# Patient Record
Sex: Male | Born: 1945 | Race: White | Hispanic: No | Marital: Married | Smoking: Former smoker
Health system: Southern US, Community
[De-identification: ages and names within clinical notes are randomized; demographics above are authoritative.]

## PROBLEM LIST (undated history)

## (undated) DIAGNOSIS — Z8619 Personal history of other infectious and parasitic diseases: Secondary | ICD-10-CM

## (undated) DIAGNOSIS — Z9289 Personal history of other medical treatment: Secondary | ICD-10-CM

## (undated) DIAGNOSIS — E7800 Pure hypercholesterolemia, unspecified: Secondary | ICD-10-CM

## (undated) DIAGNOSIS — K219 Gastro-esophageal reflux disease without esophagitis: Secondary | ICD-10-CM

## (undated) DIAGNOSIS — H3320 Serous retinal detachment, unspecified eye: Secondary | ICD-10-CM

## (undated) DIAGNOSIS — H35039 Hypertensive retinopathy, unspecified eye: Secondary | ICD-10-CM

## (undated) DIAGNOSIS — M1990 Unspecified osteoarthritis, unspecified site: Secondary | ICD-10-CM

## (undated) DIAGNOSIS — I10 Essential (primary) hypertension: Secondary | ICD-10-CM

## (undated) DIAGNOSIS — G459 Transient cerebral ischemic attack, unspecified: Secondary | ICD-10-CM

## (undated) DIAGNOSIS — I639 Cerebral infarction, unspecified: Secondary | ICD-10-CM

## (undated) HISTORY — PX: SHOULDER ARTHROSCOPY WITH OPEN ROTATOR CUFF REPAIR: SHX6092

## (undated) HISTORY — PX: EYE SURGERY: SHX253

## (undated) HISTORY — DX: Cerebral infarction, unspecified: I63.9

## (undated) HISTORY — DX: Serous retinal detachment, unspecified eye: H33.20

## (undated) HISTORY — DX: Hypertensive retinopathy, unspecified eye: H35.039

## (undated) HISTORY — DX: Personal history of other infectious and parasitic diseases: Z86.19

## (undated) HISTORY — PX: ESOPHAGOGASTRODUODENOSCOPY (EGD) WITH ESOPHAGEAL DILATION: SHX5812

## (undated) HISTORY — PX: CATARACT EXTRACTION W/ INTRAOCULAR LENS IMPLANT: SHX1309

## (undated) HISTORY — PX: BACK SURGERY: SHX140

## (undated) HISTORY — PX: TONSILLECTOMY: SUR1361

---

## 1994-11-14 DIAGNOSIS — K219 Gastro-esophageal reflux disease without esophagitis: Secondary | ICD-10-CM | POA: Insufficient documentation

## 1994-11-14 DIAGNOSIS — I10 Essential (primary) hypertension: Secondary | ICD-10-CM | POA: Insufficient documentation

## 1997-12-23 ENCOUNTER — Observation Stay (HOSPITAL_COMMUNITY): Admission: RE | Admit: 1997-12-23 | Discharge: 1997-12-24 | Payer: Self-pay | Admitting: Orthopedic Surgery

## 2001-09-17 DIAGNOSIS — J309 Allergic rhinitis, unspecified: Secondary | ICD-10-CM | POA: Insufficient documentation

## 2004-08-01 ENCOUNTER — Emergency Department: Payer: Self-pay | Admitting: Emergency Medicine

## 2005-08-22 ENCOUNTER — Ambulatory Visit: Payer: Self-pay | Admitting: General Surgery

## 2005-08-22 LAB — HM COLONOSCOPY: HM Colonoscopy: NORMAL

## 2006-09-26 DIAGNOSIS — M25559 Pain in unspecified hip: Secondary | ICD-10-CM | POA: Insufficient documentation

## 2007-03-28 ENCOUNTER — Ambulatory Visit: Payer: Self-pay | Admitting: Urology

## 2007-08-13 DIAGNOSIS — F40243 Fear of flying: Secondary | ICD-10-CM | POA: Insufficient documentation

## 2007-08-13 DIAGNOSIS — Z8249 Family history of ischemic heart disease and other diseases of the circulatory system: Secondary | ICD-10-CM | POA: Insufficient documentation

## 2009-06-21 ENCOUNTER — Ambulatory Visit: Payer: Self-pay | Admitting: Neurological Surgery

## 2010-08-22 ENCOUNTER — Emergency Department: Payer: Self-pay | Admitting: Emergency Medicine

## 2011-11-30 ENCOUNTER — Ambulatory Visit: Payer: Self-pay | Admitting: Gastroenterology

## 2011-12-28 ENCOUNTER — Ambulatory Visit: Payer: Self-pay | Admitting: Physical Medicine and Rehabilitation

## 2012-04-11 DIAGNOSIS — Z9289 Personal history of other medical treatment: Secondary | ICD-10-CM

## 2012-04-11 HISTORY — DX: Personal history of other medical treatment: Z92.89

## 2012-06-29 ENCOUNTER — Other Ambulatory Visit: Payer: Self-pay | Admitting: Neurological Surgery

## 2012-06-29 DIAGNOSIS — M549 Dorsalgia, unspecified: Secondary | ICD-10-CM

## 2012-07-05 ENCOUNTER — Ambulatory Visit
Admission: RE | Admit: 2012-07-05 | Discharge: 2012-07-05 | Disposition: A | Discharge status: Home / Self Care | Payer: 59 | Source: Ambulatory Visit | Attending: Neurological Surgery | Admitting: Neurological Surgery

## 2012-07-05 VITALS — BP 175/71 | HR 69 | Ht 68.0 in | Wt 195.0 lb

## 2012-07-05 DIAGNOSIS — M549 Dorsalgia, unspecified: Secondary | ICD-10-CM

## 2012-07-05 MED ORDER — IOHEXOL 180 MG/ML  SOLN
15.0000 mL | Freq: Once | INTRAMUSCULAR | Status: AC | PRN
  Administered 2012-07-05: 15 mL via INTRATHECAL

## 2012-07-05 MED ORDER — DIAZEPAM 5 MG PO TABS
10.0000 mg | ORAL_TABLET | Freq: Once | ORAL | Status: AC
  Administered 2012-07-05: 10 mg via ORAL

## 2012-07-23 ENCOUNTER — Encounter (HOSPITAL_COMMUNITY): Payer: Self-pay | Admitting: Respiratory Therapy

## 2012-07-23 ENCOUNTER — Other Ambulatory Visit: Payer: Self-pay | Admitting: Neurological Surgery

## 2012-07-24 ENCOUNTER — Ambulatory Visit (HOSPITAL_COMMUNITY)
Admission: RE | Admit: 2012-07-24 | Discharge: 2012-07-24 | Disposition: A | Discharge status: Home / Self Care | Payer: 59 | Source: Ambulatory Visit | Attending: Neurological Surgery | Admitting: Neurological Surgery

## 2012-07-24 ENCOUNTER — Encounter (HOSPITAL_COMMUNITY): Payer: Self-pay

## 2012-07-24 ENCOUNTER — Encounter (HOSPITAL_COMMUNITY)
Admission: RE | Admit: 2012-07-24 | Discharge: 2012-07-24 | Disposition: A | Discharge status: Home / Self Care | Payer: 59 | Source: Ambulatory Visit | Attending: Neurological Surgery | Admitting: Neurological Surgery

## 2012-07-24 DIAGNOSIS — I10 Essential (primary) hypertension: Secondary | ICD-10-CM | POA: Insufficient documentation

## 2012-07-24 DIAGNOSIS — Z01818 Encounter for other preprocedural examination: Secondary | ICD-10-CM | POA: Insufficient documentation

## 2012-07-24 DIAGNOSIS — Z01810 Encounter for preprocedural cardiovascular examination: Secondary | ICD-10-CM | POA: Insufficient documentation

## 2012-07-24 DIAGNOSIS — M479 Spondylosis, unspecified: Secondary | ICD-10-CM | POA: Insufficient documentation

## 2012-07-24 DIAGNOSIS — Z01812 Encounter for preprocedural laboratory examination: Secondary | ICD-10-CM | POA: Insufficient documentation

## 2012-07-24 HISTORY — DX: Unspecified osteoarthritis, unspecified site: M19.90

## 2012-07-24 HISTORY — DX: Essential (primary) hypertension: I10

## 2012-07-24 LAB — CBC
HCT: 42.2 % (ref 39.0–52.0)
Hemoglobin: 15.1 g/dL (ref 13.0–17.0)
MCH: 30.4 pg (ref 26.0–34.0)
MCHC: 35.8 g/dL (ref 30.0–36.0)
MCV: 84.9 fL (ref 78.0–100.0)
Platelets: 177 10*3/uL (ref 150–400)
RBC: 4.97 MIL/uL (ref 4.22–5.81)
RDW: 12.1 % (ref 11.5–15.5)
WBC: 7.9 10*3/uL (ref 4.0–10.5)

## 2012-07-24 LAB — BASIC METABOLIC PANEL
BUN: 18 mg/dL (ref 6–23)
CO2: 26 mEq/L (ref 19–32)
Calcium: 9.5 mg/dL (ref 8.4–10.5)
Chloride: 103 mEq/L (ref 96–112)
Creatinine, Ser: 1.1 mg/dL (ref 0.50–1.35)
GFR calc Af Amer: 80 mL/min — ABNORMAL LOW (ref >90)
GFR calc non Af Amer: 69 mL/min — ABNORMAL LOW (ref >90)
Glucose, Bld: 114 mg/dL — ABNORMAL HIGH (ref 70–99)
Potassium: 4.7 mEq/L (ref 3.5–5.1)
Sodium: 137 mEq/L (ref 135–145)

## 2012-07-24 LAB — SURGICAL PCR SCREEN
MRSA, PCR: NEGATIVE
Staphylococcus aureus: NEGATIVE

## 2012-07-24 NOTE — Pre-Procedure Instructions (Signed)
Garrett Green  07/24/2012   Your procedure is scheduled on:  Thursday  07/26/12   Report to [facility] at 530 AM.  Call this number if you have problems the morning of surgery: [phone]   Remember:   Do not eat food or drink liquids after midnight.   Take these medicines the morning of surgery with A SIP OF WATER:  NEURONTIN, HYDROCODONE IF NEEDED   Do not wear jewelry, make-up or nail polish.  Do not wear lotions, powders, or perfumes. You may wear deodorant.  Do not shave 48 hours prior to surgery. Men may shave face and neck.  Do not bring valuables to the hospital.  Contacts, dentures or bridgework may not be worn into surgery.  Leave suitcase in the car. After surgery it may be brought to your room.  For patients admitted to the hospital, checkout time is 11:00 AM the day of  discharge.   Patients discharged the day of surgery will not be allowed to drive  home.  Name and phone number of your driver:   Special Instructions: Shower using CHG 2 nights before surgery and the night before surgery.  If you shower the day of surgery use CHG.  Use special wash - you have one bottle of CHG for all showers.  You should use approximately 1/3 of the bottle for each shower.   Please read over the following fact sheets that you were given: Pain Booklet, Coughing and Deep Breathing, MRSA Information and Surgical Site Infection Prevention

## 2012-07-26 ENCOUNTER — Encounter (HOSPITAL_COMMUNITY)
Admission: RE | Disposition: A | Discharge status: Home / Self Care | Payer: Self-pay | Source: Ambulatory Visit | Attending: Neurological Surgery

## 2012-07-26 ENCOUNTER — Ambulatory Visit (HOSPITAL_COMMUNITY)
Admission: RE | Admit: 2012-07-26 | Discharge: 2012-07-26 | Disposition: A | Discharge status: Home / Self Care | Payer: 59 | Source: Ambulatory Visit | Attending: Neurological Surgery | Admitting: Neurological Surgery

## 2012-07-26 ENCOUNTER — Ambulatory Visit (HOSPITAL_COMMUNITY): Payer: 59 | Admitting: Certified Registered"

## 2012-07-26 ENCOUNTER — Encounter (HOSPITAL_COMMUNITY): Payer: Self-pay | Admitting: Certified Registered"

## 2012-07-26 ENCOUNTER — Encounter (HOSPITAL_COMMUNITY): Payer: Self-pay | Admitting: *Deleted

## 2012-07-26 ENCOUNTER — Ambulatory Visit (HOSPITAL_COMMUNITY): Payer: 59

## 2012-07-26 DIAGNOSIS — M5137 Other intervertebral disc degeneration, lumbosacral region: Secondary | ICD-10-CM | POA: Insufficient documentation

## 2012-07-26 DIAGNOSIS — Z9889 Other specified postprocedural states: Secondary | ICD-10-CM

## 2012-07-26 DIAGNOSIS — I10 Essential (primary) hypertension: Secondary | ICD-10-CM | POA: Insufficient documentation

## 2012-07-26 DIAGNOSIS — M48061 Spinal stenosis, lumbar region without neurogenic claudication: Secondary | ICD-10-CM | POA: Insufficient documentation

## 2012-07-26 DIAGNOSIS — M51379 Other intervertebral disc degeneration, lumbosacral region without mention of lumbar back pain or lower extremity pain: Secondary | ICD-10-CM | POA: Insufficient documentation

## 2012-07-26 HISTORY — PX: LUMBAR LAMINECTOMY/DECOMPRESSION MICRODISCECTOMY: SHX5026

## 2012-07-26 SURGERY — LUMBAR LAMINECTOMY/DECOMPRESSION MICRODISCECTOMY 1 LEVEL
Anesthesia: General | Site: Back | Laterality: Left | Wound class: Clean

## 2012-07-26 MED ORDER — BACITRACIN 50000 UNITS IM SOLR
INTRAMUSCULAR | Status: AC
  Filled 2012-07-26: qty 1

## 2012-07-26 MED ORDER — ACETAMINOPHEN 650 MG RE SUPP: 650.0000 mg | RECTAL | Status: DC | PRN

## 2012-07-26 MED ORDER — ACETAMINOPHEN 10 MG/ML IV SOLN
INTRAVENOUS | Status: AC
  Administered 2012-07-26: 1000 mg via INTRAVENOUS
  Filled 2012-07-26: qty 100

## 2012-07-26 MED ORDER — SODIUM CHLORIDE 0.9 % IR SOLN
Status: DC | PRN
  Administered 2012-07-26: 07:00:00

## 2012-07-26 MED ORDER — SODIUM CHLORIDE 0.9 % IV SOLN: 250.0000 mL | INTRAVENOUS | Status: DC

## 2012-07-26 MED ORDER — HYDROCODONE-ACETAMINOPHEN 5-325 MG PO TABS: 2.0000 | ORAL_TABLET | Freq: Four times a day (QID) | ORAL | Status: DC | PRN

## 2012-07-26 MED ORDER — HEMOSTATIC AGENTS (NO CHARGE) OPTIME
TOPICAL | Status: DC | PRN
  Administered 2012-07-26: 1 via TOPICAL

## 2012-07-26 MED ORDER — ACETAMINOPHEN 10 MG/ML IV SOLN
1000.0000 mg | Freq: Four times a day (QID) | INTRAVENOUS | Status: DC
  Administered 2012-07-26: 1000 mg via INTRAVENOUS
  Filled 2012-07-26 (×2): qty 100

## 2012-07-26 MED ORDER — MORPHINE SULFATE 2 MG/ML IJ SOLN: 1.0000 mg | INTRAMUSCULAR | Status: DC | PRN

## 2012-07-26 MED ORDER — MIDAZOLAM HCL 5 MG/5ML IJ SOLN
INTRAMUSCULAR | Status: DC | PRN
  Administered 2012-07-26: 2 mg via INTRAVENOUS

## 2012-07-26 MED ORDER — GABAPENTIN 300 MG PO CAPS
300.0000 mg | ORAL_CAPSULE | Freq: Three times a day (TID) | ORAL | Status: DC
  Filled 2012-07-26 (×2): qty 1

## 2012-07-26 MED ORDER — GLYCOPYRROLATE 0.2 MG/ML IJ SOLN
INTRAMUSCULAR | Status: DC | PRN
  Administered 2012-07-26: 0.6 mg via INTRAVENOUS

## 2012-07-26 MED ORDER — HYDROCODONE-ACETAMINOPHEN 5-325 MG PO TABS: 1.0000 | ORAL_TABLET | Freq: Four times a day (QID) | ORAL | Status: DC | PRN

## 2012-07-26 MED ORDER — DEXAMETHASONE SODIUM PHOSPHATE 4 MG/ML IJ SOLN: 4.0000 mg | Freq: Four times a day (QID) | INTRAMUSCULAR | Status: DC

## 2012-07-26 MED ORDER — NEOSTIGMINE METHYLSULFATE 1 MG/ML IJ SOLN
INTRAMUSCULAR | Status: DC | PRN
  Administered 2012-07-26: 4 mg via INTRAVENOUS

## 2012-07-26 MED ORDER — BUPIVACAINE HCL (PF) 0.25 % IJ SOLN
INTRAMUSCULAR | Status: DC | PRN
  Administered 2012-07-26: 3 mL

## 2012-07-26 MED ORDER — ACETAMINOPHEN 325 MG PO TABS: 650.0000 mg | ORAL_TABLET | ORAL | Status: DC | PRN

## 2012-07-26 MED ORDER — LIDOCAINE HCL (CARDIAC) 20 MG/ML IV SOLN
INTRAVENOUS | Status: DC | PRN
  Administered 2012-07-26: 100 mg via INTRAVENOUS

## 2012-07-26 MED ORDER — HYDROMORPHONE HCL PF 1 MG/ML IJ SOLN
INTRAMUSCULAR | Status: AC
  Filled 2012-07-26: qty 1

## 2012-07-26 MED ORDER — OXYCODONE HCL 5 MG/5ML PO SOLN: 5.0000 mg | Freq: Once | ORAL | Status: DC | PRN

## 2012-07-26 MED ORDER — ONDANSETRON HCL 4 MG/2ML IJ SOLN: 4.0000 mg | INTRAMUSCULAR | Status: DC | PRN

## 2012-07-26 MED ORDER — MENTHOL 3 MG MT LOZG: 1.0000 | LOZENGE | OROMUCOSAL | Status: DC | PRN

## 2012-07-26 MED ORDER — RAMIPRIL 10 MG PO CAPS
10.0000 mg | ORAL_CAPSULE | Freq: Every day | ORAL | Status: DC
  Administered 2012-07-26: 10 mg via ORAL
  Filled 2012-07-26: qty 1

## 2012-07-26 MED ORDER — ROCURONIUM BROMIDE 100 MG/10ML IV SOLN
INTRAVENOUS | Status: DC | PRN
  Administered 2012-07-26: 50 mg via INTRAVENOUS

## 2012-07-26 MED ORDER — POTASSIUM CHLORIDE IN NACL 20-0.9 MEQ/L-% IV SOLN
INTRAVENOUS | Status: DC
  Filled 2012-07-26 (×2): qty 1000

## 2012-07-26 MED ORDER — DEXAMETHASONE 4 MG PO TABS
4.0000 mg | ORAL_TABLET | Freq: Four times a day (QID) | ORAL | Status: DC
  Administered 2012-07-26: 4 mg via ORAL
  Filled 2012-07-26: qty 1

## 2012-07-26 MED ORDER — THROMBIN 5000 UNITS EX SOLR
CUTANEOUS | Status: DC | PRN
  Administered 2012-07-26 (×3): 5000 [IU] via TOPICAL

## 2012-07-26 MED ORDER — CEFAZOLIN SODIUM-DEXTROSE 2-3 GM-% IV SOLR
INTRAVENOUS | Status: AC
  Administered 2012-07-26: 2 g via INTRAVENOUS
  Filled 2012-07-26: qty 50

## 2012-07-26 MED ORDER — FENTANYL CITRATE 0.05 MG/ML IJ SOLN
INTRAMUSCULAR | Status: DC | PRN
  Administered 2012-07-26 (×2): 100 ug via INTRAVENOUS

## 2012-07-26 MED ORDER — CYCLOBENZAPRINE HCL 10 MG PO TABS
10.0000 mg | ORAL_TABLET | Freq: Three times a day (TID) | ORAL | Status: DC | PRN
  Filled 2012-07-26: qty 1

## 2012-07-26 MED ORDER — LACTATED RINGERS IV SOLN
INTRAVENOUS | Status: DC | PRN
  Administered 2012-07-26 (×2): via INTRAVENOUS

## 2012-07-26 MED ORDER — DEXAMETHASONE SODIUM PHOSPHATE 4 MG/ML IJ SOLN
INTRAMUSCULAR | Status: DC | PRN
  Administered 2012-07-26: 8 mg via INTRAVENOUS

## 2012-07-26 MED ORDER — OXYCODONE HCL 5 MG PO TABS
10.0000 mg | ORAL_TABLET | ORAL | Status: DC | PRN
  Administered 2012-07-26: 10 mg via ORAL
  Filled 2012-07-26: qty 2

## 2012-07-26 MED ORDER — PROPOFOL 10 MG/ML IV BOLUS
INTRAVENOUS | Status: DC | PRN
  Administered 2012-07-26: 50 mg via INTRAVENOUS
  Administered 2012-07-26: 150 mg via INTRAVENOUS

## 2012-07-26 MED ORDER — HYDROMORPHONE HCL PF 1 MG/ML IJ SOLN
0.2500 mg | INTRAMUSCULAR | Status: DC | PRN
  Administered 2012-07-26 (×2): 0.5 mg via INTRAVENOUS

## 2012-07-26 MED ORDER — 0.9 % SODIUM CHLORIDE (POUR BTL) OPTIME
TOPICAL | Status: DC | PRN
  Administered 2012-07-26: 1000 mL

## 2012-07-26 MED ORDER — PROMETHAZINE HCL 25 MG/ML IJ SOLN: 6.2500 mg | INTRAMUSCULAR | Status: DC | PRN

## 2012-07-26 MED ORDER — PHENOL 1.4 % MT LIQD: 1.0000 | OROMUCOSAL | Status: DC | PRN

## 2012-07-26 MED ORDER — THROMBIN 5000 UNITS EX SOLR
CUTANEOUS | Status: DC | PRN
  Administered 2012-07-26: 09:00:00 via TOPICAL

## 2012-07-26 MED ORDER — SODIUM CHLORIDE 0.9 % IV SOLN
INTRAVENOUS | Status: AC
  Filled 2012-07-26: qty 500

## 2012-07-26 MED ORDER — CEFAZOLIN SODIUM 1-5 GM-% IV SOLN
1.0000 g | Freq: Three times a day (TID) | INTRAVENOUS | Status: DC
  Filled 2012-07-26 (×2): qty 50

## 2012-07-26 MED ORDER — LIDOCAINE HCL 4 % MT SOLN
OROMUCOSAL | Status: DC | PRN
  Administered 2012-07-26: 4 mL via TOPICAL

## 2012-07-26 MED ORDER — OXYCODONE HCL 5 MG PO TABS: 5.0000 mg | ORAL_TABLET | Freq: Once | ORAL | Status: DC | PRN

## 2012-07-26 MED ORDER — ZOLPIDEM TARTRATE 5 MG PO TABS: 10.0000 mg | ORAL_TABLET | Freq: Every evening | ORAL | Status: DC | PRN

## 2012-07-26 MED ORDER — ONDANSETRON HCL 4 MG/2ML IJ SOLN
INTRAMUSCULAR | Status: DC | PRN
  Administered 2012-07-26: 4 mg via INTRAVENOUS

## 2012-07-26 MED ORDER — SODIUM CHLORIDE 0.9 % IJ SOLN: 3.0000 mL | INTRAMUSCULAR | Status: DC | PRN

## 2012-07-26 MED ORDER — SODIUM CHLORIDE 0.9 % IJ SOLN: 3.0000 mL | Freq: Two times a day (BID) | INTRAMUSCULAR | Status: DC

## 2012-07-26 SURGICAL SUPPLY — 49 items
ADH SKN CLS APL DERMABOND .7 (GAUZE/BANDAGES/DRESSINGS) ×1
APL SKNCLS STERI-STRIP NONHPOA (GAUZE/BANDAGES/DRESSINGS) ×1
BAG DECANTER FOR FLEXI CONT (MISCELLANEOUS) ×2 IMPLANT
BENZOIN TINCTURE PRP APPL 2/3 (GAUZE/BANDAGES/DRESSINGS) ×2 IMPLANT
BUR MATCHSTICK NEURO 3.0 LAGG (BURR) ×2 IMPLANT
CANISTER SUCTION 2500CC (MISCELLANEOUS) ×2 IMPLANT
CLOTH BEACON ORANGE TIMEOUT ST (SAFETY) ×2 IMPLANT
CONT SPEC 4OZ CLIKSEAL STRL BL (MISCELLANEOUS) ×2 IMPLANT
DERMABOND ADVANCED (GAUZE/BANDAGES/DRESSINGS) ×1
DERMABOND ADVANCED .7 DNX12 (GAUZE/BANDAGES/DRESSINGS) IMPLANT
DRAPE LAPAROTOMY 100X72X124 (DRAPES) ×2 IMPLANT
DRAPE MICROSCOPE ZEISS OPMI (DRAPES) ×2 IMPLANT
DRAPE POUCH INSTRU U-SHP 10X18 (DRAPES) ×2 IMPLANT
DRAPE SURG 17X23 STRL (DRAPES) ×2 IMPLANT
DRESSING TELFA 8X3 (GAUZE/BANDAGES/DRESSINGS) ×2 IMPLANT
DRSG OPSITE 4X5.5 SM (GAUZE/BANDAGES/DRESSINGS) ×2 IMPLANT
DURAPREP 26ML APPLICATOR (WOUND CARE) ×2 IMPLANT
ELECT REM PT RETURN 9FT ADLT (ELECTROSURGICAL) ×2
ELECTRODE REM PT RTRN 9FT ADLT (ELECTROSURGICAL) ×1 IMPLANT
GAUZE SPONGE 4X4 16PLY XRAY LF (GAUZE/BANDAGES/DRESSINGS) IMPLANT
GLOVE BIO SURGEON STRL SZ7.5 (GLOVE) ×4 IMPLANT
GLOVE BIO SURGEON STRL SZ8 (GLOVE) ×2 IMPLANT
GLOVE BIOGEL M 8.0 STRL (GLOVE) ×2 IMPLANT
GLOVE ECLIPSE 7.5 STRL STRAW (GLOVE) ×2 IMPLANT
GLOVE INDICATOR 7.5 STRL GRN (GLOVE) ×1 IMPLANT
GLOVE INDICATOR 8.0 STRL GRN (GLOVE) ×2 IMPLANT
GOWN BRE IMP SLV AUR LG STRL (GOWN DISPOSABLE) IMPLANT
GOWN BRE IMP SLV AUR XL STRL (GOWN DISPOSABLE) ×3 IMPLANT
GOWN STRL REIN 2XL LVL4 (GOWN DISPOSABLE) IMPLANT
HEMOSTAT POWDER KIT SURGIFOAM (HEMOSTASIS) ×1 IMPLANT
KIT BASIN OR (CUSTOM PROCEDURE TRAY) ×2 IMPLANT
KIT ROOM TURNOVER OR (KITS) ×2 IMPLANT
NDL HYPO 25X1 1.5 SAFETY (NEEDLE) ×1 IMPLANT
NEEDLE HYPO 25X1 1.5 SAFETY (NEEDLE) ×2 IMPLANT
NEEDLE SPNL 20GX3.5 QUINCKE YW (NEEDLE) ×2 IMPLANT
NS IRRIG 1000ML POUR BTL (IV SOLUTION) ×2 IMPLANT
PACK LAMINECTOMY NEURO (CUSTOM PROCEDURE TRAY) ×2 IMPLANT
PAD ARMBOARD 7.5X6 YLW CONV (MISCELLANEOUS) ×6 IMPLANT
RUBBERBAND STERILE (MISCELLANEOUS) ×4 IMPLANT
SPONGE SURGIFOAM ABS GEL SZ50 (HEMOSTASIS) ×4 IMPLANT
STRIP CLOSURE SKIN 1/2X4 (GAUZE/BANDAGES/DRESSINGS) ×2 IMPLANT
SUT VIC AB 0 CT1 18XCR BRD8 (SUTURE) ×1 IMPLANT
SUT VIC AB 0 CT1 8-18 (SUTURE) ×2
SUT VIC AB 2-0 CP2 18 (SUTURE) ×2 IMPLANT
SUT VIC AB 3-0 SH 8-18 (SUTURE) ×2 IMPLANT
SYR 20ML ECCENTRIC (SYRINGE) ×2 IMPLANT
TOWEL OR 17X24 6PK STRL BLUE (TOWEL DISPOSABLE) ×2 IMPLANT
TOWEL OR 17X26 10 PK STRL BLUE (TOWEL DISPOSABLE) ×2 IMPLANT
WATER STERILE IRR 1000ML POUR (IV SOLUTION) ×2 IMPLANT

## 2012-07-26 NOTE — Anesthesia Procedure Notes (Signed)
Procedure Name: Intubation Date/Time: 07/26/2012 7:43 AM Performed by: Margaree Mackintosh Pre-anesthesia Checklist: Patient identified, Timeout performed, Emergency Drugs available, Suction available and Patient being monitored Patient Re-evaluated:Patient Re-evaluated prior to inductionOxygen Delivery Method: Circle system utilized Preoxygenation: Pre-oxygenation with 100% oxygen Intubation Type: IV induction Ventilation: Mask ventilation without difficulty Laryngoscope Size: Mac and 3 Grade View: Grade I Tube type: Oral Tube size: 7.5 mm Number of attempts: 1 Airway Equipment and Method: Stylet and LTA kit utilized Placement Confirmation: ETT inserted through vocal cords under direct vision,  positive ETCO2 and breath sounds checked- equal and bilateral Secured at: 22 cm Tube secured with: Tape Dental Injury: Teeth and Oropharynx as per pre-operative assessment

## 2012-07-26 NOTE — Progress Notes (Signed)
Pt given D/C instructions with Rx, verbal understanding given. Pt D/C'd home via wheelchair @ 1610 per MD order. Rema Fendt, RN

## 2012-07-26 NOTE — Anesthesia Postprocedure Evaluation (Signed)
Anesthesia Post Note  Patient: Garrett Green  Procedure(s) Performed: Procedure(s) (LRB): LUMBAR LAMINECTOMY/DECOMPRESSION MICRODISCECTOMY 1 LEVEL (Left)  Anesthesia type: general  Patient location: PACU  Post pain: Pain level controlled  Post assessment: Patient's Cardiovascular Status Stable  Last Vitals:  Filed Vitals:   07/26/12 0955  BP: 132/82  Pulse: 53  Temp:   Resp: 24    Post vital signs: Reviewed and stable  Level of consciousness: sedated  Complications: No apparent anesthesia complications

## 2012-07-26 NOTE — Discharge Summary (Signed)
Physician Discharge Summary  Patient ID: Garrett Green MRN: [id number] DOB/AGE: 06/26/1945 66 y.o.  Admit date: 07/26/2012 Discharge date: 07/26/2012  Admission Diagnoses: Lumbar spinal stenosis with leg pain    Discharge Diagnoses: Same   Discharged Condition: good  Hospital Course: The patient was admitted on 07/26/2012 and taken to the operating room where the patient underwent left L5-S1 hemilaminectomy. The patient tolerated the procedure well and was taken to the recovery room and then to the floor in stable condition. The hospital course was routine. There were no complications. The wound remained clean dry and intact. Pt had appropriate back soreness. No complaints of leg pain or new N/T/W. The patient remained afebrile with stable vital signs, and tolerated a regular diet. The patient continued to increase activities, and pain was well controlled with oral pain medications.   Consults: None  Significant Diagnostic Studies:  Results for orders placed during the hospital encounter of 07/24/12  SURGICAL PCR SCREEN      Result Value Range   MRSA, PCR NEGATIVE  NEGATIVE   Staphylococcus aureus NEGATIVE  NEGATIVE  CBC      Result Value Range   WBC 7.9  4.0 - 10.5 K/uL   RBC 4.97  4.22 - 5.81 MIL/uL   Hemoglobin 15.1  13.0 - 17.0 g/dL   HCT 40.9  81.1 - 91.4 %   MCV 84.9  78.0 - 100.0 fL   MCH 30.4  26.0 - 34.0 pg   MCHC 35.8  30.0 - 36.0 g/dL   RDW 78.2  95.6 - 21.3 %   Platelets 177  150 - 400 K/uL  BASIC METABOLIC PANEL      Result Value Range   Sodium 137  135 - 145 mEq/L   Potassium 4.7  3.5 - 5.1 mEq/L   Chloride 103  96 - 112 mEq/L   CO2 26  19 - 32 mEq/L   Glucose, Bld 114 (*) 70 - 99 mg/dL   BUN 18  6 - 23 mg/dL   Creatinine, Ser 0.86  0.50 - 1.35 mg/dL   Calcium 9.5  8.4 - 57.8 mg/dL   GFR calc non Af Amer 69 (*) >90 mL/min   GFR calc Af Amer 80 (*) >90 mL/min    Dg Chest 2 View  07/24/2012  *RADIOLOGY REPORT*  Clinical Data: Preoperative evaluation.   For lumbar surgery. History of hypertension.  CHEST - 2 VIEW  Comparison: None.  Findings: The cardiac silhouette is normal size and shape. Mediastinal and hilar contours appear normal.  No pulmonary infiltrates or masses are seen. No pleural abnormality is evident. There is age compatible degenerative spondylosis.  IMPRESSION: No acute or active cardiopulmonary or pleural abnormalities.   Original Report Authenticated By: Onalee Hua Call    Ct Lumbar Spine W Contrast  07/05/2012  *RADIOLOGY REPORT*  Clinical Data:  Back pain  CT MYELOGRAPHY LUMBAR SPINE  Technique:  CT imaging of the lumbar spine was performed after intrathecal contrast administration.  Multiplanar CT image reconstructions were also generated.  Comparison:   None.  Findings: Radiologist injection.  4 mm retrolisthesis L5 upon S1.  No vertebral compression deformity.  Conus medullaris terminates at mid L1.  L1-L2:  Minimal concentric bulge.  No stenosis.  L2-L3:  Moderate concentric bulge asymmetric to the right.  No central stenosis.  Mild narrowing of the right lateral recess. Patent foramina.  L3-4:  Moderate concentric bulge with mild central stenosis and significant right lateral recess narrowing.  Right L4 nerve root  encroachment.  Mild facet arthropathy.  Moderate bilateral foraminal narrowing is multifactorial.  L4-5:  Mild concentric bulge.  No significant central or lateral recess stenosis.  Moderate bilateral facet arthropathy.  Moderate bilateral foraminal narrowing is multifactorial.  L5-S1:  Severe bilateral facet arthropathy.  There is anterolisthesis but no significant disc herniation.   The right lateral recess is widely patent.  Anterior facet osteophytes cause significant left lateral recess narrowing and S1 nerve root effacement.  Moderate bilateral foraminal narrowing is multifactorial.  IMPRESSION: Multilevel degenerative disc disease as described.  Mild right lateral recess narrowing at L2-3.  Mild central stenosis at L3-4 with  significant right lateral recess narrowing.  Right L4 nerve root encroachment can occur at this level.  Left S1 nerve encroachment at L5-S1 secondary to facet osteophytes. There is severe facet arthropathy at this level bilaterally.  Bilateral foraminal narrowing in the mid and lower lumbar spine as described.   Original Report Authenticated By: Jolaine Click, M.D.    Dg Myelogram Lumbar  07/05/2012  *RADIOLOGY REPORT*  Clinical Data:  Back pain  MYELOGRAM LUMBAR  Technique: The procedure, risks, benefits, and alternatives were explained to the patient. The patient understands and consents. Under fluoroscopic guidance, a 22 gauge spinal needle was placed in the CSF space via right L2-3 approach. 20 mL of Omnipaque 180 was injected.  Findings: There is a 5 mm anterolisthesis L5 upon S1.  Minimal narrowing at L5-S1 and L2-3.  No vertebral compression deformity.  L1-2:  Minimal anterior epidural mass effect.  No spinal stenosis  L2-3:  Minimal anterior epidural mass effect secondary to disc osteophytes.  No significant spinal stenosis.  L3-4:  Posterior osteophytes and disc bulge result and moderate anterior epidural mass effect.  With ligamentum flavum buckling, there is a degree of spinal stenosis.  L4-5:  Mild anterior epidural mass effect without significant spinal stenosis.  L5-S1:  Minimal anterolisthesis but no spinal stenosis.  Flexion and extension views are performed.  Anterolisthesis at L5- S1 increases from 5-6 mm between extension and flexion.  Complications: None.  Fluoroscopy Time: 57 seconds.  IMPRESSION: Multilevel degenerative disc disease.  There is an element of spinal stenosis at L3-4.  There is slight movement at L5-S1 between extension and flexion as described.  Anterolisthesis increases from 5-6 mm.   Original Report Authenticated By: Jolaine Click, M.D.    Dg Lumbar Spine 1 View  07/26/2012  *RADIOLOGY REPORT*  Clinical Data: L5-S1 microdiskectomy.  Localization.  LUMBAR SPINE - 1 VIEW   Comparison: CT myelogram of 07/05/2012.  Findings: Single lateral view, labeled 0800 hours.  This demonstrates a surgical device projecting posterior to the lumbosacral junction.  IMPRESSION: Intraoperative localization of the lumbosacral junction.   Original Report Authenticated By: Jeronimo Greaves, M.D.     Antibiotics:  Anti-infectives   Start     Dose/Rate Route Frequency Ordered Stop   07/26/12 1545  ceFAZolin (ANCEF) IVPB 1 g/50 mL premix     1 g 100 mL/hr over 30 Minutes Intravenous Every 8 hours 07/26/12 1052 07/27/12 0744   07/26/12 0718  ceFAZolin (ANCEF) 2-3 GM-% IVPB SOLR    Comments:  BUNN, ALISHA: cabinet override      07/26/12 0718 07/26/12 0746   07/26/12 0717  bacitracin 50,000 Units in sodium chloride irrigation 0.9 % 500 mL irrigation  Status:  Discontinued       As needed 07/26/12 0817 07/26/12 0906   07/26/12 0711  bacitracin 16109 UNITS injection    Comments:  RATCLIFF, ESTHER:  cabinet override      07/26/12 0711 07/26/12 1914      Discharge Exam: Blood pressure 167/83, pulse 58, temperature 97.5 F (36.4 C), temperature source Oral, resp. rate 18, SpO2 98.00%. Neurologic: Grossly normal Incision clean dry and intact  Discharge Medications:     Medication List    TAKE these medications       aspirin 81 MG tablet  Take 81 mg by mouth daily.     Fish Oil 1200 MG Caps  Take 1,200 mg by mouth every other day.     fluticasone 50 MCG/ACT nasal spray  Commonly known as:  FLONASE  Place 2 sprays into the nose daily.     gabapentin 300 MG capsule  Commonly known as:  NEURONTIN  Take 300 mg by mouth 3 (three) times daily.     HYDROcodone-acetaminophen 5-325 MG per tablet  Commonly known as:  NORCO/VICODIN  Take 2 tablets by mouth every 6 (six) hours as needed for pain.     loratadine 10 MG tablet  Commonly known as:  CLARITIN  Take 10 mg by mouth daily as needed for allergies.     ramipril 10 MG capsule  Commonly known as:  ALTACE  Take 10 mg by mouth  daily.     zolpidem 10 MG tablet  Commonly known as:  AMBIEN  Take 10 mg by mouth at bedtime as needed for sleep.        Disposition: Home   Final Dx: Left L5-S1 hemilaminectomy      Discharge Orders   Future Orders Complete By Expires     Call MD for:  difficulty breathing, headache or visual disturbances  As directed     Call MD for:  persistant nausea and vomiting  As directed     Call MD for:  redness, tenderness, or signs of infection (pain, swelling, redness, odor or green/yellow discharge around incision site)  As directed     Call MD for:  severe uncontrolled pain  As directed     Call MD for:  temperature >100.4  As directed     Diet - low sodium heart healthy  As directed     Discharge instructions  As directed     Comments:      No bending, twisting, or heavy lifting. No strenuous activity. No driving for 1 week    Increase activity slowly  As directed     Remove dressing in 48 hours  As directed           Signed: Esau Fridman,Bon S 07/26/2012, 12:17 PM

## 2012-07-26 NOTE — Anesthesia Preprocedure Evaluation (Addendum)
Anesthesia Evaluation  Patient identified by MRN, date of birth, ID band Patient awake    Reviewed: Allergy & Precautions, H&P , NPO status , Patient's Chart, lab work & pertinent test results  History of Anesthesia Complications Negative for: history of anesthetic complications  Airway Mallampati: II TM Distance: >3 FB Neck ROM: Full    Dental  (+) Teeth Intact and Dental Advisory Given   Pulmonary neg pulmonary ROS, former smoker,    Pulmonary exam normal       Cardiovascular hypertension, Pt. on medications     Neuro/Psych negative psych ROS   GI/Hepatic negative GI ROS, Neg liver ROS,   Endo/Other  negative endocrine ROS  Renal/GU negative Renal ROS     Musculoskeletal   Abdominal   Peds  Hematology   Anesthesia Other Findings   Reproductive/Obstetrics                          Anesthesia Physical Anesthesia Plan  ASA: II  Anesthesia Plan: General   Post-op Pain Management:    Induction: Intravenous  Airway Management Planned: Oral ETT  Additional Equipment:   Intra-op Plan:   Post-operative Plan: Extubation in OR  Informed Consent: I have reviewed the patients History and Physical, chart, labs and discussed the procedure including the risks, benefits and alternatives for the proposed anesthesia with the patient or authorized representative who has indicated his/her understanding and acceptance.   Dental advisory given  Plan Discussed with: Anesthesiologist, CRNA and Surgeon  Anesthesia Plan Comments:        Anesthesia Quick Evaluation

## 2012-07-26 NOTE — H&P (Signed)
Subjective: Patient is a 67 y.o. male admitted for L L5-S1 hemilam for stenosis. Onset of symptoms was months ago, gradually worse since that time.  The pain is rated severe, and is located at the low back and radiates to LLE. The pain is described as aching and sharp and occurs all day. The symptoms have been progressive. Symptoms are exacerbated by sitting. MRI or CT showed L L5-S1 stenosis.  Past Medical History  Diagnosis Date  . Hypertension   . Arthritis     Past Surgical History  Procedure Laterality Date  . Shoulder surgery      LEFT 12+ YEARS   . Esophageal dilation      Prior to Admission medications   Medication Sig Start Date End Date Taking? Authorizing Provider  aspirin 81 MG tablet Take 81 mg by mouth daily.   Yes Historical Provider, MD  fluticasone (FLONASE) 50 MCG/ACT nasal spray Place 2 sprays into the nose daily.   Yes Historical Provider, MD  gabapentin (NEURONTIN) 300 MG capsule Take 300 mg by mouth 3 (three) times daily.   Yes Historical Provider, MD  HYDROcodone-acetaminophen (NORCO/VICODIN) 5-325 MG per tablet Take 1 tablet by mouth every 6 (six) hours as needed for pain.   Yes Historical Provider, MD  loratadine (CLARITIN) 10 MG tablet Take 10 mg by mouth daily as needed for allergies.   Yes Historical Provider, MD  Omega-3 Fatty Acids (FISH OIL) 1200 MG CAPS Take 1,200 mg by mouth every other day.   Yes Historical Provider, MD  ramipril (ALTACE) 10 MG capsule Take 10 mg by mouth daily.   Yes Historical Provider, MD  zolpidem (AMBIEN) 10 MG tablet Take 10 mg by mouth at bedtime as needed for sleep.   Yes Historical Provider, MD   No Known Allergies  History  Substance Use Topics  . Smoking status: Former Smoker -- 1.00 packs/day for 15 years  . Smokeless tobacco: Not on file     Comment: stopped smoking around age 89  . Alcohol Use: Yes     Comment: OCC     History reviewed. No pertinent family history.   Review of Systems  Positive ROS: neg  All  other systems have been reviewed and were otherwise negative with the exception of those mentioned in the HPI and as above.  Objective: Vital signs in last 24 hours: Temp:  [97.9 F (36.6 C)] 97.9 F (36.6 C) (04/17 0559) Pulse Rate:  [64] 64 (04/17 0559) Resp:  [20] 20 (04/17 0559) BP: (132)/(79) 132/79 mmHg (04/17 0559) SpO2:  [99 %] 99 % (04/17 0559)  General Appearance: Alert, cooperative, no distress, appears stated age Head: Normocephalic, without obvious abnormality, atraumatic Eyes: PERRL, conjunctiva/corneas clear     Back: Symmetric, no curvature, ROM normal Lungs: respirations unlabored Heart: Regular rate and rhythm Abdomen: Soft, non-tender Extremities: Extremities normal, atraumatic, no cyanosis or edema Pulses: 2+ and symmetric all extremities  NEUROLOGIC:   Mental status: Alert and oriented x4,  no aphasia, good attention span, fund of knowledge, and memory Motor Exam - grossly normal Sensory Exam - grossly normal Reflexes: 1+ Coordination - grossly normal Gait - grossly normal Balance - grossly normal Cranial Nerves: I: smell Not tested  II: visual acuity  OS: nl    OD: nl  II: visual fields Full to confrontation  II: pupils Equal, round, reactive to light  III,VII: ptosis None  III,IV,VI: extraocular muscles  Full ROM  V: mastication Normal  V: facial light touch sensation  Normal  V,VII: corneal reflex  Present  VII: facial muscle function - upper  Normal  VII: facial muscle function - lower Normal  VIII: hearing Not tested  IX: soft palate elevation  Normal  IX,X: gag reflex Present  XI: trapezius strength  5/5  XI: sternocleidomastoid strength 5/5  XI: neck flexion strength  5/5  XII: tongue strength  Normal    Data Review Lab Results  Component Value Date   WBC 7.9 07/24/2012   HGB 15.1 07/24/2012   HCT 42.2 07/24/2012   MCV 84.9 07/24/2012   PLT 177 07/24/2012   Lab Results  Component Value Date   NA 137 07/24/2012   K 4.7 07/24/2012    CL 103 07/24/2012   CO2 26 07/24/2012   BUN 18 07/24/2012   CREATININE 1.10 07/24/2012   GLUCOSE 114* 07/24/2012   No results found for this basename: INR, PROTIME    Assessment/Plan: Patient admitted for L L5-S1 hemilam for stenosis. Patient has failed conservative therapy.  I explained the condition and procedure to the patient and answered any questions.  Patient wishes to proceed with procedure as planned. Understands risks/ benefits and typical outcomes of procedure.   Kaylee Wombles,Adain S 07/26/2012 6:46 AM

## 2012-07-26 NOTE — Op Note (Signed)
07/26/2012  9:03 AM  PATIENT:  Garrett Green  67 y.o. 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 He had a CT myelogram which showed severe left lateral recess stenosis under filling of the left S1 and potentially L5 nerve roots. There was a large osteophyte, off the medial facet. Recommended a decompressive hemilaminectomy once he failed medical management. Patient understood the risks, benefits, and alternatives and potential outcomes and wished to proceed.  PROCEDURE DETAILS: The patient was taken to the operating room and after induction of adequate generalized endotracheal anesthesia, the patient was rolled into the prone position on the Wilson frame and all pressure points were padded. The lumbar region was cleaned and then prepped with DuraPrep and draped in the usual sterile fashion. 5 cc of local anesthesia was injected and then a dorsal midline incision was made and carried down to the lumbo sacral fascia. The fascia was opened and the paraspinous musculature was taken down in a subperiosteal fashion to expose L5-S1 on the left. Intraoperative x-ray confirmed my level, and then I used a combination of the high-speed drill and the Kerrison punches to perform a hemilaminectomy, medial facetectomy, and foraminotomy at L5-S1 on the left. The underlying yellow ligament was opened and removed in a piecemeal fashion to expose the underlying dura and exiting nerve root. I undercut  the lateral recess and dissected down until I was medial to and distal to the pedicle. There was a large osteophyte coming off the medial facet causing significant compression of both the L5 and the S1 nerve roots on the left. I decompressed the lateral recess by removing this large osteophyte and to both the L5 and the S1 nerve roots were identified. A performed a generous foraminotomies to decompress both L5 and S1 nerve roots.The nerve root was well decompressed. We then gently retracted the nerve root medially with a retractor, coagulated the epidural venous vasculature, and inspected the disc space. I found no significant disc herniation . Marland Kitchen I then palpated with a coronary dilator along the nerve roots and into the foramen to assure adequate decompression. I felt no more compression of the nerve roots. I irrigated with saline solution containing bacitracin. Achieved hemostasis with bipolar cautery, lined the dura with Gelfoam, and then closed the fascia with 0 Vicryl. I closed the subcutaneous tissues with 2-0 Vicryl and the subcuticular tissues with 3-0 Vicryl. The skin was then closed with benzoin and Steri-Strips. The drapes were removed, a sterile dressing was applied. The patient was awakened from general anesthesia and transferred to the recovery room in stable condition. At the end of the procedure all sponge, needle and instrument counts were correct.   PLAN OF CARE: Admit for overnight observation  PATIENT DISPOSITION:  PACU - hemodynamically stable.   Delay start of Pharmacological VTE agent (>24hrs) due to surgical blood loss or risk of bleeding:  yes

## 2012-07-26 NOTE — Preoperative (Signed)
Beta Blockers   Reason not to administer Beta Blockers:Not Applicable 

## 2012-07-26 NOTE — Addendum Note (Signed)
Addendum created 07/26/12 1039 by Margaree Mackintosh, CRNA   Modules edited: Anesthesia Medication Administration

## 2012-07-26 NOTE — Transfer of Care (Signed)
Immediate Anesthesia Transfer of Care Note  Patient: Garrett Green  Procedure(s) Performed: Procedure(s) with comments: LUMBAR LAMINECTOMY/DECOMPRESSION MICRODISCECTOMY 1 LEVEL (Left) - lumbar five sacral one  Patient Location: PACU  Anesthesia Type:General  Level of Consciousness: awake, alert  and oriented  Airway & Oxygen Therapy: Patient Spontanous Breathing and Patient connected to nasal cannula oxygen  Post-op Assessment: Report given to PACU RN, Post -op Vital signs reviewed and stable and Patient moving all extremities  Post vital signs: Reviewed and stable  Complications: No apparent anesthesia complications

## 2012-07-26 NOTE — Progress Notes (Signed)
Notified Dr. Yetta Barre that he needs to sign his orders for this pt.

## 2012-07-31 ENCOUNTER — Encounter (HOSPITAL_COMMUNITY): Payer: Self-pay | Admitting: Neurological Surgery

## 2012-09-06 ENCOUNTER — Other Ambulatory Visit: Payer: Self-pay | Admitting: Neurological Surgery

## 2012-09-06 DIAGNOSIS — M549 Dorsalgia, unspecified: Secondary | ICD-10-CM

## 2012-09-10 ENCOUNTER — Ambulatory Visit
Admission: RE | Admit: 2012-09-10 | Discharge: 2012-09-10 | Disposition: A | Discharge status: Home / Self Care | Payer: 59 | Source: Ambulatory Visit | Attending: Neurological Surgery | Admitting: Neurological Surgery

## 2012-09-10 VITALS — BP 124/72 | HR 62

## 2012-09-10 DIAGNOSIS — M549 Dorsalgia, unspecified: Secondary | ICD-10-CM

## 2012-09-10 MED ORDER — DIAZEPAM 5 MG PO TABS
10.0000 mg | ORAL_TABLET | Freq: Once | ORAL | Status: AC
  Administered 2012-09-10: 10 mg via ORAL

## 2012-09-10 MED ORDER — IOHEXOL 180 MG/ML  SOLN
15.0000 mL | Freq: Once | INTRAMUSCULAR | Status: AC | PRN
  Administered 2012-09-10: 15 mL via INTRATHECAL

## 2012-09-18 ENCOUNTER — Other Ambulatory Visit: Payer: Self-pay | Admitting: Neurological Surgery

## 2012-10-03 ENCOUNTER — Encounter (HOSPITAL_COMMUNITY): Payer: Self-pay | Admitting: Pharmacy Technician

## 2012-10-09 ENCOUNTER — Encounter (HOSPITAL_COMMUNITY): Payer: Self-pay

## 2012-10-09 ENCOUNTER — Encounter (HOSPITAL_COMMUNITY)
Admission: RE | Admit: 2012-10-09 | Discharge: 2012-10-09 | Disposition: A | Discharge status: Home / Self Care | Payer: 59 | Source: Ambulatory Visit | Attending: Neurological Surgery | Admitting: Neurological Surgery

## 2012-10-09 DIAGNOSIS — Z01812 Encounter for preprocedural laboratory examination: Secondary | ICD-10-CM | POA: Insufficient documentation

## 2012-10-09 LAB — CBC WITH DIFFERENTIAL/PLATELET
Basophils Absolute: 0.1 10*3/uL (ref 0.0–0.1)
Basophils Relative: 1 % (ref 0–1)
Eosinophils Absolute: 0.2 10*3/uL (ref 0.0–0.7)
Eosinophils Relative: 2 % (ref 0–5)
HCT: 39.9 % (ref 39.0–52.0)
Hemoglobin: 14 g/dL (ref 13.0–17.0)
Lymphocytes Relative: 32 % (ref 12–46)
Lymphs Abs: 2.8 10*3/uL (ref 0.7–4.0)
MCH: 30.9 pg (ref 26.0–34.0)
MCHC: 35.1 g/dL (ref 30.0–36.0)
MCV: 88.1 fL (ref 78.0–100.0)
Monocytes Absolute: 0.7 10*3/uL (ref 0.1–1.0)
Monocytes Relative: 8 % (ref 3–12)
Neutro Abs: 4.9 10*3/uL (ref 1.7–7.7)
Neutrophils Relative %: 57 % (ref 43–77)
Platelets: 157 10*3/uL (ref 150–400)
RBC: 4.53 MIL/uL (ref 4.22–5.81)
RDW: 12.8 % (ref 11.5–15.5)
WBC: 8.6 10*3/uL (ref 4.0–10.5)

## 2012-10-09 LAB — BASIC METABOLIC PANEL
BUN: 14 mg/dL (ref 6–23)
CO2: 25 mEq/L (ref 19–32)
Calcium: 9.2 mg/dL (ref 8.4–10.5)
Chloride: 106 mEq/L (ref 96–112)
Creatinine, Ser: 1.03 mg/dL (ref 0.50–1.35)
GFR calc Af Amer: 87 mL/min — ABNORMAL LOW (ref >90)
GFR calc non Af Amer: 75 mL/min — ABNORMAL LOW (ref >90)
Glucose, Bld: 109 mg/dL — ABNORMAL HIGH (ref 70–99)
Potassium: 4.6 mEq/L (ref 3.5–5.1)
Sodium: 138 mEq/L (ref 135–145)

## 2012-10-09 LAB — TYPE AND SCREEN
ABO/RH(D): O NEG
Antibody Screen: NEGATIVE

## 2012-10-09 LAB — PROTIME-INR
INR: 0.92 (ref 0.00–1.49)
Prothrombin Time: 12.2 seconds (ref 11.6–15.2)

## 2012-10-09 LAB — ABO/RH: ABO/RH(D): O NEG

## 2012-10-09 LAB — SURGICAL PCR SCREEN
MRSA, PCR: NEGATIVE
Staphylococcus aureus: NEGATIVE

## 2012-10-09 NOTE — Pre-Procedure Instructions (Signed)
ESTEL SCHOLZE  10/09/2012   Your procedure is scheduled on:  10/18/2012  Report to [facility] at 5:30 AM.  Call this number if you have problems the morning of surgery: [phone]   Remember:   Do not eat food or drink liquids after midnight.- on [facility]   Take these medicines the morning of surgery with A SIP OF WATER: may take pain medicine as needed day of surgery    Do not wear jewelry  Do not wear lotions, powders, or perfumes. You may wear deodorant.   Men may shave face and neck.  Do not bring valuables to the hospital.  [facility] is not responsible                   for any belongings or valuables.  Contacts, dentures or bridgework may not be worn into surgery.  Leave suitcase in the car. After surgery it may be brought to your room.  For patients admitted to the hospital, checkout time is 11:00 AM the day of  discharge.   Patients discharged the day of surgery will not be allowed to drive  Home.   Name and phone number of your driver: with wife  Special Instructions: Shower using CHG 2 nights before surgery and the night before surgery.  If you shower the day of surgery use CHG.  Use special wash - you have one bottle of CHG for all showers.  You should use approximately 1/3 of the bottle for each shower.   Please read over the following fact sheets that you were given: Pain Booklet, Coughing and Deep Breathing, Blood Transfusion Information, Lab Information, MRSA Information and Surgical Site Infection Prevention

## 2012-10-09 NOTE — Progress Notes (Signed)
Pt. Reports that he recently had H&P by PCP, last week in anticipation of surgery, [facility]

## 2012-10-17 MED ORDER — CEFAZOLIN SODIUM-DEXTROSE 2-3 GM-% IV SOLR
2.0000 g | INTRAVENOUS | Status: AC
  Administered 2012-10-18: 2 g via INTRAVENOUS
  Filled 2012-10-17: qty 50

## 2012-10-17 MED ORDER — DEXAMETHASONE SODIUM PHOSPHATE 10 MG/ML IJ SOLN
10.0000 mg | INTRAMUSCULAR | Status: AC
  Administered 2012-10-18: 10 mg via INTRAVENOUS
  Filled 2012-10-17: qty 1

## 2012-10-18 ENCOUNTER — Encounter (HOSPITAL_COMMUNITY)
Admission: RE | Disposition: A | Discharge status: Home / Self Care | Payer: Self-pay | Source: Ambulatory Visit | Attending: Neurological Surgery

## 2012-10-18 ENCOUNTER — Ambulatory Visit (HOSPITAL_COMMUNITY): Payer: 59

## 2012-10-18 ENCOUNTER — Inpatient Hospital Stay (HOSPITAL_COMMUNITY)
Admission: RE | Admit: 2012-10-18 | Discharge: 2012-10-21 | DRG: 460 | Disposition: A | Discharge status: Home / Self Care | Payer: 59 | Source: Ambulatory Visit | Attending: Neurological Surgery | Admitting: Neurological Surgery

## 2012-10-18 ENCOUNTER — Encounter (HOSPITAL_COMMUNITY): Payer: Self-pay | Admitting: *Deleted

## 2012-10-18 ENCOUNTER — Ambulatory Visit (HOSPITAL_COMMUNITY): Payer: 59 | Admitting: Anesthesiology

## 2012-10-18 ENCOUNTER — Encounter (HOSPITAL_COMMUNITY): Payer: Self-pay | Admitting: Anesthesiology

## 2012-10-18 DIAGNOSIS — Z7982 Long term (current) use of aspirin: Secondary | ICD-10-CM

## 2012-10-18 DIAGNOSIS — Z87891 Personal history of nicotine dependence: Secondary | ICD-10-CM

## 2012-10-18 DIAGNOSIS — M48061 Spinal stenosis, lumbar region without neurogenic claudication: Principal | ICD-10-CM | POA: Diagnosis present

## 2012-10-18 DIAGNOSIS — Z981 Arthrodesis status: Secondary | ICD-10-CM

## 2012-10-18 SURGERY — POSTERIOR LUMBAR FUSION 2 LEVEL
Anesthesia: General | Site: Back | Wound class: Clean

## 2012-10-18 MED ORDER — ALBUMIN HUMAN 5 % IV SOLN
INTRAVENOUS | Status: DC | PRN
  Administered 2012-10-18: 10:00:00 via INTRAVENOUS

## 2012-10-18 MED ORDER — METHOCARBAMOL 100 MG/ML IJ SOLN
500.0000 mg | Freq: Four times a day (QID) | INTRAVENOUS | Status: DC | PRN
  Filled 2012-10-18: qty 5

## 2012-10-18 MED ORDER — HYDROMORPHONE HCL PF 1 MG/ML IJ SOLN
0.2500 mg | INTRAMUSCULAR | Status: DC | PRN
  Administered 2012-10-18 (×4): 0.5 mg via INTRAVENOUS

## 2012-10-18 MED ORDER — HYDROMORPHONE HCL PF 1 MG/ML IJ SOLN
INTRAMUSCULAR | Status: AC
  Filled 2012-10-18: qty 1

## 2012-10-18 MED ORDER — ONDANSETRON HCL 4 MG/2ML IJ SOLN: 4.0000 mg | INTRAMUSCULAR | Status: DC | PRN

## 2012-10-18 MED ORDER — DEXAMETHASONE SODIUM PHOSPHATE 4 MG/ML IJ SOLN
4.0000 mg | Freq: Four times a day (QID) | INTRAMUSCULAR | Status: DC
  Administered 2012-10-19: 4 mg via INTRAVENOUS
  Filled 2012-10-18 (×13): qty 1

## 2012-10-18 MED ORDER — NEOSTIGMINE METHYLSULFATE 1 MG/ML IJ SOLN
INTRAMUSCULAR | Status: DC | PRN
  Administered 2012-10-18: 4 mg via INTRAVENOUS

## 2012-10-18 MED ORDER — ASPIRIN 81 MG PO TABS: 81.0000 mg | ORAL_TABLET | Freq: Every day | ORAL | Status: DC

## 2012-10-18 MED ORDER — ACETAMINOPHEN 650 MG RE SUPP: 650.0000 mg | RECTAL | Status: DC | PRN

## 2012-10-18 MED ORDER — FENTANYL CITRATE 0.05 MG/ML IJ SOLN: 50.0000 ug | Freq: Once | INTRAMUSCULAR | Status: DC

## 2012-10-18 MED ORDER — ACETAMINOPHEN 325 MG PO TABS
650.0000 mg | ORAL_TABLET | ORAL | Status: DC | PRN
  Filled 2012-10-18: qty 2

## 2012-10-18 MED ORDER — METHOCARBAMOL 500 MG PO TABS
ORAL_TABLET | ORAL | Status: AC
  Filled 2012-10-18: qty 1

## 2012-10-18 MED ORDER — OXYCODONE HCL 5 MG/5ML PO SOLN: 5.0000 mg | Freq: Once | ORAL | Status: DC | PRN

## 2012-10-18 MED ORDER — SODIUM CHLORIDE 0.9 % IV SOLN
INTRAVENOUS | Status: DC | PRN
  Administered 2012-10-18: 12:00:00 via INTRAVENOUS

## 2012-10-18 MED ORDER — OXYCODONE HCL 5 MG PO TABS: 5.0000 mg | ORAL_TABLET | Freq: Once | ORAL | Status: DC | PRN

## 2012-10-18 MED ORDER — LACTATED RINGERS IV SOLN
INTRAVENOUS | Status: DC | PRN
  Administered 2012-10-18 (×3): via INTRAVENOUS

## 2012-10-18 MED ORDER — MENTHOL 3 MG MT LOZG: 1.0000 | LOZENGE | OROMUCOSAL | Status: DC | PRN

## 2012-10-18 MED ORDER — HEMOSTATIC AGENTS (NO CHARGE) OPTIME
TOPICAL | Status: DC | PRN
  Administered 2012-10-18: 1 via TOPICAL

## 2012-10-18 MED ORDER — BUPIVACAINE HCL (PF) 0.25 % IJ SOLN
INTRAMUSCULAR | Status: DC | PRN
  Administered 2012-10-18: 6 mL

## 2012-10-18 MED ORDER — MORPHINE SULFATE 2 MG/ML IJ SOLN
1.0000 mg | INTRAMUSCULAR | Status: DC | PRN
  Administered 2012-10-18 – 2012-10-19 (×3): 2 mg via INTRAVENOUS
  Filled 2012-10-18 (×3): qty 1

## 2012-10-18 MED ORDER — SODIUM CHLORIDE 0.9 % IJ SOLN: 3.0000 mL | INTRAMUSCULAR | Status: DC | PRN

## 2012-10-18 MED ORDER — POTASSIUM CHLORIDE IN NACL 20-0.9 MEQ/L-% IV SOLN
INTRAVENOUS | Status: DC
  Administered 2012-10-18 – 2012-10-19 (×2): via INTRAVENOUS
  Filled 2012-10-18 (×7): qty 1000

## 2012-10-18 MED ORDER — MIDAZOLAM HCL 5 MG/5ML IJ SOLN
INTRAMUSCULAR | Status: DC | PRN
  Administered 2012-10-18: 2 mg via INTRAVENOUS

## 2012-10-18 MED ORDER — MIDAZOLAM HCL 2 MG/2ML IJ SOLN: 1.0000 mg | INTRAMUSCULAR | Status: DC | PRN

## 2012-10-18 MED ORDER — GLYCOPYRROLATE 0.2 MG/ML IJ SOLN
INTRAMUSCULAR | Status: DC | PRN
  Administered 2012-10-18: 0.6 mg via INTRAVENOUS

## 2012-10-18 MED ORDER — ZOLPIDEM TARTRATE 5 MG PO TABS: 10.0000 mg | ORAL_TABLET | Freq: Every evening | ORAL | Status: DC | PRN

## 2012-10-18 MED ORDER — VECURONIUM BROMIDE 10 MG IV SOLR
INTRAVENOUS | Status: DC | PRN
  Administered 2012-10-18: 1 mg via INTRAVENOUS
  Administered 2012-10-18: 2 mg via INTRAVENOUS

## 2012-10-18 MED ORDER — DEXAMETHASONE 4 MG PO TABS
4.0000 mg | ORAL_TABLET | Freq: Four times a day (QID) | ORAL | Status: DC
  Administered 2012-10-18 – 2012-10-21 (×10): 4 mg via ORAL
  Filled 2012-10-18 (×15): qty 1

## 2012-10-18 MED ORDER — LIDOCAINE HCL 4 % MT SOLN
OROMUCOSAL | Status: DC | PRN
  Administered 2012-10-18: 4 mL via TOPICAL

## 2012-10-18 MED ORDER — MORPHINE SULFATE 2 MG/ML IJ SOLN
INTRAMUSCULAR | Status: AC
  Administered 2012-10-18: 2 mg via INTRAVENOUS
  Filled 2012-10-18: qty 1

## 2012-10-18 MED ORDER — SENNA 8.6 MG PO TABS
1.0000 | ORAL_TABLET | Freq: Two times a day (BID) | ORAL | Status: DC
  Administered 2012-10-18 – 2012-10-21 (×6): 8.6 mg via ORAL
  Filled 2012-10-18 (×7): qty 1

## 2012-10-18 MED ORDER — ASPIRIN EC 81 MG PO TBEC
81.0000 mg | DELAYED_RELEASE_TABLET | Freq: Every day | ORAL | Status: DC
  Administered 2012-10-18 – 2012-10-21 (×4): 81 mg via ORAL
  Filled 2012-10-18 (×4): qty 1

## 2012-10-18 MED ORDER — ACETAMINOPHEN 10 MG/ML IV SOLN
1000.0000 mg | Freq: Four times a day (QID) | INTRAVENOUS | Status: DC
  Administered 2012-10-18: 1000 mg via INTRAVENOUS
  Filled 2012-10-18 (×3): qty 100

## 2012-10-18 MED ORDER — ARTIFICIAL TEARS OP OINT
TOPICAL_OINTMENT | OPHTHALMIC | Status: DC | PRN
  Administered 2012-10-18: 1 via OPHTHALMIC

## 2012-10-18 MED ORDER — ACETAMINOPHEN 10 MG/ML IV SOLN
INTRAVENOUS | Status: AC
  Filled 2012-10-18: qty 100

## 2012-10-18 MED ORDER — THROMBIN 5000 UNITS EX SOLR
OROMUCOSAL | Status: DC | PRN
  Administered 2012-10-18: 11:00:00 via TOPICAL

## 2012-10-18 MED ORDER — ONDANSETRON HCL 4 MG/2ML IJ SOLN
INTRAMUSCULAR | Status: DC | PRN
  Administered 2012-10-18: 4 mg via INTRAVENOUS

## 2012-10-18 MED ORDER — THROMBIN 20000 UNITS EX SOLR
CUTANEOUS | Status: DC | PRN
  Administered 2012-10-18 (×2): via TOPICAL

## 2012-10-18 MED ORDER — PROPOFOL 10 MG/ML IV BOLUS
INTRAVENOUS | Status: DC | PRN
  Administered 2012-10-18: 200 mg via INTRAVENOUS

## 2012-10-18 MED ORDER — EPHEDRINE SULFATE 50 MG/ML IJ SOLN
INTRAMUSCULAR | Status: DC | PRN
  Administered 2012-10-18: 10 mg via INTRAVENOUS
  Administered 2012-10-18: 5 mg via INTRAVENOUS
  Administered 2012-10-18: 10 mg via INTRAVENOUS

## 2012-10-18 MED ORDER — CEFAZOLIN SODIUM 1-5 GM-% IV SOLN
1.0000 g | Freq: Three times a day (TID) | INTRAVENOUS | Status: AC
  Administered 2012-10-18 – 2012-10-19 (×2): 1 g via INTRAVENOUS
  Filled 2012-10-18 (×2): qty 50

## 2012-10-18 MED ORDER — OXYCODONE HCL 5 MG PO TABS
ORAL_TABLET | ORAL | Status: AC
  Filled 2012-10-18: qty 2

## 2012-10-18 MED ORDER — SODIUM CHLORIDE 0.9 % IR SOLN
Status: DC | PRN
  Administered 2012-10-18: 08:00:00

## 2012-10-18 MED ORDER — THROMBIN 5000 UNITS EX SOLR
CUTANEOUS | Status: DC | PRN
  Administered 2012-10-18 (×2): 5000 [IU] via TOPICAL

## 2012-10-18 MED ORDER — LACTATED RINGERS IV SOLN
INTRAVENOUS | Status: DC | PRN
  Administered 2012-10-18: 08:00:00 via INTRAVENOUS

## 2012-10-18 MED ORDER — LIDOCAINE HCL (CARDIAC) 20 MG/ML IV SOLN
INTRAVENOUS | Status: DC | PRN
  Administered 2012-10-18: 65 mg via INTRAVENOUS

## 2012-10-18 MED ORDER — ACETAMINOPHEN 10 MG/ML IV SOLN
1000.0000 mg | Freq: Four times a day (QID) | INTRAVENOUS | Status: AC
  Administered 2012-10-18 (×2): 1000 mg via INTRAVENOUS
  Filled 2012-10-18 (×2): qty 100

## 2012-10-18 MED ORDER — SODIUM CHLORIDE 0.9 % IV SOLN
10.0000 mg | INTRAVENOUS | Status: DC | PRN
  Administered 2012-10-18: 10 ug/min via INTRAVENOUS

## 2012-10-18 MED ORDER — CELECOXIB 200 MG PO CAPS
200.0000 mg | ORAL_CAPSULE | Freq: Two times a day (BID) | ORAL | Status: DC
  Administered 2012-10-18 – 2012-10-21 (×6): 200 mg via ORAL
  Filled 2012-10-18 (×7): qty 1

## 2012-10-18 MED ORDER — PHENOL 1.4 % MT LIQD: 1.0000 | OROMUCOSAL | Status: DC | PRN

## 2012-10-18 MED ORDER — RAMIPRIL 10 MG PO CAPS
10.0000 mg | ORAL_CAPSULE | Freq: Every day | ORAL | Status: DC
  Administered 2012-10-19 – 2012-10-21 (×3): 10 mg via ORAL
  Filled 2012-10-18 (×4): qty 1

## 2012-10-18 MED ORDER — PROMETHAZINE HCL 25 MG/ML IJ SOLN: 6.2500 mg | INTRAMUSCULAR | Status: DC | PRN

## 2012-10-18 MED ORDER — SODIUM CHLORIDE 0.9 % IV SOLN
INTRAVENOUS | Status: AC
  Filled 2012-10-18: qty 500

## 2012-10-18 MED ORDER — METHOCARBAMOL 500 MG PO TABS
500.0000 mg | ORAL_TABLET | Freq: Four times a day (QID) | ORAL | Status: DC | PRN
  Administered 2012-10-18: 500 mg via ORAL

## 2012-10-18 MED ORDER — FENTANYL CITRATE 0.05 MG/ML IJ SOLN
INTRAMUSCULAR | Status: DC | PRN
  Administered 2012-10-18: 50 ug via INTRAVENOUS
  Administered 2012-10-18: 100 ug via INTRAVENOUS
  Administered 2012-10-18 (×2): 50 ug via INTRAVENOUS
  Administered 2012-10-18: 100 ug via INTRAVENOUS
  Administered 2012-10-18: 50 ug via INTRAVENOUS
  Administered 2012-10-18 (×2): 100 ug via INTRAVENOUS
  Administered 2012-10-18 (×2): 50 ug via INTRAVENOUS

## 2012-10-18 MED ORDER — BACITRACIN 50000 UNITS IM SOLR
INTRAMUSCULAR | Status: AC
  Filled 2012-10-18: qty 1

## 2012-10-18 MED ORDER — OXYCODONE HCL 5 MG PO TABS
10.0000 mg | ORAL_TABLET | ORAL | Status: DC | PRN
  Administered 2012-10-18 – 2012-10-21 (×14): 10 mg via ORAL
  Filled 2012-10-18 (×14): qty 2

## 2012-10-18 MED ORDER — SODIUM CHLORIDE 0.9 % IJ SOLN
3.0000 mL | Freq: Two times a day (BID) | INTRAMUSCULAR | Status: DC
  Administered 2012-10-18 – 2012-10-21 (×5): 3 mL via INTRAVENOUS

## 2012-10-18 MED ORDER — SODIUM CHLORIDE 0.9 % IV SOLN: 250.0000 mL | INTRAVENOUS | Status: DC

## 2012-10-18 MED ORDER — ROCURONIUM BROMIDE 100 MG/10ML IV SOLN
INTRAVENOUS | Status: DC | PRN
  Administered 2012-10-18: 30 mg via INTRAVENOUS
  Administered 2012-10-18: 10 mg via INTRAVENOUS
  Administered 2012-10-18: 50 mg via INTRAVENOUS
  Administered 2012-10-18: 10 mg via INTRAVENOUS

## 2012-10-18 MED FILL — Sodium Chloride IV Soln 0.9%: INTRAVENOUS | Qty: 1000 | Status: AC

## 2012-10-18 MED FILL — Sodium Chloride Irrigation Soln 0.9%: Qty: 3000 | Status: AC

## 2012-10-18 MED FILL — Heparin Sodium (Porcine) Inj 1000 Unit/ML: INTRAMUSCULAR | Qty: 30 | Status: AC

## 2012-10-18 SURGICAL SUPPLY — 65 items
APL SKNCLS STERI-STRIP NONHPOA (GAUZE/BANDAGES/DRESSINGS) ×1
BAG DECANTER FOR FLEXI CONT (MISCELLANEOUS) ×2 IMPLANT
BENZOIN TINCTURE PRP APPL 2/3 (GAUZE/BANDAGES/DRESSINGS) ×2 IMPLANT
BLADE SURG ROTATE 9660 (MISCELLANEOUS) IMPLANT
BUR MATCHSTICK NEURO 3.0 LAGG (BURR) ×2 IMPLANT
CAGE MAS PLIF 14X23X4 (Cage) ×4 IMPLANT
CANISTER SUCTION 2500CC (MISCELLANEOUS) ×2 IMPLANT
CLOTH BEACON ORANGE TIMEOUT ST (SAFETY) ×2 IMPLANT
CONT SPEC 4OZ CLIKSEAL STRL BL (MISCELLANEOUS) ×4 IMPLANT
COVER BACK TABLE 24X17X13 BIG (DRAPES) IMPLANT
COVER TABLE BACK 60X90 (DRAPES) ×2 IMPLANT
CoRoent Large MP 11x9x28 8 deg (Neuro Prosthesis/Implant) ×4 IMPLANT
DRAPE C-ARM 42X72 X-RAY (DRAPES) ×4 IMPLANT
DRAPE LAPAROTOMY 100X72X124 (DRAPES) ×2 IMPLANT
DRAPE POUCH INSTRU U-SHP 10X18 (DRAPES) ×2 IMPLANT
DRAPE SURG 17X23 STRL (DRAPES) ×2 IMPLANT
DRESSING TELFA 8X3 (GAUZE/BANDAGES/DRESSINGS) ×2 IMPLANT
DRSG OPSITE 4X5.5 SM (GAUZE/BANDAGES/DRESSINGS) ×3 IMPLANT
DURAPREP 26ML APPLICATOR (WOUND CARE) ×2 IMPLANT
ELECT REM PT RETURN 9FT ADLT (ELECTROSURGICAL) ×2
ELECTRODE REM PT RTRN 9FT ADLT (ELECTROSURGICAL) ×1 IMPLANT
EVACUATOR 1/8 PVC DRAIN (DRAIN) ×2 IMPLANT
GAUZE SPONGE 4X4 16PLY XRAY LF (GAUZE/BANDAGES/DRESSINGS) IMPLANT
GLOVE BIO SURGEON STRL SZ8 (GLOVE) ×2 IMPLANT
GLOVE BIOGEL M 8.0 STRL (GLOVE) ×2 IMPLANT
GLOVE ECLIPSE 7.5 STRL STRAW (GLOVE) ×1 IMPLANT
GLOVE INDICATOR 7.0 STRL GRN (GLOVE) ×2 IMPLANT
GLOVE INDICATOR 8.0 STRL GRN (GLOVE) ×1 IMPLANT
GLOVE OPTIFIT SS 6.5 STRL BRWN (GLOVE) ×6 IMPLANT
GOWN BRE IMP SLV AUR LG STRL (GOWN DISPOSABLE) IMPLANT
GOWN BRE IMP SLV AUR XL STRL (GOWN DISPOSABLE) ×5 IMPLANT
GOWN STRL REIN 2XL LVL4 (GOWN DISPOSABLE) IMPLANT
HEMOSTAT POWDER KIT SURGIFOAM (HEMOSTASIS) ×2 IMPLANT
HEMOSTAT POWDER SURGIFOAM 1G (HEMOSTASIS) ×2 IMPLANT
KIT BASIN OR (CUSTOM PROCEDURE TRAY) ×2 IMPLANT
KIT ROOM TURNOVER OR (KITS) ×2 IMPLANT
MILL MEDIUM DISP (BLADE) ×1 IMPLANT
NDL HYPO 25X1 1.5 SAFETY (NEEDLE) ×1 IMPLANT
NEEDLE BONE MARROW 8GAX6 (NEEDLE) ×1 IMPLANT
NEEDLE HYPO 25X1 1.5 SAFETY (NEEDLE) ×2 IMPLANT
NS IRRIG 1000ML POUR BTL (IV SOLUTION) ×2 IMPLANT
PACK FOAM VITOSS 10CC (Orthopedic Implant) ×1 IMPLANT
PACK LAMINECTOMY NEURO (CUSTOM PROCEDURE TRAY) ×2 IMPLANT
PAD ARMBOARD 7.5X6 YLW CONV (MISCELLANEOUS) ×6 IMPLANT
ROD PREBENT 60MM (Rod) ×2 IMPLANT
SCREW 6.5X40MM (Screw) ×8 IMPLANT
SCREW BN 40X6.5XPA NS SPNE (Screw) IMPLANT
SCREW LOCK (Screw) ×12 IMPLANT
SCREW LOCK 100X5.5X OPN (Screw) ×6 IMPLANT
SCREW POLY 45X6.5 (Screw) ×2 IMPLANT
SCREW POLY 6.5X45MM (Screw) ×4 IMPLANT
SPONGE LAP 4X18 X RAY DECT (DISPOSABLE) IMPLANT
SPONGE SURGIFOAM ABS GEL 100 (HEMOSTASIS) ×2 IMPLANT
STRIP CLOSURE SKIN 1/2X4 (GAUZE/BANDAGES/DRESSINGS) ×3 IMPLANT
SUT VIC AB 0 CT1 18XCR BRD8 (SUTURE) ×1 IMPLANT
SUT VIC AB 0 CT1 8-18 (SUTURE) ×4
SUT VIC AB 2-0 CP2 18 (SUTURE) ×3 IMPLANT
SUT VIC AB 3-0 SH 8-18 (SUTURE) ×4 IMPLANT
SYR 20ML ECCENTRIC (SYRINGE) ×2 IMPLANT
SYR CONTROL 10ML LL (SYRINGE) ×1 IMPLANT
TOWEL OR 17X24 6PK STRL BLUE (TOWEL DISPOSABLE) ×2 IMPLANT
TOWEL OR 17X26 10 PK STRL BLUE (TOWEL DISPOSABLE) ×2 IMPLANT
TRAP SPECIMEN MUCOUS 40CC (MISCELLANEOUS) IMPLANT
TRAY FOLEY CATH 14FRSI W/METER (CATHETERS) ×2 IMPLANT
WATER STERILE IRR 1000ML POUR (IV SOLUTION) ×2 IMPLANT

## 2012-10-18 NOTE — Preoperative (Signed)
Beta Blockers   Reason not to administer Beta Blockers:Not Applicable 

## 2012-10-18 NOTE — Op Note (Signed)
10/18/2012  1:07 PM  PATIENT:  Garrett Green  67 y.o. 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 Decompressive lumbar laminectomy L3-4, L4-5, and L5-S1  requiring more work than would be required of the typical PLIF procedure in order to adequately decompress the neural elements.  2. Posterior lumbar interbody fusion L4-5 and L5-S1 using PEEK interbody cages packed with morcellized allograft and autograft   3. Posterior fixation L4-5 L5-S1 using nuvasive pedicle screws.  4. Intertransverse arthrodesis L4-5 L5-S1 using morcellized autograft and allograft. 5. harvesting of bone marrow aspirate from the right iliac crest through a separate fascial incision  SURGEON:  Marikay Alar, MD  ASSISTANTS: Dr. Newell Coral  ANESTHESIA:  General  EBL: 500 ml  Total I/O In: 3350 [I.V.:2850; Blood:250; IV Piggyback:250] Out: 1290 [Urine:790; Blood:500]  BLOOD ADMINISTERED:250 CC PRBC  DRAINS: Hemovac   INDICATION FOR PROCEDURE: This patient had undergone a previous L5-S1 hemilaminectomy for L5 radiculopathy. He had return of pain. 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 He also had lumbar spinal stenosis L3-4 L4-5. He tried medical management without relief. I recommended a decompression instrumented fusion to address a spinal stenosis as well as a segmental instability. Patient understood the risks, benefits, and alternatives and potential outcomes and wished to proceed.  PROCEDURE DETAILS:  The patient was brought to the operating room. After induction of generalized endotracheal anesthesia the patient was rolled into the prone position on chest rolls and all pressure points were padded. The patient's lumbar region was cleaned and then  prepped with DuraPrep and draped in the usual sterile fashion. Anesthesia was injected and then a dorsal midline incision was made and carried down to the lumbosacral fascia. The fascia was opened and the paraspinous musculature was taken down in a subperiosteal fashion to expose L3-4 L4-5 and L5-S1. Intraoperative fluoroscopy confirmed my level, and the spinous process was removed and complete lumbar laminectomies, hemi- facetectomies, and foraminotomies were performed at L4-5 and L5-S1.Marland Kitchen Also remove the inferior part of the lamina of L4 to decompress the L3-4 level also. The yellow ligament was removed to expose the underlying dura and nerve roots at all 3 levels, and generous foraminotomies were performed to adequately decompress the neural elements. He had severely overgrown facets, and he had severe foraminal stenosis at L5-S1 and L4-5 on the left. Once the decompression was complete, I turned my attention to the posterior lower lumbar interbody fusion. The epidural venous vasculature was coagulated and cut sharply. Disc space was incised and the initial discectomy was performed with pituitary rongeurs. The disc space was distracted with sequential distractors to a height of 12 mm at each level. We then used a series of scrapers and shavers to prepare the endplates for fusion. The midline was prepared with Epstein curettes at each level. Once the complete discectomy was finished at each level, we packed an appropriate sized peek interbody cage with local autograft and morcellized allograft, gently retracted the nerve root, and tapped the cage into position at L4-5 and L5-S1 bilaterally. . The midline was packed with morselized autograft and allograft. We then turned our attention to the posterior fixation. The pedicle screw entry zones were identified utilizing surface landmarks and fluoroscopy. We probed each pedicle with the pedicle probe and tapped each pedicle with the appropriate tap.  We palpated with a  ball probe to assure no break in the cortex. We then placed 6 5 x 45 mm pedicle screws into the pedicles bilaterally at L4, 6 5 x 40 mm pedicle screws at L5, and 6 5 x 40 mm pedicle screws at S1. We then decorticated the transverse processes and laid a mixture of morcellized autograft and allograft out over these to perform intertransverse arthrodesis at L4-S1. We then placed lordotic rods into the multiaxial screw heads of the pedicle screws and locked these in position with the locking caps and anti-torque device. We achieved compression of our grafts. We then checked our construct with AP and lateral fluoroscopy. Irrigated with copious amounts of bacitracin-containing saline solution. Placed a medium Hemovac drain through separate stab incision. Inspected the nerve roots once again to assure adequate decompression, lined to the dura with Gelfoam, and closed the muscle and the fascia with 0 Vicryl. Closed the subcutaneous tissues with 2-0 Vicryl and subcuticular tissues with 3-0 Vicryl. The skin was closed with benzoin and Steri-Strips. Dressing was then applied, the patient was awakened from general anesthesia and transported to the recovery room in stable condition. At the end of the procedure all sponge, needle and instrument counts were correct.   PLAN OF CARE: Admit to inpatient   PATIENT DISPOSITION:  PACU - hemodynamically stable.   Delay start of Pharmacological VTE agent (>24hrs) due to surgical blood loss or risk of bleeding:  yes

## 2012-10-18 NOTE — H&P (Signed)
Subjective: Patient is a 67 y.o. male admitted for DLL and PLIF. He is s/p L L5-S1 hemilaminectomy. Onset of symptoms was a few months ago, gradually worse since that time.  The pain is rated severe, and is located at the L buttock and radiates to LLE. The pain is described as aching and occurs constantly. The symptoms have been progressive. Symptoms are exacerbated by activity. MRI or CT showed stenosis L3-4 L4-5 with dynamic spondylolisthesis L5-S1.   Past Medical History  Diagnosis Date  . Hypertension     stress test- 8-9 yrs. ago, [facility], [facility]  . Arthritis     pt. reports its related to back only at this point     Past Surgical History  Procedure Laterality Date  . Shoulder surgery      LEFT 12+ YEARS   . Esophageal dilation    . Lumbar laminectomy/decompression microdiscectomy Left 07/26/2012    Procedure: LUMBAR LAMINECTOMY/DECOMPRESSION MICRODISCECTOMY 1 LEVEL;  Surgeon: Tia Alert, MD;  Location: [facility];  Service: Neurosurgery;  Laterality: Left;  lumbar five sacral one  . Tonsillectomy      Prior to Admission medications   Medication Sig Start Date End Date Taking? Authorizing Provider  aspirin 81 MG tablet Take 81 mg by mouth daily.   Yes Historical Provider, MD  fluticasone (FLONASE) 50 MCG/ACT nasal spray Place 2 sprays into the nose daily as needed for allergies.    Yes Historical Provider, MD  HYDROcodone-acetaminophen (NORCO/VICODIN) 5-325 MG per tablet Take 2 tablets by mouth every 6 (six) hours as needed for pain.   Yes Historical Provider, MD  loratadine (CLARITIN) 10 MG tablet Take 10 mg by mouth daily as needed for allergies.   Yes Historical Provider, MD  Omega-3 Fatty Acids (FISH OIL) 1200 MG CAPS Take 1,200 mg by mouth every other day.   Yes Historical Provider, MD  oxyCODONE-acetaminophen (PERCOCET) 10-325 MG per tablet Take 1 tablet by mouth every 6 (six) hours as needed for pain.   Yes Historical Provider, MD  ramipril (ALTACE) 10 MG capsule Take 10 mg by  mouth daily before breakfast.    Yes Historical Provider, MD  zolpidem (AMBIEN) 10 MG tablet Take 10 mg by mouth at bedtime as needed for sleep.   Yes Historical Provider, MD   No Known Allergies  History  Substance Use Topics  . Smoking status: Former Smoker -- 1.00 packs/day for 15 years    Quit date: 10/10/1982  . Smokeless tobacco: Not on file     Comment: stopped smoking around age 47  . Alcohol Use: Yes     Comment: OCC     History reviewed. No pertinent family history.   Review of Systems  Positive ROS: neg  All other systems have been reviewed and were otherwise negative with the exception of those mentioned in the HPI and as above.  Objective: Vital signs in last 24 hours: Temp:  [97.6 F (36.4 C)] 97.6 F (36.4 C) (07/10 0606) Pulse Rate:  [66] 66 (07/10 0606) Resp:  [20] 20 (07/10 0606) BP: (142)/(83) 142/83 mmHg (07/10 0606) SpO2:  [98 %] 98 % (07/10 0606)  General Appearance: Alert, cooperative, no distress, appears stated age Head: Normocephalic, without obvious abnormality, atraumatic Eyes: PERRL, conjunctiva/corneas clear, EOM's intact   Neck: Supple, symmetrical, trachea midline Back: Symmetric, no curvature, ROM normal, no CVA tenderness Lungs: Clear to auscultation bilaterally, respirations unlabored Heart: Regular rate and rhythm Abdomen: Soft, non-tender Extremities: Extremities normal, atraumatic, no cyanosis or edema Pulses:  2+ and symmetric all extremities Skin: Skin color, texture, turgor normal, no rashes or lesions  NEUROLOGIC:   Mental status: Alert and oriented x4,  no aphasia, good attention span, fund of knowledge, and memory Motor Exam - grossly normal Sensory Exam - grossly normal Reflexes: neg Coordination - grossly normal Gait - grossly normal Balance - grossly normal Cranial Nerves: I: smell Not tested  II: visual acuity  OS: nl    OD: nl  II: visual fields Full to confrontation  II: pupils Equal, round, reactive to light   III,VII: ptosis None  III,IV,VI: extraocular muscles  Full ROM  V: mastication Normal  V: facial light touch sensation  Normal  V,VII: corneal reflex  Present  VII: facial muscle function - upper  Normal  VII: facial muscle function - lower Normal  VIII: hearing Not tested  IX: soft palate elevation  Normal  IX,X: gag reflex Present  XI: trapezius strength  5/5  XI: sternocleidomastoid strength 5/5  XI: neck flexion strength  5/5  XII: tongue strength  Normal    Data Review Lab Results  Component Value Date   WBC 8.6 10/09/2012   HGB 14.0 10/09/2012   HCT 39.9 10/09/2012   MCV 88.1 10/09/2012   PLT 157 10/09/2012   Lab Results  Component Value Date   NA 138 10/09/2012   K 4.6 10/09/2012   CL 106 10/09/2012   CO2 25 10/09/2012   BUN 14 10/09/2012   CREATININE 1.03 10/09/2012   GLUCOSE 109* 10/09/2012   Lab Results  Component Value Date   INR 0.92 10/09/2012    Assessment/Plan: Patient admitted for PLIF L4-5 L5-S1. Patient has failed conservative therapy.  I explained the condition and procedure to the patient and answered any questions.  Patient wishes to proceed with procedure as planned. Understands risks/ benefits and typical outcomes of procedure.   Lyndzee Kliebert,Yifan S 10/18/2012 6:36 AM

## 2012-10-18 NOTE — OR Nursing (Signed)
Report to Byrd Hesselbach, RN (pacu handoff)

## 2012-10-18 NOTE — Anesthesia Preprocedure Evaluation (Signed)
Anesthesia Evaluation  Patient identified by MRN, date of birth, ID band Patient awake    Reviewed: Allergy & Precautions, H&P , NPO status , Patient's Chart, lab work & pertinent test results  History of Anesthesia Complications Negative for: history of anesthetic complications  Airway Mallampati: II TM Distance: >3 FB Neck ROM: Full    Dental  (+) Teeth Intact and Dental Advisory Given   Pulmonary neg pulmonary ROS, former smoker,  breath sounds clear to auscultation  Pulmonary exam normal       Cardiovascular hypertension, Pt. on medications Rhythm:Regular Rate:Normal     Neuro/Psych negative psych ROS   GI/Hepatic negative GI ROS, Neg liver ROS,   Endo/Other  negative endocrine ROS  Renal/GU negative Renal ROS     Musculoskeletal   Abdominal   Peds  Hematology   Anesthesia Other Findings   Reproductive/Obstetrics                           Anesthesia Physical Anesthesia Plan  ASA: II  Anesthesia Plan: General   Post-op Pain Management:    Induction: Intravenous  Airway Management Planned: Oral ETT  Additional Equipment:   Intra-op Plan:   Post-operative Plan: Extubation in OR  Informed Consent: I have reviewed the patients History and Physical, chart, labs and discussed the procedure including the risks, benefits and alternatives for the proposed anesthesia with the patient or authorized representative who has indicated his/her understanding and acceptance.     Plan Discussed with: CRNA and Surgeon  Anesthesia Plan Comments:         Anesthesia Quick Evaluation

## 2012-10-18 NOTE — Transfer of Care (Signed)
Immediate Anesthesia Transfer of Care Note  Patient: Garrett Green  Procedure(s) Performed: Procedure(s) with comments: Lumbar four-five,Lumbar five sacral one POSTERIOR LUMBAR FUSION 2 LEVEL, Partial Laminectomy Lumbar three-four,pedicle screws Lumbar four to sacral one (N/A) - Lumbar Four-Five Lumbar Five-Sacral One Posterior Lumbar Fusion, lumbar three-four partial laminectomy  Patient Location: PACU  Anesthesia Type:General  Level of Consciousness: awake, alert , oriented and patient cooperative  Airway & Oxygen Therapy: Patient Spontanous Breathing and Patient connected to nasal cannula oxygen  Post-op Assessment: Report given to PACU RN, Post -op Vital signs reviewed and stable and Patient moving all extremities X 4  Post vital signs: Reviewed and stable  Complications: No apparent anesthesia complications

## 2012-10-18 NOTE — Anesthesia Postprocedure Evaluation (Signed)
  Anesthesia Post-op Note  Patient: Garrett Green  Procedure(s) Performed: Procedure(s) with comments: Lumbar four-five,Lumbar five sacral one POSTERIOR LUMBAR FUSION 2 LEVEL, Partial Laminectomy Lumbar three-four,pedicle screws Lumbar four to sacral one (N/A) - Lumbar Four-Five Lumbar Five-Sacral One Posterior Lumbar Fusion, lumbar three-four partial laminectomy  Patient Location: PACU  Anesthesia Type:General  Level of Consciousness: awake  Airway and Oxygen Therapy: Patient Spontanous Breathing  Post-op Pain: mild  Post-op Assessment: Post-op Vital signs reviewed, Patient's Cardiovascular Status Stable, Respiratory Function Stable, Patent Airway, No signs of Nausea or vomiting and Pain level controlled  Post-op Vital Signs: stable  Complications: No apparent anesthesia complications

## 2012-10-19 NOTE — Evaluation (Signed)
Occupational Therapy Evaluation Patient Details Name: Garrett Green MRN: [id number] DOB: [birth year] Today's Date: 10/19/2012 Time: 4098-1191 OT Time Calculation (min): 25 min  OT Assessment / Plan / Recommendation History of present illness 67 y/o male s/p PLIF L3-4, L4-5, L5-S1.   Clinical Impression   Patient is s/p PLIF L3-S1 surgery resulting in functional limitations due to the deficits listed below (see OT problem list). Pt educated on back precautions and demonstrated don brace. Patient will benefit from skilled OT acutely to increase independence and safety with ADLS to allow discharge HHOT.     OT Assessment  Patient needs continued OT Services    Follow Up Recommendations  Home health OT    Barriers to Discharge      Equipment Recommendations  Other (comment) (TBA)    Recommendations for Other Services    Frequency  Min 2X/week    Precautions / Restrictions Precautions Precautions: Back;Fall Required Braces or Orthoses: Spinal Brace Spinal Brace: Applied in sitting position;Lumbar corset Restrictions Weight Bearing Restrictions: No   Pertinent Vitals/Pain Minimal pain. Pt states "doesn't really hurt"    ADL  Eating/Feeding: Supervision/safety Where Assessed - Eating/Feeding: Chair Grooming: Teeth care;Supervision/safety Where Assessed - Grooming: Unsupported standing Upper Body Dressing: Supervision/safety Where Assessed - Upper Body Dressing: Supported sitting Lower Body Dressing: Supervision/safety Where Assessed - Lower Body Dressing: Supine, head of bed flat Toilet Transfer: Minimal assistance Toilet Transfer Method: Sit to stand Toilet Transfer Equipment: Regular height toilet Toileting - Clothing Manipulation and Hygiene: Minimal assistance Where Assessed - Toileting Clothing Manipulation and Hygiene: Sit to stand from 3-in-1 or toilet Equipment Used: Back brace;Gait belt Transfers/Ambulation Related to ADLs: Pt ambulated to bathroom guarded and  first time moving. Pt with dizziness iniitally sitting and then again with static standing. pt with extended time resolved and able to progress ADL Comments: Pt educated on back precautions and provided handout. Pt able to recall 3 out 3 precautions during session. Pt complete oral care a sink level. Pt left with PT ambulating    OT Diagnosis: Generalized weakness;Acute pain  OT Problem List: Decreased strength;Decreased activity tolerance;Impaired balance (sitting and/or standing);Decreased safety awareness;Decreased knowledge of use of DME or AE;Pain OT Treatment Interventions: Self-care/ADL training;DME and/or AE instruction;Therapeutic activities;Patient/family education;Balance training   OT Goals(Current goals can be found in the care plan section) Acute Rehab OT Goals Patient Stated Goal: to walk and be independent OT Goal Formulation: With patient Time For Goal Achievement: 11/02/12 Potential to Achieve Goals: Good ADL Goals Pt Will Perform Upper Body Bathing: with modified independence;standing Pt Will Perform Lower Body Bathing: with modified independence;sit to/from stand Pt Will Perform Toileting - Clothing Manipulation and hygiene: with modified independence;sit to/from stand Pt Will Perform Tub/Shower Transfer: with modified independence;with caregiver independent in assisting Additional ADL Goal #1: Pt will don/ doff back brace mod I  Visit Information  Last OT Received On: 10/19/12 Assistance Needed: +1 PT/OT Co-Evaluation/Treatment: Yes History of Present Illness: 67 y/o male s/p PLIF L3-4, L4-5, L5-S1.       Prior Functioning     Home Living Family/patient expects to be discharged to:: Private residence Living Arrangements: Spouse/significant other Available Help at Discharge: Family;Available 24 hours/day Type of Home: House Home Access: Stairs to enter [employer] of Steps: 4 Entrance Stairs-Rails: Left Home Layout: One level Home Equipment:  Walker - 2 wheels;Hand held shower head Prior Function Level of Independence: Independent Communication Communication: No difficulties Dominant Hand: Right         Vision/Perception Vision -  History Baseline Vision: Wears glasses only for reading Patient Visual Report: No change from baseline   Cognition  Cognition Arousal/Alertness: Awake/alert Behavior During Therapy: WFL for tasks assessed/performed Overall Cognitive Status: Within Functional Limits for tasks assessed    Extremity/Trunk Assessment Upper Extremity Assessment Upper Extremity Assessment: Overall WFL for tasks assessed Lower Extremity Assessment Lower Extremity Assessment: Overall WFL for tasks assessed Cervical / Trunk Assessment Cervical / Trunk Assessment: Normal     Mobility Bed Mobility Bed Mobility: Rolling Right;Right Sidelying to Sit Rolling Right: 4: Min assist;With rail Right Sidelying to Sit: 4: Min assist;HOB flat Details for Bed Mobility Assistance: min tactile assist for sequencing log roll and follow through Transfers Sit to Stand: 4: Min assist;With upper extremity assist;From bed Stand to Sit: 4: Min guard;With upper extremity assist;To chair/3-in-1;With armrests Details for Transfer Assistance: min tactile cues for safe technique and posture, cues for controlled descent to chair     Exercise     Balance Balance Balance Assessed: Yes Static Standing Balance Static Standing - Balance Support: During functional activity Static Standing - Level of Assistance: 5: Stand by assistance   End of Session OT - End of Session Activity Tolerance: Patient tolerated treatment well Patient left: Other (comment) (with PT ambulating) Nurse Communication: Mobility status;Precautions  GO     Lucile Shutters 10/19/2012, 12:48 PM Pager: [phone]

## 2012-10-19 NOTE — Evaluation (Signed)
Physical Therapy Evaluation Patient Details Name: Garrett Green MRN: [id number] DOB: March 08, 1946 Today's Date: 10/19/2012 Time: 5284-1324 PT Time Calculation (min): 28 min  PT Assessment / Plan / Recommendation History of Present Illness  67 y/o male s/p PLIF L3-4, L4-5, L5-S1.  Clinical Impression  Presents to PT with below impairments (see PT problem list) impacting patients functional independence. Will benefit physical therapy in the acute setting to maximize safe and independent mobility while adhering to back precautions for safe d/c home.     PT Assessment  Patient needs continued PT services    Follow Up Recommendations  No PT follow up    Does the patient have the potential to tolerate intense rehabilitation      Barriers to Discharge        Equipment Recommendations  None recommended by PT    Recommendations for Other Services     Frequency Min 5X/week    Precautions / Restrictions Precautions Precautions: Back;Fall Restrictions Weight Bearing Restrictions: No   Pertinent Vitals/Pain Reports moderate pain 5/10 that worsened with activity, RN in the room when I left offering pain meds      Mobility  Bed Mobility Bed Mobility: Rolling Right;Right Sidelying to Sit Rolling Right: 4: Min assist;With rail Right Sidelying to Sit: 4: Min assist;HOB flat Details for Bed Mobility Assistance: min tactile assist for sequencing log roll and follow through Transfers Transfers: Sit to Stand;Stand to Sit Sit to Stand: 4: Min assist;With upper extremity assist;From bed Stand to Sit: 4: Min guard;With upper extremity assist;To chair/3-in-1;With armrests Details for Transfer Assistance: min tactile cues for safe technique and posture, cues for controlled descent to chair Ambulation/Gait Ambulation/Gait Assistance: 4: Min guard Ambulation Distance (Feet): 200 Feet Assistive device: None Ambulation/Gait Assistance Details: cues for posture and safe speed Gait Pattern:  Decreased stride length;Decreased trunk rotation;Narrow base of support Gait velocity: decreased Stairs: No    Exercises     PT Diagnosis: Difficulty walking;Acute pain  PT Problem List: Decreased activity tolerance;Decreased balance;Decreased mobility;Decreased knowledge of precautions;Pain PT Treatment Interventions: Gait training;Stair training;Functional mobility training;Therapeutic activities;Therapeutic exercise;Balance training;Neuromuscular re-education;Patient/family education     PT Goals(Current goals can be found in the care plan section) Acute Rehab PT Goals Patient Stated Goal: to walk and be independent PT Goal Formulation: With patient Time For Goal Achievement: 10/26/12 Potential to Achieve Goals: Good  Visit Information  Last PT Received On: 10/19/12 Assistance Needed: +1 History of Present Illness: 67 y/o male s/p PLIF L3-4, L4-5, L5-S1.       Prior Functioning  Home Living Family/patient expects to be discharged to:: Private residence Living Arrangements: Spouse/significant other Available Help at Discharge: Family;Available 24 hours/day Type of Home: House Home Access: Stairs to enter [employer] of Steps: 4 Entrance Stairs-Rails: Left Home Layout: One level Home Equipment: Walker - 2 wheels;Hand held shower head Prior Function Level of Independence: Independent Communication Communication: No difficulties Dominant Hand: Right    Cognition  Cognition Arousal/Alertness: Awake/alert Behavior During Therapy: WFL for tasks assessed/performed Overall Cognitive Status: Within Functional Limits for tasks assessed    Extremity/Trunk Assessment Upper Extremity Assessment Upper Extremity Assessment: Overall WFL for tasks assessed Lower Extremity Assessment Lower Extremity Assessment: Overall WFL for tasks assessed Cervical / Trunk Assessment Cervical / Trunk Assessment: Normal   Balance Balance Balance Assessed: Yes Static Standing  Balance Static Standing - Balance Support: During functional activity Static Standing - Level of Assistance: 5: Stand by assistance  End of Session PT - End of Session Equipment Utilized  During Treatment: Gait belt;Back brace Activity Tolerance: Patient tolerated treatment well Patient left: in chair;with call bell/phone within reach;with family/visitor present Nurse Communication: Mobility status  GP     [facility] HELEN 10/19/2012, 11:28 AM

## 2012-10-19 NOTE — Progress Notes (Signed)
Patient ID: Garrett Green, male   DOB: 1946/02/25, 67 y.o.   MRN: [id number] Doing reasonably well. Back appropriately sore, No leg pain or N/T/W. Good strength. Pleased with progress. PT/OT.

## 2012-10-19 NOTE — Progress Notes (Signed)
UR COMPLETED  

## 2012-10-20 NOTE — Progress Notes (Signed)
Physical Therapy Discharge Patient Details Name: Garrett Green MRN: [id number] DOB: 06-24-1945 Today's Date: 10/20/2012 Time: 2841-3244 PT Time Calculation (min): 15 min  Patient discharged from PT services secondary to goals met and no further PT needs identified.  Please see latest therapy progress note for current level of functioning and progress toward goals.    Progress and discharge plan discussed with patient and/or caregiver: Patient/Caregiver agrees with plan  GP     Sallyanne Kuster 10/20/2012, 11:50 AM   Sallyanne Kuster, PTA Office- [phone]

## 2012-10-20 NOTE — Progress Notes (Signed)
Durenda Hurt Renaldo Fiddler, [facility] Acute Rehab Services Pager [phone]

## 2012-10-20 NOTE — Progress Notes (Signed)
Occupational Therapy Treatment Patient Details Name: Garrett Green MRN: [id number] DOB: 04/10/1946 Today's Date: 10/20/2012 Time: 1308-6578 OT Time Calculation (min): 9 min  OT Assessment / Plan / Recommendation  OT comments  Pt is s/p PLIF surgery resulting in the deficits listed below (see OT problem list). All education is complete and patient indicates understanding. OT to sign off acutely.   Follow Up Recommendations  No OT follow up    Barriers to Discharge       Equipment Recommendations  None recommended by OT    Recommendations for Other Services    Frequency     Progress towards OT Goals Progress towards OT goals: Goals met/education completed, patient discharged from OT  Plan Discharge plan needs to be updated    Precautions / Restrictions Precautions Precautions: Back;Fall Required Braces or Orthoses: Spinal Brace Spinal Brace: Applied in sitting position;Lumbar corset Restrictions Weight Bearing Restrictions: No   Pertinent Vitals/Pain None-  pt states " i am taking it easy. Doctor told me not to over do it"    ADL  Tub/Shower Transfer: Independent Tub/Shower Transfer Method: Ambulating Equipment Used: Back brace;Gait belt Transfers/Ambulation Related to ADLs: Pt ambulating Mod I with no DME. pt is adequate level for d/c home ADL Comments: Pt completed don doff brace independently. pt completed bed mobility Independently. pt completed tub transfer. No further OT needs    OT Diagnosis:    OT Problem List:   OT Treatment Interventions:     OT Goals(current goals can now be found in the care plan section) Acute Rehab OT Goals Patient Stated Goal: to walk and be independent ADL Goals Pt Will Perform Upper Body Bathing: with modified independence;standing Pt Will Perform Lower Body Bathing: with modified independence;sit to/from stand Pt Will Perform Toileting - Clothing Manipulation and hygiene: with modified independence;sit to/from stand Pt Will  Perform Tub/Shower Transfer: with modified independence;with caregiver independent in assisting Additional ADL Goal #1: Pt will don/ doff back brace mod I  Visit Information  Last OT Received On: 10/20/12 Assistance Needed: +1 History of Present Illness: 67 y/o male s/p PLIF L3-4, L4-5, L5-S1.    Subjective Data      Prior Functioning       Cognition  Cognition Arousal/Alertness: Awake/alert Behavior During Therapy: WFL for tasks assessed/performed Overall Cognitive Status: Within Functional Limits for tasks assessed    Mobility  Bed Mobility Bed Mobility: Supine to Sit;Sitting - Scoot to [place] of Bed;Sit to Supine Rolling Right: 7: Independent Right Sidelying to Sit: 7: Independent;HOB flat Supine to Sit: 7: Independent;HOB flat Sitting - Scoot to Edge of Bed: 7: Independent Sit to Supine: 7: Independent;HOB flat Transfers Sit to Stand: 7: Independent;From bed;With upper extremity assist Stand to Sit: 7: Independent;With upper extremity assist;To bed    Exercises      Balance     End of Session OT - End of Session Activity Tolerance: Patient tolerated treatment well Patient left: in bed;with call bell/phone within reach  GO     Lucile Shutters 10/20/2012, 10:40 AM Pager: [phone]

## 2012-10-20 NOTE — Progress Notes (Signed)
I have reviewed the above note and agree with plan. Durenda Hurt Renaldo Fiddler, [facility] Acute Rehab Services Pager [phone]

## 2012-10-20 NOTE — Progress Notes (Signed)
Patient ID: Garrett Green, male   DOB: 11/25/1945, 67 y.o.   MRN: [id number] doiong quite well. No leg pain or N/T/W. Worries about D/C today, wants to stay one more day. Back sore when OOB. Good Df/PF. Home tomorrow.

## 2012-10-20 NOTE — Progress Notes (Signed)
Physical Therapy Treatment Patient Details Name: Garrett Green MRN: [id number] DOB: 1945/10/31 Today's Date: 10/20/2012 Time: 1478-2956 PT Time Calculation (min): 15 min  PT Assessment / Plan / Recommendation  PT Comments   Pt now at independent level with all mobility and with precautions associated with back surgery. Acute PT to sign off with goals met and no follow up needs identified by PT.   Follow Up Recommendations  No PT follow up           Equipment Recommendations  None recommended by PT       Frequency Min 5X/week   Progress towards PT Goals Progress towards PT goals: Goals met/education completed, patient discharged from PT  Plan Current plan remains appropriate    Precautions / Restrictions Precautions Precautions: Fall;Back Precaution Comments: Pt independent in recall and demo of 3/3 back precautions. Pt independent in don/doff of brace at edge of bed. Required Braces or Orthoses: Spinal Brace Spinal Brace: Applied in sitting position;Lumbar corset Restrictions Weight Bearing Restrictions: No       Mobility  Bed Mobility Bed Mobility: Supine to Sit;Sitting - Scoot to [place] of Bed;Sit to Supine Rolling Right: 7: Independent Right Sidelying to Sit: 7: Independent;HOB flat Supine to Sit: 7: Independent;HOB flat Sitting - Scoot to Edge of Bed: 7: Independent Sit to Supine: 7: Independent;HOB flat Details for Bed Mobility Assistance: no cues, assist or rails needed with bed mobiltiy Transfers Sit to Stand: 7: Independent;From bed Stand to Sit: 7: Independent;With armrests;With upper extremity assist;To chair/3-in-1 Details for Transfer Assistance: no cues or assist needed with transfers Ambulation/Gait Ambulation/Gait Assistance: 6: Modified independent (Device/Increase time) Ambulation Distance (Feet): 200 Feet Assistive device: None Ambulation/Gait Assistance Details: no cues or assistance needed with gait Gait Pattern: Within Functional Limits Gait  velocity: WFL Stairs: Yes Stairs Assistance: 6: Modified independent (Device/Increase time) Stair Management Technique: One rail Left;Forwards Number of Stairs: 10       PT Goals (current goals can now be found in the care plan section) Acute Rehab PT Goals Patient Stated Goal: to walk and be independent PT Goal Formulation: With patient Time For Goal Achievement: 10/26/12 Potential to Achieve Goals: Good  Visit Information  Last PT Received On: 10/20/12 Assistance Needed: +1 History of Present Illness: 67 y/o male s/p PLIF L3-4, L4-5, L5-S1.    Subjective Data  Patient Stated Goal: to walk and be independent   Cognition  Cognition Arousal/Alertness: Awake/alert Behavior During Therapy: WFL for tasks assessed/performed Overall Cognitive Status: Within Functional Limits for tasks assessed       End of Session PT - End of Session Equipment Utilized During Treatment: Gait belt;Back brace Activity Tolerance: Patient tolerated treatment well Patient left: in chair;with call bell/phone within reach Nurse Communication: Mobility status   GP     Sallyanne Kuster 10/20/2012, 11:49 AM  Sallyanne Kuster, PTA Office- [phone]

## 2012-10-20 NOTE — Progress Notes (Signed)
Occupational Therapy Discharge Patient Details Name: Garrett Green MRN: [id number] DOB: May 29, 1945 Today's Date: 10/20/2012 Time: 4098-1191 OT Time Calculation (min): 9 min  Patient discharged from OT services secondary to goals met and no further OT needs identified.  Please see latest therapy progress note for current level of functioning and progress toward goals.    Progress and discharge plan discussed with patient and/or caregiver: Patient/Caregiver agrees with plan  GO     Lucile Shutters Pager: [phone]  10/20/2012, 10:42 AM

## 2012-10-21 MED ORDER — OXYCODONE HCL 10 MG PO TABS: 10.0000 mg | ORAL_TABLET | ORAL | Status: DC | PRN

## 2012-10-21 NOTE — Discharge Summary (Signed)
  Physician Discharge Summary  Patient ID: SHARONE ALMOND MRN: [id number] DOB/AGE: 1945-11-12 67 y.o.  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  Discharge Diagnoses: same Active Problems:   * No active hospital problems. *   Discharged Condition: good  Hospital Course: patient did the hospital underwent a posterior lumbar interbody fusion L4-5 L5-S1 decompression L3-4 postoperatively patient did very well with recovered in the floor on the floor was convalescing well ambulating and voiding spontaneously tolerating regular diet and stable to discharge home on postop day 3.  Consults: Significant Diagnostic Studies: Treatments:L3-4, L4-5, L5-S1 decompressive laminectomy and fusion L4-S1 Discharge Exam: Blood pressure 124/68, pulse 75, temperature 97.9 F (36.6 C), temperature source Oral, resp. rate 20, height 5\' 8"  (1.727 m), weight 87.181 kg (192 lb 3.2 oz), SpO2 98.00%. Strength 5 out of 5 wound clean and dry  Disposition: home     Medication List         aspirin 81 MG tablet  Take 81 mg by mouth daily.     Fish Oil 1200 MG Caps  Take 1,200 mg by mouth every other day.     fluticasone 50 MCG/ACT nasal spray  Commonly known as:  FLONASE  Place 2 sprays into the nose daily as needed for allergies.     HYDROcodone-acetaminophen 5-325 MG per tablet  Commonly known as:  NORCO/VICODIN  Take 2 tablets by mouth every 6 (six) hours as needed for pain.     loratadine 10 MG tablet  Commonly known as:  CLARITIN  Take 10 mg by mouth daily as needed for allergies.     Oxycodone HCl 10 MG Tabs  Take 1 tablet (10 mg total) by mouth every 4 (four) hours as needed.     oxyCODONE-acetaminophen 10-325 MG per tablet  Commonly known as:  PERCOCET  Take 1 tablet by mouth every 6 (six) hours as needed for pain.     ramipril 10 MG capsule  Commonly known as:  ALTACE  Take 10 mg by mouth daily before  breakfast.     zolpidem 10 MG tablet  Commonly known as:  AMBIEN  Take 10 mg by mouth at bedtime as needed for sleep.           Follow-up Information   Follow up with Tia Alert, MD.   Contact information:   [address] [place] [address] [phone]       Signed: Gloris Shiroma P 10/21/2012, 10:09 AM

## 2012-10-21 NOTE — Progress Notes (Signed)
Patient ID: Garrett Green, male   DOB: 1945/06/20, 67 y.o.   MRN: [id number] Patient is doing Well ready for discharge

## 2014-04-11 HISTORY — PX: RETINAL DETACHMENT SURGERY: SHX105

## 2014-10-08 ENCOUNTER — Telehealth: Payer: Self-pay | Admitting: Family Medicine

## 2014-10-08 MED ORDER — RAMIPRIL 10 MG PO CAPS: 10.0000 mg | ORAL_CAPSULE | Freq: Every day | ORAL | Status: DC

## 2014-10-08 NOTE — Telephone Encounter (Signed)
Please refill med below. Thanks!

## 2014-10-08 NOTE — Telephone Encounter (Signed)
Pt contacted office for refill request on the following medications:  ramipril (ALTACE) 10 MG capsule.  [facility].  Pt is requesting a 30 day supply.  Pt has his CPE 10/27/2014.  CB#[phone]/[id number]

## 2014-10-22 DIAGNOSIS — R1310 Dysphagia, unspecified: Secondary | ICD-10-CM | POA: Insufficient documentation

## 2014-10-22 DIAGNOSIS — M479 Spondylosis, unspecified: Secondary | ICD-10-CM | POA: Insufficient documentation

## 2014-10-22 DIAGNOSIS — G4700 Insomnia, unspecified: Secondary | ICD-10-CM | POA: Insufficient documentation

## 2014-10-27 ENCOUNTER — Ambulatory Visit (INDEPENDENT_AMBULATORY_CARE_PROVIDER_SITE_OTHER): Payer: Medicare Other | Admitting: Family Medicine

## 2014-10-27 ENCOUNTER — Encounter: Payer: Self-pay | Admitting: Family Medicine

## 2014-10-27 VITALS — BP 140/84 | HR 62 | Temp 97.7°F | Resp 16 | Ht 67.5 in | Wt 199.0 lb

## 2014-10-27 DIAGNOSIS — Z125 Encounter for screening for malignant neoplasm of prostate: Secondary | ICD-10-CM | POA: Diagnosis not present

## 2014-10-27 DIAGNOSIS — R739 Hyperglycemia, unspecified: Secondary | ICD-10-CM

## 2014-10-27 DIAGNOSIS — Z0000 Encounter for general adult medical examination without abnormal findings: Secondary | ICD-10-CM

## 2014-10-27 DIAGNOSIS — J309 Allergic rhinitis, unspecified: Secondary | ICD-10-CM | POA: Diagnosis not present

## 2014-10-27 DIAGNOSIS — K219 Gastro-esophageal reflux disease without esophagitis: Secondary | ICD-10-CM | POA: Diagnosis not present

## 2014-10-27 DIAGNOSIS — M47816 Spondylosis without myelopathy or radiculopathy, lumbar region: Secondary | ICD-10-CM | POA: Diagnosis not present

## 2014-10-27 DIAGNOSIS — Z23 Encounter for immunization: Secondary | ICD-10-CM | POA: Diagnosis not present

## 2014-10-27 DIAGNOSIS — I10 Essential (primary) hypertension: Secondary | ICD-10-CM | POA: Diagnosis not present

## 2014-10-27 MED ORDER — RAMIPRIL 10 MG PO CAPS: 10.0000 mg | ORAL_CAPSULE | Freq: Every day | ORAL | Status: DC

## 2014-10-27 MED ORDER — HYDROCODONE-ACETAMINOPHEN 5-325 MG PO TABS: 1.0000 | ORAL_TABLET | Freq: Four times a day (QID) | ORAL | Status: DC | PRN

## 2014-10-27 MED ORDER — FLUTICASONE PROPIONATE 50 MCG/ACT NA SUSP: 2.0000 | Freq: Every day | NASAL | Status: DC | PRN

## 2014-10-27 NOTE — Progress Notes (Signed)
Patient: Garrett Green, Male    DOB: 1946-02-10, 69 y.o.   MRN: [id number] Visit Date: 10/27/2014  Today's Provider: Lelon Huh, MD   Chief Complaint  Patient presents with  . Medicare Wellness  . Hypertension  . Insomnia  . Hip Pain   Subjective:    Annual wellness visit Garrett Green is a 69 y.o. male who presents today for his Subsequent Annual Wellness Visit. He feels well. He reports exercising yes. He reports he is sleeping well.  ----------------------------------------------------------- Follow-up Insomnia; changed to Trazodone 100 mg qd form [facility] due to cost. Follow-up Hip pain; no changes were made.   Hypertension, follow-up:  BP Readings from Last 3 Encounters:  10/27/14 140/84  10/23/13 130/68  10/21/12 124/68    He was last seen for hypertension 1 years ago.  BP at that visit was 130/68. Management changes since that visit include none. He reports good compliance with treatment. He is not having side effects. none  He is exercising. He is adherent to low salt diet.   Outside blood pressures are 140/80. He is experiencing none.  Patient denies none.   Cardiovascular risk factors include none.  Use of agents associated with hypertension: none.     Weight trend: stable Wt Readings from Last 3 Encounters:  10/27/14 199 lb (90.266 kg)  10/23/13 200 lb (90.719 kg)  10/18/12 192 lb 3.2 oz (87.181 kg)    Current diet: in general, a "healthy" diet    ------------------------------------------------------------------------      Review of Systems  Constitutional: Negative for fever, chills, appetite change and fatigue.  HENT: Negative for congestion, ear pain, hearing loss, nosebleeds and trouble swallowing.   Eyes: Negative for pain and visual disturbance.  Respiratory: Negative for cough, chest tightness and shortness of breath.   Cardiovascular: Negative for chest pain, palpitations and leg swelling.  Gastrointestinal: Negative  for nausea, vomiting, abdominal pain, diarrhea, constipation and blood in stool.  Endocrine: Negative for polydipsia, polyphagia and polyuria.  Genitourinary: Negative for dysuria and flank pain.  Musculoskeletal: Negative for myalgias, back pain, joint swelling, arthralgias and neck stiffness.  Skin: Negative for color change, rash and wound.  Neurological: Negative for dizziness, tremors, seizures, speech difficulty, weakness, light-headedness and headaches.  Psychiatric/Behavioral: Negative for behavioral problems, confusion, sleep disturbance, dysphoric mood and decreased concentration. The patient is not nervous/anxious.   All other systems reviewed and are negative.   History   Social History  . Marital Status: Married    Spouse Name: N/A  . Number of Children: N/A  . Years of Education: N/A   Occupational History  . Not on file.   Social History Main Topics  . Smoking status: Former Smoker -- 1.00 packs/day for 15 years    Quit date: 10/10/1982  . Smokeless tobacco: Not on file     Comment: stopped smoking around age 25  . Alcohol Use: Yes     Comment: OCC   . Drug Use: No  . Sexual Activity: Not on file   Other Topics Concern  . Not on file   Social History Narrative    Patient Active Problem List   Diagnosis Date Noted  . Degenerative arthritis of spine 10/22/2014  . Dysphagia 10/22/2014  . Insomnia 10/22/2014  . Fam hx-ischem heart disease 08/13/2007  . 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 Arthralgia of hip or thigh 09/26/2006  . Allergic rhinitis 09/17/2001  . GERD (gastroesophageal reflux disease) 11/14/1994  . Essential (primary) hypertension 11/14/1994  Past Surgical History  Procedure Laterality Date  . Shoulder surgery      LEFT 12+ YEARS   . Esophageal dilation    . Lumbar laminectomy/decompression microdiscectomy Left 07/26/2012    Procedure: LUMBAR LAMINECTOMY/DECOMPRESSION MICRODISCECTOMY 1 LEVEL;  Surgeon: Eustace Moore, MD;  Location: [facility];   Service: Neurosurgery;  Laterality: Left;  lumbar five sacral one  . Tonsillectomy      His family history includes Cancer in his sister; Dementia in his brother and sister; Diabetes in his brother and sister; Heart disease in his mother.    Previous Medications   ASPIRIN 81 MG TABLET    Take 81 mg by mouth daily.   FLUTICASONE (FLONASE) 50 MCG/ACT NASAL SPRAY    Place 2 sprays into the nose daily as needed for allergies.    HYDROCODONE-ACETAMINOPHEN (NORCO/VICODIN) 5-325 MG PER TABLET    Take 2 tablets by mouth every 6 (six) hours as needed for pain.   LORATADINE (CLARITIN) 10 MG TABLET    Take 10 mg by mouth daily as needed for allergies.   LORAZEPAM (ATIVAN) 1 MG TABLET    Take 1 tablet by mouth as needed.   OMEGA-3 FATTY ACIDS (FISH OIL) 1200 MG CAPS    Take 1,200 mg by mouth every other day.   OXYCODONE 10 MG TABS    Take 1 tablet (10 mg total) by mouth every 4 (four) hours as needed.   OXYCODONE-ACETAMINOPHEN (PERCOCET) 10-325 MG PER TABLET    Take 1 tablet by mouth every 6 (six) hours as needed for pain.   RAMIPRIL (ALTACE) 10 MG CAPSULE    Take 1 capsule (10 mg total) by mouth daily.   TRAZODONE (DESYREL) 100 MG TABLET    Take 100 mg by mouth at bedtime.   ZOLPIDEM (AMBIEN) 10 MG TABLET    Take 10 mg by mouth at bedtime as needed for sleep.    Patient Care Team: Birdie Sons, MD as PCP - General (Family Medicine)     Objective:   Vitals: BP 140/84 mmHg  Pulse 62  Temp(Src) 97.7 F (36.5 C) (Oral)  Resp 16  Ht 5' 7.5" (1.715 m)  Wt 199 lb (90.266 kg)  BMI 30.69 kg/m2  SpO2 97%  Physical Exam   General Appearance:    Alert, cooperative, no distress, appears stated age  Head:    Normocephalic, without obvious abnormality, atraumatic  Eyes:    PERRL, conjunctiva/corneas clear, EOM's intact, fundi    benign, both eyes       Ears:    Normal TM's and external ear canals, both ears  Nose:   Nares normal, septum midline, mucosa normal, no drainage   or sinus tenderness    Throat:   Lips, mucosa, and tongue normal; teeth and gums normal  Neck:   Supple, symmetrical, trachea midline, no adenopathy;       thyroid:  No enlargement/tenderness/nodules; no carotid   bruit or JVD  Back:     Symmetric, no curvature, ROM normal, no CVA tenderness  Lungs:     Clear to auscultation bilaterally, respirations unlabored  Chest wall:    No tenderness or deformity  Heart:    Regular rate and rhythm, S1 and S2 normal, no murmur, rub   or gallop  Abdomen:     Soft, non-tender, bowel sounds active all four quadrants,    no masses, no organomegaly  Genitalia:    deferred  Rectal:    deferred  Extremities:  Extremities normal, atraumatic, no cyanosis or edema  Pulses:   2+ and symmetric all extremities  Skin:   Skin color, texture, turgor normal, no rashes or lesions  Lymph nodes:   Cervical, supraclavicular, and axillary nodes normal  Neurologic:   CNII-XII intact. Normal strength, sensation and reflexes      throughout    Activities of Daily Living In your present state of health, do you have any difficulty performing the following activities: 10/27/2014  Hearing? N  Vision? N  Difficulty concentrating or making decisions? N  Walking or climbing stairs? N  Dressing or bathing? N  Doing errands, shopping? N    Fall Risk Assessment Fall Risk  10/27/2014  Falls in the past year? No     Depression Screen PHQ 2/9 Scores 10/27/2014  PHQ - 2 Score 0      Audit-C Alcohol Use Screening  Question Answer Points  How often do you have alcoholic drink? 2-4 times monthly 2  How many drinks do you typically consume in a day? 1 or 2 0  How oftey will you drink 6 or more in a total? never 0  Total Score:  2   A score of 3 or more in women, and 4 or more in men indicates increased risk for alcohol abuse, EXCEPT if all of the points are from question 1.      Assessment & Plan:     Annual Wellness Visit  Reviewed patient's Family Medical History Reviewed and  updated list of patient's medical providers Assessment of cognitive impairment was done Assessed patient's functional ability Established a written schedule for health screening [place] Completed and Reviewed  Exercise Activities and Dietary recommendations Goals    None      Immunization History  Administered Date(s) Administered  . Pneumococcal Conjugate-13 10/23/2013  . Tdap 06/20/2005  . Zoster 09/02/2009    Health Maintenance  Topic Date Due  . PNA vac Low Risk Adult (2 of 2 - PPSV23) 10/24/2014  . INFLUENZA VACCINE  11/10/2014  . TETANUS/TDAP  06/21/2015  . COLONOSCOPY  08/23/2015  . ZOSTAVAX  Completed      Discussed health benefits of physical activity, and encouraged him to engage in regular exercise appropriate for his age and condition.    ------------------------------------------------------------------------------------------------------------  1. Annual physical exam Generally doing well  2. Hyperglycemia  - Hemoglobin A1c  3. Essential (primary) hypertension well controlled Continue current medications.   - Renal function panel - EKG 12-Lead - ramipril (ALTACE) 10 MG capsule; Take 1 capsule (10 mg total) by mouth daily.  Dispense: 90 capsule; Refill: 4  4. Gastroesophageal reflux disease, esophagitis presence not specified Off PPI and well controlled well with dietary measures.   5. Prostate cancer screening  - PSA  6. Spondylosis of lumbar region without myelopathy or radiculopathy  - HYDROcodone-acetaminophen (NORCO/VICODIN) 5-325 MG per tablet; Take 1 tablet by mouth every 6 (six) hours as needed.  Dispense: 120 tablet; Refill: 0  7. Allergic rhinitis, unspecified allergic rhinitis type

## 2014-10-28 LAB — HEMOGLOBIN A1C
Est. average glucose Bld gHb Est-mCnc: 128 mg/dL
Hgb A1c MFr Bld: 6.1 % — ABNORMAL HIGH (ref 4.8–5.6)

## 2014-10-28 LAB — PSA: Prostate Specific Ag, Serum: 0.7 ng/mL (ref 0.0–4.0)

## 2014-10-28 LAB — RENAL FUNCTION PANEL
Albumin: 4.3 g/dL (ref 3.6–4.8)
BUN/Creatinine Ratio: 11 (ref 10–22)
BUN: 12 mg/dL (ref 8–27)
CO2: 22 mmol/L (ref 18–29)
Calcium: 9.3 mg/dL (ref 8.6–10.2)
Chloride: 105 mmol/L (ref 97–108)
Creatinine, Ser: 1.08 mg/dL (ref 0.76–1.27)
GFR calc Af Amer: 82 mL/min/{1.73_m2} (ref >59)
GFR calc non Af Amer: 71 mL/min/{1.73_m2} (ref >59)
Glucose: 113 mg/dL — ABNORMAL HIGH (ref 65–99)
Phosphorus: 2.6 mg/dL (ref 2.5–4.5)
Potassium: 5.8 mmol/L — ABNORMAL HIGH (ref 3.5–5.2)
Sodium: 141 mmol/L (ref 134–144)

## 2014-11-22 ENCOUNTER — Other Ambulatory Visit: Payer: Self-pay | Admitting: Family Medicine

## 2015-04-12 HISTORY — PX: CATARACT EXTRACTION: SUR2

## 2015-04-20 ENCOUNTER — Other Ambulatory Visit: Payer: Self-pay | Admitting: Family Medicine

## 2015-04-21 ENCOUNTER — Other Ambulatory Visit: Payer: Self-pay | Admitting: Family Medicine

## 2015-09-09 ENCOUNTER — Other Ambulatory Visit: Payer: Self-pay | Admitting: Family Medicine

## 2015-09-09 NOTE — Telephone Encounter (Signed)
Pt contacted office for refill request on the following medications:  HYDROcodone-acetaminophen (NORCO/VICODIN) 5-325 MG tablet.  CB#[phone]/MW

## 2015-09-10 MED ORDER — HYDROCODONE-ACETAMINOPHEN 5-325 MG PO TABS: 1.0000 | ORAL_TABLET | Freq: Four times a day (QID) | ORAL | Status: DC | PRN

## 2015-10-28 ENCOUNTER — Encounter: Payer: Self-pay | Admitting: Family Medicine

## 2015-10-28 ENCOUNTER — Ambulatory Visit (INDEPENDENT_AMBULATORY_CARE_PROVIDER_SITE_OTHER): Payer: Medicare Other | Admitting: Family Medicine

## 2015-10-28 VITALS — BP 130/70 | HR 66 | Temp 97.9°F | Resp 16 | Ht 72.0 in | Wt 200.0 lb

## 2015-10-28 DIAGNOSIS — Z0000 Encounter for general adult medical examination without abnormal findings: Secondary | ICD-10-CM

## 2015-10-28 NOTE — Progress Notes (Signed)
    PATIENT NOT SEEN DUE TO EVACUATION OF [facility] codone-acetaminophen (NORCO/VICODIN) 5-325 MG per tablet;      Hypertension, follow-up:  BP Readings from Last 3 Encounters:  10/28/15 130/70  10/27/14 140/84  10/23/13 130/68    He was last seen for hypertension 1 years ago.  BP at that visit was 140/84. Management since that visit includes; labs checked and showed K+ level is to high. advised to avoid K+ rich foods.Marland KitchenHe reports good compliance with treatment. He is not having side effects. none  He is exercising. He is adherent to low salt diet.   Outside blood pressures are 140/84. He is experiencing none.  Patient denies none.   Cardiovascular risk factors include none.  Use of agents associated with hypertension: none.   ----------------------------------------------------------------------       Review of Systems  HENT: Positive for sinus pressure.   Musculoskeletal: Positive for back pain.  All other systems reviewed and are negative.   Social History   Social History  . Marital Status: Married    Spouse Name: N/A  . Number of Children: N/A  . Years of Education: N/A   Occupational History  . Not on file.   Social History Main Topics  . Smoking status: Former Smoker -- 1.00 packs/day for 15 years    Quit date: 10/10/1982  . Smokeless tobacco: Not on file     Comment: stopped smoking around age 41  . Alcohol Use: Yes     Comment: OCC   . Drug Use: No  . Sexual Activity: Not on file   Other Topics Concern  . Not on file   Social History Narrative      Physical Exam

## 2015-11-19 ENCOUNTER — Encounter: Payer: Self-pay | Admitting: Family Medicine

## 2015-11-26 ENCOUNTER — Encounter: Payer: Self-pay | Admitting: Family Medicine

## 2015-11-26 ENCOUNTER — Ambulatory Visit (INDEPENDENT_AMBULATORY_CARE_PROVIDER_SITE_OTHER): Payer: Medicare Other | Admitting: Family Medicine

## 2015-11-26 VITALS — BP 118/64 | HR 64 | Temp 97.8°F | Resp 16 | Ht 72.0 in | Wt 201.0 lb

## 2015-11-26 DIAGNOSIS — Z23 Encounter for immunization: Secondary | ICD-10-CM | POA: Diagnosis not present

## 2015-11-26 DIAGNOSIS — Z1211 Encounter for screening for malignant neoplasm of colon: Secondary | ICD-10-CM | POA: Diagnosis not present

## 2015-11-26 DIAGNOSIS — M47816 Spondylosis without myelopathy or radiculopathy, lumbar region: Secondary | ICD-10-CM | POA: Diagnosis not present

## 2015-11-26 DIAGNOSIS — R739 Hyperglycemia, unspecified: Secondary | ICD-10-CM

## 2015-11-26 DIAGNOSIS — F40243 Fear of flying: Secondary | ICD-10-CM

## 2015-11-26 DIAGNOSIS — Z0000 Encounter for general adult medical examination without abnormal findings: Secondary | ICD-10-CM | POA: Diagnosis not present

## 2015-11-26 DIAGNOSIS — I10 Essential (primary) hypertension: Secondary | ICD-10-CM | POA: Diagnosis not present

## 2015-11-26 DIAGNOSIS — Z125 Encounter for screening for malignant neoplasm of prostate: Secondary | ICD-10-CM | POA: Diagnosis not present

## 2015-11-26 MED ORDER — HYDROCODONE-ACETAMINOPHEN 5-325 MG PO TABS: 1.0000 | ORAL_TABLET | Freq: Four times a day (QID) | ORAL | 0 refills | Status: DC | PRN

## 2015-11-26 MED ORDER — LORAZEPAM 1 MG PO TABS: 1.0000 mg | ORAL_TABLET | ORAL | 0 refills | Status: DC | PRN

## 2015-11-26 NOTE — Progress Notes (Signed)
Patient: Garrett Green, Male    DOB: July 12, 1945, 70 y.o.   MRN: [id number] Visit Date: 11/26/2015  Today's Provider: Lelon Huh, MD   Chief Complaint  Patient presents with  . Annual Exam  . Hypertension   Subjective:    Annual physical Garrett Green is a 70 y.o. male. He feels well. He reports exercising yes. He reports he is sleeping well.  -----------------------------------------------------------  Gastroesophageal reflux disease, esophagitis presence not specified: From 10/27/14-Off PPI and well controlled well with dietary measures. A1c 10/27/14 was 6.1  Hyperglycemia: From 10/27/14-advised to avoid sweats starchy foods.  Spondylosis of lumbar region without myelopathy or radiculopathy: From 10/27/14- refilled HYDROcodone-acetaminophen (NORCO/VICODIN) 5-325 MG per tablet; He is taking 1-2 x every day. And states pain is adequately controlled. Needs refille dtoday.      Hypertension, follow-up:     BP Readings from Last 3 Encounters:  10/28/15 130/70  10/27/14 140/84  10/23/13 130/68    He was last seen for hypertension 1 years ago.  BP at that visit was 140/84. Management since that visit includes; labs checked and showed K+ level is to high. advised to avoid K+ rich foods.Marland KitchenHe reports good compliance with treatment. He is not having side effects. none  He is exercising. He is adherent to low salt diet.   Outside blood pressures are 140/84. He is experiencing none.  Patient denies none.   Cardiovascular risk factors include none.  Use of agents associated with hypertension: none.       Review of Systems  Constitutional: Negative for appetite change, chills, fatigue and fever.  HENT: Negative for congestion, ear pain, hearing loss, nosebleeds and trouble swallowing.   Eyes: Negative for pain and visual disturbance.  Respiratory: Negative for cough, chest tightness and shortness of breath.   Cardiovascular: Negative for chest pain,  palpitations and leg swelling.  Gastrointestinal: Negative for abdominal pain, blood in stool, constipation, diarrhea, nausea and vomiting.  Endocrine: Negative for polydipsia, polyphagia and polyuria.  Genitourinary: Negative for dysuria and flank pain.  Musculoskeletal: Positive for myalgias. Negative for arthralgias, back pain, joint swelling and neck stiffness.  Skin: Negative for color change, rash and wound.  Neurological: Negative for dizziness, tremors, seizures, speech difficulty, weakness, light-headedness and headaches.  Psychiatric/Behavioral: Negative for behavioral problems, confusion, decreased concentration, dysphoric mood and sleep disturbance. The patient is not nervous/anxious.   All other systems reviewed and are negative.   Social History   Social History  . Marital status: Married    Spouse name: N/A  . Number of children: N/A  . Years of education: N/A   Occupational History  . Not on file.   Social History Main Topics  . Smoking status: Former Smoker    Packs/day: 1.00    Years: 15.00    Quit date: 10/10/1982  . Smokeless tobacco: Not on file     Comment: stopped smoking around age 66  . Alcohol use Yes     Comment: OCC   . Drug use: No  . Sexual activity: Not on file   Other Topics Concern  . Not on file   Social History Narrative  . No narrative on file    Past Medical History:  Diagnosis Date  . Arthritis    pt. reports its related to back only at this point   . History of chicken pox   . Hypertension    stress test- 8-9 yrs. ago, [facility], [facility]     Patient Active  Problem List   Diagnosis Date Noted  . Degenerative arthritis of spine 10/22/2014  . Dysphagia 10/22/2014  . Insomnia 10/22/2014  . Fam hx-ischem heart disease 08/13/2007  . 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 Arthralgia of hip or thigh 09/26/2006  . Allergic rhinitis 09/17/2001  . GERD (gastroesophageal reflux disease) 11/14/1994  . Essential (primary) hypertension 11/14/1994     Past Surgical History:  Procedure Laterality Date  . ESOPHAGEAL DILATION    . LUMBAR LAMINECTOMY/DECOMPRESSION MICRODISCECTOMY Left 07/26/2012   Procedure: LUMBAR LAMINECTOMY/DECOMPRESSION MICRODISCECTOMY 1 LEVEL;  Surgeon: Eustace Moore, MD;  Location: [place] [facility];  Service: Neurosurgery;  Laterality: Left;  lumbar five sacral one  . SHOULDER SURGERY     LEFT 12+ YEARS   . TONSILLECTOMY      His family history includes Cancer in his sister; Dementia in his brother and sister; Diabetes in his brother and sister; Heart disease in his mother.    Current Meds  Medication Sig  . aspirin 81 MG tablet Take 81 mg by mouth daily.  . fluticasone (FLONASE) 50 MCG/ACT nasal spray Place 2 sprays into both nostrils daily as needed for allergies.  Marland Kitchen HYDROcodone-acetaminophen (NORCO/VICODIN) 5-325 MG tablet Take 1 tablet by mouth every 6 (six) hours as needed.  . loratadine (CLARITIN) 10 MG tablet Take 10 mg by mouth daily as needed for allergies.  Marland Kitchen LORazepam (ATIVAN) 1 MG tablet Take 1 tablet by mouth as needed.  . Omega-3 Fatty Acids (FISH OIL) 1200 MG CAPS Take 1,200 mg by mouth every other day.  . ramipril (ALTACE) 10 MG capsule Take 1 capsule by mouth  daily    Patient Care Team: Birdie Sons, MD as PCP - General (Family Medicine) Hulen Luster, MD as Consulting Physician (Gastroenterology)    Objective:   Vitals: BP 118/64 (BP Location: Right Arm, Patient Position: Sitting, Cuff Size: Large)   Pulse 64   Temp 97.8 F (36.6 C) (Oral)   Resp 16   Ht 6' (1.829 m)   Wt 201 lb (91.2 kg)   SpO2 97%   BMI 27.26 kg/m   Physical Exam   General Appearance:    Alert, cooperative, no distress, appears stated age  Head:    Normocephalic, without obvious abnormality, atraumatic  Eyes:    PERRL, conjunctiva/corneas clear, EOM's intact, fundi    benign, both eyes       Ears:    Normal TM's and external ear canals, both ears  Nose:   Nares normal, septum midline, mucosa normal, no  drainage   or sinus tenderness  Throat:   Lips, mucosa, and tongue normal; teeth and gums normal  Neck:   Supple, symmetrical, trachea midline, no adenopathy;       thyroid:  No enlargement/tenderness/nodules; no carotid   bruit or JVD  Back:     Symmetric, no curvature, ROM normal, no CVA tenderness  Lungs:     Clear to auscultation bilaterally, respirations unlabored  Chest wall:    No tenderness or deformity  Heart:    Regular rate and rhythm, S1 and S2 normal, no murmur, rub   or gallop  Abdomen:     Soft, non-tender, bowel sounds active all four quadrants,    no masses, no organomegaly  Genitalia:    deferred  Rectal:    deferred  Extremities:   Extremities normal, atraumatic, no cyanosis or edema  Pulses:   2+ and symmetric all extremities  Skin:   Skin color, texture, turgor normal,  no rashes or lesions  Lymph nodes:   Cervical, supraclavicular, and axillary nodes normal  Neurologic:   CNII-XII intact. Normal strength, sensation and reflexes      throughout    Activities of Daily Living In your present state of health, do you have any difficulty performing the following activities: 11/26/2015 10/28/2015  Hearing? N N  Vision? N N  Difficulty concentrating or making decisions? N N  Walking or climbing stairs? N N  Dressing or bathing? N N  Doing errands, shopping? N N  Some recent data might be hidden    Fall Risk Assessment Fall Risk  11/26/2015 10/28/2015 10/27/2014  Falls in the past year? No No No     Depression Screen PHQ 2/9 Scores 11/26/2015 10/28/2015 10/27/2014  PHQ - 2 Score 0 0 0  PHQ- 9 Score 0 0 -    Cognitive Testing - 6-CIT   PATIENT DECLINED TEST  Correct? Score     Audit-C Alcohol Use Screening  Question Answer Points  How often do you have alcoholic drink? 2-4 times monthly times monthly 2  On days you do drink alcohol, how many drinks do you typically consume? 0 0  How oftey will you drink 6 or more in a total? never 0  Total Score:  2   A  score of 3 or more in women, and 4 or more in men indicates increased risk for alcohol abuse, EXCEPT if all of the points are from question 1.     Assessment & Plan:    Annual Physical Reviewed patient's Family Medical History Reviewed and updated list of patient's medical providers Assessment of cognitive impairment was done Assessed patient's functional ability Established a written schedule for health screening [place] Completed and Reviewed  Exercise Activities and Dietary recommendations Goals    None      Immunization History  Administered Date(s) Administered  . Pneumococcal Conjugate-13 10/23/2013  . Pneumococcal Polysaccharide-23 10/27/2014  . Tdap 06/20/2005  . Zoster 09/02/2009    Health Maintenance  Topic Date Due  . Samul Dada  06/21/2015  . COLONOSCOPY  08/23/2015  . INFLUENZA VACCINE  11/10/2015  . ZOSTAVAX  Completed  . Hepatitis C Screening  Completed  . PNA vac Low Risk Adult  Completed      Discussed health benefits of physical activity, and encouraged him to engage in regular exercise appropriate for his age and condition.    --------------------------------------------------------------------------  1. Annual physical exam Generally doing well.  - Td : Tetanus/diphtheria >7yo Preservative  free - Lipid panel - Comprehensive metabolic panel  2. Essential (primary) hypertension Well controlled.  Continue current medications.   - EKG 12-Lead - Lipid panel - Comprehensive metabolic panel  3. Hyperglycemia Due to check a1c - Hemoglobin A1c  4. Spondylosis of lumbar region without myelopathy or radiculopathy Pain adequately controlled.  - HYDROcodone-acetaminophen (NORCO/VICODIN) 5-325 MG tablet; Take 1 tablet by mouth every 6 (six) hours as needed.  Dispense: 120 tablet; Refill: 0  5. Colon cancer screening Due for screening colonoscopy - Ambulatory referral to Gastroenterology  6. Prostate cancer  screening  - PSA  7. Fear of flying Needs rf lorazepam - LORazepam (ATIVAN) 1 MG tablet; Take 1 tablet (1 mg total) by mouth as needed.  Dispense: 15 tablet; Refill: 0   Lelon Huh, MD  [facility]

## 2015-11-27 ENCOUNTER — Telehealth: Payer: Self-pay

## 2015-11-27 LAB — COMPREHENSIVE METABOLIC PANEL
ALT: 15 IU/L (ref 0–44)
AST: 14 IU/L (ref 0–40)
Albumin/Globulin Ratio: 1.8 (ref 1.2–2.2)
Albumin: 4.3 g/dL (ref 3.6–4.8)
Alkaline Phosphatase: 67 IU/L (ref 39–117)
BUN/Creatinine Ratio: 14 (ref 10–24)
BUN: 18 mg/dL (ref 8–27)
Bilirubin Total: 0.6 mg/dL (ref 0.0–1.2)
CO2: 19 mmol/L (ref 18–29)
Calcium: 9.3 mg/dL (ref 8.6–10.2)
Chloride: 103 mmol/L (ref 96–106)
Creatinine, Ser: 1.33 mg/dL — ABNORMAL HIGH (ref 0.76–1.27)
GFR calc Af Amer: 63 mL/min/{1.73_m2} (ref >59)
GFR calc non Af Amer: 55 mL/min/{1.73_m2} — ABNORMAL LOW (ref >59)
Globulin, Total: 2.4 g/dL (ref 1.5–4.5)
Glucose: 117 mg/dL — ABNORMAL HIGH (ref 65–99)
Potassium: 5.7 mmol/L — ABNORMAL HIGH (ref 3.5–5.2)
Sodium: 139 mmol/L (ref 134–144)
Total Protein: 6.7 g/dL (ref 6.0–8.5)

## 2015-11-27 LAB — LIPID PANEL
Chol/HDL Ratio: 4 ratio units (ref 0.0–5.0)
Cholesterol, Total: 194 mg/dL (ref 100–199)
HDL: 49 mg/dL (ref >39)
LDL Calculated: 117 mg/dL — ABNORMAL HIGH (ref 0–99)
Triglycerides: 141 mg/dL (ref 0–149)
VLDL Cholesterol Cal: 28 mg/dL (ref 5–40)

## 2015-11-27 LAB — PSA: Prostate Specific Ag, Serum: 0.6 ng/mL (ref 0.0–4.0)

## 2015-11-27 LAB — HEMOGLOBIN A1C
Est. average glucose Bld gHb Est-mCnc: 140 mg/dL
Hgb A1c MFr Bld: 6.5 % — ABNORMAL HIGH (ref 4.8–5.6)

## 2015-11-27 NOTE — Telephone Encounter (Signed)
LMTCB

## 2015-11-27 NOTE — Telephone Encounter (Signed)
Pt advised.   Thanks,   -Josha Weekley  

## 2015-11-27 NOTE — Telephone Encounter (Signed)
-----   Message from Birdie Sons, MD sent at 11/27/2015  7:55 AM EDT ----- Blood sugar is borderline for diabetes. Need to cut back on sweets and starchy foods and increase exercise. Cholesterol is slightly elevated. Avoid red meats. Need to follow up to check A1c in 3-4 months.

## 2015-12-01 ENCOUNTER — Other Ambulatory Visit: Payer: Self-pay | Admitting: Family Medicine

## 2015-12-01 ENCOUNTER — Telehealth: Payer: Self-pay | Admitting: Family Medicine

## 2015-12-01 DIAGNOSIS — F40243 Fear of flying: Secondary | ICD-10-CM

## 2015-12-01 MED ORDER — LORAZEPAM 1 MG PO TABS: 1.0000 mg | ORAL_TABLET | Freq: Three times a day (TID) | ORAL | 1 refills | Status: DC | PRN

## 2015-12-01 NOTE — Telephone Encounter (Signed)
Left message on vm letting pt know lab results. Are ready to pick-up.

## 2015-12-01 NOTE — Telephone Encounter (Signed)
Pt is requesting a copy of last lab results.  CB#[id number]/MW

## 2015-12-28 ENCOUNTER — Other Ambulatory Visit: Payer: Self-pay | Admitting: Family Medicine

## 2016-03-16 ENCOUNTER — Ambulatory Visit: Payer: Medicare Other | Admitting: Anesthesiology

## 2016-03-16 ENCOUNTER — Encounter
Admission: RE | Disposition: A | Discharge status: Home / Self Care | Payer: Self-pay | Source: Ambulatory Visit | Attending: Unknown Physician Specialty

## 2016-03-16 ENCOUNTER — Encounter: Payer: Self-pay | Admitting: Anesthesiology

## 2016-03-16 ENCOUNTER — Ambulatory Visit
Admission: RE | Admit: 2016-03-16 | Discharge: 2016-03-16 | Disposition: A | Discharge status: Home / Self Care | Payer: Medicare Other | Source: Ambulatory Visit | Attending: Unknown Physician Specialty | Admitting: Unknown Physician Specialty

## 2016-03-16 DIAGNOSIS — Z7982 Long term (current) use of aspirin: Secondary | ICD-10-CM | POA: Insufficient documentation

## 2016-03-16 DIAGNOSIS — K219 Gastro-esophageal reflux disease without esophagitis: Secondary | ICD-10-CM | POA: Insufficient documentation

## 2016-03-16 DIAGNOSIS — K635 Polyp of colon: Secondary | ICD-10-CM | POA: Diagnosis not present

## 2016-03-16 DIAGNOSIS — Z1211 Encounter for screening for malignant neoplasm of colon: Secondary | ICD-10-CM | POA: Insufficient documentation

## 2016-03-16 DIAGNOSIS — Z87891 Personal history of nicotine dependence: Secondary | ICD-10-CM | POA: Diagnosis not present

## 2016-03-16 DIAGNOSIS — M1990 Unspecified osteoarthritis, unspecified site: Secondary | ICD-10-CM | POA: Insufficient documentation

## 2016-03-16 DIAGNOSIS — Z79899 Other long term (current) drug therapy: Secondary | ICD-10-CM | POA: Diagnosis not present

## 2016-03-16 DIAGNOSIS — I10 Essential (primary) hypertension: Secondary | ICD-10-CM | POA: Diagnosis not present

## 2016-03-16 DIAGNOSIS — K640 First degree hemorrhoids: Secondary | ICD-10-CM | POA: Insufficient documentation

## 2016-03-16 HISTORY — PX: COLONOSCOPY WITH PROPOFOL: SHX5780

## 2016-03-16 SURGERY — COLONOSCOPY WITH PROPOFOL
Anesthesia: General

## 2016-03-16 MED ORDER — SODIUM CHLORIDE 0.9 % IV SOLN
INTRAVENOUS | Status: DC
  Administered 2016-03-16: 10:00:00 via INTRAVENOUS

## 2016-03-16 MED ORDER — LACTATED RINGERS IV SOLN
INTRAVENOUS | Status: DC | PRN
  Administered 2016-03-16: 11:00:00 via INTRAVENOUS

## 2016-03-16 MED ORDER — SODIUM CHLORIDE 0.9 % IV SOLN: INTRAVENOUS | Status: DC

## 2016-03-16 MED ORDER — PROPOFOL 500 MG/50ML IV EMUL
INTRAVENOUS | Status: DC | PRN
  Administered 2016-03-16: 125 ug/kg/min via INTRAVENOUS

## 2016-03-16 MED ORDER — PROPOFOL 10 MG/ML IV BOLUS
INTRAVENOUS | Status: DC | PRN
  Administered 2016-03-16: 50 mg via INTRAVENOUS
  Administered 2016-03-16: 20 mg via INTRAVENOUS

## 2016-03-16 NOTE — Op Note (Signed)
[facility] Gastroenterology Patient Name: Garrett Green Procedure Date: 03/16/2016 11:14 AM MRN: [id number] Account #: [id number] Date of Birth: Aug 27, 1945 Admit Type: Outpatient Age: 70 Room: [facility] ENDO ROOM 4 Gender: Male Note Status: Finalized Procedure:            Colonoscopy Indications:          Screening for colorectal malignant neoplasm Providers:            Manya Silvas, MD Referring MD:         Kirstie Peri. Caryn Section, MD (Referring MD) Medicines:            Propofol per Anesthesia Complications:        No immediate complications. Procedure:            Pre-Anesthesia Assessment:                       - After reviewing the risks and benefits, the patient                        was deemed in satisfactory condition to undergo the                        procedure.                       After obtaining informed consent, the colonoscope was                        passed under direct vision. Throughout the procedure,                        the patient's blood pressure, pulse, and oxygen                        saturations were monitored continuously. The                        Colonoscope was introduced through the anus and                        advanced to the the cecum, identified by appendiceal                        orifice and ileocecal valve. The colonoscopy was                        performed without difficulty. The patient tolerated the                        procedure well. The quality of the bowel preparation                        was excellent. Findings:      A diminutive polyp was found in the ascending colon. The polyp was       sessile. The polyp was removed with a jumbo cold forceps. Resection and       retrieval were complete.      Internal hemorrhoids were found during endoscopy. The hemorrhoids were       small and Grade I (internal hemorrhoids that do not prolapse).      The exam  was otherwise without abnormality. Impression:           -  One diminutive polyp in the ascending colon, removed                        with a jumbo cold forceps. Resected and retrieved.                       - Internal hemorrhoids.                       - The examination was otherwise normal. Recommendation:       - Await pathology results. Manya Silvas, MD 03/16/2016 11:45:29 AM This report has been signed electronically. Number of Addenda: 0 Note Initiated On: 03/16/2016 11:14 AM Scope Withdrawal Time: 0 hours 7 minutes 13 seconds  Total Procedure Duration: 0 hours 12 minutes 21 seconds       [facility]

## 2016-03-16 NOTE — H&P (Signed)
Primary Care Physician:  Lelon Huh, MD Primary Gastroenterologist:  Dr. Vira Agar   Pre-Procedure History & Physical: HPI:  Garrett Green is a 70 y.o. male is here for an colonoscopy.   Past Medical History:  Diagnosis Date  . Arthritis    pt. reports its related to back only at this point   . History of chicken pox   . Hypertension    stress test- 8-9 yrs. ago, [facility], [facility]    Past Surgical History:  Procedure Laterality Date  . ESOPHAGEAL DILATION    . LUMBAR LAMINECTOMY/DECOMPRESSION MICRODISCECTOMY Left 07/26/2012   Procedure: LUMBAR LAMINECTOMY/DECOMPRESSION MICRODISCECTOMY 1 LEVEL;  Surgeon: Eustace Moore, MD;  Location: [place] [facility];  Service: Neurosurgery;  Laterality: Left;  lumbar five sacral one  . SHOULDER SURGERY     LEFT 12+ YEARS   . TONSILLECTOMY      Prior to Admission medications   Medication Sig Start Date End Date Taking? Authorizing Provider  aspirin 81 MG tablet Take 81 mg by mouth daily.   Yes Historical Provider, MD  fluticasone (FLONASE) 50 MCG/ACT nasal spray Place 2 sprays into both nostrils daily as needed for allergies. 10/27/14  Yes Birdie Sons, MD  HYDROcodone-acetaminophen (NORCO/VICODIN) 5-325 MG tablet Take 1 tablet by mouth every 6 (six) hours as needed. 11/26/15  Yes Birdie Sons, MD  loratadine (CLARITIN) 10 MG tablet Take 10 mg by mouth daily as needed for allergies.   Yes Historical Provider, MD  LORazepam (ATIVAN) 1 MG tablet Take 1 tablet (1 mg total) by mouth 3 (three) times daily as needed. 12/01/15  Yes Birdie Sons, MD  Omega-3 Fatty Acids (FISH OIL) 1200 MG CAPS Take 1,200 mg by mouth every other day.   Yes Historical Provider, MD  ramipril (ALTACE) 10 MG capsule Take 1 capsule by mouth  daily 12/29/15  Yes Birdie Sons, MD    Allergies as of 02/11/2016  . (No Known Allergies)    Family History  Problem Relation Age of Onset  . Heart disease Mother   . Cancer Sister   . Dementia Brother   . Dementia Sister   .  Diabetes Brother   . Diabetes Sister     Social History   Social History  . Marital status: Married    Spouse name: N/A  . Number of children: N/A  . Years of education: N/A   Occupational History  . Not on file.   Social History Main Topics  . Smoking status: Former Smoker    Packs/day: 1.00    Years: 15.00    Quit date: 10/10/1982  . Smokeless tobacco: Not on file     Comment: stopped smoking around age 36  . Alcohol use Yes     Comment: OCC   . Drug use: No  . Sexual activity: Not on file   Other Topics Concern  . Not on file   Social History Narrative  . No narrative on file    Review of Systems: See HPI, otherwise negative ROS  Physical Exam: BP (!) 155/76   Pulse 73   Temp 97.6 F (36.4 C) (Tympanic)   Resp 17   Ht 5\' 8"  (1.727 m)   Wt 86.2 kg (190 lb)   SpO2 100%   BMI 28.89 kg/m  General:   Alert,  pleasant and cooperative in NAD Head:  Normocephalic and atraumatic. Neck:  Supple; no masses or thyromegaly. Lungs:  Clear throughout to auscultation.    Heart:  Regular rate and rhythm. Abdomen:  Soft, nontender and nondistended. Normal bowel sounds, without guarding, and without rebound.   Neurologic:  Alert and  oriented x4;  grossly normal neurologically.  Impression/Plan: Garrett Green is here for an colonoscopy to be performed for screening.  Risks, benefits, limitations, and alternatives regarding  colonoscopy have been reviewed with the patient.  Questions have been answered.  All parties agreeable.   Gaylyn Cheers, MD  03/16/2016, 11:18 AM

## 2016-03-16 NOTE — Anesthesia Preprocedure Evaluation (Signed)
Anesthesia Evaluation  Patient identified by MRN, date of birth, ID band Patient awake    Reviewed: Allergy & Precautions, H&P , NPO status , Patient's Chart, lab work & pertinent test results, reviewed documented beta blocker date and time   History of Anesthesia Complications Negative for: history of anesthetic complications  Airway Mallampati: I  TM Distance: >3 FB Neck ROM: full    Dental no notable dental hx. (+) Caps, Teeth Intact   Pulmonary neg pulmonary ROS, former smoker,    Pulmonary exam normal breath sounds clear to auscultation       Cardiovascular Exercise Tolerance: Good hypertension, (-) angina(-) CAD, (-) Past MI, (-) Cardiac Stents and (-) CABG Normal cardiovascular exam(-) dysrhythmias (-) Valvular Problems/Murmurs Rhythm:regular Rate:Normal     Neuro/Psych negative neurological ROS  negative psych ROS   GI/Hepatic Neg liver ROS, GERD  ,  Endo/Other  negative endocrine ROS  Renal/GU negative Renal ROS  negative genitourinary   Musculoskeletal   Abdominal   Peds  Hematology negative hematology ROS (+)   Anesthesia Other Findings Past Medical History: No date: Arthritis     Comment: pt. reports its related to back only at this               point  No date: History of chicken pox No date: Hypertension     Comment: stress test- 8-9 yrs. ago, [facility], [facility]   Reproductive/Obstetrics negative OB ROS                             Anesthesia Physical Anesthesia Plan  ASA: II  Anesthesia Plan: General   Post-op Pain Management:    Induction:   Airway Management Planned:   Additional Equipment:   Intra-op Plan:   Post-operative Plan:   Informed Consent: I have reviewed the patients History and Physical, chart, labs and discussed the procedure including the risks, benefits and alternatives for the proposed anesthesia with the patient or authorized representative who  has indicated his/her understanding and acceptance.   Dental Advisory Given  Plan Discussed with: Anesthesiologist, CRNA and Surgeon  Anesthesia Plan Comments:         Anesthesia Quick Evaluation

## 2016-03-16 NOTE — Anesthesia Postprocedure Evaluation (Signed)
Anesthesia Post Note  Patient: Garrett Green  Procedure(s) Performed: Procedure(s) (LRB): COLONOSCOPY WITH PROPOFOL (N/A)  Patient location during evaluation: Endoscopy Anesthesia Type: General Level of consciousness: awake and alert Pain management: pain level controlled Vital Signs Assessment: post-procedure vital signs reviewed and stable Respiratory status: spontaneous breathing, nonlabored ventilation, respiratory function stable and patient connected to nasal cannula oxygen Cardiovascular status: blood pressure returned to baseline and stable Postop Assessment: no signs of nausea or vomiting Anesthetic complications: no    Last Vitals:  Vitals:   03/16/16 1208 03/16/16 1218  BP: 139/88 140/75  Pulse: 78 68  Resp: 16 16  Temp:      Last Pain:  Vitals:   03/16/16 1148  TempSrc: Tympanic                 Martha Clan

## 2016-03-16 NOTE — Transfer of Care (Signed)
Immediate Anesthesia Transfer of Care Note  Patient: Garrett Green  Procedure(s) Performed: Procedure(s): COLONOSCOPY WITH PROPOFOL (N/A)  Patient Location: PACU and Endoscopy Unit  Anesthesia Type:General  Level of Consciousness: awake, alert  and oriented  Airway & Oxygen Therapy: Patient Spontanous Breathing and Patient connected to nasal cannula oxygen  Post-op Assessment: Report given to RN and Post -op Vital signs reviewed and stable  Post vital signs: Reviewed and stable  Last Vitals:  Vitals:   03/16/16 1017 03/16/16 1148  BP: (!) 155/76 (!) (P) 108/51  Pulse: 73 (P) 77  Resp: 17 (P) 16  Temp: 36.4 C (!) (P) 35.8 C    Last Pain:  Vitals:   03/16/16 1148  TempSrc: (P) Tympanic         Complications: No apparent anesthesia complications

## 2016-03-17 ENCOUNTER — Encounter: Payer: Self-pay | Admitting: Unknown Physician Specialty

## 2016-03-17 LAB — SURGICAL PATHOLOGY

## 2016-04-12 ENCOUNTER — Telehealth: Payer: Self-pay | Admitting: Family Medicine

## 2016-04-12 DIAGNOSIS — M47816 Spondylosis without myelopathy or radiculopathy, lumbar region: Secondary | ICD-10-CM

## 2016-04-12 NOTE — Telephone Encounter (Signed)
Pt contacted office for refill request on the following medications:  HYDROcodone-acetaminophen (NORCO/VICODIN) 5-325 MG tablet.  CB#[id number]/MW

## 2016-04-12 NOTE — Telephone Encounter (Signed)
Hold for Dr Caryn Section

## 2016-04-13 MED ORDER — HYDROCODONE-ACETAMINOPHEN 5-325 MG PO TABS: 1.0000 | ORAL_TABLET | Freq: Four times a day (QID) | ORAL | 0 refills | Status: DC | PRN

## 2016-09-16 ENCOUNTER — Other Ambulatory Visit: Payer: Self-pay | Admitting: Family Medicine

## 2016-09-16 DIAGNOSIS — M47816 Spondylosis without myelopathy or radiculopathy, lumbar region: Secondary | ICD-10-CM

## 2016-09-16 MED ORDER — HYDROCODONE-ACETAMINOPHEN 5-325 MG PO TABS: 1.0000 | ORAL_TABLET | Freq: Four times a day (QID) | ORAL | 0 refills | Status: DC | PRN

## 2016-09-16 NOTE — Telephone Encounter (Signed)
Pt needs refill on his  HYDROcodone-acetaminophen (NORCO/VICODIN) 5-325 MG tablet  Thanks teri

## 2016-11-29 ENCOUNTER — Encounter: Payer: Medicare Other | Admitting: Family Medicine

## 2016-11-30 ENCOUNTER — Encounter: Payer: Self-pay | Admitting: Family Medicine

## 2016-11-30 ENCOUNTER — Ambulatory Visit (INDEPENDENT_AMBULATORY_CARE_PROVIDER_SITE_OTHER): Payer: Medicare Other | Admitting: Family Medicine

## 2016-11-30 VITALS — BP 130/80 | HR 56 | Temp 98.0°F | Resp 16 | Ht 68.0 in | Wt 199.0 lb

## 2016-11-30 DIAGNOSIS — Z8249 Family history of ischemic heart disease and other diseases of the circulatory system: Secondary | ICD-10-CM

## 2016-11-30 DIAGNOSIS — Z23 Encounter for immunization: Secondary | ICD-10-CM | POA: Diagnosis not present

## 2016-11-30 DIAGNOSIS — R7303 Prediabetes: Secondary | ICD-10-CM | POA: Insufficient documentation

## 2016-11-30 DIAGNOSIS — F40243 Fear of flying: Secondary | ICD-10-CM | POA: Diagnosis not present

## 2016-11-30 DIAGNOSIS — M47816 Spondylosis without myelopathy or radiculopathy, lumbar region: Secondary | ICD-10-CM

## 2016-11-30 DIAGNOSIS — I10 Essential (primary) hypertension: Secondary | ICD-10-CM

## 2016-11-30 DIAGNOSIS — Z6830 Body mass index (BMI) 30.0-30.9, adult: Secondary | ICD-10-CM | POA: Diagnosis not present

## 2016-11-30 DIAGNOSIS — Z125 Encounter for screening for malignant neoplasm of prostate: Secondary | ICD-10-CM

## 2016-11-30 DIAGNOSIS — Z0000 Encounter for general adult medical examination without abnormal findings: Secondary | ICD-10-CM | POA: Diagnosis not present

## 2016-11-30 MED ORDER — FLUTICASONE PROPIONATE 50 MCG/ACT NA SUSP
2.0000 | Freq: Every day | NASAL | 1 refills | Status: AC | PRN
Start: 2016-11-30 — End: ?

## 2016-11-30 MED ORDER — LORAZEPAM 1 MG PO TABS: 1.0000 mg | ORAL_TABLET | Freq: Three times a day (TID) | ORAL | 1 refills | Status: DC | PRN

## 2016-11-30 NOTE — Progress Notes (Signed)
Patient: EESA JUSTISS, Male    DOB: 1945/11/22, 71 y.o.   MRN: [id number] Visit Date: 11/30/2016  Today's Provider: Lelon Huh, MD   Chief Complaint  Patient presents with  . Annual Exam  . Hypertension   Subjective:    Annual physical DIONE MCCOMBIE is a 71 y.o. male. He feels well. He reports exercising yes/about 30 mons daily. He reports he is sleeping fairly well.  -----------------------------------------------------------   Hypertension, follow-up:  BP Readings from Last 3 Encounters:  11/30/16 130/80  03/16/16 140/75  11/26/15 118/64    He was last seen for hypertension 1 years ago.  BP at that visit was 118/64. Management since that visit includes; no changes.He reports good compliance with treatment. He is not having side effects. none He is exercising. He is adherent to low salt diet.   Outside blood pressures are 140/85. He is experiencing none.  Patient denies none.   Cardiovascular risk factors include none.  Use of agents associated with hypertension: none.   ----------------------------------------------------------------   Hyperglycemia From 11/26/2015-A1c was 6.5%.  Advised to improve diet and exercise. Avoids sugary drinks. Tries to stay away for sweet and starchy foods.    Spondylosis of lumbar region without myelopathy or radiculopathy From 11/26/2015-no changes. Take 1-2 hydrocodone/apap most days and working well.    Review of Systems  Constitutional: Negative for chills, diaphoresis and fever.  HENT: Positive for sinus pressure. Negative for congestion, ear discharge, ear pain, hearing loss, nosebleeds, sore throat and tinnitus.   Eyes: Negative for photophobia, pain, discharge and redness.  Respiratory: Negative for cough, shortness of breath, wheezing and stridor.   Cardiovascular: Negative for chest pain, palpitations and leg swelling.  Gastrointestinal: Negative for abdominal pain, blood in stool, constipation, diarrhea,  nausea and vomiting.  Endocrine: Negative for polydipsia.  Genitourinary: Negative for dysuria, flank pain, frequency, hematuria and urgency.  Musculoskeletal: Negative for back pain, myalgias and neck pain.  Skin: Negative for rash.  Allergic/Immunologic: Negative for environmental allergies.  Neurological: Negative for dizziness, tremors, seizures, weakness and headaches.  Hematological: Does not bruise/bleed easily.  Psychiatric/Behavioral: Negative for hallucinations and suicidal ideas. The patient is not nervous/anxious.   All other systems reviewed and are negative.   Social History   Social History  . Marital status: Married    Spouse name: N/A  . Number of children: N/A  . Years of education: N/A   Occupational History  . Not on file.   Social History Main Topics  . Smoking status: Former Smoker    Packs/day: 1.00    Years: 15.00    Quit date: 10/10/1982  . Smokeless tobacco: Former Systems developer     Comment: stopped smoking around age 30  . Alcohol use Yes     Comment: OCC   . Drug use: No  . Sexual activity: Not on file   Other Topics Concern  . Not on file   Social History Narrative  . No narrative on file    Past Medical History:  Diagnosis Date  . Arthritis    pt. reports its related to back only at this point   . History of chicken pox   . Hypertension    stress test- 8-9 yrs. ago, [facility], [facility]     Patient Active Problem List   Diagnosis Date Noted  . Degenerative arthritis of spine 10/22/2014  . Dysphagia 10/22/2014  . Insomnia 10/22/2014  . Fam hx-ischem heart disease 08/13/2007  . Fear of flying  08/13/2007  . Arthralgia of hip or thigh 09/26/2006  . Allergic rhinitis 09/17/2001  . GERD (gastroesophageal reflux disease) 11/14/1994  . Essential (primary) hypertension 11/14/1994    Past Surgical History:  Procedure Laterality Date  . COLONOSCOPY WITH PROPOFOL N/A 03/16/2016   Procedure: COLONOSCOPY WITH PROPOFOL;  Surgeon: Manya Silvas, MD;   Location: [facility] ENDOSCOPY;  Service: Endoscopy;  Laterality: N/A;  . ESOPHAGEAL DILATION    . LUMBAR LAMINECTOMY/DECOMPRESSION MICRODISCECTOMY Left 07/26/2012   Procedure: LUMBAR LAMINECTOMY/DECOMPRESSION MICRODISCECTOMY 1 LEVEL;  Surgeon: Eustace Moore, MD;  Location: [facility] NEURO ORS;  Service: Neurosurgery;  Laterality: Left;  lumbar five sacral one  . SHOULDER SURGERY     LEFT 12+ YEARS   . TONSILLECTOMY      His family history includes Cancer in his sister; Dementia in his brother and sister; Diabetes in his brother and sister; Heart disease in his mother.      Current Outpatient Prescriptions:  .  aspirin 81 MG tablet, Take 81 mg by mouth daily., Disp: , Rfl:  .  fluticasone (FLONASE) 50 MCG/ACT nasal spray, Place 2 sprays into both nostrils daily as needed for allergies., Disp: 48 g, Rfl: 1 .  HYDROcodone-acetaminophen (NORCO/VICODIN) 5-325 MG tablet, Take 1 tablet by mouth every 6 (six) hours as needed., Disp: 120 tablet, Rfl: 0 .  loratadine (CLARITIN) 10 MG tablet, Take 10 mg by mouth daily as needed for allergies., Disp: , Rfl:  .  LORazepam (ATIVAN) 1 MG tablet, Take 1 tablet (1 mg total) by mouth 3 (three) times daily as needed., Disp: 1 tablet, Rfl: 1 .  Omega-3 Fatty Acids (FISH OIL) 1200 MG CAPS, Take 1,200 mg by mouth every other day., Disp: , Rfl:  .  ramipril (ALTACE) 10 MG capsule, Take 1 capsule by mouth  daily, Disp: 90 capsule, Rfl: 4  Patient Care Team: Birdie Sons, MD as PCP - General (Family Medicine) Oh, Lupita Dawn, MD (Inactive) as Consulting Physician (Gastroenterology)     Objective:   Vitals: BP 130/80 (BP Location: Right Arm, Patient Position: Sitting, Cuff Size: Large)   Pulse (!) 56   Temp 98 F (36.7 C) (Oral)   Resp 16   Ht 5\' 8"  (1.727 m)   Wt 199 lb (90.3 kg)   SpO2 93%   BMI 30.26 kg/m   Physical Exam   General Appearance:    Alert, cooperative, no distress, appears stated age, overweight  Head:    Normocephalic, without obvious  abnormality, atraumatic  Eyes:    PERRL, conjunctiva/corneas clear, EOM's intact, fundi    benign, both eyes       Ears:    Normal TM's and external ear canals, both ears  Nose:   Nares normal, septum midline, mucosa normal, no drainage   or sinus tenderness  Throat:   Lips, mucosa, and tongue normal; teeth and gums normal  Neck:   Supple, symmetrical, trachea midline, no adenopathy;       thyroid:  No enlargement/tenderness/nodules; no carotid   bruit or JVD  Back:     Symmetric, no curvature, ROM normal, no CVA tenderness  Lungs:     Clear to auscultation bilaterally, respirations unlabored  Chest wall:    No tenderness or deformity  Heart:    Regular rate and rhythm, S1 and S2 normal, no murmur, rub   or gallop  Abdomen:     Soft, non-tender, bowel sounds active all four quadrants,    no masses, no organomegaly  Genitalia:  deferred  Rectal:    deferred  Extremities:   Extremities normal, atraumatic, no cyanosis or edema  Pulses:   2+ and symmetric all extremities  Skin:   Skin color, texture, turgor normal, no rashes or lesions  Lymph nodes:   Cervical, supraclavicular, and axillary nodes normal  Neurologic:   CNII-XII intact. Normal strength, sensation and reflexes      throughout     Activities of Daily Living In your present state of health, do you have any difficulty performing the following activities: 11/30/2016  Hearing? N  Vision? N  Difficulty concentrating or making decisions? N  Walking or climbing stairs? N  Dressing or bathing? N  Doing errands, shopping? N  Some recent data might be hidden    Fall Risk Assessment Fall Risk  11/30/2016 11/26/2015 10/28/2015 10/27/2014  Falls in the past year? No No No No     Depression Screen PHQ 2/9 Scores 11/30/2016 11/26/2015 10/28/2015 10/27/2014  PHQ - 2 Score 0 0 0 0  PHQ- 9 Score 2 0 0 -    Cognitive Testing - 6-CIT Patient declined test.   Audit-C Alcohol Use Screening  Question Answer Points  How often do you  have alcoholic drink? 1 or more times weekly 2  On days you do drink alcohol, how many drinks do you typically consume? 1 or 2 1  How oftey will you drink 6 or more in a total? never 0  Total Score:  3   A score of 3 or more in women, and 4 or more in men indicates increased risk for alcohol abuse, EXCEPT if all of the points are from question 1.      Assessment & Plan:    Annual Physical Reviewed patient's Family Medical History Reviewed and updated list of patient's medical providers Assessment of cognitive impairment was done Assessed patient's functional ability Established a written schedule for health screening [place] Completed and Reviewed  Exercise Activities and Dietary recommendations Goals    None      Immunization History  Administered Date(s) Administered  . Pneumococcal Conjugate-13 10/23/2013  . Pneumococcal Polysaccharide-23 10/27/2014  . Td 11/26/2015  . Tdap 06/20/2005  . Zoster 09/02/2009    Health Maintenance  Topic Date Due  . INFLUENZA VACCINE  11/09/2016  . TETANUS/TDAP  11/25/2025  . COLONOSCOPY  03/16/2026  . Hepatitis C Screening  Completed  . PNA vac Low Risk Adult  Completed     Discussed health benefits of physical activity, and encouraged him to engage in regular exercise appropriate for his age and condition.    ------------------------------------------------------------------------------------------------------------   The entirety of the information documented in the History of Present Illness, Review of Systems and Physical Exam were personally obtained by me. Portions of this information were initially documented by April M. Sabra Heck, CMA and reviewed by me for thoroughness and accuracy.   1. Annual physical exam  - Renal function panel - Lipid panel - Hemoglobin A1c - PSA - EKG 12-Lead  2. BMI 30.0-30.9,adult Nutritional counseling.   3. Prostate cancer screening  - PSA  4. Essential  (primary) hypertension Well controlled.  Continue current medications.   - Renal function panel - Lipid panel - EKG 12-Lead  5. Prediabetes  - Lipid panel - Hemoglobin A1c  6. Fam hx-ischem heart disease   7. Spondylosis of lumbar region without myelopathy or radiculopathy Doing well with current regiment of medications. Encourage regular exercise.   8. Fear of flying Does well with  -  LORazepam (ATIVAN) 1 MG tablet; Take 1 tablet (1 mg total) by mouth 3 (three) times daily as needed.  Dispense: 10 tablet; Refill: 1  9. Need for influenza vaccination  - Flu vaccine HIGH DOSE PF    Lelon Huh, MD  [facility]

## 2016-11-30 NOTE — Patient Instructions (Addendum)
   The CDC recommends two doses of Shingrix (the shingles vaccine) separated by 2 to 6 months for adults age 69 years and older. I recommend checking with your insurance plan regarding coverage for this vaccine.    Prediabetes Eating Plan Prediabetes-also called impaired glucose tolerance or impaired fasting glucose-is a condition that causes blood sugar (blood glucose) levels to be higher than normal. Following a healthy diet can help to keep prediabetes under control. It can also help to lower the risk of type 2 diabetes and heart disease, which are increased in people who have prediabetes. Along with regular exercise, a healthy diet:  Promotes weight loss.  Helps to control blood sugar levels.  Helps to improve the way that the body uses insulin.  What do I need to know about this eating plan?  Use the glycemic index (GI) to plan your meals. The index tells you how quickly a food will raise your blood sugar. Choose low-GI foods. These foods take a longer time to raise blood sugar.  Pay close attention to the amount of carbohydrates in the food that you eat. Carbohydrates increase blood sugar levels.  Keep track of how many calories you take in. Eating the right amount of calories will help you to achieve a healthy weight. Losing about 7 percent of your starting weight can help to prevent type 2 diabetes.  You may want to follow a Mediterranean diet. This diet includes a lot of vegetables, lean meats or fish, whole grains, fruits, and healthy oils and fats. What foods can I eat? Grains Whole grains, such as whole-wheat or whole-grain breads, crackers, cereals, and pasta. Unsweetened oatmeal. Bulgur. Barley. Quinoa. Brown rice. Corn or whole-wheat flour tortillas or taco shells. Vegetables Lettuce. Spinach. Peas. Beets. Cauliflower. Cabbage. Broccoli. Carrots. Tomatoes. Squash. Eggplant. Herbs. Peppers. Onions. Cucumbers. Brussels sprouts. Fruits Berries. Bananas. Apples. Oranges. Grapes.  Papaya. Mango. Pomegranate. Kiwi. Grapefruit. Cherries. Meats and Other Protein Sources Seafood. Lean meats, such as chicken and [place] or lean cuts of pork and beef. Tofu. Eggs. Nuts. Beans. Dairy Low-fat or fat-free dairy products, such as yogurt, cottage cheese, and cheese. Beverages Water. Tea. Coffee. Sugar-free or diet soda. Seltzer water. Milk. Milk alternatives, such as soy or almond milk. Condiments Mustard. Relish. Low-fat, low-sugar ketchup. Low-fat, low-sugar barbecue sauce. Low-fat or fat-free mayonnaise. Sweets and Desserts Sugar-free or low-fat pudding. Sugar-free or low-fat ice cream and other frozen treats. Fats and Oils Avocado. Walnuts. Olive oil. The items listed above may not be a complete list of recommended foods or beverages. Contact your dietitian for more options. What foods are not recommended? Grains Refined white flour and flour products, such as bread, pasta, snack foods, and cereals. Beverages Sweetened drinks, such as sweet iced tea and soda. Sweets and Desserts Baked goods, such as cake, cupcakes, pastries, cookies, and cheesecake. The items listed above may not be a complete list of foods and beverages to avoid. Contact your dietitian for more information. This information is not intended to replace advice given to you by your health care provider. Make sure you discuss any questions you have with your health care provider. Document Released: 08/12/2014 Document Revised: 09/03/2015 Document Reviewed: 04/23/2014 Elsevier Interactive Patient Education  2017 [employer].

## 2016-12-01 ENCOUNTER — Telehealth: Payer: Self-pay | Admitting: Family Medicine

## 2016-12-01 LAB — LIPID PANEL
Chol/HDL Ratio: 3.8 ratio (ref 0.0–5.0)
Cholesterol, Total: 179 mg/dL (ref 100–199)
HDL: 47 mg/dL (ref >39)
LDL Calculated: 105 mg/dL — ABNORMAL HIGH (ref 0–99)
Triglycerides: 135 mg/dL (ref 0–149)
VLDL Cholesterol Cal: 27 mg/dL (ref 5–40)

## 2016-12-01 LAB — RENAL FUNCTION PANEL
Albumin: 4.3 g/dL (ref 3.6–4.8)
BUN/Creatinine Ratio: 10 (ref 10–24)
BUN: 11 mg/dL (ref 8–27)
CO2: 24 mmol/L (ref 20–29)
Calcium: 9.3 mg/dL (ref 8.6–10.2)
Chloride: 105 mmol/L (ref 96–106)
Creatinine, Ser: 1.07 mg/dL (ref 0.76–1.27)
GFR calc Af Amer: 82 mL/min/{1.73_m2} (ref >59)
GFR calc non Af Amer: 71 mL/min/{1.73_m2} (ref >59)
Glucose: 113 mg/dL — ABNORMAL HIGH (ref 65–99)
Phosphorus: 2.4 mg/dL — ABNORMAL LOW (ref 2.5–4.5)
Potassium: 4.8 mmol/L (ref 3.5–5.2)
Sodium: 141 mmol/L (ref 134–144)

## 2016-12-01 LAB — HEMOGLOBIN A1C
Est. average glucose Bld gHb Est-mCnc: 126 mg/dL
Hgb A1c MFr Bld: 6 % — ABNORMAL HIGH (ref 4.8–5.6)

## 2016-12-01 LAB — PSA: Prostate Specific Ag, Serum: 0.6 ng/mL (ref 0.0–4.0)

## 2016-12-01 NOTE — Telephone Encounter (Signed)
Pt is returning call.  CB#[phone]/MW

## 2016-12-01 NOTE — Telephone Encounter (Signed)
-----   Message from Birdie Sons, MD sent at 12/01/2016  8:07 AM EDT ----- Average Blood sugar is still in pre-diabetic range, but is better, down from 140 to 126. PSA is normal.  Cholesterol is pretty good at 179. Continue current medications.  Please schedule follow up in 6 months for follow up BP and sugar.

## 2016-12-01 NOTE — Telephone Encounter (Signed)
LMTCB

## 2016-12-05 NOTE — Telephone Encounter (Signed)
Patient was notified results. Expressed understanding.  

## 2017-01-24 ENCOUNTER — Other Ambulatory Visit: Payer: Self-pay | Admitting: Family Medicine

## 2017-01-25 ENCOUNTER — Other Ambulatory Visit: Payer: Self-pay | Admitting: Family Medicine

## 2017-01-25 DIAGNOSIS — M47816 Spondylosis without myelopathy or radiculopathy, lumbar region: Secondary | ICD-10-CM

## 2017-01-25 MED ORDER — HYDROCODONE-ACETAMINOPHEN 5-325 MG PO TABS: 1.0000 | ORAL_TABLET | Freq: Four times a day (QID) | ORAL | 0 refills | Status: DC | PRN

## 2017-01-25 NOTE — Telephone Encounter (Signed)
Patient needs a refill on HYDROcodone-acetaminophen (NORCO/VICODIN) 5-325 MG tablet.  Please call patient when ready for pick up.

## 2017-05-30 ENCOUNTER — Other Ambulatory Visit: Payer: Self-pay | Admitting: Family Medicine

## 2017-05-30 DIAGNOSIS — M47816 Spondylosis without myelopathy or radiculopathy, lumbar region: Secondary | ICD-10-CM

## 2017-05-30 MED ORDER — HYDROCODONE-ACETAMINOPHEN 5-325 MG PO TABS: 1.0000 | ORAL_TABLET | Freq: Four times a day (QID) | ORAL | 0 refills | Status: DC | PRN

## 2017-05-30 NOTE — Telephone Encounter (Signed)
Pt's wife requesting refill of Hydrocodone 5-325 sent to [facility] in [place].

## 2017-10-04 ENCOUNTER — Other Ambulatory Visit: Payer: Self-pay | Admitting: *Deleted

## 2017-10-04 DIAGNOSIS — M47816 Spondylosis without myelopathy or radiculopathy, lumbar region: Secondary | ICD-10-CM

## 2017-10-04 MED ORDER — HYDROCODONE-ACETAMINOPHEN 5-325 MG PO TABS: 1.0000 | ORAL_TABLET | Freq: Four times a day (QID) | ORAL | 0 refills | Status: DC | PRN

## 2017-10-16 ENCOUNTER — Ambulatory Visit (INDEPENDENT_AMBULATORY_CARE_PROVIDER_SITE_OTHER): Payer: Medicare Other | Admitting: Family Medicine

## 2017-10-16 ENCOUNTER — Encounter: Payer: Self-pay | Admitting: Family Medicine

## 2017-10-16 VITALS — BP 160/60 | HR 91 | Temp 98.0°F | Wt 197.2 lb

## 2017-10-16 DIAGNOSIS — R209 Unspecified disturbances of skin sensation: Secondary | ICD-10-CM | POA: Diagnosis not present

## 2017-10-16 DIAGNOSIS — R42 Dizziness and giddiness: Secondary | ICD-10-CM

## 2017-10-16 DIAGNOSIS — R0789 Other chest pain: Secondary | ICD-10-CM | POA: Diagnosis not present

## 2017-10-16 DIAGNOSIS — IMO0001 Reserved for inherently not codable concepts without codable children: Secondary | ICD-10-CM

## 2017-10-16 NOTE — Progress Notes (Signed)
Patient: Garrett Green Male    DOB: Mar 05, 1946   72 y.o.   MRN: [id number] Visit Date: 10/16/2017  Today's Provider: Vernie Murders, PA   Chief Complaint  Patient presents with  . Dizziness  . Numbness   Subjective:    Dizziness  This is a new problem. Episode onset: Friday 10/13/17. Episode frequency: episodes daily since Friday. The problem has been gradually worsening. Associated symptoms include numbness (left arm and left side of face). Associated symptoms comments: Tingling in lips . Exacerbated by: movement. He has tried nothing for the symptoms.   Past Medical History:  Diagnosis Date  . Arthritis    pt. reports its related to back only at this point   . History of chicken pox   . Hypertension    stress test- 8-9 yrs. ago, [facility], [facility]   Patient Active Problem List   Diagnosis Date Noted  . Prediabetes 11/30/2016  . Spondylosis 10/22/2014  . Dysphagia 10/22/2014  . Insomnia 10/22/2014  . Fam hx-ischem heart disease 08/13/2007  . 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 Arthralgia of hip or thigh 09/26/2006  . Pain in hip 09/26/2006  . Allergic rhinitis 09/17/2001  . GERD (gastroesophageal reflux disease) 11/14/1994  . Essential (primary) hypertension 11/14/1994   Past Surgical History:  Procedure Laterality Date  . COLONOSCOPY WITH PROPOFOL N/A 03/16/2016   Procedure: COLONOSCOPY WITH PROPOFOL;  Surgeon: Manya Silvas, MD;  Location: [facility] ENDOSCOPY;  Service: Endoscopy;  Laterality: N/A;  . ESOPHAGEAL DILATION    . LUMBAR LAMINECTOMY/DECOMPRESSION MICRODISCECTOMY Left 07/26/2012   Procedure: LUMBAR LAMINECTOMY/DECOMPRESSION MICRODISCECTOMY 1 LEVEL;  Surgeon: Eustace Moore, MD;  Location: [place] NEURO ORS;  Service: Neurosurgery;  Laterality: Left;  lumbar five sacral one  . SHOULDER SURGERY     LEFT 12+ YEARS   . TONSILLECTOMY     Family History  Problem Relation Age of Onset  . Heart disease Mother   . Cancer Sister   . Dementia Brother   . Dementia Sister   .  Diabetes Brother   . Diabetes Sister    No Known Allergies  Current Outpatient Medications:  .  ALPHAGAN P 0.1 % SOLN, , Disp: , Rfl:  .  aspirin 81 MG tablet, Take 81 mg by mouth daily., Disp: , Rfl:  .  brimonidine (ALPHAGAN) 0.15 % ophthalmic solution, PLACE 1 DROP INTO LEFT EYE TWICE A DAY, Disp: , Rfl: 11 .  fluticasone (FLONASE) 50 MCG/ACT nasal spray, Place 2 sprays into both nostrils daily as needed for allergies., Disp: 48 g, Rfl: 1 .  HYDROcodone-acetaminophen (NORCO/VICODIN) 5-325 MG tablet, Take 1 tablet by mouth every 6 (six) hours as needed., Disp: 120 tablet, Rfl: 0 .  loratadine (CLARITIN) 10 MG tablet, Take 10 mg by mouth daily as needed for allergies., Disp: , Rfl:  .  LORazepam (ATIVAN) 1 MG tablet, Take 1 tablet (1 mg total) by mouth 3 (three) times daily as needed., Disp: 10 tablet, Rfl: 1 .  ramipril (ALTACE) 10 MG capsule, TAKE 1 CAPSULE BY MOUTH  DAILY, Disp: 90 capsule, Rfl: 4 .  Omega-3 Fatty Acids (FISH OIL) 1200 MG CAPS, Take 1,200 mg by mouth every other day., Disp: , Rfl:   Review of Systems  Constitutional: Negative.   Respiratory: Negative.   Cardiovascular: Negative.   Neurological: Positive for dizziness and numbness (left arm and left side of face).   Social History   Tobacco Use  . Smoking status: Former Smoker    Packs/day:  1.00    Years: 15.00    Pack years: 15.00    Last attempt to quit: 10/10/1982    Years since quitting: 35.0  . Smokeless tobacco: Former Systems developer  . Tobacco comment: stopped smoking around age 78  Substance Use Topics  . Alcohol use: Yes    Comment: OCC    Objective:   BP (!) 160/60 (BP Location: Right Arm, Patient Position: Sitting, Cuff Size: Normal)   Pulse 91   Temp 98 F (36.7 C) (Oral)   Wt 197 lb 3.2 oz (89.4 kg)   SpO2 95%   BMI 29.98 kg/m  BP 160/90 supine, sitting and standing in the left arm. BP Readings from Last 3 Encounters:  10/16/17 (!) 160/60  11/30/16 130/80  03/16/16 140/75   Wt Readings from Last  3 Encounters:  10/16/17 197 lb 3.2 oz (89.4 kg)  11/30/16 199 lb (90.3 kg)  03/16/16 190 lb (86.2 kg)   Physical Exam  Constitutional: He is oriented to person, place, and time. He appears well-developed and well-nourished.  HENT:  Head: Normocephalic and atraumatic.  Right Ear: External ear normal.  Left Ear: External ear normal.  Nose: Nose normal.  Mouth/Throat: Oropharynx is clear and moist.  Eyes: Pupils are equal, round, and reactive to light. Conjunctivae and EOM are normal. Right eye exhibits no discharge.  Neck: Normal range of motion. Neck supple. No tracheal deviation present. No thyromegaly present.  Cardiovascular: Normal rate, regular rhythm, normal heart sounds and intact distal pulses.  No murmur heard. Pulmonary/Chest: Effort normal and breath sounds normal. No respiratory distress. He has no wheezes. He has no rales. He exhibits no tenderness.  Abdominal: Soft. He exhibits no distension and no mass. There is no tenderness. There is no rebound and no guarding.  Musculoskeletal: Normal range of motion. He exhibits no edema or tenderness.  Lymphadenopathy:    He has no cervical adenopathy.  Neurological: He is alert and oriented to person, place, and time. He has normal reflexes. He displays normal reflexes. No cranial nerve deficit. He exhibits normal muscle tone. Coordination normal.  Skin: Skin is warm and dry. No rash noted. No erythema.  Psychiatric: He has a normal mood and affect. His behavior is normal. Judgment and thought content normal.      Assessment & Plan:     1. Chest pressure Onset 10-13-17 after a treadmill workout and having tingling and light headed sensation episodes. No dyspnea or palpitations. EKG NSR without acute changes. BP slightly elevated despite daily use of Ramipril 10 mg qd. History of heavy sweating when outdoors in the extreme heat. Was doing some bush hogging last week for an hour or two. Will check labs and encouraged to drink extra  fluids. May take Lorazepam once today for any anxiety component. Advised to go to the ER if this persists or worsens. Prefers to stay out of the ER at this time. - EKG 12-Lead - CBC with Differential/Platelet - Comprehensive metabolic panel  2. Light-headed feeling Short lived episodes that can progress to tingling/numbness as stated below. Will check labs for anemia, infection, dehydration or electrolyte imbalance. Encouraged to drink extra fluids. Recheck pending lab reports. - CBC with Differential/Platelet - Comprehensive metabolic panel  3. Paresthesias/numbness Intermittent and short lived episodes that started after a treadmill workout Friday (10-13-17) at a neighbor's house. Felt tingling around lips, left cheek and left arm. No muscle weakness, dysphagia, headache, dyspnea, palpitations,nausea or vomiting. History of some anxiety/claustrophobia, but only when flying. May use  Lorazepam today (once) and will get labs. Advised to go to ER if symptoms worsen or muscle weakness occurs. - CBC with Differential/Platelet - Comprehensive metabolic panel       Vernie Murders, PA  [facility]

## 2017-10-17 ENCOUNTER — Observation Stay: Payer: Medicare Other

## 2017-10-17 ENCOUNTER — Emergency Department: Payer: Medicare Other

## 2017-10-17 ENCOUNTER — Telehealth: Payer: Self-pay

## 2017-10-17 ENCOUNTER — Observation Stay (HOSPITAL_BASED_OUTPATIENT_CLINIC_OR_DEPARTMENT_OTHER)
Admit: 2017-10-17 | Discharge: 2017-10-17 | Disposition: A | Discharge status: Home / Self Care | Payer: Medicare Other | Attending: Internal Medicine | Admitting: Internal Medicine

## 2017-10-17 ENCOUNTER — Observation Stay
Admission: EM | Admit: 2017-10-17 | Discharge: 2017-10-18 | Disposition: A | Discharge status: Home / Self Care | Payer: Medicare Other | Attending: Internal Medicine | Admitting: Internal Medicine

## 2017-10-17 ENCOUNTER — Encounter: Payer: Self-pay | Admitting: Internal Medicine

## 2017-10-17 ENCOUNTER — Other Ambulatory Visit: Payer: Self-pay

## 2017-10-17 DIAGNOSIS — Z8249 Family history of ischemic heart disease and other diseases of the circulatory system: Secondary | ICD-10-CM | POA: Insufficient documentation

## 2017-10-17 DIAGNOSIS — M1990 Unspecified osteoarthritis, unspecified site: Secondary | ICD-10-CM | POA: Diagnosis not present

## 2017-10-17 DIAGNOSIS — R200 Anesthesia of skin: Secondary | ICD-10-CM

## 2017-10-17 DIAGNOSIS — Z882 Allergy status to sulfonamides status: Secondary | ICD-10-CM | POA: Diagnosis not present

## 2017-10-17 DIAGNOSIS — I10 Essential (primary) hypertension: Secondary | ICD-10-CM | POA: Diagnosis not present

## 2017-10-17 DIAGNOSIS — Z79899 Other long term (current) drug therapy: Secondary | ICD-10-CM | POA: Diagnosis not present

## 2017-10-17 DIAGNOSIS — Z7982 Long term (current) use of aspirin: Secondary | ICD-10-CM | POA: Insufficient documentation

## 2017-10-17 DIAGNOSIS — G459 Transient cerebral ischemic attack, unspecified: Principal | ICD-10-CM | POA: Insufficient documentation

## 2017-10-17 DIAGNOSIS — I340 Nonrheumatic mitral (valve) insufficiency: Secondary | ICD-10-CM

## 2017-10-17 DIAGNOSIS — Z87891 Personal history of nicotine dependence: Secondary | ICD-10-CM | POA: Insufficient documentation

## 2017-10-17 DIAGNOSIS — I6601 Occlusion and stenosis of right middle cerebral artery: Secondary | ICD-10-CM | POA: Insufficient documentation

## 2017-10-17 DIAGNOSIS — I6782 Cerebral ischemia: Secondary | ICD-10-CM | POA: Diagnosis not present

## 2017-10-17 DIAGNOSIS — R202 Paresthesia of skin: Secondary | ICD-10-CM

## 2017-10-17 DIAGNOSIS — I6621 Occlusion and stenosis of right posterior cerebral artery: Secondary | ICD-10-CM | POA: Diagnosis not present

## 2017-10-17 DIAGNOSIS — M6281 Muscle weakness (generalized): Secondary | ICD-10-CM

## 2017-10-17 LAB — COMPREHENSIVE METABOLIC PANEL
ALT: 16 IU/L (ref 0–44)
ALT: 16 U/L (ref 0–44)
AST: 18 IU/L (ref 0–40)
AST: 19 U/L (ref 15–41)
Albumin/Globulin Ratio: 1.8 (ref 1.2–2.2)
Albumin: 4.6 g/dL (ref 3.6–4.8)
Albumin: 4.1 g/dL (ref 3.5–5.0)
Alkaline Phosphatase: 78 IU/L (ref 39–117)
Alkaline Phosphatase: 74 U/L (ref 38–126)
Anion gap: 6 (ref 5–15)
BUN/Creatinine Ratio: 10 (ref 10–24)
BUN: 12 mg/dL (ref 8–27)
BUN: 10 mg/dL (ref 8–23)
Bilirubin Total: 0.6 mg/dL (ref 0.0–1.2)
CO2: 25 mmol/L (ref 20–29)
CO2: 25 mmol/L (ref 22–32)
Calcium: 10.2 mg/dL (ref 8.6–10.2)
Calcium: 9.1 mg/dL (ref 8.9–10.3)
Chloride: 99 mmol/L (ref 96–106)
Chloride: 107 mmol/L (ref 98–111)
Creatinine, Ser: 1.16 mg/dL (ref 0.76–1.27)
Creatinine, Ser: 0.99 mg/dL (ref 0.61–1.24)
GFR calc Af Amer: 74 mL/min/{1.73_m2} (ref >59)
GFR calc Af Amer: >60 mL/min (ref >60)
GFR calc non Af Amer: 64 mL/min/{1.73_m2} (ref >59)
GFR calc non Af Amer: >60 mL/min (ref >60)
Globulin, Total: 2.5 g/dL (ref 1.5–4.5)
Glucose, Bld: 119 mg/dL — ABNORMAL HIGH (ref 70–99)
Glucose: 119 mg/dL — ABNORMAL HIGH (ref 65–99)
Potassium: 4.8 mmol/L (ref 3.5–5.2)
Potassium: 4.5 mmol/L (ref 3.5–5.1)
Sodium: 140 mmol/L (ref 134–144)
Sodium: 138 mmol/L (ref 135–145)
Total Bilirubin: 0.7 mg/dL (ref 0.3–1.2)
Total Protein: 7.1 g/dL (ref 6.0–8.5)
Total Protein: 7.4 g/dL (ref 6.5–8.1)

## 2017-10-17 LAB — CBC WITH DIFFERENTIAL/PLATELET
Basophils Absolute: 0.1 10*3/uL (ref 0.0–0.2)
Basos: 1 %
EOS (ABSOLUTE): 0.2 10*3/uL (ref 0.0–0.4)
Eos: 2 %
Hematocrit: 45.1 % (ref 37.5–51.0)
Hemoglobin: 15.8 g/dL (ref 13.0–17.7)
Immature Grans (Abs): 0 10*3/uL (ref 0.0–0.1)
Immature Granulocytes: 0 %
Lymphocytes Absolute: 2.1 10*3/uL (ref 0.7–3.1)
Lymphs: 27 %
MCH: 31 pg (ref 26.6–33.0)
MCHC: 35 g/dL (ref 31.5–35.7)
MCV: 88 fL (ref 79–97)
Monocytes Absolute: 0.7 10*3/uL (ref 0.1–0.9)
Monocytes: 9 %
Neutrophils Absolute: 4.7 10*3/uL (ref 1.4–7.0)
Neutrophils: 61 %
Platelets: 227 10*3/uL (ref 150–450)
RBC: 5.1 x10E6/uL (ref 4.14–5.80)
RDW: 13.2 % (ref 12.3–15.4)
WBC: 7.7 10*3/uL (ref 3.4–10.8)

## 2017-10-17 LAB — DIFFERENTIAL
Basophils Absolute: 0.1 10*3/uL (ref 0–0.1)
Basophils Relative: 1 %
Eosinophils Absolute: 0.3 10*3/uL (ref 0–0.7)
Eosinophils Relative: 3 %
Lymphocytes Relative: 35 %
Lymphs Abs: 2.8 10*3/uL (ref 1.0–3.6)
Monocytes Absolute: 0.7 10*3/uL (ref 0.2–1.0)
Monocytes Relative: 8 %
Neutro Abs: 4.3 10*3/uL (ref 1.4–6.5)
Neutrophils Relative %: 53 %

## 2017-10-17 LAB — CBC
HCT: 44.3 % (ref 40.0–52.0)
Hemoglobin: 15.7 g/dL (ref 13.0–18.0)
MCH: 31.9 pg (ref 26.0–34.0)
MCHC: 35.3 g/dL (ref 32.0–36.0)
MCV: 90.4 fL (ref 80.0–100.0)
Platelets: 209 10*3/uL (ref 150–440)
RBC: 4.91 MIL/uL (ref 4.40–5.90)
RDW: 13 % (ref 11.5–14.5)
WBC: 8.1 10*3/uL (ref 3.8–10.6)

## 2017-10-17 LAB — TROPONIN I: Troponin I: <0.03 ng/mL (ref <0.03)

## 2017-10-17 LAB — GLUCOSE, CAPILLARY: Glucose-Capillary: 125 mg/dL — ABNORMAL HIGH (ref 70–99)

## 2017-10-17 LAB — APTT: aPTT: 29 seconds (ref 24–36)

## 2017-10-17 LAB — PROTIME-INR
INR: 0.95
Prothrombin Time: 12.6 seconds (ref 11.4–15.2)

## 2017-10-17 MED ORDER — ENOXAPARIN SODIUM 40 MG/0.4ML ~~LOC~~ SOLN
40.0000 mg | SUBCUTANEOUS | Status: DC
  Administered 2017-10-17: 23:00:00 40 mg via SUBCUTANEOUS
  Filled 2017-10-17: qty 0.4

## 2017-10-17 MED ORDER — ASPIRIN 300 MG RE SUPP
300.0000 mg | Freq: Every day | RECTAL | Status: DC
  Administered 2017-10-17: 300 mg via RECTAL

## 2017-10-17 MED ORDER — ASPIRIN EC 325 MG PO TBEC
325.0000 mg | DELAYED_RELEASE_TABLET | Freq: Every day | ORAL | Status: DC
  Administered 2017-10-18: 325 mg via ORAL
  Filled 2017-10-17: qty 1

## 2017-10-17 MED ORDER — OMEGA-3-ACID ETHYL ESTERS 1 G PO CAPS
1.0000 g | ORAL_CAPSULE | Freq: Every day | ORAL | Status: DC
  Administered 2017-10-18: 11:00:00 1 g via ORAL
  Filled 2017-10-17: qty 1

## 2017-10-17 MED ORDER — ASPIRIN 81 MG PO CHEW
324.0000 mg | CHEWABLE_TABLET | Freq: Once | ORAL | Status: AC
  Administered 2017-10-17: 324 mg via ORAL
  Filled 2017-10-17: qty 4

## 2017-10-17 MED ORDER — HYDROCODONE-ACETAMINOPHEN 5-325 MG PO TABS: 1.0000 | ORAL_TABLET | Freq: Four times a day (QID) | ORAL | Status: DC | PRN

## 2017-10-17 MED ORDER — RAMIPRIL 10 MG PO CAPS
10.0000 mg | ORAL_CAPSULE | Freq: Every day | ORAL | Status: DC
  Administered 2017-10-18: 10 mg via ORAL
  Filled 2017-10-17: qty 1

## 2017-10-17 MED ORDER — STROKE: EARLY STAGES OF RECOVERY BOOK
Freq: Once | Status: AC
  Administered 2017-10-17: 19:00:00

## 2017-10-17 MED ORDER — LORATADINE 10 MG PO TABS: 10.0000 mg | ORAL_TABLET | Freq: Every day | ORAL | Status: DC | PRN

## 2017-10-17 MED ORDER — ACETAMINOPHEN 650 MG RE SUPP: 650.0000 mg | RECTAL | Status: DC | PRN

## 2017-10-17 MED ORDER — METOPROLOL TARTRATE 25 MG PO TABS
25.0000 mg | ORAL_TABLET | Freq: Two times a day (BID) | ORAL | Status: DC
  Administered 2017-10-17 – 2017-10-18 (×2): 25 mg via ORAL
  Filled 2017-10-17 (×2): qty 1

## 2017-10-17 MED ORDER — LORAZEPAM 1 MG PO TABS
1.0000 mg | ORAL_TABLET | Freq: Three times a day (TID) | ORAL | Status: DC | PRN
  Administered 2017-10-17: 1 mg via ORAL
  Filled 2017-10-17: qty 1

## 2017-10-17 MED ORDER — ACETAMINOPHEN 160 MG/5ML PO SOLN
650.0000 mg | ORAL | Status: DC | PRN
  Filled 2017-10-17: qty 20.3

## 2017-10-17 MED ORDER — HYDRALAZINE HCL 20 MG/ML IJ SOLN
10.0000 mg | INTRAMUSCULAR | Status: DC | PRN
  Administered 2017-10-17: 10 mg via INTRAVENOUS
  Filled 2017-10-17: qty 1

## 2017-10-17 MED ORDER — BRIMONIDINE TARTRATE 0.15 % OP SOLN
1.0000 [drp] | Freq: Three times a day (TID) | OPHTHALMIC | Status: DC
  Administered 2017-10-18: 1 [drp] via OPHTHALMIC
  Filled 2017-10-17: qty 5

## 2017-10-17 MED ORDER — ACETAMINOPHEN 325 MG PO TABS
650.0000 mg | ORAL_TABLET | ORAL | Status: DC | PRN
  Administered 2017-10-17 – 2017-10-18 (×2): 650 mg via ORAL
  Filled 2017-10-17 (×2): qty 2

## 2017-10-17 MED ORDER — FLUTICASONE PROPIONATE 50 MCG/ACT NA SUSP
2.0000 | Freq: Every day | NASAL | Status: DC | PRN
  Filled 2017-10-17: qty 16

## 2017-10-17 NOTE — Telephone Encounter (Signed)
-----   Message from Margo Common, [place] sent at 10/17/2017  8:03 AM EDT ----- All blood tests normal except blood sugar still slightly up. If numbness still occurring, schedule CT of head and/or refer to neurologist (especially if any muscle weakness).

## 2017-10-17 NOTE — Progress Notes (Signed)
   Advanced care plan. Purpose of the Encounter: CODE STATUS Parties in Attendance: Patient  Patient's Decision Capacity: Good Subjective/Patient's story: Presented to emergency room with tingling and numbness in the left side of the body Objective/Medical story Patient needs work-up for stroke Goals of care determination:  Advance care directives and goals of care discussed Patient wants CPR and Defribillation and use of meds, but no intubation OR ventilatory support. CODE STATUS: limited code as above. Time spent discussing advanced care planning: 16 minutes

## 2017-10-17 NOTE — Telephone Encounter (Signed)
Patient advised. He agrees to proceed with CT scan first then depending on results determine need for neurology referral. CT head Wo Contrast ordered.

## 2017-10-17 NOTE — H&P (Signed)
[facility] NAME: Garrett Green    MR#:  [id number]  DATE OF BIRTH:  1946/01/27  DATE OF ADMISSION:  10/17/2017  PRIMARY CARE PHYSICIAN: Birdie Sons, MD   REQUESTING/REFERRING PHYSICIAN:   CHIEF COMPLAINT:   Chief Complaint  Patient presents with  . Numbness    HISTORY OF PRESENT ILLNESS: Garrett Green  is a 72 y.o. male with a known history of arthritis, hypertension presented to the emergency room with tingling and numbness in the left side of the body for the last 3 days.  Initially the tingling and numbness started in the left side of the face and extended to the left upper extremity and left lower extremity.  No complaints of any slurred speech.  No complaints of any chest pain, shortness of breath.  Was worked up with CT head which showed no acute abnormality.  PAST MEDICAL HISTORY:   Past Medical History:  Diagnosis Date  . Arthritis    pt. reports its related to back only at this point   . History of chicken pox   . Hypertension    stress test- 8-9 yrs. ago, [facility], [facility]    PAST SURGICAL HISTORY:  Past Surgical History:  Procedure Laterality Date  . COLONOSCOPY WITH PROPOFOL N/A 03/16/2016   Procedure: COLONOSCOPY WITH PROPOFOL;  Surgeon: Manya Silvas, MD;  Location: [facility] ENDOSCOPY;  Service: Endoscopy;  Laterality: N/A;  . ESOPHAGEAL DILATION    . LUMBAR LAMINECTOMY/DECOMPRESSION MICRODISCECTOMY Left 07/26/2012   Procedure: LUMBAR LAMINECTOMY/DECOMPRESSION MICRODISCECTOMY 1 LEVEL;  Surgeon: Eustace Moore, MD;  Location: [place] NEURO ORS;  Service: Neurosurgery;  Laterality: Left;  lumbar five sacral one  . SHOULDER SURGERY     LEFT 12+ YEARS   . TONSILLECTOMY      SOCIAL HISTORY:  Social History   Tobacco Use  . Smoking status: Former Smoker    Packs/day: 1.00    Years: 15.00    Pack years: 15.00    Last attempt to quit: 10/10/1982    Years since quitting: 35.0  . Smokeless tobacco: Former Systems developer  .  Tobacco comment: stopped smoking around age 55  Substance Use Topics  . Alcohol use: Yes    Comment: OCC     FAMILY HISTORY:  Family History  Problem Relation Age of Onset  . Heart disease Mother   . Cancer Sister   . Dementia Brother   . Dementia Sister   . Diabetes Brother   . Diabetes Sister     DRUG ALLERGIES:  Allergies  Allergen Reactions  . Sulfa Antibiotics Other (See Comments)    Lethargic and hypotension    REVIEW OF SYSTEMS:   CONSTITUTIONAL: No fever, has fatigue and weakness.  EYES: No blurred or double vision.  EARS, NOSE, AND THROAT: No tinnitus or ear pain.  RESPIRATORY: No cough, shortness of breath, wheezing or hemoptysis.  CARDIOVASCULAR: No chest pain, orthopnea, edema.  GASTROINTESTINAL: No nausea, vomiting, diarrhea or abdominal pain.  GENITOURINARY: No dysuria, hematuria.  ENDOCRINE: No polyuria, nocturia,  HEMATOLOGY: No anemia, easy bruising or bleeding SKIN: No rash or lesion. MUSCULOSKELETAL: No joint pain or arthritis.   NEUROLOGIC: Has tingling and numbness in left side of body PSYCHIATRY: No anxiety or depression.   MEDICATIONS AT HOME:  Prior to Admission medications   Medication Sig Start Date End Date Taking? Authorizing Provider  aspirin 81 MG tablet Take 81 mg by mouth daily.   Yes [provider]  brimonidine (ALPHAGAN P) 0.1 % SOLN Place 1 drop into the left eye 2 (two) times daily.   Yes [provider]  fluticasone (FLONASE) 50 MCG/ACT nasal spray Place 2 sprays into both nostrils daily as needed for allergies. 11/30/16  Yes Birdie Sons, MD  HYDROcodone-acetaminophen (NORCO/VICODIN) 5-325 MG tablet Take 1 tablet by mouth every 6 (six) hours as needed. 10/04/17  Yes Birdie Sons, MD  latanoprost (XALATAN) 0.005 % ophthalmic solution Place 1 drop into both eyes at bedtime.   Yes [provider]  loratadine (CLARITIN) 10 MG tablet Take 10 mg by mouth daily as needed for allergies.   Yes [provider]  LORazepam (ATIVAN) 1 MG tablet Take 1 tablet (1 mg total) by mouth 3 (three) times daily as needed. 11/30/16  Yes Birdie Sons, MD  ramipril (ALTACE) 10 MG capsule TAKE 1 CAPSULE BY MOUTH  DAILY 01/24/17  Yes Birdie Sons, MD      PHYSICAL EXAMINATION:   VITAL SIGNS: Blood pressure (!) 182/97, pulse 74, temperature 97.7 F (36.5 C), temperature source Oral, resp. rate (!) 23, height 5\' 8"  (1.727 m), weight 89.4 kg (197 lb), SpO2 98 %.  GENERAL:  72 y.o.-year-old patient lying in the bed with no acute distress.  EYES: Pupils equal, round, reactive to light and accommodation. No scleral icterus. Extraocular muscles intact.  HEENT: Head atraumatic, normocephalic. Oropharynx and nasopharynx clear.  NECK:  Supple, no jugular venous distention. No thyroid enlargement, no tenderness.  LUNGS: Normal breath sounds bilaterally, no wheezing, rales,rhonchi or crepitation. No use of accessory muscles of respiration.  CARDIOVASCULAR: S1, S2 normal. No murmurs, rubs, or gallops.  ABDOMEN: Soft, nontender, nondistended. Bowel sounds present. No organomegaly or mass.  EXTREMITIES: No pedal edema, cyanosis, or clubbing.  NEUROLOGIC: Cranial nerves II through XII are intact. Muscle strength 5/5 in all extremities. Sensation intact. Gait not checked.  PSYCHIATRIC: The patient is alert and oriented x 3.  SKIN: No obvious rash, lesion, or ulcer.   LABORATORY PANEL:   CBC Recent Labs  Lab 10/16/17 1111 10/17/17 1540  WBC 7.7 8.1  HGB 15.8 15.7  HCT 45.1 44.3  PLT 227 209  MCV 88 90.4  MCH 31.0 31.9  MCHC 35.0 35.3  RDW 13.2 13.0  LYMPHSABS 2.1 2.8  MONOABS  --  0.7  EOSABS 0.2 0.3  BASOSABS 0.1 0.1   ------------------------------------------------------------------------------------------------------------------  Chemistries  Recent Labs  Lab 10/16/17 1111 10/17/17 1540  NA 140 138  K 4.8 4.5  CL 99 107  CO2 25 25  GLUCOSE 119* 119*  BUN 12 10  CREATININE 1.16  0.99  CALCIUM 10.2 9.1  AST 18 19  ALT 16 16  ALKPHOS 78 74  BILITOT 0.6 0.7   ------------------------------------------------------------------------------------------------------------------ estimated creatinine clearance is 76.5 mL/min (by C-G formula based on SCr of 0.99 mg/dL). ------------------------------------------------------------------------------------------------------------------ No results for input(s): TSH, T4TOTAL, T3FREE, THYROIDAB in the last 72 hours.  Invalid input(s): FREET3   Coagulation profile Recent Labs  Lab 10/17/17 1540  INR 0.95   ------------------------------------------------------------------------------------------------------------------- No results for input(s): DDIMER in the last 72 hours. -------------------------------------------------------------------------------------------------------------------  Cardiac Enzymes Recent Labs  Lab 10/17/17 1540  TROPONINI <0.03   ------------------------------------------------------------------------------------------------------------------ Invalid input(s): POCBNP  ---------------------------------------------------------------------------------------------------------------  Urinalysis No results found for: COLORURINE, APPEARANCEUR, LABSPEC, PHURINE, GLUCOSEU, HGBUR, BILIRUBINUR, KETONESUR, PROTEINUR, UROBILINOGEN, NITRITE, LEUKOCYTESUR   RADIOLOGY: Ct Head Wo Contrast  Result Date: 10/17/2017 CLINICAL DATA:  Numbness of the lips for several days, some numbness of the left side of  the face EXAM: CT HEAD WITHOUT CONTRAST TECHNIQUE: Contiguous axial images were obtained from the base of the skull through the vertex without intravenous contrast. COMPARISON:  None. FINDINGS: Brain: The ventricular system is prominent as are the cortical sulci indicative of diffuse atrophy. The septum is midline in position. The fourth ventricle and basilar cisterns are unremarkable. No hemorrhage, mass lesion,  or acute infarction is seen. Vascular: No vascular abnormality is noted on this unenhanced study. Skull: On bone window images, no calvarial abnormality is seen. Sinuses/Orbits: There is complete opacification of the right partition of the sphenoid sinus with some sphenoid wall thickening possibly indicating chronic right sphenoid sinusitis. The remainder of the paranasal sinuses appear well pneumatized. Other: None. IMPRESSION: 1. Diffuse changes of atrophy.  No acute intracranial abnormality. 2. 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 Electronically Signed   By: Ivar Drape M.D.   On: 10/17/2017 16:00    EKG: Orders placed or performed during the hospital encounter of 10/17/17  . ED EKG  . ED EKG  . EKG 12-Lead  . EKG 12-Lead    IMPRESSION AND PLAN:  72 yr old male patient history of arthritis, hypertension presented to the emergency room with tingling and numbness to left side of the body  -Transient ischemic attack Admit patient to medical floor observation bed Work-up for CVA with carotid ultrasound and echocardiogram, MRI and MRA brain Neurology consult  start oral aspirin  - Uncontrolled hypertension controlled blood pressure with oral beta-blocker and ACE inhibitor and PRN hydralazine  -DVT prophylaxis subcu Lovenox daily  All the records are reviewed and case discussed with ED provider. Management plans discussed with the patient, family and they are in agreement.  CODE STATUS:Full code TOTAL TIME TAKING CARE OF THIS PATIENT: 52 minutes.    Saundra Shelling M.D on 10/17/2017 at 6:20 PM  Between 7am to 6pm - Pager - [phone]  After 6pm go to [email or web address] - password [email or web address] [facility]  Office  [phone]  CC: Primary care physician; Birdie Sons, MD

## 2017-10-17 NOTE — Progress Notes (Signed)
Advanced care plan. Purpose of the Encounter: CODE STATUS Parties in Attendance: Patient and family Patient's Decision Capacity: Good Subjective/Patient's story: Presented to emergency room with tingling and numbness in the left side of the body Objective/Medical story Patient needs work-up for stroke Goals of care determination:  Advance care directives and goals of care discussed Patient wants everything done for now which includes cardiac resuscitation, intubation and ventilator if need arises. CODE STATUS: Full code Time spent discussing advanced care planning: 16 minutes

## 2017-10-17 NOTE — ED Provider Notes (Signed)
[facility] Emergency Department Provider Note  ____________________________________________   I have reviewed the triage vital signs and the nursing notes. Where available I have reviewed prior notes and, if possible and indicated, outside hospital notes.    HISTORY  Chief Complaint Numbness    HPI Garrett Green is a 72 y.o. male  History of hypertension, chronic back pain on chronic narcotics, back surgery in the past but no CVA symptoms in the past presents today complaining of intermittent symptoms since Friday, today is Tuesday.  He states since Friday he said 3 episodes off and on a left-sided facial and left arm tingling, coming and going.  Usually lasts for about 15 to 20 minutes today he was out walking however and his left face, left arm and left leg felt numb to him.  That is all resolved except for a slight tingling around his lips.  He states that he does not have a headache, and he denies stiff neck fever chills nausea or vomiting.  Is unclear if he is actually weak during this last episode he states "I might have been a little bit".  No change in vision no difficulty speaking, at his neurologic baseline mentally according to family, patient was triaged and had a CT scan and blood work already performed which are reassuring blood glucose is 125, CT head does not show acute CVA, at this time     Past Medical History:  Diagnosis Date  . Arthritis    pt. reports its related to back only at this point   . History of chicken pox   . Hypertension    stress test- 8-9 yrs. ago, [facility], [facility]    Patient Active Problem List   Diagnosis Date Noted  . Prediabetes 11/30/2016  . Spondylosis 10/22/2014  . Dysphagia 10/22/2014  . Insomnia 10/22/2014  . Fam hx-ischem heart disease 08/13/2007  . 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 Arthralgia of hip or thigh 09/26/2006  . Pain in hip 09/26/2006  . Allergic rhinitis 09/17/2001  . GERD (gastroesophageal reflux  disease) 11/14/1994  . Essential (primary) hypertension 11/14/1994    Past Surgical History:  Procedure Laterality Date  . COLONOSCOPY WITH PROPOFOL N/A 03/16/2016   Procedure: COLONOSCOPY WITH PROPOFOL;  Surgeon: Manya Silvas, MD;  Location[facility] ENDOSCOPY;  Service: Endoscopy;  Laterality: N/A;  . ESOPHAGEAL DILATION    . LUMBAR LAMINECTOMY/DECOMPRESSION MICRODISCECTOMY Left 07/26/2012   Procedure: LUMBAR LAMINECTOMY/DECOMPRESSION MICRODISCECTOMY 1 LEVEL;  Surgeon: Eustace Moore, MD;  Location: [place] NEURO ORS;  Service: Neurosurgery;  Laterality: Left;  lumbar five sacral one  . SHOULDER SURGERY     LEFT 12+ YEARS   . TONSILLECTOMY      Prior to Admission medications   Medication Sig Start Date End Date Taking? Authorizing Provider  ALPHAGAN P 0.1 % SOLN  10/11/17   [provider]  aspirin 81 MG tablet Take 81 mg by mouth daily.    [provider]  brimonidine (ALPHAGAN) 0.15 % ophthalmic solution PLACE 1 DROP INTO LEFT EYE TWICE A DAY 09/29/17   [provider]  fluticasone (FLONASE) 50 MCG/ACT nasal spray Place 2 sprays into both nostrils daily as needed for allergies. 11/30/16   Birdie Sons, MD  HYDROcodone-acetaminophen (NORCO/VICODIN) 5-325 MG tablet Take 1 tablet by mouth every 6 (six) hours as needed. 10/04/17   Birdie Sons, MD  loratadine (CLARITIN) 10 MG tablet Take 10 mg by mouth daily as needed for allergies.    [provider]  LORazepam (ATIVAN) 1 MG tablet Take 1 tablet (1 mg total) by mouth 3 (three) times daily as needed. 11/30/16   Birdie Sons, MD  Omega-3 Fatty Acids (FISH OIL) 1200 MG CAPS Take 1,200 mg by mouth every other day.    [provider]  ramipril (ALTACE) 10 MG capsule TAKE 1 CAPSULE BY MOUTH  DAILY 01/24/17   Birdie Sons, MD    Allergies Sulfa antibiotics  Family History  Problem Relation Age of Onset  . Heart disease Mother   . Cancer Sister   . Dementia Brother   . Dementia Sister   .  Diabetes Brother   . Diabetes Sister     Social History Social History   Tobacco Use  . Smoking status: Former Smoker    Packs/day: 1.00    Years: 15.00    Pack years: 15.00    Last attempt to quit: 10/10/1982    Years since quitting: 35.0  . Smokeless tobacco: Former Systems developer  . Tobacco comment: stopped smoking around age 22  Substance Use Topics  . Alcohol use: Yes    Comment: OCC   . Drug use: No    Review of Systems Constitutional: No fever/chills Eyes: No visual changes. ENT: No sore throat. No stiff neck no neck pain Cardiovascular: Denies chest pain. Respiratory: Denies shortness of breath. Gastrointestinal:   no vomiting.  No diarrhea.  No constipation. Genitourinary: Negative for dysuria. Musculoskeletal: Negative lower extremity swelling Skin: Negative for rash. Neurological: Negative for severe headaches, focal weakness or numbness.   ____________________________________________   PHYSICAL EXAM:  VITAL SIGNS: ED Triage Vitals  Enc Vitals Group     BP 10/17/17 1536 (!) 176/96     Pulse Rate 10/17/17 1536 76     Resp 10/17/17 1536 20     Temp 10/17/17 1536 97.7 F (36.5 C)     Temp Source 10/17/17 1536 Oral     SpO2 10/17/17 1536 97 %     Weight 10/17/17 1537 197 lb (89.4 kg)     Height 10/17/17 1537 5\' 8"  (1.727 m)     Head Circumference --      Peak Flow --      Pain Score 10/17/17 1537 0     Pain Loc --      Pain Edu? --      Excl. in [place]? --     Constitutional: Alert and oriented. Well appearing and in no acute distress. Eyes: Conjunctivae are normal Head: Atraumatic HEENT: No congestion/rhinnorhea. Mucous membranes are moist.  Oropharynx non-erythematous Neck:   Nontender with no meningismus, no masses, no stridor Cardiovascular: Normal rate, regular rhythm. Grossly normal heart sounds.  Good peripheral circulation. Respiratory: Normal respiratory effort.  No retractions. Lungs CTAB. Abdominal: Soft and nontender. No distention. No guarding no  rebound Back:  There is no focal tenderness or step off.  there is no midline tenderness there are no lesions noted. there is no CVA tenderness Musculoskeletal: No lower extremity tenderness, no upper extremity tenderness. No joint effusions, no DVT signs strong distal pulses no edema Neurologic: Cranial nerves II through XII are grossly intact 5 out of 5 strength bilateral upper and lower extremity. Finger to nose within normal limits heel to shin within normal limits, speech is normal with no word finding difficulty or dysarthria, reflexes symmetric, pupils are equally round and reactive to light, there is no pronator drift, sensation is normal except for the left cheek, vision is intact to confrontation,  gait is deferred, there is no nystagmus, normal neurologic exam aside from subjective decreased sensation on the left cheek only. Skin:  Skin is warm, dry and intact. No rash noted. Psychiatric: Mood and affect are normal. Speech and behavior are normal.  ____________________________________________   LABS (all labs ordered are listed, but only abnormal results are displayed)  Labs Reviewed  GLUCOSE, CAPILLARY - Abnormal; Notable for the following components:      Result Value   Glucose-Capillary 125 (*)    All other components within normal limits  COMPREHENSIVE METABOLIC PANEL - Abnormal; Notable for the following components:   Glucose, Bld 119 (*)    All other components within normal limits  PROTIME-INR  APTT  CBC  DIFFERENTIAL  TROPONIN I  CBG MONITORING, ED    Pertinent labs  results that were available during my care of the patient were reviewed by me and considered in my medical decision making (see chart for details). ____________________________________________  EKG  I personally interpreted any EKGs ordered by me or triage Normal sinus rhythm, rate 73 bpm no acute ST elevation or depression normal  axis ____________________________________________  RADIOLOGY  Pertinent labs & imaging results that were available during my care of the patient were reviewed by me and considered in my medical decision making (see chart for details). If possible, patient and/or family made aware of any abnormal findings.  Ct Head Wo Contrast  Result Date: 10/17/2017 CLINICAL DATA:  Numbness of the lips for several days, some numbness of the left side of the face EXAM: CT HEAD WITHOUT CONTRAST TECHNIQUE: Contiguous axial images were obtained from the base of the skull through the vertex without intravenous contrast. COMPARISON:  None. FINDINGS: Brain: The ventricular system is prominent as are the cortical sulci indicative of diffuse atrophy. The septum is midline in position. The fourth ventricle and basilar cisterns are unremarkable. No hemorrhage, mass lesion, or acute infarction is seen. Vascular: No vascular abnormality is noted on this unenhanced study. Skull: On bone window images, no calvarial abnormality is seen. Sinuses/Orbits: There is complete opacification of the right partition of the sphenoid sinus with some sphenoid wall thickening possibly indicating chronic right sphenoid sinusitis. The remainder of the paranasal sinuses appear well pneumatized. Other: None. IMPRESSION: 1. Diffuse changes of atrophy.  No acute intracranial abnormality. 2. 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 Electronically Signed   By: Ivar Drape M.D.   On: 10/17/2017 16:00   ____________________________________________    PROCEDURES  Procedure(s) performed: None  Procedures  Critical Care performed: None  ____________________________________________   INITIAL IMPRESSION / ASSESSMENT AND PLAN / ED COURSE  Pertinent labs & imaging results that were available during my care of the patient were reviewed by me and considered in my medical decision making (see chart for details).  Patient here with intermittent CVA  symptoms since Friday, rapidly improving here today to the point where his only symptom is a slight tingling on the left face.  However all objective findings for CVA are negative aside from the subjective finding.  Patient is in no acute distress he has no headache.  His blood pressure is slightly elevated here but we will not acutely lowered at this time.  He is not a candidate for TPA given symptoms off and on since Friday, and rapidly progressing to normalcy at this time.  I am giving him an aspirin, I have discussed with the hospitalist service they agree with management will admit    ____________________________________________   FINAL CLINICAL IMPRESSION(S) /  ED DIAGNOSES  Final diagnoses:  None      This chart was dictated using voice recognition software.  Despite best efforts to proofread,  errors can occur which can change meaning.      Schuyler Amor, MD 10/17/17 1725

## 2017-10-17 NOTE — ED Triage Notes (Signed)
Pt reports that his lips became numb on Friday. He reports then the left side of his face became numb. He went to his PMD yesterday because of the numbness  In face and arm. They did an EKG on him yesterday and told him that if it got worse to come to the ED. Pt reports today he went for a walk and his whole left side became numb

## 2017-10-17 NOTE — ED Notes (Signed)
Pt given hydralazine for elevated BP - will reassess BP in 10 minutes and then call report

## 2017-10-17 NOTE — ED Notes (Addendum)
Pt reports left sided numbness that started Friday but has gotten increasingly worse today - he was able to undress himself and get into a hospital gown - he is able to stand and ambulate unassisted - he is able to get himself into the bed without assistance Pt states that the numbness only starts when he is up and moving around - he states that when he is sitting or laying he feels normal - he reports the numbness as a heavy feeling - he says the "feeling" starts in his head and moves down his shoulder and arm

## 2017-10-17 NOTE — Telephone Encounter (Signed)
Patient's wife Garrett Green called to report patient's symptoms of numbness and weakness are worse today than yesterday when he was seen in the office. Patient states he went outside for a walk around the house for approximately 15 minutes. He reports having constant left side numbness and weakness since walk. Discussed with Simona Huh. Per Simona Huh he advised patient and wife at [facility] on 10/16/17 if he developed worsening of symptoms to go to the ER for evaluation and CT scan. Patient and wife advised. They verbalized understanding.

## 2017-10-18 ENCOUNTER — Observation Stay: Payer: Medicare Other

## 2017-10-18 DIAGNOSIS — I639 Cerebral infarction, unspecified: Secondary | ICD-10-CM | POA: Diagnosis not present

## 2017-10-18 LAB — LIPID PANEL
Cholesterol: 197 mg/dL (ref 0–200)
HDL: 47 mg/dL (ref >40)
LDL Cholesterol: 104 mg/dL — ABNORMAL HIGH (ref 0–99)
Total CHOL/HDL Ratio: 4.2 RATIO
Triglycerides: 230 mg/dL — ABNORMAL HIGH (ref <150)
VLDL: 46 mg/dL — ABNORMAL HIGH (ref 0–40)

## 2017-10-18 LAB — ECHOCARDIOGRAM COMPLETE
Height: 68 in
Weight: 3152 oz

## 2017-10-18 LAB — HEMOGLOBIN A1C
Hgb A1c MFr Bld: 6.2 % — ABNORMAL HIGH (ref 4.8–5.6)
Mean Plasma Glucose: 131.24 mg/dL

## 2017-10-18 MED ORDER — CLOPIDOGREL BISULFATE 75 MG PO TABS: 75.0000 mg | ORAL_TABLET | Freq: Every day | ORAL | 2 refills | Status: DC

## 2017-10-18 MED ORDER — CLOPIDOGREL BISULFATE 75 MG PO TABS
75.0000 mg | ORAL_TABLET | Freq: Every day | ORAL | Status: DC
  Administered 2017-10-18: 15:00:00 75 mg via ORAL
  Filled 2017-10-18: qty 1

## 2017-10-18 MED ORDER — ATORVASTATIN CALCIUM 40 MG PO TABS: 40.0000 mg | ORAL_TABLET | Freq: Every day | ORAL | 2 refills | Status: DC

## 2017-10-18 MED ORDER — ATORVASTATIN CALCIUM 20 MG PO TABS: 40.0000 mg | ORAL_TABLET | Freq: Every day | ORAL | Status: DC

## 2017-10-18 MED ORDER — LATANOPROST 0.005 % OP SOLN
1.0000 [drp] | Freq: Every day | OPHTHALMIC | Status: DC
  Filled 2017-10-18: qty 2.5

## 2017-10-18 NOTE — Care Management Note (Signed)
Case Management Note  Patient Details  Name: Garrett Green MRN: [id number] Date of Birth: September 12, 1945  Subjective/Objective:   Admitted to [facility] under observation status with the diagnosis of TIA. Lives with wife, Garrett Green, [phone]). Prescriptions are filled at [facility] in [place] and Mail order. No home Health. No skilled facility. No home oxygen. Rolling walker, wheelchair, and cane available in the home, if needed. Takes care of all basic activities of daily living himself, drives. [name] last December in the snow. Good appetite. Wife will transport                 Action/Plan: No needs identified at this time.   Expected Discharge Date:  10/18/17               Expected Discharge Plan:     In-House Referral:     Discharge planning Services     Post Acute Care Choice:    Choice offered to:     DME Arranged:    DME Agency:     HH Arranged:    HH Agency:     Status of Service:     If discussed at [employer], dates discussed:    Additional Comments:  Shelbie Ammons, RN MSN CCM Care Management [phone] 10/18/2017, 9:29 AM

## 2017-10-18 NOTE — Care Management Obs Status (Signed)
[facility] NOTIFICATION   Patient Details  Name: KAMARIUS BUCKBEE MRN: [id number] Date of Birth: [birth year]   Medicare Observation Status Notification Given:  Yes    Shelbie Ammons, RN 10/18/2017, 9:28 AM

## 2017-10-18 NOTE — Consult Note (Signed)
Referring Physician: Bridgett Larsson    Chief Complaint: Left sided numbness  HPI: TADHG ESKEW is an 72 y.o. male with a history of HTN who reports that he was visiting a family member on Friday and became acutely dizzy.  Shortly after noticed numbness on the left side of his face the progressed to involve the left upper and lower extrremities.  Patient symptoms began to worse and patient presented for evaluation.  Initial NIHSS of 1.  Date last known well: Date: 10/13/2017 Time last known well: Time: 14:00 tPA Given: No: Outside time window  Past Medical History:  Diagnosis Date  . Arthritis    pt. reports its related to back only at this point   . History of chicken pox   . Hypertension    stress test- 8-9 yrs. ago, [facility], [facility]    Past Surgical History:  Procedure Laterality Date  . COLONOSCOPY WITH PROPOFOL N/A 03/16/2016   Procedure: COLONOSCOPY WITH PROPOFOL;  Surgeon: Manya Silvas, MD;  Location: [facility] ENDOSCOPY;  Service: Endoscopy;  Laterality: N/A;  . ESOPHAGEAL DILATION    . LUMBAR LAMINECTOMY/DECOMPRESSION MICRODISCECTOMY Left 07/26/2012   Procedure: LUMBAR LAMINECTOMY/DECOMPRESSION MICRODISCECTOMY 1 LEVEL;  Surgeon: Eustace Moore, MD;  Location: [place] NEURO ORS;  Service: Neurosurgery;  Laterality: Left;  lumbar five sacral one  . SHOULDER SURGERY     LEFT 12+ YEARS   . TONSILLECTOMY      Family History  Problem Relation Age of Onset  . Heart disease Mother   . Cancer Sister   . Dementia Brother   . Dementia Sister   . Diabetes Brother   . Diabetes Sister    Social History:  reports that he quit smoking about 35 years ago. He has a 15.00 pack-year smoking history. He has quit using smokeless tobacco. He reports that he drinks alcohol. He reports that he does not use drugs.  Allergies:  Allergies  Allergen Reactions  . Sulfa Antibiotics Other (See Comments)    Lethargic and hypotension    Medications:  I have reviewed the patient's current medications. Prior to  Admission:  Medications Prior to Admission  Medication Sig Dispense Refill Last Dose  . aspirin 81 MG tablet Take 81 mg by mouth daily.   10/17/2017 at 0800  . brimonidine (ALPHAGAN P) 0.1 % SOLN Place 1 drop into the left eye 2 (two) times daily.   10/17/2017 at 0800  . fluticasone (FLONASE) 50 MCG/ACT nasal spray Place 2 sprays into both nostrils daily as needed for allergies. 48 g 1 PRN at PRN  . HYDROcodone-acetaminophen (NORCO/VICODIN) 5-325 MG tablet Take 1 tablet by mouth every 6 (six) hours as needed. 120 tablet 0 10/17/2017 at 0800  . latanoprost (XALATAN) 0.005 % ophthalmic solution Place 1 drop into both eyes at bedtime.   10/16/2017 at 2000  . loratadine (CLARITIN) 10 MG tablet Take 10 mg by mouth daily as needed for allergies.   PRN at PRN  . LORazepam (ATIVAN) 1 MG tablet Take 1 tablet (1 mg total) by mouth 3 (three) times daily as needed. 10 tablet 1 10/16/2017 at 1500  . ramipril (ALTACE) 10 MG capsule TAKE 1 CAPSULE BY MOUTH  DAILY 90 capsule 4 10/17/2017 at 0800   Scheduled: . aspirin  300 mg Rectal Daily   Or  . aspirin EC  325 mg Oral Daily  . atorvastatin  40 mg Oral q1800  . brimonidine  1 drop Both Eyes TID  . clopidogrel  75 mg Oral Daily  .  enoxaparin (LOVENOX) injection  40 mg Subcutaneous Q24H  . latanoprost  1 drop Both Eyes QHS  . metoprolol tartrate  25 mg Oral BID  . omega-3 acid ethyl esters  1 g Oral Daily  . ramipril  10 mg Oral Daily    ROS: History obtained from the patient  General ROS: negative for - chills, fatigue, fever, night sweats, weight gain or weight loss Psychological ROS: negative for - behavioral disorder, hallucinations, memory difficulties, mood swings or suicidal ideation Ophthalmic ROS: negative for - blurry vision, double vision, eye pain or loss of vision ENT ROS: negative for - epistaxis, nasal discharge, oral lesions, sore throat, tinnitus or vertigo Allergy and Immunology ROS: negative for - hives or itchy/watery eyes Hematological and  Lymphatic ROS: negative for - bleeding problems, bruising or swollen lymph nodes Endocrine ROS: negative for - galactorrhea, hair pattern changes, polydipsia/polyuria or temperature intolerance Respiratory ROS: negative for - cough, hemoptysis, shortness of breath or wheezing Cardiovascular ROS: negative for - chest pain, dyspnea on exertion, edema or irregular heartbeat Gastrointestinal ROS: negative for - abdominal pain, diarrhea, hematemesis, nausea/vomiting or stool incontinence Genito-Urinary ROS: negative for - dysuria, hematuria, incontinence or urinary frequency/urgency Musculoskeletal ROS: negative for - joint swelling or muscular weakness Neurological ROS: as noted in HPI Dermatological ROS: negative for rash and skin lesion changes  Physical Examination: Blood pressure (!) 171/93, pulse 70, temperature 98 F (36.7 C), temperature source Oral, resp. rate 18, height 5\' 8"  (1.727 m), weight 89.4 kg (197 lb), SpO2 99 %.  HEENT-  Normocephalic, no lesions, without obvious abnormality.  Normal external eye and conjunctiva.  Normal TM's bilaterally.  Normal auditory canals and external ears. Normal external nose, mucus membranes and septum.  Normal pharynx. Cardiovascular- S1, S2 normal, pulses palpable throughout   Lungs- chest clear, no wheezing, rales, normal symmetric air entry Abdomen- soft, non-tender; bowel sounds normal; no masses,  no organomegaly Extremities- no edema Lymph-no adenopathy palpable Musculoskeletal-no joint tenderness, deformity or swelling Skin-warm and dry, no hyperpigmentation, vitiligo, or suspicious lesions  Neurological Examination   Mental Status: Alert, oriented, thought content appropriate.  Speech fluent without evidence of aphasia.  Able to follow 3 step commands without difficulty. Cranial Nerves: II: Discs flat bilaterally; Visual fields grossly normal, pupils equal, round, reactive to light and accommodation III,IV, VI: ptosis not present,  extra-ocular motions intact bilaterally V,VII: smile symmetric, facial light touch sensation decreased on the left VIII: hearing normal bilaterally IX,X: gag reflex present XI: bilateral shoulder shrug XII: midline tongue extension Motor: Right : Upper extremity   5/5    Left:     Upper extremity   5/5  Lower extremity   5/5     Lower extremity   5/5 Tone and bulk:normal tone throughout; no atrophy noted Sensory: Pinprick and light touch decreased in the left upper and lower extremity Deep Tendon Reflexes: 2+ in the upper extremities and absent in the lower extremities Plantars: Right: downgoing   Left: downgoing Cerebellar: Normal finger-to-nose, normal rapid alternating movements and normal heel-to-shin testing bilaterally Gait: normal gait and station    Laboratory Studies:  Basic Metabolic Panel: Recent Labs  Lab 10/16/17 1111 10/17/17 1540  NA 140 138  K 4.8 4.5  CL 99 107  CO2 25 25  GLUCOSE 119* 119*  BUN 12 10  CREATININE 1.16 0.99  CALCIUM 10.2 9.1    Liver Function Tests: Recent Labs  Lab 10/16/17 1111 10/17/17 1540  AST 18 19  ALT 16 16  ALKPHOS  78 74  BILITOT 0.6 0.7  PROT 7.1 7.4  ALBUMIN 4.6 4.1   No results for input(s): LIPASE, AMYLASE in the last 168 hours. No results for input(s): AMMONIA in the last 168 hours.  CBC: Recent Labs  Lab 10/16/17 1111 10/17/17 1540  WBC 7.7 8.1  NEUTROABS 4.7 4.3  HGB 15.8 15.7  HCT 45.1 44.3  MCV 88 90.4  PLT 227 209    Cardiac Enzymes: Recent Labs  Lab 10/17/17 1540  TROPONINI <0.03    BNP: Invalid input(s): POCBNP  CBG: Recent Labs  Lab 10/17/17 1538  GLUCAP 125*    Microbiology: Results for orders placed or performed during the hospital encounter of 10/09/12  Surgical pcr screen     Status: None   Collection Time: 10/09/12 10:45 AM  Result Value Ref Range Status   MRSA, PCR NEGATIVE NEGATIVE Final   Staphylococcus aureus NEGATIVE NEGATIVE Final    Comment:        The Xpert SA  Assay (FDA approved for NASAL specimens in patients over 39 years of age), is one component of a comprehensive surveillance program.  Test performance has been validated by [employer] for patients greater than or equal to 42 year old. It is not intended to diagnose infection nor to guide or monitor treatment.    Coagulation Studies: Recent Labs    10/17/17 1540  LABPROT 12.6  INR 0.95    Urinalysis: No results for input(s): COLORURINE, LABSPEC, PHURINE, GLUCOSEU, HGBUR, BILIRUBINUR, KETONESUR, PROTEINUR, UROBILINOGEN, NITRITE, LEUKOCYTESUR in the last 168 hours.  Invalid input(s): APPERANCEUR  Lipid Panel:    Component Value Date/Time   CHOL 197 10/18/2017 0555   CHOL 179 11/30/2016 1107   TRIG 230 (H) 10/18/2017 0555   HDL 47 10/18/2017 0555   HDL 47 11/30/2016 1107   CHOLHDL 4.2 10/18/2017 0555   VLDL 46 (H) 10/18/2017 0555   LDLCALC 104 (H) 10/18/2017 0555   LDLCALC 105 (H) 11/30/2016 1107    HgbA1C:  Lab Results  Component Value Date   HGBA1C 6.0 (H) 11/30/2016    Urine Drug Screen:  No results found for: LABOPIA, COCAINSCRNUR, LABBENZ, AMPHETMU, THCU, LABBARB  Alcohol Level: No results for input(s): ETH in the last 168 hours.  Other results: EKG: normal sinus rhythm at 73 bpm.  Imaging: Ct Head Wo Contrast  Result Date: 10/17/2017 CLINICAL DATA:  Numbness of the lips for several days, some numbness of the left side of the face EXAM: CT HEAD WITHOUT CONTRAST TECHNIQUE: Contiguous axial images were obtained from the base of the skull through the vertex without intravenous contrast. COMPARISON:  None. FINDINGS: Brain: The ventricular system is prominent as are the cortical sulci indicative of diffuse atrophy. The septum is midline in position. The fourth ventricle and basilar cisterns are unremarkable. No hemorrhage, mass lesion, or acute infarction is seen. Vascular: No vascular abnormality is noted on this unenhanced study. Skull: On bone window images, no  calvarial abnormality is seen. Sinuses/Orbits: There is complete opacification of the right partition of the sphenoid sinus with some sphenoid wall thickening possibly indicating chronic right sphenoid sinusitis. The remainder of the paranasal sinuses appear well pneumatized. Other: None. IMPRESSION: 1. Diffuse changes of atrophy.  No acute intracranial abnormality. 2. 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 Electronically Signed   By: Ivar Drape M.D.   On: 10/17/2017 16:00   Mr Brain Wo Contrast  Result Date: 10/17/2017 CLINICAL DATA:  72 y/o M; numbness and tingling in the left side of the  body for the last 3 days. EXAM: MRI HEAD WITHOUT CONTRAST MRA HEAD WITHOUT CONTRAST TECHNIQUE: Multiplanar, multiecho pulse sequences of the brain and surrounding structures were obtained without intravenous contrast. Angiographic images of the head were obtained using MRA technique without contrast. COMPARISON:  None. FINDINGS: MRI HEAD FINDINGS Brain: Faint subcentimeter focus of reduced diffusion within the right ventrolateral thalamus (series 100, image 32). No associated hemorrhage or mass effect. Mild chronic microvascular ischemic changes and moderate parenchymal volume loss of the brain. No extra-axial collection, hydrocephalus, or herniation. No focal mass effect of the brain. Vascular: As below. Skull and upper cervical spine: Normal marrow signal. Sinuses/Orbits: Right sphenoid sinus mucous retention cyst or polyp. Other: None. MRA HEAD FINDINGS Internal carotid arteries: Patent. Mild bilateral paraclinoid stenosis. Anterior cerebral arteries:  Patent. Middle cerebral arteries: Patent. Severe distal right M1 stenosis. Mild distal left M1 stenosis. Anterior communicating artery: Patent. Posterior communicating arteries:  Patent. Posterior cerebral arteries: Patent. Severe distal right P2 stenosis at the bifurcation. Mild bilateral P1 stenosis. Basilar artery:  Patent. Vertebral arteries:  Patent. No large vessel  occlusion or aneurysm. IMPRESSION: MRI head: 1. Faint subcentimeter acute/early subacute infarction within the right ventrolateral thalamus. No associated hemorrhage or mass effect. 2. Mild chronic microvascular ischemic changes and moderate parenchymal volume loss of the brain. MRA head: 1. Patent anterior and posterior intracranial circulation. No large vessel occlusion or aneurysm. 2. 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 Multiple segments of mild stenosis. These results will be called to the ordering clinician or representative by the Radiologist Assistant, and communication documented in the PACS or zVision Dashboard. Electronically Signed   By: Kristine Garbe M.D.   On: 10/17/2017 21:45   US Carotid Bilateral (at [facility] And Ap Only)  Result Date: 10/18/2017 CLINICAL DATA:  TIA.  History of hypertension. EXAM: BILATERAL CAROTID DUPLEX ULTRASOUND TECHNIQUE: Pearline Cables scale imaging, color Doppler and duplex ultrasound were performed of bilateral carotid and vertebral arteries in the neck. COMPARISON:  Brain MRI-10/17/2017 FINDINGS: Criteria: Quantification of carotid stenosis is based on velocity parameters that correlate the residual internal carotid diameter with NASCET-based stenosis levels, using the diameter of the distal internal carotid lumen as the denominator for stenosis measurement. The following velocity measurements were obtained: RIGHT ICA:  65/18 cm/sec CCA:  76/28 cm/sec SYSTOLIC ICA/CCA RATIO:  0.9 ECA:  104 cm/sec LEFT ICA:  69/24 cm/sec CCA:  31/51 cm/sec SYSTOLIC ICA/CCA RATIO:  0.8 ECA:  88 cm/sec RIGHT CAROTID ARTERY: There is a minimal amount of echogenic plaque within the right carotid bulb (image 17), not resulting in elevated peak systolic velocities within the interrogated course of the right internal carotid artery to suggest a hemodynamically significant stenosis RIGHT VERTEBRAL ARTERY:  Antegrade flow LEFT CAROTID ARTERY: There is a moderate amount of  echogenic plaque within the left carotid bulb (images 50 and 52), not resulting in elevated peak systolic velocities within the interrogated course of the left internal carotid artery to suggest a hemodynamically significant stenosis. LEFT VERTEBRAL ARTERY:  Antegrade flow IMPRESSION: Minimal to moderate amount of bilateral atherosclerotic plaque, left greater than right, not resulting in a hemodynamically significant stenosis within either internal carotid artery. Electronically Signed   By: Sandi Mariscal M.D.   On: 10/18/2017 10:31   Mr Jodene Nam Head/brain VO Cm  Result Date: 10/17/2017 CLINICAL DATA:  72 y/o M; numbness and tingling in the left side of the body for the last 3 days. EXAM: MRI HEAD WITHOUT CONTRAST MRA HEAD WITHOUT CONTRAST  TECHNIQUE: Multiplanar, multiecho pulse sequences of the brain and surrounding structures were obtained without intravenous contrast. Angiographic images of the head were obtained using MRA technique without contrast. COMPARISON:  None. FINDINGS: MRI HEAD FINDINGS Brain: Faint subcentimeter focus of reduced diffusion within the right ventrolateral thalamus (series 100, image 32). No associated hemorrhage or mass effect. Mild chronic microvascular ischemic changes and moderate parenchymal volume loss of the brain. No extra-axial collection, hydrocephalus, or herniation. No focal mass effect of the brain. Vascular: As below. Skull and upper cervical spine: Normal marrow signal. Sinuses/Orbits: Right sphenoid sinus mucous retention cyst or polyp. Other: None. MRA HEAD FINDINGS Internal carotid arteries: Patent. Mild bilateral paraclinoid stenosis. Anterior cerebral arteries:  Patent. Middle cerebral arteries: Patent. Severe distal right M1 stenosis. Mild distal left M1 stenosis. Anterior communicating artery: Patent. Posterior communicating arteries:  Patent. Posterior cerebral arteries: Patent. Severe distal right P2 stenosis at the bifurcation. Mild bilateral P1 stenosis. Basilar  artery:  Patent. Vertebral arteries:  Patent. No large vessel occlusion or aneurysm. IMPRESSION: MRI head: 1. Faint subcentimeter acute/early subacute infarction within the right ventrolateral thalamus. No associated hemorrhage or mass effect. 2. Mild chronic microvascular ischemic changes and moderate parenchymal volume loss of the brain. MRA head: 1. Patent anterior and posterior intracranial circulation. No large vessel occlusion or aneurysm. 2. 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 Multiple segments of mild stenosis. These results will be called to the ordering clinician or representative by the Radiologist Assistant, and communication documented in the PACS or zVision Dashboard. Electronically Signed   By: Kristine Garbe M.D.   On: 10/17/2017 21:45    Assessment: 72 y.o. male presenting with left sided hemianesthesia.  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  Carotid dopplers show no evidence of hemodynamically significant stenosis.  Echocardiogram pending.  LDL 104.  Stroke Risk Factors - hypertension  Plan: 1. HgbA1c 2. PT consult, OT consult, Speech consult 3. Echocardiogram pending 4. Prophylactic therapy-ASA 81mg  and Plavix 75mg  daily for one month with change to monotherapy with Plavix after that time.   5. Statin for lipid management with target LDL<70. 6. Telemetry monitoring 7. Frequent neuro checks   Alexis Goodell, MD Neurology [phone] 10/18/2017, 1:04 PM

## 2017-10-18 NOTE — Discharge Summary (Addendum)
[facility] NAME: Garrett Green    MR#:  [id number]  DATE OF BIRTH:  Oct 26, 1945  DATE OF ADMISSION:  10/17/2017   ADMITTING PHYSICIAN: Saundra Shelling, MD  DATE OF DISCHARGE: 10/18/2017 PRIMARY CARE PHYSICIAN: Birdie Sons, MD   ADMISSION DIAGNOSIS:  Left sided numbness [R20.0] DISCHARGE DIAGNOSIS:  Active Problems:   TIA (transient ischemic attack)  SECONDARY DIAGNOSIS:   Past Medical History:  Diagnosis Date  . Arthritis    pt. reports its related to back only at this point   . History of chicken pox   . Hypertension    stress test- 8-9 yrs. ago, wnl, [facility]   HOSPITAL COURSE:  72 yr old male patient history of arthritis, hypertension presented to the emergency room with tingling and numbness to left side of the body  Acute CVA. MRI of the brain show CVA. Carotid ultrasound is unremarkable and echocardiogram is pending.  His symptoms has much improved. Per Dr. Doy Mince, continue aspirin, start Plavix and Lipitor.  Essential hypertension Continue home hypertension medication. Discussed with Dr. Doy Mince. DISCHARGE CONDITIONS:  Stable, discharge to home today. CONSULTS OBTAINED:  Treatment Team:  Alexis Goodell, MD DRUG ALLERGIES:   Allergies  Allergen Reactions  . Sulfa Antibiotics Other (See Comments)    Lethargic and hypotension   DISCHARGE MEDICATIONS:   Allergies as of 10/18/2017      Reactions   Sulfa Antibiotics Other (See Comments)   Lethargic and hypotension      Medication List    TAKE these medications   aspirin 81 MG tablet Take 81 mg by mouth daily.   atorvastatin 40 MG tablet Commonly known as:  LIPITOR Take 1 tablet (40 mg total) by mouth daily at 6 PM.   brimonidine 0.1 % Soln Commonly known as:  ALPHAGAN P Place 1 drop into the left eye 2 (two) times daily.   clopidogrel 75 MG tablet Commonly known as:  PLAVIX Take 1 tablet (75 mg total) by mouth daily.   fluticasone 50  MCG/ACT nasal spray Commonly known as:  FLONASE Place 2 sprays into both nostrils daily as needed for allergies.   HYDROcodone-acetaminophen 5-325 MG tablet Commonly known as:  NORCO/VICODIN Take 1 tablet by mouth every 6 (six) hours as needed.   latanoprost 0.005 % ophthalmic solution Commonly known as:  XALATAN Place 1 drop into both eyes at bedtime.   loratadine 10 MG tablet Commonly known as:  CLARITIN Take 10 mg by mouth daily as needed for allergies.   LORazepam 1 MG tablet Commonly known as:  ATIVAN Take 1 tablet (1 mg total) by mouth 3 (three) times daily as needed.   ramipril 10 MG capsule Commonly known as:  ALTACE TAKE 1 CAPSULE BY MOUTH  DAILY        DISCHARGE INSTRUCTIONS:  See AVS, If you experience worsening of your admission symptoms, develop shortness of breath, life threatening emergency, suicidal or homicidal thoughts you must seek medical attention immediately by calling 911 or calling your MD immediately  if symptoms less severe.  You Must read complete instructions/literature along with all the possible adverse reactions/side effects for all the Medicines you take and that have been prescribed to you. Take any new Medicines after you have completely understood and accpet all the possible adverse reactions/side effects.   Please note  You were cared for by a hospitalist during your hospital stay. If you have any questions about your discharge medications or the  care you received while you were in the hospital after you are discharged, you can call the unit and asked to speak with the hospitalist on call if the hospitalist that took care of you is not available. Once you are discharged, your primary care physician will handle any further medical issues. Please note that NO REFILLS for any discharge medications will be authorized once you are discharged, as it is imperative that you return to your primary care physician (or establish a relationship with a  primary care physician if you do not have one) for your aftercare needs so that they can reassess your need for medications and monitor your lab values.    On the day of Discharge:  VITAL SIGNS:  Blood pressure (!) 171/93, pulse 70, temperature 98 F (36.7 C), temperature source Oral, resp. rate 18, height 5\' 8"  (1.727 m), weight 197 lb (89.4 kg), SpO2 99 %. PHYSICAL EXAMINATION:  GENERAL:  72 y.o.-year-old patient lying in the bed with no acute distress.  EYES: Pupils equal, round, reactive to light and accommodation. No scleral icterus. Extraocular muscles intact.  HEENT: Head atraumatic, normocephalic. Oropharynx and nasopharynx clear.  NECK:  Supple, no jugular venous distention. No thyroid enlargement, no tenderness.  LUNGS: Normal breath sounds bilaterally, no wheezing, rales,rhonchi or crepitation. No use of accessory muscles of respiration.  CARDIOVASCULAR: S1, S2 normal. No murmurs, rubs, or gallops.  ABDOMEN: Soft, non-tender, non-distended. Bowel sounds present. No organomegaly or mass.  EXTREMITIES: No pedal edema, cyanosis, or clubbing.  NEUROLOGIC: Cranial nerves II through XII are intact. Muscle strength 5/5 in all extremities. Sensation intact. Gait not checked.  PSYCHIATRIC: The patient is alert and oriented x 3.  SKIN: No obvious rash, lesion, or ulcer.  DATA REVIEW:   CBC Recent Labs  Lab 10/17/17 1540  WBC 8.1  HGB 15.7  HCT 44.3  PLT 209    Chemistries  Recent Labs  Lab 10/17/17 1540  NA 138  K 4.5  CL 107  CO2 25  GLUCOSE 119*  BUN 10  CREATININE 0.99  CALCIUM 9.1  AST 19  ALT 16  ALKPHOS 10  BILITOT 0.7     Microbiology Results  Results for orders placed or performed during the hospital encounter of 10/09/12  Surgical pcr screen     Status: None   Collection Time: 10/09/12 10:45 AM  Result Value Ref Range Status   MRSA, PCR NEGATIVE NEGATIVE Final   Staphylococcus aureus NEGATIVE NEGATIVE Final    Comment:        The Xpert SA Assay  (FDA approved for NASAL specimens in patients over 54 years of age), is one component of a comprehensive surveillance program.  Test performance has been validated by [employer] for patients greater than or equal to 67 year old. It is not intended to diagnose infection nor to guide or monitor treatment.    RADIOLOGY:  Ct Head Wo Contrast  Result Date: 10/17/2017 CLINICAL DATA:  Numbness of the lips for several days, some numbness of the left side of the face EXAM: CT HEAD WITHOUT CONTRAST TECHNIQUE: Contiguous axial images were obtained from the base of the skull through the vertex without intravenous contrast. COMPARISON:  None. FINDINGS: Brain: The ventricular system is prominent as are the cortical sulci indicative of diffuse atrophy. The septum is midline in position. The fourth ventricle and basilar cisterns are unremarkable. No hemorrhage, mass lesion, or acute infarction is seen. Vascular: No vascular abnormality is noted on this unenhanced study. Skull: On  bone window images, no calvarial abnormality is seen. Sinuses/Orbits: There is complete opacification of the right partition of the sphenoid sinus with some sphenoid wall thickening possibly indicating chronic right sphenoid sinusitis. The remainder of the paranasal sinuses appear well pneumatized. Other: None. IMPRESSION: 1. Diffuse changes of atrophy.  No acute intracranial abnormality. 2. 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 Electronically Signed   By: Ivar Drape M.D.   On: 10/17/2017 16:00   Mr Brain Wo Contrast  Result Date: 10/17/2017 CLINICAL DATA:  72 y/o M; numbness and tingling in the left side of the body for the last 3 days. EXAM: MRI HEAD WITHOUT CONTRAST MRA HEAD WITHOUT CONTRAST TECHNIQUE: Multiplanar, multiecho pulse sequences of the brain and surrounding structures were obtained without intravenous contrast. Angiographic images of the head were obtained using MRA technique without contrast. COMPARISON:  None.  FINDINGS: MRI HEAD FINDINGS Brain: Faint subcentimeter focus of reduced diffusion within the right ventrolateral thalamus (series 100, image 32). No associated hemorrhage or mass effect. Mild chronic microvascular ischemic changes and moderate parenchymal volume loss of the brain. No extra-axial collection, hydrocephalus, or herniation. No focal mass effect of the brain. Vascular: As below. Skull and upper cervical spine: Normal marrow signal. Sinuses/Orbits: Right sphenoid sinus mucous retention cyst or polyp. Other: None. MRA HEAD FINDINGS Internal carotid arteries: Patent. Mild bilateral paraclinoid stenosis. Anterior cerebral arteries:  Patent. Middle cerebral arteries: Patent. Severe distal right M1 stenosis. Mild distal left M1 stenosis. Anterior communicating artery: Patent. Posterior communicating arteries:  Patent. Posterior cerebral arteries: Patent. Severe distal right P2 stenosis at the bifurcation. Mild bilateral P1 stenosis. Basilar artery:  Patent. Vertebral arteries:  Patent. No large vessel occlusion or aneurysm. IMPRESSION: MRI head: 1. Faint subcentimeter acute/early subacute infarction within the right ventrolateral thalamus. No associated hemorrhage or mass effect. 2. Mild chronic microvascular ischemic changes and moderate parenchymal volume loss of the brain. MRA head: 1. Patent anterior and posterior intracranial circulation. No large vessel occlusion or aneurysm. 2. 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 Multiple segments of mild stenosis. These results will be called to the ordering clinician or representative by the Radiologist Assistant, and communication documented in the PACS or zVision Dashboard. Electronically Signed   By: Kristine Garbe M.D.   On: 10/17/2017 21:45   US Carotid Bilateral (at [facility] And [facility] Only)  Result Date: 10/18/2017 CLINICAL DATA:  TIA.  History of hypertension. EXAM: BILATERAL CAROTID DUPLEX ULTRASOUND TECHNIQUE: Pearline Cables scale  imaging, color Doppler and duplex ultrasound were performed of bilateral carotid and vertebral arteries in the neck. COMPARISON:  Brain MRI-10/17/2017 FINDINGS: Criteria: Quantification of carotid stenosis is based on velocity parameters that correlate the residual internal carotid diameter with NASCET-based stenosis levels, using the diameter of the distal internal carotid lumen as the denominator for stenosis measurement. The following velocity measurements were obtained: RIGHT ICA:  65/18 cm/sec CCA:  62/69 cm/sec SYSTOLIC ICA/CCA RATIO:  0.9 ECA:  104 cm/sec LEFT ICA:  69/24 cm/sec CCA:  48/54 cm/sec SYSTOLIC ICA/CCA RATIO:  0.8 ECA:  88 cm/sec RIGHT CAROTID ARTERY: There is a minimal amount of echogenic plaque within the right carotid bulb (image 17), not resulting in elevated peak systolic velocities within the interrogated course of the right internal carotid artery to suggest a hemodynamically significant stenosis RIGHT VERTEBRAL ARTERY:  Antegrade flow LEFT CAROTID ARTERY: There is a moderate amount of echogenic plaque within the left carotid bulb (images 50 and 52), not resulting in elevated peak systolic velocities within the interrogated  course of the left internal carotid artery to suggest a hemodynamically significant stenosis. LEFT VERTEBRAL ARTERY:  Antegrade flow IMPRESSION: Minimal to moderate amount of bilateral atherosclerotic plaque, left greater than right, not resulting in a hemodynamically significant stenosis within either internal carotid artery. Electronically Signed   By: Sandi Mariscal M.D.   On: 10/18/2017 10:31   Mr Jodene Nam Head/brain VV Cm  Result Date: 10/17/2017 CLINICAL DATA:  72 y/o M; numbness and tingling in the left side of the body for the last 3 days. EXAM: MRI HEAD WITHOUT CONTRAST MRA HEAD WITHOUT CONTRAST TECHNIQUE: Multiplanar, multiecho pulse sequences of the brain and surrounding structures were obtained without intravenous contrast. Angiographic images of the head were  obtained using MRA technique without contrast. COMPARISON:  None. FINDINGS: MRI HEAD FINDINGS Brain: Faint subcentimeter focus of reduced diffusion within the right ventrolateral thalamus (series 100, image 32). No associated hemorrhage or mass effect. Mild chronic microvascular ischemic changes and moderate parenchymal volume loss of the brain. No extra-axial collection, hydrocephalus, or herniation. No focal mass effect of the brain. Vascular: As below. Skull and upper cervical spine: Normal marrow signal. Sinuses/Orbits: Right sphenoid sinus mucous retention cyst or polyp. Other: None. MRA HEAD FINDINGS Internal carotid arteries: Patent. Mild bilateral paraclinoid stenosis. Anterior cerebral arteries:  Patent. Middle cerebral arteries: Patent. Severe distal right M1 stenosis. Mild distal left M1 stenosis. Anterior communicating artery: Patent. Posterior communicating arteries:  Patent. Posterior cerebral arteries: Patent. Severe distal right P2 stenosis at the bifurcation. Mild bilateral P1 stenosis. Basilar artery:  Patent. Vertebral arteries:  Patent. No large vessel occlusion or aneurysm. IMPRESSION: MRI head: 1. Faint subcentimeter acute/early subacute infarction within the right ventrolateral thalamus. No associated hemorrhage or mass effect. 2. Mild chronic microvascular ischemic changes and moderate parenchymal volume loss of the brain. MRA head: 1. Patent anterior and posterior intracranial circulation. No large vessel occlusion or aneurysm. 2. 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 Multiple segments of mild stenosis. These results will be called to the ordering clinician or representative by the Radiologist Assistant, and communication documented in the PACS or zVision Dashboard. Electronically Signed   By: Kristine Garbe M.D.   On: 10/17/2017 21:45     Management plans discussed with the patient, his wife and they are in agreement.  CODE STATUS: Partial Code    TOTAL TIME TAKING CARE OF THIS PATIENT: 30 minutes.    Demetrios Loll M.D on 10/18/2017 at 1:12 PM  Between 7am to 6pm - Pager - [phone]  After 6pm go to [email or web address] - Proofreader  [facility]  Office  [phone]  CC: Primary care physician; Birdie Sons, MD   Note: This dictation was prepared with Dragon dictation along with smaller phrase technology. Any transcriptional errors that result from this process are unintentional.

## 2017-10-18 NOTE — Evaluation (Signed)
Physical Therapy Evaluation Patient Details Name: Garrett Green MRN: [id number] DOB: 1945/06/18 Today's Date: 10/18/2017   History of Present Illness  pt admitted to hospital on 10/17/17 with complaints of R sided weakness and numbness. Pt diagnosed with TIA and a faint subcentimeter acute infarction to the R ventrolateral thalmus. Pt has a past medical history that includes HTN and arthritis.    Clinical Impression  Pt is a pleasant 72 year old male who was admitted for a TIA and faint subcentimeter acute infarct to the R ventrolateral thalmus. Pt performs bed mobility, transfers, and ambulation independently. Pt active at [facility] without assistive device. Pt amb 200' independently during session with WNL gait pattern and cadence. Pt declines change in symptoms with activity and demonstrates good endurance. Pt demonstrates WNL and equal strength of upper and lower extremities bilaterally. Pt demonstrates WNL neuro screening including sensation and coordination of upper and lower extremities. At this time pt demonstrates no deficits and does not require further PT services. Pt is currently at baseline level of function. PT plans to d/c patient in house. If further PT needs arise please indicate so through a new PT order. This entire session was guided, instructed, and directly supervised by Greggory Stallion, DPT.      Follow Up Recommendations No PT follow up    Equipment Recommendations  None recommended by PT    Recommendations for Other Services       Precautions / Restrictions Precautions Precautions: Fall Restrictions Weight Bearing Restrictions: No      Mobility  Bed Mobility Overal bed mobility: Independent                Transfers Overall transfer level: Independent Equipment used: None             General transfer comment: Pt independent with transferring sit<>stand efficiently with no LOB  Ambulation/Gait Ambulation/Gait assistance: Independent Gait Distance  (Feet): 200 Feet Assistive device: None Gait Pattern/deviations: WFL(Within Functional Limits)     General Gait Details: pt amb 200' with no AD. pt demonstrates no LOB and WNL gait pattern and cadence  Stairs            Wheelchair Mobility    Modified Rankin (Stroke Patients Only)       Balance Overall balance assessment: Independent(independent with narrow BOS. 5 seconds single leg stance)                                           Pertinent Vitals/Pain Pain Assessment: No/denies pain    Home Living Family/patient expects to be discharged to:: Private residence Living Arrangements: Spouse/significant other Available Help at Discharge: Family Type of Home: House Home Access: Stairs to enter Entrance Stairs-Rails: Can reach both Entrance Stairs-Number of Steps: 9 Home Layout: Two level;Able to live on main level with bedroom/bathroom Home Equipment: Gilford Rile - 2 wheels;Cane - single point      Prior Function Level of Independence: Independent         Comments: Pt independent at PLOF, active going for walks and working in yard. One fall on ice last year.     Hand Dominance        Extremity/Trunk Assessment   Upper Extremity Assessment Upper Extremity Assessment: Overall WFL for tasks assessed(equal bilateral; neuro screen WNL equal bilateral)    Lower Extremity Assessment Lower Extremity Assessment: Overall WFL for tasks assessed(equal bilateral; neuro screen  WNL equal bilateral)       Communication   Communication: No difficulties  Cognition Arousal/Alertness: Awake/alert Behavior During Therapy: WFL for tasks assessed/performed Overall Cognitive Status: Within Functional Limits for tasks assessed                                        General Comments      Exercises     Assessment/Plan    PT Assessment Patent does not need any further PT services  PT Problem List         PT Treatment Interventions       PT Goals (Current goals can be found in the Care Plan section)  Acute Rehab PT Goals Patient Stated Goal: To go home and get back to PLOF PT Goal Formulation: With patient Time For Goal Achievement: 11/01/17 Potential to Achieve Goals: Good    Frequency     Barriers to discharge        Co-evaluation               AM-PAC PT "6 Clicks" Daily Activity  Outcome Measure Difficulty turning over in bed (including adjusting bedclothes, sheets and blankets)?: None Difficulty moving from lying on back to sitting on the side of the bed? : None Difficulty sitting down on and standing up from a chair with arms (e.g., wheelchair, bedside commode, etc,.)?: None Help needed moving to and from a bed to chair (including a wheelchair)?: None Help needed walking in hospital room?: None Help needed climbing 3-5 steps with a railing? : None 6 Click Score: 24    End of Session Equipment Utilized During Treatment: Gait belt Activity Tolerance: Patient tolerated treatment well Patient left: in bed;with bed alarm set;with SCD's reapplied;with family/visitor present Nurse Communication: Mobility status;Other (comment)(d/c in house) PT Visit Diagnosis: Other symptoms and signs involving the nervous system (H99.774)    Time: 1423-9532 PT Time Calculation (min) (ACUTE ONLY): 13 min   Charges:         PT G Codes:        Garrett Green, SPT   Garrett Green 10/18/2017, 10:22 AM

## 2017-10-18 NOTE — Progress Notes (Signed)
OT Cancellation Note  Patient Details Name: VIJAY DURFLINGER MRN: [id number] DOB: 27-Jan-1946   Cancelled Treatment:    Reason Eval/Treat Not Completed: OT screened, no needs identified, will sign off(Pt. reports being back to baseline with UE, and ADL functioning. )  Harrel Carina, MS, OTR/L 10/18/2017, 9:25 AM

## 2017-10-18 NOTE — Progress Notes (Signed)
Discussed discharge instructions and medications with patient and his wife. IV removed. All questions addressed. Patient transported home via car by his wife.  Danielle Esma Kilts, RN 

## 2017-10-18 NOTE — Progress Notes (Signed)
SLP Cancellation Note  Patient Details Name: Garrett Green MRN: [id number] DOB: 10/25/1945   Cancelled treatment:       Reason Eval/Treat Not Completed: SLP screened, no needs identified, will sign off(chart reviewed; consulted NSG then met w/ pt/wife). Pt and wife denied any difficulty swallowing and he is currently on a regular diet; tolerates swallowing pills w/ water per NSG. Pt conversed at conversational level w/ PT during a walk around the NSG station w/out deficits noted; pt and Wife denied any speech-language deficits.  No further skilled ST services indicated as pt appears at his baseline. Pt agreed. NSG to reconsult if any change in status.     Katherine Watson, MS, CCC-SLP Watson,Katherine 10/18/2017, 9:09 AM   

## 2017-10-19 ENCOUNTER — Encounter: Payer: Self-pay | Admitting: Family Medicine

## 2017-10-19 ENCOUNTER — Emergency Department (HOSPITAL_COMMUNITY): Payer: Medicare Other

## 2017-10-19 ENCOUNTER — Encounter (HOSPITAL_COMMUNITY): Payer: Self-pay | Admitting: General Practice

## 2017-10-19 ENCOUNTER — Other Ambulatory Visit: Payer: Self-pay

## 2017-10-19 ENCOUNTER — Telehealth: Payer: Self-pay

## 2017-10-19 ENCOUNTER — Observation Stay (HOSPITAL_COMMUNITY)
Admission: EM | Admit: 2017-10-19 | Discharge: 2017-10-20 | Disposition: A | Discharge status: Home / Self Care | Payer: Medicare Other | Attending: Internal Medicine | Admitting: Internal Medicine

## 2017-10-19 DIAGNOSIS — Z87891 Personal history of nicotine dependence: Secondary | ICD-10-CM | POA: Diagnosis not present

## 2017-10-19 DIAGNOSIS — I639 Cerebral infarction, unspecified: Secondary | ICD-10-CM | POA: Diagnosis not present

## 2017-10-19 DIAGNOSIS — Z79899 Other long term (current) drug therapy: Secondary | ICD-10-CM | POA: Diagnosis not present

## 2017-10-19 DIAGNOSIS — I10 Essential (primary) hypertension: Secondary | ICD-10-CM | POA: Diagnosis not present

## 2017-10-19 DIAGNOSIS — I6381 Other cerebral infarction due to occlusion or stenosis of small artery: Secondary | ICD-10-CM

## 2017-10-19 DIAGNOSIS — I517 Cardiomegaly: Secondary | ICD-10-CM | POA: Insufficient documentation

## 2017-10-19 DIAGNOSIS — G459 Transient cerebral ischemic attack, unspecified: Secondary | ICD-10-CM | POA: Diagnosis present

## 2017-10-19 DIAGNOSIS — R200 Anesthesia of skin: Secondary | ICD-10-CM | POA: Diagnosis present

## 2017-10-19 DIAGNOSIS — M47816 Spondylosis without myelopathy or radiculopathy, lumbar region: Secondary | ICD-10-CM

## 2017-10-19 DIAGNOSIS — Z7982 Long term (current) use of aspirin: Secondary | ICD-10-CM | POA: Diagnosis not present

## 2017-10-19 HISTORY — DX: Personal history of other medical treatment: Z92.89

## 2017-10-19 HISTORY — DX: Gastro-esophageal reflux disease without esophagitis: K21.9

## 2017-10-19 HISTORY — DX: Transient cerebral ischemic attack, unspecified: G45.9

## 2017-10-19 HISTORY — DX: Pure hypercholesterolemia, unspecified: E78.00

## 2017-10-19 LAB — COMPREHENSIVE METABOLIC PANEL
ALT: 21 U/L (ref 0–44)
AST: 27 U/L (ref 15–41)
Albumin: 4.2 g/dL (ref 3.5–5.0)
Alkaline Phosphatase: 67 U/L (ref 38–126)
Anion gap: 8 (ref 5–15)
BUN: 16 mg/dL (ref 8–23)
CO2: 25 mmol/L (ref 22–32)
Calcium: 10 mg/dL (ref 8.9–10.3)
Chloride: 104 mmol/L (ref 98–111)
Creatinine, Ser: 1.41 mg/dL — ABNORMAL HIGH (ref 0.61–1.24)
GFR calc Af Amer: 57 mL/min — ABNORMAL LOW (ref >60)
GFR calc non Af Amer: 49 mL/min — ABNORMAL LOW (ref >60)
Glucose, Bld: 123 mg/dL — ABNORMAL HIGH (ref 70–99)
Potassium: 5.4 mmol/L — ABNORMAL HIGH (ref 3.5–5.1)
Sodium: 137 mmol/L (ref 135–145)
Total Bilirubin: 1.3 mg/dL — ABNORMAL HIGH (ref 0.3–1.2)
Total Protein: 7.4 g/dL (ref 6.5–8.1)

## 2017-10-19 LAB — RAPID URINE DRUG SCREEN, HOSP PERFORMED
Amphetamines: NOT DETECTED
Benzodiazepines: NOT DETECTED
Cocaine: NOT DETECTED
Opiates: POSITIVE — AB
Tetrahydrocannabinol: NOT DETECTED

## 2017-10-19 LAB — I-STAT TROPONIN, ED: Troponin i, poc: 0 ng/mL (ref 0.00–0.08)

## 2017-10-19 LAB — I-STAT CHEM 8, ED
BUN: 23 mg/dL (ref 8–23)
Calcium, Ion: 1.15 mmol/L (ref 1.15–1.40)
Chloride: 103 mmol/L (ref 98–111)
Creatinine, Ser: 1.3 mg/dL — ABNORMAL HIGH (ref 0.61–1.24)
Glucose, Bld: 119 mg/dL — ABNORMAL HIGH (ref 70–99)
HCT: 51 % (ref 39.0–52.0)
Hemoglobin: 17.3 g/dL — ABNORMAL HIGH (ref 13.0–17.0)
Potassium: 5.3 mmol/L — ABNORMAL HIGH (ref 3.5–5.1)
Sodium: 136 mmol/L (ref 135–145)
TCO2: 24 mmol/L (ref 22–32)

## 2017-10-19 LAB — CBC
HCT: 50.3 % (ref 39.0–52.0)
Hemoglobin: 16.9 g/dL (ref 13.0–17.0)
MCH: 30.5 pg (ref 26.0–34.0)
MCHC: 33.6 g/dL (ref 30.0–36.0)
MCV: 90.6 fL (ref 78.0–100.0)
Platelets: 233 10*3/uL (ref 150–400)
RBC: 5.55 MIL/uL (ref 4.22–5.81)
RDW: 12 % (ref 11.5–15.5)
WBC: 9.7 10*3/uL (ref 4.0–10.5)

## 2017-10-19 LAB — DIFFERENTIAL
Abs Immature Granulocytes: 0.1 10*3/uL (ref 0.0–0.1)
Basophils Absolute: 0.1 10*3/uL (ref 0.0–0.1)
Basophils Relative: 1 %
Eosinophils Absolute: 0.2 10*3/uL (ref 0.0–0.7)
Eosinophils Relative: 2 %
Immature Granulocytes: 1 %
Lymphocytes Relative: 27 %
Lymphs Abs: 2.6 10*3/uL (ref 0.7–4.0)
Monocytes Absolute: 0.9 10*3/uL (ref 0.1–1.0)
Monocytes Relative: 9 %
Neutro Abs: 5.9 10*3/uL (ref 1.7–7.7)
Neutrophils Relative %: 60 %

## 2017-10-19 LAB — PROTIME-INR
INR: 0.95
Prothrombin Time: 12.6 seconds (ref 11.4–15.2)

## 2017-10-19 LAB — URINALYSIS, ROUTINE W REFLEX MICROSCOPIC
Bilirubin Urine: NEGATIVE
Glucose, UA: 50 mg/dL — AB
Hgb urine dipstick: NEGATIVE
Ketones, ur: NEGATIVE mg/dL
Leukocytes, UA: NEGATIVE
Nitrite: NEGATIVE
Protein, ur: NEGATIVE mg/dL
Specific Gravity, Urine: 1.009 (ref 1.005–1.030)
pH: 5 (ref 5.0–8.0)

## 2017-10-19 LAB — APTT: aPTT: 27 seconds (ref 24–36)

## 2017-10-19 LAB — ETHANOL: Alcohol, Ethyl (B): <10 mg/dL (ref <10)

## 2017-10-19 LAB — HIV ANTIBODY (ROUTINE TESTING W REFLEX): HIV Screen 4th Generation wRfx: NONREACTIVE

## 2017-10-19 MED ORDER — FLUTICASONE PROPIONATE 50 MCG/ACT NA SUSP
2.0000 | Freq: Every day | NASAL | Status: DC | PRN
  Filled 2017-10-19: qty 16

## 2017-10-19 MED ORDER — ACETAMINOPHEN 650 MG RE SUPP: 650.0000 mg | Freq: Four times a day (QID) | RECTAL | Status: DC | PRN

## 2017-10-19 MED ORDER — ONDANSETRON HCL 4 MG PO TABS: 4.0000 mg | ORAL_TABLET | Freq: Four times a day (QID) | ORAL | Status: DC | PRN

## 2017-10-19 MED ORDER — LATANOPROST 0.005 % OP SOLN
1.0000 [drp] | Freq: Every day | OPHTHALMIC | Status: DC
  Administered 2017-10-19: 1 [drp] via OPHTHALMIC
  Filled 2017-10-19: qty 2.5

## 2017-10-19 MED ORDER — LORATADINE 10 MG PO TABS: 10.0000 mg | ORAL_TABLET | Freq: Every day | ORAL | Status: DC | PRN

## 2017-10-19 MED ORDER — SODIUM CHLORIDE 0.9 % IV BOLUS
500.0000 mL | Freq: Once | INTRAVENOUS | Status: AC
  Administered 2017-10-19: 500 mL via INTRAVENOUS

## 2017-10-19 MED ORDER — ONDANSETRON HCL 4 MG/2ML IJ SOLN: 4.0000 mg | Freq: Four times a day (QID) | INTRAMUSCULAR | Status: DC | PRN

## 2017-10-19 MED ORDER — ACETAMINOPHEN 325 MG PO TABS
650.0000 mg | ORAL_TABLET | Freq: Four times a day (QID) | ORAL | Status: DC | PRN
  Administered 2017-10-19: 650 mg via ORAL
  Filled 2017-10-19: qty 2

## 2017-10-19 MED ORDER — SODIUM CHLORIDE 0.9 % IV SOLN
Freq: Once | INTRAVENOUS | Status: AC
  Administered 2017-10-19: 19:00:00 via INTRAVENOUS

## 2017-10-19 MED ORDER — CLOPIDOGREL BISULFATE 75 MG PO TABS
75.0000 mg | ORAL_TABLET | Freq: Every day | ORAL | Status: DC
  Administered 2017-10-20: 75 mg via ORAL
  Filled 2017-10-19: qty 1

## 2017-10-19 MED ORDER — BRIMONIDINE TARTRATE 0.15 % OP SOLN
1.0000 [drp] | Freq: Two times a day (BID) | OPHTHALMIC | Status: DC
  Administered 2017-10-19 – 2017-10-20 (×2): 1 [drp] via OPHTHALMIC
  Filled 2017-10-19: qty 5

## 2017-10-19 MED ORDER — ASPIRIN EC 81 MG PO TBEC
81.0000 mg | DELAYED_RELEASE_TABLET | Freq: Every day | ORAL | Status: DC
  Administered 2017-10-19 – 2017-10-20 (×2): 81 mg via ORAL
  Filled 2017-10-19 (×2): qty 1

## 2017-10-19 MED ORDER — GUAIFENESIN 100 MG/5ML PO SOLN
15.0000 mL | ORAL | Status: DC | PRN
  Administered 2017-10-19: 300 mg via ORAL
  Filled 2017-10-19: qty 10
  Filled 2017-10-19: qty 5

## 2017-10-19 MED ORDER — POLYETHYLENE GLYCOL 3350 17 G PO PACK: 17.0000 g | PACK | Freq: Every day | ORAL | Status: DC | PRN

## 2017-10-19 MED ORDER — ENOXAPARIN SODIUM 40 MG/0.4ML ~~LOC~~ SOLN
40.0000 mg | SUBCUTANEOUS | Status: DC
  Administered 2017-10-19: 40 mg via SUBCUTANEOUS
  Filled 2017-10-19: qty 0.4

## 2017-10-19 MED ORDER — ATORVASTATIN CALCIUM 40 MG PO TABS
40.0000 mg | ORAL_TABLET | Freq: Every day | ORAL | Status: DC
  Administered 2017-10-19: 40 mg via ORAL
  Filled 2017-10-19: qty 1

## 2017-10-19 MED ORDER — IOPAMIDOL (ISOVUE-370) INJECTION 76%
INTRAVENOUS | Status: AC
  Filled 2017-10-19: qty 100

## 2017-10-19 MED ORDER — HYDROCODONE-ACETAMINOPHEN 5-325 MG PO TABS
1.0000 | ORAL_TABLET | Freq: Four times a day (QID) | ORAL | Status: DC | PRN
  Administered 2017-10-20: 1 via ORAL
  Filled 2017-10-19: qty 1

## 2017-10-19 NOTE — ED Notes (Signed)
Report called  

## 2017-10-19 NOTE — ED Notes (Signed)
Pt presents to triage with new L sided numbness this AM starting at 0830. Pt was at baseline this morning. Pt wife reports he was d/c from [facility] with stroke but symptoms had resolved.

## 2017-10-19 NOTE — Progress Notes (Signed)
Patient arrived to the unit alert and oriented X 4. C/o head and pain towards the back of the neck. All questions and concerns addressed. Bed in the lowest position with alarm set. Call light in reach.

## 2017-10-19 NOTE — Consult Note (Addendum)
Requesting Physician: Dr. Colvin Caroli    Chief Complaint: left sided numbness  History obtained from:  Patient   HPI:                                                                                                                                         Garrett Green is an 72 y.o. 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  Due to being out of the window he was not a tPA candidate. He was placed on Asa and Plaviz for 1 month then Plavix alone.   Today he returns to [facility] with the same symptoms which started at 0830 while in the shower. HE notes the symptoms occur also with changes in position . His symptoms had fully resolved prior to this AM.   Date last known well: Date: 10/19/2017 Time last known well: Time: 08:30 tPA Given: No: recent stroke NIHSS 1 Modified Rankin: Rankin Score=0   Past Medical History:  Diagnosis Date  . Arthritis    pt. reports its related to back only at this point   . History of chicken pox   . Hypertension    stress test- 8-9 yrs. ago, wnl, [facility]    Past Surgical History:  Procedure Laterality Date  . COLONOSCOPY WITH PROPOFOL N/A 03/16/2016   Procedure: COLONOSCOPY WITH PROPOFOL;  Surgeon: Manya Silvas, MD;  Location: [facility] ENDOSCOPY;  Service: Endoscopy;  Laterality: N/A;  . ESOPHAGEAL DILATION    . LUMBAR LAMINECTOMY/DECOMPRESSION MICRODISCECTOMY Left 07/26/2012   Procedure: LUMBAR LAMINECTOMY/DECOMPRESSION MICRODISCECTOMY 1 LEVEL;  Surgeon: Eustace Moore, MD;  Location: [facility] [facility]URO ORS;  Service: Neurosurgery;  Laterality: Left;  lumbar five sacral one  . SHOULDER SURGERY     LEFT 12+ YEARS   . TONSILLECTOMY      Family History  Problem Relation Age of Onset  . Heart disease Mother   . Cancer Sister   . Dementia Brother   . Dementia Sister   . Diabetes Brother   . Diabetes Sister         Social  History:  reports that he quit smoking about 35 years ago. He has a 15.00 pack-year smoking history. He has quit using smokeless tobacco. He reports that he drinks alcohol. He reports that he does not use drugs.  Allergies:  Allergies  Allergen Reactions  . Sulfa Antibiotics Other (See Comments)    Lethargic and hypotension    Medications:  No current facility-administered medications for this encounter.    Current Outpatient Medications  Medication Sig Dispense Refill  . aspirin 81 MG tablet Take 81 mg by mouth daily.    Marland Kitchen atorvastatin (LIPITOR) 40 MG tablet Take 1 tablet (40 mg total) by mouth daily at 6 PM. 30 tablet 2  . brimonidine (ALPHAGAN P) 0.1 % SOLN Place 1 drop into the left eye 2 (two) times daily.    . clopidogrel (PLAVIX) 75 MG tablet Take 1 tablet (75 mg total) by mouth daily. 30 tablet 2  . fluticasone (FLONASE) 50 MCG/ACT nasal spray Place 2 sprays into both nostrils daily as needed for allergies. 48 g 1  . HYDROcodone-acetaminophen (NORCO/VICODIN) 5-325 MG tablet Take 1 tablet by mouth every 6 (six) hours as needed. 120 tablet 0  . latanoprost (XALATAN) 0.005 % ophthalmic solution Place 1 drop into both eyes at bedtime.    Marland Kitchen loratadine (CLARITIN) 10 MG tablet Take 10 mg by mouth daily as needed for allergies.    Marland Kitchen LORazepam (ATIVAN) 1 MG tablet Take 1 tablet (1 mg total) by mouth 3 (three) times daily as needed. 10 tablet 1  . ramipril (ALTACE) 10 MG capsule TAKE 1 CAPSULE BY MOUTH  DAILY 90 capsule 4     ROS:                                                                                                                                       History obtained from the patient  General ROS: negative for - chills, fatigue, fever, night sweats, weight gain or weight loss Psychological ROS: negative for - , hallucinations, memory difficulties, mood  swings or  Ophthalmic ROS: negative for - blurry vision, double vision, eye pain or loss of vision ENT ROS: negative for - epistaxis, nasal discharge, oral lesions, sore throat, tinnitus or vertigo Respiratory ROS: negative for - cough,  shortness of breath or wheezing Cardiovascular ROS: negative for - chest pain, dyspnea on exertion,  Gastrointestinal ROS: negative for - abdominal pain, diarrhea,  nausea/vomiting or stool incontinence Genito-Urinary ROS: negative for - dysuria, hematuria, incontinence or urinary frequency/urgency Musculoskeletal ROS: negative for - joint swelling or muscular weakness Neurological ROS: as noted in HPI   General Examination:                                                                                                      Blood pressure (!) 153/100, pulse 91, temperature 97.7 F (36.5 C), temperature source  Oral, resp. rate 20, weight 89.2 kg (196 lb 10.4 oz), SpO2 96 %.  HEENT-  Normocephalic, no lesions, without obvious abnormality.  Normal external eye and conjunctiva.   Cardiovascular- S1-S2 audible, pulses palpable throughout   Lungs-no rhonchi or wheezing noted, no excessive working breathing.  Saturations within normal limits Abdomen- All 4 quadrants palpated and nontender Extremities- Warm, dry and intact Musculoskeletal-no joint tenderness, deformity or swelling Skin-warm and dry, no hyperpigmentation, vitiligo, or suspicious lesions  Neurological Examination Mental Status: Alert, oriented, thought content appropriate.  Speech fluent without evidence of aphasia.  Able to follow 3 step commands without difficulty. Cranial Nerves: II: Discs flat bilaterally; Visual fields grossly normal,  III,IV, VI: ptosis not present, extra-ocular motions intact bilaterally, pupils equal, round, reactive to light and accommodation V,VII: smile symmetric, facial light touch sensation normal bilaterally VIII: hearing normal bilaterally IX,X: uvula rises  symmetrically XI: bilateral shoulder shrug XII: midline tongue extension Motor: Right : Upper extremity   5/5    Left:     Upper extremity   5/5  Lower extremity   5/5     Lower extremity   5/5 Tone and bulk:normal tone throughout; no atrophy noted Sensory: Pinprick and light touch decreased on left face, arm and leg Deep Tendon Reflexes: 2+ and symmetric throughout Plantars: Right: downgoing   Left: downgoing Cerebellar: normal finger-to-nose and normal heel-to-shin test Gait: not tested   Lab Results: Basic Metabolic Panel: Recent Labs  Lab 10/16/17 1111 10/17/17 1540 10/19/17 1148 10/19/17 1157  NA 140 138 137 136  K 4.8 4.5 5.4* 5.3*  CL 99 107 104 103  CO2 25 25 25   --   GLUCOSE 119* 119* 123* 119*  BUN 12 10 16 23   CREATININE 1.16 0.99 1.41* 1.30*  CALCIUM 10.2 9.1 10.0  --     CBC: Recent Labs  Lab 10/16/17 1111 10/17/17 1540 10/19/17 1148 10/19/17 1157  WBC 7.7 8.1 9.7  --   NEUTROABS 4.7 4.3 5.9  --   HGB 15.8 15.7 16.9 17.3*  HCT 45.1 44.3 50.3 51.0  MCV 88 90.4 90.6  --   PLT 227 209 233  --     Lipid Panel: Recent Labs  Lab 10/18/17 0555  CHOL 197  TRIG 230*  HDL 47  CHOLHDL 4.2  VLDL 46*  LDLCALC 104*    CBG: Recent Labs  Lab 10/17/17 1538  GLUCAP 125*    Imaging: Ct Head Wo Contrast  Result Date: 10/17/2017 CLINICAL DATA:  Numbness of the lips for several days, some numbness of the left side of the face EXAM: CT HEAD WITHOUT CONTRAST TECHNIQUE: Contiguous axial images were obtained from the base of the skull through the vertex without intravenous contrast. COMPARISON:  None. FINDINGS: Brain: The ventricular system is prominent as are the cortical sulci indicative of diffuse atrophy. The septum is midline in position. The fourth ventricle and basilar cisterns are unremarkable. No hemorrhage, mass lesion, or acute infarction is seen. Vascular: No vascular abnormality is noted on this unenhanced study. Skull: On bone window images, no  calvarial abnormality is seen. Sinuses/Orbits: There is complete opacification of the right partition of the sphenoid sinus with some sphenoid wall thickening possibly indicating chronic right sphenoid sinusitis. The remainder of the paranasal sinuses appear well pneumatized. Other: None. IMPRESSION: 1. Diffuse changes of atrophy.  No acute intracranial abnormality. 2. 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 Electronically Signed   By: Ivar Drape M.D.   On: 10/17/2017 16:00   Mr Brain  Wo Contrast  Result Date: 10/17/2017 CLINICAL DATA:  72 y/o M; numbness and tingling in the left side of the body for the last 3 days. EXAM: MRI HEAD WITHOUT CONTRAST MRA HEAD WITHOUT CONTRAST TECHNIQUE: Multiplanar, multiecho pulse sequences of the brain and surrounding structures were obtained without intravenous contrast. Angiographic images of the head were obtained using MRA technique without contrast. COMPARISON:  None. FINDINGS: MRI HEAD FINDINGS Brain: Faint subcentimeter focus of reduced diffusion within the right ventrolateral thalamus (series 100, image 32). No associated hemorrhage or mass effect. Mild chronic microvascular ischemic changes and moderate parenchymal volume loss of the brain. No extra-axial collection, hydrocephalus, or herniation. No focal mass effect of the brain. Vascular: As below. Skull and upper cervical spine: Normal marrow signal. Sinuses/Orbits: Right sphenoid sinus mucous retention cyst or polyp. Other: None. MRA HEAD FINDINGS Internal carotid arteries: Patent. Mild bilateral paraclinoid stenosis. Anterior cerebral arteries:  Patent. Middle cerebral arteries: Patent. Severe distal right M1 stenosis. Mild distal left M1 stenosis. Anterior communicating artery: Patent. Posterior communicating arteries:  Patent. Posterior cerebral arteries: Patent. Severe distal right P2 stenosis at the bifurcation. Mild bilateral P1 stenosis. Basilar artery:  Patent. Vertebral arteries:  Patent. No large vessel  occlusion or aneurysm. IMPRESSION: MRI head: 1. Faint subcentimeter acute/early subacute infarction within the right ventrolateral thalamus. No associated hemorrhage or mass effect. 2. Mild chronic microvascular ischemic changes and moderate parenchymal volume loss of the brain. MRA head: 1. Patent anterior and posterior intracranial circulation. No large vessel occlusion or aneurysm. 2. 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 Multiple segments of mild stenosis. These results will be called to the ordering clinician or representative by the Radiologist Assistant, and communication documented in the PACS or zVision Dashboard. Electronically Signed   By: Kristine Garbe M.D.   On: 10/17/2017 21:45   US Carotid Bilateral (at [facility] And Ap Only)  Result Date: 10/18/2017 CLINICAL DATA:  TIA.  History of hypertension. EXAM: BILATERAL CAROTID DUPLEX ULTRASOUND TECHNIQUE: Pearline Cables scale imaging, color Doppler and duplex ultrasound were performed of bilateral carotid and vertebral arteries in the neck. COMPARISON:  Brain MRI-10/17/2017 FINDINGS: Criteria: Quantification of carotid stenosis is based on velocity parameters that correlate the residual internal carotid diameter with NASCET-based stenosis levels, using the diameter of the distal internal carotid lumen as the denominator for stenosis measurement. The following velocity measurements were obtained: RIGHT ICA:  65/18 cm/sec CCA:  40/97 cm/sec SYSTOLIC ICA/CCA RATIO:  0.9 ECA:  104 cm/sec LEFT ICA:  69/24 cm/sec CCA:  35/32 cm/sec SYSTOLIC ICA/CCA RATIO:  0.8 ECA:  88 cm/sec RIGHT CAROTID ARTERY: There is a minimal amount of echogenic plaque within the right carotid bulb (image 17), not resulting in elevated peak systolic velocities within the interrogated course of the right internal carotid artery to suggest a hemodynamically significant stenosis RIGHT VERTEBRAL ARTERY:  Antegrade flow LEFT CAROTID ARTERY: There is a moderate amount of  echogenic plaque within the left carotid bulb (images 50 and 52), not resulting in elevated peak systolic velocities within the interrogated course of the left internal carotid artery to suggest a hemodynamically significant stenosis. LEFT VERTEBRAL ARTERY:  Antegrade flow IMPRESSION: Minimal to moderate amount of bilateral atherosclerotic plaque, left greater than right, not resulting in a hemodynamically significant stenosis within either internal carotid artery. Electronically Signed   By: Sandi Mariscal M.D.   On: 10/18/2017 10:31   Mr Jodene Nam Head/brain DJ Cm  Result Date: 10/17/2017 CLINICAL DATA:  72 y/o M; numbness and tingling  in the left side of the body for the last 3 days. EXAM: MRI HEAD WITHOUT CONTRAST MRA HEAD WITHOUT CONTRAST TECHNIQUE: Multiplanar, multiecho pulse sequences of the brain and surrounding structures were obtained without intravenous contrast. Angiographic images of the head were obtained using MRA technique without contrast. COMPARISON:  None. FINDINGS: MRI HEAD FINDINGS Brain: Faint subcentimeter focus of reduced diffusion within the right ventrolateral thalamus (series 100, image 32). No associated hemorrhage or mass effect. Mild chronic microvascular ischemic changes and moderate parenchymal volume loss of the brain. No extra-axial collection, hydrocephalus, or herniation. No focal mass effect of the brain. Vascular: As below. Skull and upper cervical spine: Normal marrow signal. Sinuses/Orbits: Right sphenoid sinus mucous retention cyst or polyp. Other: None. MRA HEAD FINDINGS Internal carotid arteries: Patent. Mild bilateral paraclinoid stenosis. Anterior cerebral arteries:  Patent. Middle cerebral arteries: Patent. Severe distal right M1 stenosis. Mild distal left M1 stenosis. Anterior communicating artery: Patent. Posterior communicating arteries:  Patent. Posterior cerebral arteries: Patent. Severe distal right P2 stenosis at the bifurcation. Mild bilateral P1 stenosis. Basilar  artery:  Patent. Vertebral arteries:  Patent. No large vessel occlusion or aneurysm. IMPRESSION: MRI head: 1. Faint subcentimeter acute/early subacute infarction within the right ventrolateral thalamus. No associated hemorrhage or mass effect. 2. Mild chronic microvascular ischemic changes and moderate parenchymal volume loss of the brain. MRA head: 1. Patent anterior and posterior intracranial circulation. No large vessel occlusion or aneurysm. 2. 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 Multiple segments of mild stenosis. These results will be called to the ordering clinician or representative by the Radiologist Assistant, and communication documented in the PACS or zVision Dashboard. Electronically Signed   By: Kristine Garbe M.D.   On: 10/17/2017 21:45   Ct Head Code Stroke Wo Contrast  Result Date: 10/19/2017 CLINICAL DATA:  Code stroke. 72 year old male with left side weakness. Last seen normal 0830 hours. EXAM: CT HEAD WITHOUT CONTRAST TECHNIQUE: Contiguous axial images were obtained from the base of the skull through the vertex without intravenous contrast. COMPARISON:  Head CT and brain MRI [facility] 10/17/2017 FINDINGS: Brain: Stable gray-white matter differentiation throughout the brain. The subtle diffusion abnormality suspected in the right thalamus two days ago is correlated with mild if any interval hypodensity (series 3, image 16). No midline shift, ventriculomegaly, mass effect, evidence of mass lesion, intracranial hemorrhage or evidence of cortically based acute infarction. Vascular: Calcified atherosclerosis at the skull base. Skull: No acute osseous abnormality identified. Sinuses/Orbits: Unchanged right sphenoid sinus opacification with mucoperiosteal thickening. The remaining visualized paranasal sinuses and mastoids are clear. Other: Stable orbit and scalp soft tissues. ASPECTS [facility] Stroke Program Early CT Score) - Ganglionic level  infarction (caudate, lentiform nuclei, internal capsule, insula, M1-M3 cortex): 7 - Supraganglionic infarction (M4-M6 cortex): 3 Total score (0-10 with 10 being normal): 10 IMPRESSION: 1. Mild if any hypodensity has developed at the right thalamus correspond to the subtle diffusion abnormality suspected by MRI two days ago. 2. No acute cortically based infarct or other intracranial abnormality identified. 3. ASPECTS is 10. 4. These results were communicated to Dr. Leonel Ramsay at 12:09 pmon 7/11/2019by text page via the [facility] messaging system. Electronically Signed   By: Genevie Ann M.D.   On: 10/19/2017 12:09    Assessment and plan discussed with with attending physician and they are in agreement.    Jaxtyn Linville PA-C [facility] [phone]  10/19/2017, 12:31 PM   Assessment: 72 y.o. male with recurrent left sided numbness.  I suspect that this is fluctuating mild vessel disease.  I doubt his M1 stenoses are related to his current symptoms.  In fact, I am not certain that the M1 stenoses are as bad as the MRI makes them appear, and would favor getting a CTA once able.  Stroke Risk Factors - hypertension  Recommend --continue ASA and plavix --CTA head and neck if his creatinine improves overnight. --Hold antihypertensives --Stroke team to follow  Roland Rack, MD [facility] [phone]  If 7pm- 7am, please page neurology on call as listed in [place].

## 2017-10-19 NOTE — Telephone Encounter (Signed)
TOC #1. Called pt to f/u after d/c from [facility] on 10/18/17. Also wanted to confirm his hosp f/u appt w/ Dr. Caryn Section on 10/27/17 @ 11:00am. Discharge planning includes the following:  - Begin Lipitor and Plavix - Continue ASA - f/u with Dr. Caryn Section as scheduled LVM requesting returned call.

## 2017-10-19 NOTE — Code Documentation (Signed)
72yo male presenting to [facility] via private vehicle at 1142. Patient with recent right ventrolateral thalamus on 10/17/2017 with left sided numbness. Patient was discharged yesterday with resolved symptoms. Patient woke up this morning doing well and then at 0830 developed reoccurrence of left sided numbness.  Patient denies weakness but reports some dizziness. Wife reports patient has been having reoccurrences of numbness upon standing and with activity. Taking plavix and aspirin. Code stroke called in the ED. CT completed. Stroke team to the bedside. NIHSS 1, see documentation for details and code stroke times. Patient is contraindicated for tPA d/t recent stroke. CTA ordered. No acute stroke treatment at this time. Bedside handoff with ED RN [place].

## 2017-10-19 NOTE — H&P (Signed)
Date: 10/19/2017               Patient Name:  Garrett Green MRN: [id number]  DOB: [birth year] Age / Sex: 72 y.o., male   PCP: Birdie Sons, MD         Medical Service: Internal Medicine Teaching Service         Attending Physician: Dr. Oval Linsey, MD    First Contact: Dr. Sharon Seller Pager: [phone]  Second Contact: Dr. Reesa Chew Pager: [phone]       After Hours (After 5p/  First Contact Pager: [phone]  weekends / holidays): Second Contact Pager: [phone]   Chief Complaint: Left sided numbness  History of Present Illness: Garrett Green is a 72yo male with history of right thalamic stroke 2 days ago, HTN, and left lid droop s/p complications of eye surgery several years ago presenting today for left sided numbness in his face, arm and legs that began this morning while at home. Symptoms had partially resolved by the time of admission. These are the same symptoms he experienced with his right thalamic stroke, 10/18/17, which were also mostly transient. He states the left side isn't numb but now just feels weird. He denies chest pain, SOB, weakness, and dizziness. He has a headache but believes it is due to being hungry. He states he has been taking his plavix and asa and lipitor the last two days as directed.   Meds:  Current Meds  Medication Sig  . aspirin 81 MG tablet Take 81 mg by mouth daily.  Marland Kitchen atorvastatin (LIPITOR) 40 MG tablet Take 1 tablet (40 mg total) by mouth daily at 6 PM.  . Biotin 10000 MCG TABS Take 1 tablet by mouth daily.  . brimonidine (ALPHAGAN P) 0.1 % SOLN Place 1 drop into the left eye 2 (two) times daily.  . clopidogrel (PLAVIX) 75 MG tablet Take 1 tablet (75 mg total) by mouth daily.  . fluticasone (FLONASE) 50 MCG/ACT nasal spray Place 2 sprays into both nostrils daily as needed for allergies.  Marland Kitchen HYDROcodone-acetaminophen (NORCO/VICODIN) 5-325 MG tablet Take 1 tablet by mouth every 6 (six) hours as needed.  . latanoprost (XALATAN) 0.005 % ophthalmic  solution Place 1 drop into both eyes at bedtime.  Marland Kitchen loratadine (CLARITIN) 10 MG tablet Take 10 mg by mouth daily as needed for allergies.  Marland Kitchen LORazepam (ATIVAN) 1 MG tablet Take 1 tablet (1 mg total) by mouth 3 (three) times daily as needed.  . ramipril (ALTACE) 10 MG capsule TAKE 1 CAPSULE BY MOUTH  DAILY     Allergies: Allergies as of 10/19/2017 - Review Complete 10/19/2017  Allergen Reaction Noted  . Sulfa antibiotics Other (See Comments) 10/17/2017   Past Medical History:  Diagnosis Date  . Arthritis    pt. reports its related to back only at this point   . History of chicken pox   . Hypertension    stress test- 8-9 yrs. ago, wnl, [facility]    Family History:  Mother - Heart Disease Brother - Diabetes, Dementia Sister - Diabetes, Dementia  Social History:  Quit smoking 35 years ago, does not use smokeless tobacco Social drinker Denies drug use   Review of Systems: A complete ROS was negative except as per HPI.   Physical Exam: Blood pressure (!) 163/95, pulse 79, temperature 97.7 F (36.5 C), temperature source Oral, resp. rate (!) 23, weight 196 lb 10.4 oz (89.2 kg), SpO2 98 %.  Physical Exam   Constitution: lying supine  in bed, NAD, pleasant, obese HEENT: normocephalic, atraumatic, left lid droop from previous surgery Cardio: RRR, no murmurs, no edema, +radial & pedal pulses Respiratory: clear to auscultation bilaterally, effort normal Abdominal: non-distended, NTTP, +BS MSK: strength 5/5 UE bilatearlly and LE bilaterally, sensation assymetrical, decreased along left side of face, entire left arm and leg.  Neuro: CN II-XII, negative cerebellar test Skin: clean, dry, and intact   EKG: personally reviewed my interpretation is normal sinus rhythm.    Assessment & Plan by Problem: Active Problems:   * No active hospital problems. *   72yo male with history of right thalamic stroke 2 days ago, HTN, and left lid droop s/p complications of eye surgery several years  ago presenting today for left sided numbness in his face, arm and legs. Symptoms from his initial stroke 10/17/17 mostly resolved when he left the hospital yesterday as well. Previous MRI 7/09 demonstrated subacute infarct in right ventrolateral thalamus.  TIA: Symptoms have mostly resolved. CT today demonstrated slight thalamic hypodensity consistent with stroke on 7/9 with no acute changes.   - neurology stroke team following - plan for CTA once AKI resolves - continue asa, plavix, lipitor 40mg  - hold antihypertensives   AKI: Creatinine 1.3, baseline unknown  - NS 125/hr - am BMP    Dispo: Admit patient to Observation with expected length of stay less than 2 midnights.  SignedMarty Heck, DO 10/19/2017, 2:38 PM  Pager: [phone]

## 2017-10-19 NOTE — ED Provider Notes (Signed)
[place] EMERGENCY DEPARTMENT Provider Note   CSN: [id number] Arrival date & time: 10/19/17  1142     History   Chief Complaint Chief Complaint  Patient presents with  . Code Stroke    HPI JUNIOR HUEZO is a 72 y.o. male.  HPI Patient was diagnosed with stroke 2 days ago.  He was placed on Plavix and aspirin.  Symptoms recurred this morning at 830.  He noted numbness on the side of his face and then on his left arm and leg.  He reports his whole side felt very numb.  He reports he did not have weakness or confusion. Past Medical History:  Diagnosis Date  . Arthritis    pt. reports its related to back only at this point   . History of chicken pox   . Hypertension    stress test- 8-9 yrs. ago, [facility], [facility]    Patient Active Problem List   Diagnosis Date Noted  . Mild concentric left ventricular hypertrophy (LVH) 10/19/2017  . TIA (transient ischemic attack) 10/17/2017  . Prediabetes 11/30/2016  . Spondylosis 10/22/2014  . Dysphagia 10/22/2014  . Insomnia 10/22/2014  . Fam hx-ischem heart disease 08/13/2007  . 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 Arthralgia of hip or thigh 09/26/2006  . Pain in hip 09/26/2006  . Allergic rhinitis 09/17/2001  . GERD (gastroesophageal reflux disease) 11/14/1994  . Essential (primary) hypertension 11/14/1994    Past Surgical History:  Procedure Laterality Date  . COLONOSCOPY WITH PROPOFOL N/A 03/16/2016   Procedure: COLONOSCOPY WITH PROPOFOL;  Surgeon: Manya Silvas, MD;  Location: [facility] ENDOSCOPY;  Service: Endoscopy;  Laterality: N/A;  . ESOPHAGEAL DILATION    . LUMBAR LAMINECTOMY/DECOMPRESSION MICRODISCECTOMY Left 07/26/2012   Procedure: LUMBAR LAMINECTOMY/DECOMPRESSION MICRODISCECTOMY 1 LEVEL;  Surgeon: Eustace Moore, MD;  Location: [facility]URO ORS;  Service: Neurosurgery;  Laterality: Left;  lumbar five sacral one  . SHOULDER SURGERY     LEFT 12+ YEARS   . TONSILLECTOMY          Home Medications    Prior to  Admission medications   Medication Sig Start Date End Date Taking? Authorizing Provider  aspirin 81 MG tablet Take 81 mg by mouth daily.   Yes [provider]  atorvastatin (LIPITOR) 40 MG tablet Take 1 tablet (40 mg total) by mouth daily at 6 PM. 10/18/17  Yes Demetrios Loll, MD  Biotin 10000 MCG TABS Take 1 tablet by mouth daily.   Yes [provider]  brimonidine (ALPHAGAN P) 0.1 % SOLN Place 1 drop into the left eye 2 (two) times daily.   Yes [provider]  clopidogrel (PLAVIX) 75 MG tablet Take 1 tablet (75 mg total) by mouth daily. 10/18/17  Yes Demetrios Loll, MD  fluticasone [facility]) 50 MCG/ACT nasal spray Place 2 sprays into both nostrils daily as needed for allergies. 11/30/16  Yes Birdie Sons, MD  HYDROcodone-acetaminophen (NORCO/VICODIN) 5-325 MG tablet Take 1 tablet by mouth every 6 (six) hours as needed. 10/04/17  Yes Birdie Sons, MD  latanoprost (XALATAN) 0.005 % ophthalmic solution Place 1 drop into both eyes at bedtime.   Yes [provider]  loratadine (CLARITIN) 10 MG tablet Take 10 mg by mouth daily as needed for allergies.   Yes [provider]  LORazepam (ATIVAN) 1 MG tablet Take 1 tablet (1 mg total) by mouth 3 (three) times daily as needed. 11/30/16  Yes Birdie Sons, MD  ramipril (ALTACE) 10 MG capsule TAKE  1 CAPSULE BY MOUTH  DAILY 01/24/17  Yes Birdie Sons, MD    Family History Family History  Problem Relation Age of Onset  . Heart disease Mother   . Cancer Sister   . Dementia Brother   . Dementia Sister   . Diabetes Brother   . Diabetes Sister     Social History Social History   Tobacco Use  . Smoking status: Former Smoker    Packs/day: 1.00    Years: 15.00    Pack years: 15.00    Last attempt to quit: 10/10/1982    Years since quitting: 35.0  . Smokeless tobacco: Former Systems developer  . Tobacco comment: stopped smoking around age 8  Substance Use Topics  . Alcohol use: Yes    Comment: OCC   . Drug use:  No     Allergies   Sulfa antibiotics   Review of Systems Review of Systems 10 Systems reviewed and are negative for acute change except as noted in the HPI.  Physical Exam Updated Vital Signs BP (!) 163/95   Pulse 79   Temp 97.7 F (36.5 C) (Oral)   Resp (!) 23   Wt 89.2 kg (196 lb 10.4 oz)   SpO2 98%   BMI 29.90 kg/m   Physical Exam  Constitutional: He is oriented to person, place, and time. He appears well-developed and well-nourished.  HENT:  Head: Normocephalic and atraumatic.  Mouth/Throat: Oropharynx is clear and moist.  Eyes: Pupils are equal, round, and reactive to light. EOM are normal.  Neck: Neck supple.  Cardiovascular: Normal rate, regular rhythm, normal heart sounds and intact distal pulses.  Pulmonary/Chest: Effort normal and breath sounds normal.  Abdominal: Soft. Bowel sounds are normal. He exhibits no distension. There is no tenderness.  Musculoskeletal: Normal range of motion. He exhibits no edema.  Neurological: He is alert and oriented to person, place, and time. He has normal strength. A sensory deficit is present. No cranial nerve deficit. He exhibits normal muscle tone. Coordination normal. GCS eye subscore is 4. GCS verbal subscore is 5. GCS motor subscore is 6.  Pronator drift.  Mental status clear.  No a aphasia.  Patient does endorse significant sensory differential from light touch on the left as opposed to the right.  Right is normal and left has much diminished sensation to light touch both on upper and lower extremity.  Skin: Skin is warm, dry and intact.  Psychiatric: He has a normal mood and affect.     ED Treatments / Results  Labs (all labs ordered are listed, but only abnormal results are displayed) Labs Reviewed  COMPREHENSIVE METABOLIC PANEL - Abnormal; Notable for the following components:      Result Value   Potassium 5.4 (*)    Glucose, Bld 123 (*)    Creatinine, Ser 1.41 (*)    Total Bilirubin 1.3 (*)    GFR calc non Af  Amer 49 (*)    GFR calc Af Amer 57 (*)    All other components within normal limits  I-STAT CHEM 8, ED - Abnormal; Notable for the following components:   Potassium 5.3 (*)    Creatinine, Ser 1.30 (*)    Glucose, Bld 119 (*)    Hemoglobin 17.3 (*)    All other components within normal limits  ETHANOL  PROTIME-INR  APTT  CBC  DIFFERENTIAL  RAPID URINE DRUG SCREEN, HOSP PERFORMED  URINALYSIS, ROUTINE W REFLEX MICROSCOPIC  I-STAT TROPONIN, ED    EKG EKG Interpretation  Date/Time:  Thursday October 19 2017 12:07:17 EDT Ventricular Rate:  80 PR Interval:    QRS Duration: 94 QT Interval:  349 QTC Calculation: 403 R Axis:   60 Text Interpretation:  Sinus rhythm Baseline wander in lead(s) V2 normal, no chabge from previosu Confirmed by Charlesetta Shanks [id number]) on 10/19/2017 2:10:31 PM   Radiology Ct Head Wo Contrast  Result Date: 10/17/2017 CLINICAL DATA:  Numbness of the lips for several days, some numbness of the left side of the face EXAM: CT HEAD WITHOUT CONTRAST TECHNIQUE: Contiguous axial images were obtained from the base of the skull through the vertex without intravenous contrast. COMPARISON:  None. FINDINGS: Brain: The ventricular system is prominent as are the cortical sulci indicative of diffuse atrophy. The septum is midline in position. The fourth ventricle and basilar cisterns are unremarkable. No hemorrhage, mass lesion, or acute infarction is seen. Vascular: No vascular abnormality is noted on this unenhanced study. Skull: On bone window images, no calvarial abnormality is seen. Sinuses/Orbits: There is complete opacification of the right partition of the sphenoid sinus with some sphenoid wall thickening possibly indicating chronic right sphenoid sinusitis. The remainder of the paranasal sinuses appear well pneumatized. Other: None. IMPRESSION: 1. Diffuse changes of atrophy.  No acute intracranial abnormality. 2. 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 Electronically Signed    By: Ivar Drape M.D.   On: 10/17/2017 16:00   Mr Brain Wo Contrast  Result Date: 10/17/2017 CLINICAL DATA:  72 y/o M; numbness and tingling in the left side of the body for the last 3 days. EXAM: MRI HEAD WITHOUT CONTRAST MRA HEAD WITHOUT CONTRAST TECHNIQUE: Multiplanar, multiecho pulse sequences of the brain and surrounding structures were obtained without intravenous contrast. Angiographic images of the head were obtained using MRA technique without contrast. COMPARISON:  None. FINDINGS: MRI HEAD FINDINGS Brain: Faint subcentimeter focus of reduced diffusion within the right ventrolateral thalamus (series 100, image 32). No associated hemorrhage or mass effect. Mild chronic microvascular ischemic changes and moderate parenchymal volume loss of the brain. No extra-axial collection, hydrocephalus, or herniation. No focal mass effect of the brain. Vascular: As below. Skull and upper cervical spine: Normal marrow signal. Sinuses/Orbits: Right sphenoid sinus mucous retention cyst or polyp. Other: None. MRA HEAD FINDINGS Internal carotid arteries: Patent. Mild bilateral paraclinoid stenosis. Anterior cerebral arteries:  Patent. Middle cerebral arteries: Patent. Severe distal right M1 stenosis. Mild distal left M1 stenosis. Anterior communicating artery: Patent. Posterior communicating arteries:  Patent. Posterior cerebral arteries: Patent. Severe distal right P2 stenosis at the bifurcation. Mild bilateral P1 stenosis. Basilar artery:  Patent. Vertebral arteries:  Patent. No large vessel occlusion or aneurysm. IMPRESSION: MRI head: 1. Faint subcentimeter acute/early subacute infarction within the right ventrolateral thalamus. No associated hemorrhage or mass effect. 2. Mild chronic microvascular ischemic changes and moderate parenchymal volume loss of the brain. MRA head: 1. Patent anterior and posterior intracranial circulation. No large vessel occlusion or aneurysm. 2. Severe right distal M1 and severe distal right  P2 stenosis. Multiple segments of mild stenosis. These results will be called to the ordering clinician or representative by the Radiologist Assistant, and communication documented in the PACS or zVision Dashboard. Electronically Signed   By: Kristine Garbe M.D.   On: 10/17/2017 21:45   US Carotid Bilateral (at [facility] And [facility] Only)  Result Date: 10/18/2017 CLINICAL DATA:  TIA.  History of hypertension. EXAM: BILATERAL CAROTID DUPLEX ULTRASOUND TECHNIQUE: Pearline Cables scale imaging, color Doppler and duplex ultrasound were performed of bilateral carotid and vertebral arteries  in the neck. COMPARISON:  Brain MRI-10/17/2017 FINDINGS: Criteria: Quantification of carotid stenosis is based on velocity parameters that correlate the residual internal carotid diameter with NASCET-based stenosis levels, using the diameter of the distal internal carotid lumen as the denominator for stenosis measurement. The following velocity measurements were obtained: RIGHT ICA:  65/18 cm/sec CCA:  25/85 cm/sec SYSTOLIC ICA/CCA RATIO:  0.9 ECA:  104 cm/sec LEFT ICA:  69/24 cm/sec CCA:  27/78 cm/sec SYSTOLIC ICA/CCA RATIO:  0.8 ECA:  88 cm/sec RIGHT CAROTID ARTERY: There is a minimal amount of echogenic plaque within the right carotid bulb (image 17), not resulting in elevated peak systolic velocities within the interrogated course of the right internal carotid artery to suggest a hemodynamically significant stenosis RIGHT VERTEBRAL ARTERY:  Antegrade flow LEFT CAROTID ARTERY: There is a moderate amount of echogenic plaque within the left carotid bulb (images 50 and 52), not resulting in elevated peak systolic velocities within the interrogated course of the left internal carotid artery to suggest a hemodynamically significant stenosis. LEFT VERTEBRAL ARTERY:  Antegrade flow IMPRESSION: Minimal to moderate amount of bilateral atherosclerotic plaque, left greater than right, not resulting in a hemodynamically significant stenosis within  either internal carotid artery. Electronically Signed   By: Sandi Mariscal M.D.   On: 10/18/2017 10:31   Mr Jodene Nam Head/brain EU Cm  Result Date: 10/17/2017 CLINICAL DATA:  72 y/o M; numbness and tingling in the left side of the body for the last 3 days. EXAM: MRI HEAD WITHOUT CONTRAST MRA HEAD WITHOUT CONTRAST TECHNIQUE: Multiplanar, multiecho pulse sequences of the brain and surrounding structures were obtained without intravenous contrast. Angiographic images of the head were obtained using MRA technique without contrast. COMPARISON:  None. FINDINGS: MRI HEAD FINDINGS Brain: Faint subcentimeter focus of reduced diffusion within the right ventrolateral thalamus (series 100, image 32). No associated hemorrhage or mass effect. Mild chronic microvascular ischemic changes and moderate parenchymal volume loss of the brain. No extra-axial collection, hydrocephalus, or herniation. No focal mass effect of the brain. Vascular: As below. Skull and upper cervical spine: Normal marrow signal. Sinuses/Orbits: Right sphenoid sinus mucous retention cyst or polyp. Other: None. MRA HEAD FINDINGS Internal carotid arteries: Patent. Mild bilateral paraclinoid stenosis. Anterior cerebral arteries:  Patent. Middle cerebral arteries: Patent. Severe distal right M1 stenosis. Mild distal left M1 stenosis. Anterior communicating artery: Patent. Posterior communicating arteries:  Patent. Posterior cerebral arteries: Patent. Severe distal right P2 stenosis at the bifurcation. Mild bilateral P1 stenosis. Basilar artery:  Patent. Vertebral arteries:  Patent. No large vessel occlusion or aneurysm. IMPRESSION: MRI head: 1. Faint subcentimeter acute/early subacute infarction within the right ventrolateral thalamus. No associated hemorrhage or mass effect. 2. Mild chronic microvascular ischemic changes and moderate parenchymal volume loss of the brain. MRA head: 1. Patent anterior and posterior intracranial circulation. No large vessel occlusion or  aneurysm. 2. 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 Multiple segments of mild stenosis. These results will be called to the ordering clinician or representative by the Radiologist Assistant, and communication documented in the PACS or zVision Dashboard. Electronically Signed   By: Kristine Garbe M.D.   On: 10/17/2017 21:45   Ct Head Code Stroke Wo Contrast  Result Date: 10/19/2017 CLINICAL DATA:  Code stroke. 72 year old male with left side weakness. Last seen normal 0830 hours. EXAM: CT HEAD WITHOUT CONTRAST TECHNIQUE: Contiguous axial images were obtained from the base of the skull through the vertex without intravenous contrast. COMPARISON:  Head CT and brain MRI [place]  [facility] 10/17/2017 FINDINGS: Brain: Stable gray-white matter differentiation throughout the brain. The subtle diffusion abnormality suspected in the right thalamus two days ago is correlated with mild if any interval hypodensity (series 3, image 16). No midline shift, ventriculomegaly, mass effect, evidence of mass lesion, intracranial hemorrhage or evidence of cortically based acute infarction. Vascular: Calcified atherosclerosis at the skull base. Skull: No acute osseous abnormality identified. Sinuses/Orbits: Unchanged right sphenoid sinus opacification with mucoperiosteal thickening. The remaining visualized paranasal sinuses and mastoids are clear. Other: Stable orbit and scalp soft tissues. ASPECTS [facility] Stroke Program Early CT Score) - Ganglionic level infarction (caudate, lentiform nuclei, internal capsule, insula, M1-M3 cortex): 7 - Supraganglionic infarction (M4-M6 cortex): 3 Total score (0-10 with 10 being normal): 10 IMPRESSION: 1. Mild if any hypodensity has developed at the right thalamus correspond to the subtle diffusion abnormality suspected by MRI two days ago. 2. No acute cortically based infarct or other intracranial abnormality identified. 3. ASPECTS is 10. 4. These  results were communicated to Dr. Leonel Ramsay at 12:09 pmon 7/11/2019by text page via the [facility] messaging system. Electronically Signed   By: Genevie Ann M.D.   On: 10/19/2017 12:09    Procedures Procedures (including critical care time) CRITICAL CARE Performed by: Charlesetta Shanks   Total critical care time: 20 minutes  Critical care time was exclusive of separately billable procedures and treating other patients.  Critical care was necessary to treat or prevent imminent or life-threatening deterioration.  Critical care was time spent personally by me on the following activities: development of treatment plan with patient and/or surrogate as well as nursing, discussions with consultants, evaluation of patient's response to treatment, examination of patient, obtaining history from patient or surrogate, ordering and performing treatments and interventions, ordering and review of laboratory studies, ordering and review of radiographic studies, pulse oximetry and re-evaluation of patient's condition. Medications Ordered in ED Medications  iopamidol (ISOVUE-370) 76 % injection (has no administration in time range)     Initial Impression / Assessment and Plan / ED Course  I have reviewed the triage vital signs and the nursing notes.  Pertinent labs & imaging results that were available during my care of the patient were reviewed by me and considered in my medical decision making (see chart for details).    Consult: Dr. Leonel Ramsay consulted for code stroke.  Recommends observation admission.  Suggests avoiding hypotension, hydrating for mild AKI and repeat CT angios of brain tomorrow if kidney function permissible.  Final Clinical Impressions(s) / ED Diagnoses   Final diagnoses:  Cerebrovascular accident (CVA), unspecified mechanism [facility])   Patient presents as outlined above.  He has sudden onset of left-sided numbness.  He was just treated for stroke.  Symptoms are persisting.  No motor  weakness.  Patient has been evaluated neurology.  At this time not candidate for interventional treatment.  Recommendation is to admit to hospitalist with continued hydration and CT angiogram of the brain tomorrow if patient's BUN and creatinine have improved to make benefit outweigh risk. ED Discharge Orders    None       Charlesetta Shanks, MD 10/19/17 1415

## 2017-10-19 NOTE — ED Notes (Signed)
Attempted report 

## 2017-10-20 DIAGNOSIS — G459 Transient cerebral ischemic attack, unspecified: Secondary | ICD-10-CM

## 2017-10-20 DIAGNOSIS — I6381 Other cerebral infarction due to occlusion or stenosis of small artery: Secondary | ICD-10-CM

## 2017-10-20 DIAGNOSIS — I639 Cerebral infarction, unspecified: Secondary | ICD-10-CM

## 2017-10-20 LAB — BASIC METABOLIC PANEL
Anion gap: 6 (ref 5–15)
BUN: 14 mg/dL (ref 8–23)
CO2: 26 mmol/L (ref 22–32)
Calcium: 9.2 mg/dL (ref 8.9–10.3)
Chloride: 108 mmol/L (ref 98–111)
Creatinine, Ser: 1.12 mg/dL (ref 0.61–1.24)
GFR calc Af Amer: >60 mL/min (ref >60)
GFR calc non Af Amer: >60 mL/min (ref >60)
Glucose, Bld: 122 mg/dL — ABNORMAL HIGH (ref 70–99)
Potassium: 5 mmol/L (ref 3.5–5.1)
Sodium: 140 mmol/L (ref 135–145)

## 2017-10-20 MED ORDER — RAMIPRIL 5 MG PO CAPS: 10.0000 mg | ORAL_CAPSULE | Freq: Every day | ORAL | Status: DC

## 2017-10-20 NOTE — Progress Notes (Signed)
Pt discharged home. Discharge instructions were reviewed with the patient. PT verbalized understanding.

## 2017-10-20 NOTE — Progress Notes (Signed)
   Subjective: Mr. Woo is feeling well today. He states he still has some numbness on his left side, but it feels like it's continuing to improve.   Objective:  Vital signs in last 24 hours: Vitals:   10/19/17 2023 10/20/17 0100 10/20/17 0501 10/20/17 0735  BP: (!) 182/85 (!) 149/66 135/75 (!) 161/87  Pulse: 78 67 66 69  Resp: 18 18 18 18   Temp: 98.1 F (36.7 C) 98 F (36.7 C) 97.6 F (36.4 C) 97.6 F (36.4 C)  TempSrc: Oral Oral Oral Oral  SpO2: 99% 99% 96% 99%  Weight:      Height:       Physical Exam:  Constitution: lying supine in bed, NAD, pleasant, obese HEENT: normocephalic, atraumatic, left lid droop from previous surgery Cardio: RRR, no murmurs, no edema, +radial & pedal pulses Respiratory: clear to auscultation bilaterally, effort normal Abdominal: non-distended, NTTP, +BS MSK: strength 5/5 UE bilatearlly and LE bilaterally, sensation assymetrical, decreased along left side of face, entire left arm and leg.  Neuro: CN II-XII, negative cerebellar test Skin: clean, dry, and intact    Assessment/Plan:  Active Problems:   TIA (transient ischemic attack)  72yo male with history of right thalamic stroke 2 days ago, HTN, and left lid droop s/p complications of eye surgery several years ago presenting today for left sided numbness in his face, arm and legs. Symptoms from his initial stroke 10/17/17 mostly resolved when he left the hospital yesterday as well. Previous MRI 7/09 demonstrated subacute infarct in right ventrolateral thalamus.  TIA: Symptoms have mostly resolved. CT yesterday demonstrated slight thalamic hypodensity consistent with stroke on 7/9 with no acute changes.   - No CTA per neurology as symptoms are related to previous stroke and are to be expected - follow-up in 4 weeks [facility] - continue asa, plavix, lipitor 40mg  out pt - cont. antihypertensives   AKI: resolved. Creatinine improved, 1.12 today, baseline  unknown  - dc fluids     Dispo: Anticipated discharge in approximately today.  Molli Hazard A, DO 10/20/2017, 2:04 PM Pager: [phone]

## 2017-10-20 NOTE — H&P (Signed)
Internal Medicine Attending Admission Note Date: 10/20/2017  Patient name: Garrett Green Medical record number: [id number] Date of birth: 02/10/1946 Age: 72 y.o. Gender: male  I saw and evaluated the patient. I reviewed the resident's note and I agree with the resident's findings and plan as documented in the resident's note.  Chief Complaint(s): Recurrent left-sided numbness.  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 He was started on dual antiplatelet therapy and discharged from [facility] on July 10.  On the following morning he presented with the acute onset of numbness on the left side in the same distribution while taking a shower. The symptoms resolved prior to arrival to the [facility] emergency department. A CT scan of the head was unremarkable and he was admitted to the internal medicine teaching service for observation.  When seen on rounds the morning after admission he noted some slight numbness in the left face. He denied any weakness or dizziness. He was seen by the stroke consult service who felt the symptoms were related to his initial right thalamic stroke that had not yet stabilized since it usually takes 5-7 days. They felt no further evaluation was necessary and that he should remain on dual antiplatelet therapy for at least 3 weeks and a follow-up with the [facility] Neurology group in 4 weeks.  Physical Exam - key components related to admission:  Vitals:   10/20/17 0100 10/20/17 0501 10/20/17 0735 10/20/17 1300  BP: (!) 149/66 135/75 (!) 161/87 (!) 147/81  Pulse: 67 66 69 68  Resp: 18 18 18 18   Temp: 98 F (36.7 C) 97.6 F (36.4 C) 97.6 F (36.4 C) 97.9 F (36.6 C)  TempSrc: Oral Oral Oral Oral  SpO2: 99% 96% 99% 99%  Weight:       Height:       Gen.: Well-developed, well-nourished, man lying comfortably in bed in no acute distress. Heart: Regular rate and rhythm without murmurs, rubs, or gallops. Neuro: Cranial nerves II through XII intact. Strength 5/5 throughout. Sensation intact to light touch throughout with the exception of the left face.  Lab results:  Basic Metabolic Panel: Recent Labs    10/19/17 1148 10/19/17 1157 10/20/17 0431  NA 137 136 140  K 5.4* 5.3* 5.0  CL 104 103 108  CO2 25  --  26  GLUCOSE 123* 119* 122*  BUN 16 23 14   CREATININE 1.41* 1.30* 1.12  CALCIUM 10.0  --  9.2   Liver Function Tests: Recent Labs    10/19/17 1148  AST 27  ALT 21  ALKPHOS 67  BILITOT 1.3*  PROT 7.4  ALBUMIN 4.2   CBC: Recent Labs    10/19/17 1148 10/19/17 1157  WBC 9.7  --   NEUTROABS 5.9  --   HGB 16.9 17.3*  HCT 50.3 51.0  MCV 90.6  --   PLT 233  --    Hemoglobin A1C: Recent Labs    10/18/17 1356  HGBA1C 6.2*   Fasting Lipid Panel: Recent Labs    10/18/17 0555  CHOL 197  HDL 47  LDLCALC 104*  TRIG 230*  CHOLHDL 4.2   Coagulation: Recent Labs    10/19/17 1148  INR 0.95   Urine Drug Screen:  Opiates positive Cocaine, benzodiazepines, amphetamines, THC negative  Alcohol Level: Recent Labs  10/19/17 1150  ETH <10   Urinalysis: Line clear, straw, specific gravity 1.009, pH 5.0, glucose 50, IMA globin negative, protein negative, nitrite negative, leukocytes negative  Imaging results:   CT scan of the head without contrast: Personally reviewed. No acute bleed. I appreciate no other acute abnormalities.  Other results:  EKG: personally reviewed. Normal sinus rhythm at 80 bpm, normal axis, normal intervals, no significant Q waves, no LVH by voltage, good R wave progression, no ST or T wave changes. There are no significant changes from the previous ECG on 10/17/2017.  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 His symptoms initially resolved and he was discharged home. The following morning he presented with recurrence of the symptoms and was admitted to the internal medicine teaching service. The stroke service evaluated him and felt the symptoms were consistent with the previous stroke which had not yet stabilized since it had been less than 5-7 days. They felt no further evaluation was necessary.  1) Right thalamic stroke secondary to small vessel disease with left sided numbness of the face: We will continue dual antiplatelet therapy for 3 weeks. He will follow-up with the stroke clinic in 4 weeks. We will have him on a statin and aggressively treat his hypertension over the next 1-3 weeks.  2) Disposition: He will be discharged home today on dual platelet therapy and follow-up with the stroke clinic in 4 weeks. He will also follow-up with his primary care provider to work on risk factor modification.

## 2017-10-20 NOTE — Discharge Instructions (Signed)
° °  You were hospitalized for possible left sided numbness. Thank you for allowing [place] to be part of your care.   You will follow up with The Stroke Clinic at [facility] in 4 weeks. Office will call with appointment date and time.  Please continue taking your plavix and aspirin for three weeks. After that, continue aspirin alone.    Please call our clinic if you have any questions or concerns, we may be able to help and keep you from a long and expensive emergency room wait. Our clinic and after hours phone number is [phone], the best time to call is Monday through Friday 9 am to 4 pm but there is always someone available 24/7 if you have an emergency. If you need medication refills please notify your pharmacy one week in advance and they will send [place] a request.

## 2017-10-20 NOTE — Discharge Summary (Signed)
Name: Garrett Green MRN: [id number] DOB: 01-10-1946 72 y.o. PCP: Birdie Sons, MD  Date of Admission: 10/19/2017 11:48 AM Date of Discharge: 10/20/17 Attending Physician: Oval Linsey, MD  Discharge Diagnosis: 1. TIA   Discharge Medications: Allergies as of 10/20/2017      Reactions   Sulfa Antibiotics Other (See Comments)   Lethargic and hypotension      Medication List    TAKE these medications   aspirin 81 MG tablet Take 81 mg by mouth daily.   atorvastatin 40 MG tablet Commonly known as:  LIPITOR Take 1 tablet (40 mg total) by mouth daily at 6 PM.   Biotin 10000 MCG Tabs Take 1 tablet by mouth daily.   brimonidine 0.1 % Soln Commonly known as:  ALPHAGAN P Place 1 drop into the left eye 2 (two) times daily.   clopidogrel 75 MG tablet Commonly known as:  PLAVIX Take 1 tablet (75 mg total) by mouth daily.   fluticasone 50 MCG/ACT nasal spray Commonly known as:  FLONASE Place 2 sprays into both nostrils daily as needed for allergies.   HYDROcodone-acetaminophen 5-325 MG tablet Commonly known as:  NORCO/VICODIN Take 1 tablet by mouth every 6 (six) hours as needed.   latanoprost 0.005 % ophthalmic solution Commonly known as:  XALATAN Place 1 drop into both eyes at bedtime.   loratadine 10 MG tablet Commonly known as:  CLARITIN Take 10 mg by mouth daily as needed for allergies.   LORazepam 1 MG tablet Commonly known as:  ATIVAN Take 1 tablet (1 mg total) by mouth 3 (three) times daily as needed.   ramipril 10 MG capsule Commonly known as:  ALTACE TAKE 1 CAPSULE BY MOUTH  DAILY       Disposition and follow-up:   GarrettGarrett Green was discharged from [facility] in Stable condition.  At the hospital follow up visit please address:  1.  TIA/Stroke: assess resolution of numbness on left.   2.  Labs / imaging needed at time of follow-up: none  3.  Pending labs/ test needing follow-up: none  Follow-up  Appointments:  Follow-up Stroke Clinic at [facility] in 4 weeks. Office will call with appointment date and time.   Follow-up Information    [facility] Follow up in 4 week(s).   Specialty:  Neurology Why:  stroke clinic. office will call with appd date and time Contact information: [address] [place] [phone]       Birdie Sons, MD. Schedule an appointment as soon as possible for a visit in 1 week(s).   Specialty:  Family Medicine Contact information: [address] [place] [address] [place] Hospital Course by problem list: Garrett Green is a 72yo male with history ofright thalamicstroke 4 days ago, HTN, and left lid droop s/p complications of eye surgery several years ago who presented to [facility] for left sided numbness in his face, arm and legs. Symptoms were identical to his initial stroke 10/17/17. His symptoms from his first stroke had mostly resolved when he was discharged from [facility] as well, where he had been started on lipitor, asa, and plavix. Previous MRI 7/09 demonstrated subacute infarct in right ventrolateral thalamus. CT on admission demonstrated slight thalamic hypodensity consistent with stroke on 7/9 with no acute changes. Patient also had slight AKI on admisson, which resolved with fluid resuscitation. Initial plan per neurology  was for CTA after normalization of creatinine. It was ultimately determined his symptoms were related to his previous stroke, and no further workup was necessary until his follow-up appointment with [facility] in four weeks. He will continue his lipitor. Asa and plavix will be continued for three weeks, then asa alone.     Discharge Vitals:   BP (!) 147/81 (BP Location: Left Arm)   Pulse 68   Temp 97.9 F (36.6 C) (Oral)   Resp 18   Ht 5\' 8"  (1.727 m)   Wt 196 lb 10.4 oz (89.2 kg)   SpO2 99%    BMI 29.90 kg/m   Pertinent Labs, Studies, and Procedures:   CT 10/19/17 1. Mild if any hypodensity has developed at the right thalamus correspond to the subtle diffusion abnormality suspected by MRI two days ago. 2. No acute cortically based infarct or other intracranial abnormality identified.   By: Genevie Ann M.D.   On: 10/19/2017 12:09  Discharge Instructions: Discharge Instructions    Ambulatory referral to Neurology   Complete by:  As directed    Follow up with stroke clinic NP (Jessica Vanschaick or Cecille Rubin, if both not available, consider Dr. Antony Contras, Dr. Bess Harvest, or Dr. Sarina Ill) at [facility] in about 4 weeks.   Diet - low sodium heart healthy   Complete by:  As directed    Increase activity slowly   Complete by:  As directed       Signed: Marty Heck, DO 10/20/2017, 6:14 PM   Pager: [phone]

## 2017-10-20 NOTE — Care Management Note (Signed)
Case Management Note  Patient Details  Name: Garrett Green MRN: [id number] Date of Birth: 1945-06-03  Subjective/Objective:                    Action/Plan: Pt discharging home with self care. Pt has a PCP, insurance and transportation home.    Expected Discharge Date:  10/20/17               Expected Discharge Plan:  Home/Self Care  In-House Referral:     Discharge planning Services     Post Acute Care Choice:    Choice offered to:     DME Arranged:    DME Agency:     HH Arranged:    HH Agency:     Status of Service:  Completed, signed off  If discussed at [employer] of Stay Meetings, dates discussed:    Additional Comments:  Pollie Friar, RN 10/20/2017, 3:26 PM

## 2017-10-20 NOTE — Progress Notes (Addendum)
STROKE TEAM PROGRESS NOTE  HPI ( Dr Leonel Ramsay ) : Garrett Green is an 72 y.o. 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  Due to being out of the window he was not a tPA candidate. He was placed on Asa and Plaviz for 1 month then Plavix alone.   Today he [facility] with the same symptoms which started at 0830 while in the shower. HE notes the symptoms occur also with changes in position . His symptoms had fully resolved prior to this AM.   Date last known well: Date: 10/19/2017 Time last known well: Time: 08:30 tPA Given: No: recent stroke NIHSS 1 Modified Rankin: Rankin Score=0    INTERVAL HISTORY His wife and daughter are at the bedside.  Multiple questions and concerns about stroke signs, symptoms, how long they last, when to act, testing and prognosis, treatment. Dr. Leonie Man addressed all at length.  Vitals:   10/20/17 0100 10/20/17 0501 10/20/17 0735 10/20/17 1300  BP: (!) 149/66 135/75 (!) 161/87 (!) 147/81  Pulse: 67 66 69 68  Resp: 18 18 18 18   Temp: 98 F (36.7 C) 97.6 F (36.4 C) 97.6 F (36.4 C) 97.9 F (36.6 C)  TempSrc: Oral Oral Oral Oral  SpO2: 99% 96% 99% 99%  Weight:      Height:        CBC:  Recent Labs  Lab 10/17/17 1540 10/19/17 1148 10/19/17 1157  WBC 8.1 9.7  --   NEUTROABS 4.3 5.9  --   HGB 15.7 16.9 17.3*  HCT 44.3 50.3 51.0  MCV 90.4 90.6  --   PLT 209 233  --     Basic Metabolic Panel:  Recent Labs  Lab 10/19/17 1148 10/19/17 1157 10/20/17 0431  NA 137 136 140  K 5.4* 5.3* 5.0  CL 104 103 108  CO2 25  --  26  GLUCOSE 123* 119* 122*  BUN 16 23 14   CREATININE 1.41* 1.30* 1.12  CALCIUM 10.0  --  9.2   Lipid Panel:     Component Value Date/Time   CHOL 197 10/18/2017 0555   CHOL 179 11/30/2016 1107   TRIG 230 (H) 10/18/2017 0555   HDL 47 10/18/2017 0555   HDL 47 11/30/2016  1107   CHOLHDL 4.2 10/18/2017 0555   VLDL 46 (H) 10/18/2017 0555   LDLCALC 104 (H) 10/18/2017 0555   LDLCALC 105 (H) 11/30/2016 1107   HgbA1c:  Lab Results  Component Value Date   HGBA1C 6.2 (H) 10/18/2017   Urine Drug Screen:     Component Value Date/Time   LABOPIA POSITIVE (A) 10/19/2017 1901   COCAINSCRNUR NONE DETECTED 10/19/2017 1901   LABBENZ NONE DETECTED 10/19/2017 1901   AMPHETMU NONE DETECTED 10/19/2017 1901   THCU NONE DETECTED 10/19/2017 1901   LABBARB (A) 10/19/2017 1901    Result not available. Reagent lot number recalled by manufacturer.    Alcohol Level     Component Value Date/Time   ETH <10 10/19/2017 1150    IMAGING Ct Head Code Stroke Wo Contrast  Result Date: 10/19/2017 CLINICAL DATA:  Code stroke. 72 year old male with left side weakness. Last seen normal 0830 hours. EXAM: CT HEAD WITHOUT CONTRAST TECHNIQUE: Contiguous axial images were obtained from the base of the skull through the vertex without intravenous contrast. COMPARISON:  Head CT and brain  MRI [facility] 10/17/2017 FINDINGS: Brain: Stable gray-white matter differentiation throughout the brain. The subtle diffusion abnormality suspected in the right thalamus two days ago is correlated with mild if any interval hypodensity (series 3, image 16). No midline shift, ventriculomegaly, mass effect, evidence of mass lesion, intracranial hemorrhage or evidence of cortically based acute infarction. Vascular: Calcified atherosclerosis at the skull base. Skull: No acute osseous abnormality identified. Sinuses/Orbits: Unchanged right sphenoid sinus opacification with mucoperiosteal thickening. The remaining visualized paranasal sinuses and mastoids are clear. Other: Stable orbit and scalp soft tissues. ASPECTS [facility] Stroke Program Early CT Score) - Ganglionic level infarction (caudate, lentiform nuclei, internal capsule, insula, M1-M3 cortex): 7 - Supraganglionic infarction (M4-M6 cortex): 3  Total score (0-10 with 10 being normal): 10 IMPRESSION: 1. Mild if any hypodensity has developed at the right thalamus correspond to the subtle diffusion abnormality suspected by MRI two days ago. 2. No acute cortically based infarct or other intracranial abnormality identified. 3. ASPECTS is 10. 4. These results were communicated to Dr. Leonel Ramsay at 12:09 pmon 7/11/2019by text page via the [facility] messaging system. Electronically Signed   By: Genevie Ann M.D.   On: 10/19/2017 12:09    PHYSICAL EXAM Pleasant middle aged obese caucasian male not in distress. . Afebrile. Head is nontraumatic. Neck is supple without bruit.    Cardiac exam no murmur or gallop. Lungs are clear to auscultation. Distal pulses are well felt. Neurological Exam ;  Awake  Alert oriented x 3. Normal speech and language.eye movements full without nystagmus.fundi were not visualized. Vision acuity and fields appear normal. Hearing is normal. Palatal movements are normal. Face symmetric. Tongue midline. Normal strength, tone, reflexes and coordination. Normal sensation. Gait deferred.  ASSESSMENT/PLAN Mr. Garrett Green is a 72 y.o. male with history of R thalamic stroke 2 days ago, HTN, HLD and arthritis presenting now with the same symptoms of L sided numbness that have recurred.   Stroke:  right thalamic infarct secondary to small vessel disease   Recurrent symptoms from initial stroke. May take 5-7 days to stabilize, so recurrence of same sx likely.  Code Stroke CT head R thalamic hypodensity. ASPECTS 10.     MRI  Recently done 10/17/17 small right thalamic lacunar infarct  MRA  Recently done  Carotid Doppler  Recently done  2D Echo  Recently done  LDL 104  HgbA1c 6.2  Lovenox 40 mg sq daily for VTE prophylaxis  aspirin 81 mg daily and clopidogrel 75 mg daily prior to admission, now on same. Continue on aspirin 81 mg and plavix 75 mg daily x 3 weeks, then aspirin alone.   Therapy recommendations:  No therapy  needs  Disposition:  Return home with family  Discussed with pt/familiy:  Avoid dehydration  Increase activity slowly - start at 50% and increase from there  Diet - avoid sodas, fresh fruit and veggies recommended, limit red meat  What stroke sx to be concerned about (expect possible return of L sided numbness for approx 5-7 days)   Ok for d/c from stroke standpoint  Follow-up Stroke Clinic at [facility] in 4 weeks. Office will call with appointment date and time. Order placed.  Hypertension  Stable . BP goal normotensive  Hyperlipidemia  Home meds:  lipitor 40, resumed in hospital  LDL 104, goal < 70  Continue statin at discharge  Pre-Diabetes  HgbA1c 6.2, goal < 7.0  Other Stroke Risk Factors  Advanced age  Former Cigarette smoker  ETOH use, advised  to drink no more than 2 drink(s) a day  Hx stroke/TIA - same stroke from last week  Other Active Problems  AKI, Cr 1.3  Hospital day # 0  I have personally examined this patient, reviewed notes, independently viewed imaging studies, participated in medical decision making and plan of care.ROS completed by me personally and pertinent positives fully documented  I have made any additions or clarifications directly to the above note. Agree with note above.  89 with recurrent transient left face and body paresthesias due to right thalamic lacunar infarct from small vessel disease. Recommend dual antiplatelet therapy for 3 weeks and aggressive risk factor modification. Long discussion with the patient, daughter and granddaughter and answered questions. Follow-up as an outpatient in stroke clinic in 6 weeks. Greater than 50% time during this 25 minute visit was spent counseling and coordination of care about, stroke and answered questions. Stroke team will sign off. Kindly call for questions.  Antony Contras, MD Medical Director [facility] Pager: [phone] 10/20/2017 3:04  PM    To contact Stroke Continuity provider, please refer to [email or web address] After hours, contact General Neurology

## 2017-10-23 ENCOUNTER — Telehealth: Payer: Self-pay

## 2017-10-23 ENCOUNTER — Ambulatory Visit: Payer: Medicare Other

## 2017-10-23 NOTE — Telephone Encounter (Signed)
Transition Care Management Follow-up Telephone Call  How have you been since you were released from the hospital? Still numb on left side of body. Denies any new or worsening s/s.  Do you understand why you were in the hospital? yes  Do you have a copy of your discharge instructions Yes Do you understand the discharge instrcutions? yes  Where were you discharged to? Home  Do you have support at home? Yes    Items Reviewed:  Medications obtained Yes  Medications reviewed: Yes  Dietary changes reviewed: yes, low sodium heart healthy.  Home Health? No  DME ordered at discharge obtained? No  Medical supplies: NA    Functional Questionnaire:   Activities of Daily Living (ADLs):   He states they are independent in the following: ambulation, bathing and hygiene, feeding, continence, grooming, toileting, dressing and medication management States they require assistance with the following: N/A  Any transportation issues/concerns?: no  Any patient concerns? Will he have blood work at f/u apt?  Confirmed importance and date/time of follow-up visits scheduled with PCP: yes, 10/27/17 @ 11 AM.  Confirm appointment scheduled with specialist? Awaiting a call from [facility] to set up apt.  Confirmed with patient if condition begins to worsen call PCP or If it's emergency go to the ER.

## 2017-10-24 ENCOUNTER — Other Ambulatory Visit (HOSPITAL_COMMUNITY): Payer: Self-pay | Admitting: Interventional Radiology

## 2017-10-24 DIAGNOSIS — I771 Stricture of artery: Secondary | ICD-10-CM

## 2017-10-26 NOTE — Progress Notes (Signed)
Patient: Garrett Green Male    DOB: June 25, 1945   72 y.o.   MRN: [id number] Visit Date: 10/27/2017  Today's Provider: Lelon Huh, MD   Chief Complaint  Patient presents with  . Hospitalization Follow-up   Subjective:    HPI   Follow up Hospitalization  Patient was admitted to [facility] on 10/19/2017 and discharged on 10/20/2017. He was treated for CVA and TIA. Treatment for this included; per discharge summary patient is to continue Lipitor, aspirin, and Plavix for 3 weeks, then take Plavix monotherapy. Also patient was advised to follow-up with [facility] in 4 weeks. Stroke Clinic appt scheduled 11/24/17 @ 10:00 am. Telephone follow up was done on 10/23/2017 He reports good compliance with treatment. He reports this condition is Unchanged. Patent states he still has numbness on the left side and is a little bit hoarse, but has no weaken ess in any extremities, no difficulty talking or swallowing.   ------------------------------------------------------------------------------------     Allergies  Allergen Reactions  . Sulfa Antibiotics Other (See Comments)    Lethargic and hypotension     Current Outpatient Medications:  .  aspirin 81 MG tablet, Take 81 mg by mouth daily., Disp: , Rfl:  .  atorvastatin (LIPITOR) 40 MG tablet, Take 1 tablet (40 mg total) by mouth daily at 6 PM., Disp: 30 tablet, Rfl: 2 .  Biotin 10000 MCG TABS, Take 1 tablet by mouth daily., Disp: , Rfl:  .  brimonidine (ALPHAGAN P) 0.1 % SOLN, Place 1 drop into the left eye 2 (two) times daily., Disp: , Rfl:  .  clopidogrel (PLAVIX) 75 MG tablet, Take 1 tablet (75 mg total) by mouth daily., Disp: 30 tablet, Rfl: 2 .  fluticasone (FLONASE) 50 MCG/ACT nasal spray, Place 2 sprays into both nostrils daily as needed for allergies., Disp: 48 g, Rfl: 1 .  HYDROcodone-acetaminophen (NORCO/VICODIN) 5-325 MG tablet, Take 1 tablet by mouth every 6 (six) hours as needed., Disp: 120 tablet, Rfl: 0 .   latanoprost (XALATAN) 0.005 % ophthalmic solution, Place 1 drop into both eyes at bedtime., Disp: , Rfl:  .  loratadine (CLARITIN) 10 MG tablet, Take 10 mg by mouth daily as needed for allergies., Disp: , Rfl:  .  LORazepam (ATIVAN) 1 MG tablet, Take 1 tablet (1 mg total) by mouth 3 (three) times daily as needed. (Patient taking differently: Take 1 mg by mouth as directed. ), Disp: 10 tablet, Rfl: 1 .  ramipril (ALTACE) 10 MG capsule, TAKE 1 CAPSULE BY MOUTH  DAILY, Disp: 90 capsule, Rfl: 4  Review of Systems  Constitutional: Negative for appetite change, chills and fever.  Respiratory: Negative for chest tightness, shortness of breath and wheezing.   Cardiovascular: Negative for chest pain and palpitations.  Gastrointestinal: Negative for abdominal pain, nausea and vomiting.  Neurological: Positive for weakness and numbness.    Social History   Tobacco Use  . Smoking status: Former Smoker    Packs/day: 1.00    Years: 25.00    Pack years: 25.00    Last attempt to quit: 03/03/1988    Years since quitting: 29.6  . Smokeless tobacco: Former Network engineer Use Topics  . Alcohol use: Yes    Alcohol/week: 0.6 oz    Types: 1 Standard drinks or equivalent per week   Objective:   BP 122/74 (BP Location: Left Arm, Patient Position: Sitting, Cuff Size: Large)   Pulse 79   Temp 97.8 F (36.6 C) (Oral)  Resp 16   Wt 190 lb (86.2 kg)   SpO2 96% Comment: room air  BMI 28.89 kg/m     Physical Exam   General Appearance:    Alert, cooperative, no distress  Eyes:    PERRL, conjunctiva/corneas clear, EOM's intact       Lungs:     Clear to auscultation bilaterally, respirations unlabored  Heart:    Regular rate and rhythm  Neurologic:   Awake, alert, oriented x 3. Significantly reduced somatosensation left face trunk, UEs and LEs. +5/5 MS UEs and LEs. No cerebellar deficits, normal walking gait.          Assessment & Plan:     1. Right thalamic infarction ([place]) Stable since  discharge, no progression of symptoms. Tolerating dual antiplatelet therapy will with plan of d/c ASA in 3 more weeks. Tolerating statin well.   2. Mild concentric left ventricular hypertrophy (LVH) Asymptomatic. Compliant with medication.  Continue aggressive risk factor modification.    3. Essential (primary) hypertension Well controlled.    Follow up for CPE in one month as previously scheduled, check lipids, liver and kidney functions at follow up.        Lelon Huh, MD  [facility]

## 2017-10-27 ENCOUNTER — Encounter: Payer: Self-pay | Admitting: Family Medicine

## 2017-10-27 ENCOUNTER — Ambulatory Visit (INDEPENDENT_AMBULATORY_CARE_PROVIDER_SITE_OTHER): Payer: Medicare Other | Admitting: Family Medicine

## 2017-10-27 VITALS — BP 122/74 | HR 79 | Temp 97.8°F | Resp 16 | Wt 190.0 lb

## 2017-10-27 DIAGNOSIS — I639 Cerebral infarction, unspecified: Secondary | ICD-10-CM | POA: Diagnosis not present

## 2017-10-27 DIAGNOSIS — I517 Cardiomegaly: Secondary | ICD-10-CM

## 2017-10-27 DIAGNOSIS — I10 Essential (primary) hypertension: Secondary | ICD-10-CM | POA: Diagnosis not present

## 2017-10-27 DIAGNOSIS — I6381 Other cerebral infarction due to occlusion or stenosis of small artery: Secondary | ICD-10-CM

## 2017-10-30 ENCOUNTER — Other Ambulatory Visit: Payer: Self-pay | Admitting: Radiology

## 2017-10-31 ENCOUNTER — Encounter (HOSPITAL_COMMUNITY): Payer: Self-pay

## 2017-10-31 ENCOUNTER — Other Ambulatory Visit (HOSPITAL_COMMUNITY): Payer: Self-pay | Admitting: Interventional Radiology

## 2017-10-31 ENCOUNTER — Ambulatory Visit (HOSPITAL_COMMUNITY)
Admission: RE | Admit: 2017-10-31 | Discharge: 2017-10-31 | Disposition: A | Discharge status: Home / Self Care | Payer: Medicare Other | Source: Ambulatory Visit | Attending: Interventional Radiology | Admitting: Interventional Radiology

## 2017-10-31 DIAGNOSIS — Z7951 Long term (current) use of inhaled steroids: Secondary | ICD-10-CM | POA: Diagnosis not present

## 2017-10-31 DIAGNOSIS — Z8673 Personal history of transient ischemic attack (TIA), and cerebral infarction without residual deficits: Secondary | ICD-10-CM | POA: Diagnosis not present

## 2017-10-31 DIAGNOSIS — I6603 Occlusion and stenosis of bilateral middle cerebral arteries: Secondary | ICD-10-CM | POA: Diagnosis present

## 2017-10-31 DIAGNOSIS — I771 Stricture of artery: Secondary | ICD-10-CM

## 2017-10-31 DIAGNOSIS — Z7902 Long term (current) use of antithrombotics/antiplatelets: Secondary | ICD-10-CM | POA: Insufficient documentation

## 2017-10-31 DIAGNOSIS — E7800 Pure hypercholesterolemia, unspecified: Secondary | ICD-10-CM | POA: Insufficient documentation

## 2017-10-31 DIAGNOSIS — Z882 Allergy status to sulfonamides status: Secondary | ICD-10-CM | POA: Insufficient documentation

## 2017-10-31 DIAGNOSIS — Z87891 Personal history of nicotine dependence: Secondary | ICD-10-CM | POA: Diagnosis not present

## 2017-10-31 DIAGNOSIS — Z7982 Long term (current) use of aspirin: Secondary | ICD-10-CM | POA: Diagnosis not present

## 2017-10-31 DIAGNOSIS — I10 Essential (primary) hypertension: Secondary | ICD-10-CM | POA: Diagnosis not present

## 2017-10-31 DIAGNOSIS — K219 Gastro-esophageal reflux disease without esophagitis: Secondary | ICD-10-CM | POA: Insufficient documentation

## 2017-10-31 HISTORY — PX: IR ANGIO VERTEBRAL SEL VERTEBRAL BILAT MOD SED: IMG5369

## 2017-10-31 HISTORY — PX: IR ANGIO INTRA EXTRACRAN SEL COM CAROTID INNOMINATE BILAT MOD SED: IMG5360

## 2017-10-31 LAB — BASIC METABOLIC PANEL
Anion gap: 9 (ref 5–15)
BUN: 19 mg/dL (ref 8–23)
CO2: 23 mmol/L (ref 22–32)
Calcium: 9.6 mg/dL (ref 8.9–10.3)
Chloride: 104 mmol/L (ref 98–111)
Creatinine, Ser: 1.25 mg/dL — ABNORMAL HIGH (ref 0.61–1.24)
GFR calc Af Amer: >60 mL/min (ref >60)
GFR calc non Af Amer: 57 mL/min — ABNORMAL LOW (ref >60)
Glucose, Bld: 134 mg/dL — ABNORMAL HIGH (ref 70–99)
Potassium: 4.6 mmol/L (ref 3.5–5.1)
Sodium: 136 mmol/L (ref 135–145)

## 2017-10-31 LAB — APTT: aPTT: 29 seconds (ref 24–36)

## 2017-10-31 LAB — CBC
HCT: 43.9 % (ref 39.0–52.0)
Hemoglobin: 15.5 g/dL (ref 13.0–17.0)
MCH: 31.3 pg (ref 26.0–34.0)
MCHC: 35.3 g/dL (ref 30.0–36.0)
MCV: 88.5 fL (ref 78.0–100.0)
Platelets: 212 10*3/uL (ref 150–400)
RBC: 4.96 MIL/uL (ref 4.22–5.81)
RDW: 11.7 % (ref 11.5–15.5)
WBC: 8.6 10*3/uL (ref 4.0–10.5)

## 2017-10-31 LAB — PROTIME-INR
INR: 0.97
Prothrombin Time: 12.8 seconds (ref 11.4–15.2)

## 2017-10-31 MED ORDER — HEPARIN SODIUM (PORCINE) 1000 UNIT/ML IJ SOLN
INTRAMUSCULAR | Status: AC
  Filled 2017-10-31: qty 1

## 2017-10-31 MED ORDER — SODIUM CHLORIDE 0.9 % IV SOLN: INTRAVENOUS | Status: DC

## 2017-10-31 MED ORDER — LIDOCAINE HCL 1 % IJ SOLN
INTRAMUSCULAR | Status: AC
  Filled 2017-10-31: qty 20

## 2017-10-31 MED ORDER — FENTANYL CITRATE (PF) 100 MCG/2ML IJ SOLN
INTRAMUSCULAR | Status: AC | PRN
  Administered 2017-10-31: 25 ug via INTRAVENOUS

## 2017-10-31 MED ORDER — SODIUM CHLORIDE 0.9 % IV SOLN
INTRAVENOUS | Status: AC | PRN
  Administered 2017-10-31: 250 mL via INTRAVENOUS

## 2017-10-31 MED ORDER — MIDAZOLAM HCL 2 MG/2ML IJ SOLN
INTRAMUSCULAR | Status: AC | PRN
  Administered 2017-10-31: 1 mg via INTRAVENOUS

## 2017-10-31 MED ORDER — IOHEXOL 300 MG/ML  SOLN: 80.0000 mL | Freq: Once | INTRAMUSCULAR | Status: DC | PRN

## 2017-10-31 MED ORDER — SODIUM CHLORIDE 0.9 % IV SOLN: INTRAVENOUS | Status: AC

## 2017-10-31 MED ORDER — MIDAZOLAM HCL 2 MG/2ML IJ SOLN
INTRAMUSCULAR | Status: AC
  Filled 2017-10-31: qty 2

## 2017-10-31 MED ORDER — IOPAMIDOL (ISOVUE-300) INJECTION 61%
INTRAVENOUS | Status: AC
  Administered 2017-10-31: 10 mL
  Filled 2017-10-31: qty 50

## 2017-10-31 MED ORDER — HEPARIN SODIUM (PORCINE) 1000 UNIT/ML IJ SOLN
INTRAMUSCULAR | Status: AC | PRN
  Administered 2017-10-31: 1000 [IU] via INTRAVENOUS

## 2017-10-31 MED ORDER — LIDOCAINE HCL (PF) 1 % IJ SOLN
INTRAMUSCULAR | Status: AC | PRN
  Administered 2017-10-31: 10 mL

## 2017-10-31 MED ORDER — FENTANYL CITRATE (PF) 100 MCG/2ML IJ SOLN
INTRAMUSCULAR | Status: AC
  Filled 2017-10-31: qty 2

## 2017-10-31 NOTE — H&P (Signed)
Chief Complaint: Patient was seen in consultation today for Cerebral arteriogram at the request of Dr Royal Hawthorn  Supervising Physician: Luanne Bras  Patient Status: [facility] - Out-pt  History of Present Illness: Garrett Green is a 72 y.o. male   Left sided numbness CVA; TIA 10/17/2017 Hx HTN MR:  IMPRESSION: MRI head: 1. Faint subcentimeter acute/early subacute infarction within the right ventrolateral thalamus. No associated hemorrhage or mass effect. 2. Mild chronic microvascular ischemic changes and moderate parenchymal volume loss of the brain. MRA head: 1. Patent anterior and posterior intracranial circulation. No large vessel occlusion or aneurysm. 2. 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 Multiple segments of mild stenosis.  Started on Plavix/ASA at [facility] and was deemed appropriate to DC with follow up scheduled Returned to to [facility] ED following day with same symptoms CT IMPRESSION: 1. Mild if any hypodensity has developed at the right thalamus correspond to the subtle diffusion abnormality suspected by MRI two days ago. 2. No acute cortically based infarct or other intracranial abnormality identified. 3. ASPECTS is 10.  Now scheduled for evaluation and cerebral arteriogram with Dr Estanislado Pandy Pt still with left sided numbness 24 hrs/day No weakness No agait difficulties Speech/vison is wnl Left eye droop is post surgeries form yrs ago  Past Medical History:  Diagnosis Date  . Arthritis    "lower back" (10/19/2017)  . GERD (gastroesophageal reflux disease)   . High cholesterol 10/19/2017  . History of blood transfusion 2014   "related to back OR"  . History of chicken pox   . Hypertension    stress test- 8-9 yrs. ago, wnl, [facility]  . TIA (transient ischemic attack) 10/17/2017; 10/19/2017    Past Surgical History:  Procedure Laterality Date  . BACK SURGERY    . CATARACT EXTRACTION W/ INTRAOCULAR LENS IMPLANT Left   .  COLONOSCOPY WITH PROPOFOL N/A 03/16/2016   Procedure: COLONOSCOPY WITH PROPOFOL;  Surgeon: Manya Silvas, MD;  [facility];  Service: Endoscopy;  Laterality: N/A;  . ESOPHAGOGASTRODUODENOSCOPY (EGD) WITH ESOPHAGEAL DILATION  1990s  . LUMBAR LAMINECTOMY/DECOMPRESSION MICRODISCECTOMY Left 07/26/2012   Procedure: LUMBAR LAMINECTOMY/DECOMPRESSION MICRODISCECTOMY 1 LEVEL;  Surgeon: Eustace Moore, MD;  Location: [facility] NEURO ORS;  Service: Neurosurgery;  Laterality: Left;  lumbar five sacral one  . SHOULDER ARTHROSCOPY WITH OPEN ROTATOR CUFF REPAIR Left 1990s  . TONSILLECTOMY      Allergies: Sulfa antibiotics  Medications: Prior to Admission medications   Medication Sig Start Date End Date Taking? Authorizing Provider  aspirin 81 MG tablet Take 81 mg by mouth daily.   Yes [provider]  atorvastatin (LIPITOR) 40 MG tablet Take 1 tablet (40 mg total) by mouth daily at 6 PM. 10/18/17  Yes Demetrios Loll, MD  brimonidine (ALPHAGAN P) 0.1 % SOLN Place 1 drop into the left eye 2 (two) times daily.   Yes [provider]  clopidogrel (PLAVIX) 75 MG tablet Take 1 tablet (75 mg total) by mouth daily. 10/18/17  Yes Demetrios Loll, MD  fluticasone [facility]) 50 MCG/ACT nasal spray Place 2 sprays into both nostrils daily as needed for allergies. 11/30/16  Yes Birdie Sons, MD  HYDROcodone-acetaminophen (NORCO/VICODIN) 5-325 MG tablet Take 1 tablet by mouth every 6 (six) hours as needed. 10/04/17  Yes Birdie Sons, MD  latanoprost (XALATAN) 0.005 % ophthalmic solution Place 1 drop into both eyes at bedtime.   Yes [provider]  ramipril (ALTACE) 10 MG capsule TAKE 1 CAPSULE BY MOUTH  DAILY 01/24/17  Yes Birdie Sons, MD  Biotin 10000 MCG TABS Take 1 tablet by mouth daily.    [provider]  loratadine (CLARITIN) 10 MG tablet Take 10 mg by mouth daily as needed for allergies.    [provider]  LORazepam (ATIVAN) 1 MG tablet Take 1 tablet (1 mg total)  by mouth 3 (three) times daily as needed. Patient taking differently: Take 1 mg by mouth as directed.  11/30/16   Birdie Sons, MD     Family History  Problem Relation Age of Onset  . Heart disease Mother   . Cancer Sister   . Dementia Brother   . Dementia Sister   . Diabetes Brother   . Diabetes Sister     Social History   Socioeconomic History  . Marital status: Married    Spouse name: Not on file  . Number of children: Not on file  . Years of education: Not on file  . Highest education level: Not on file  Occupational History    Employer: RETIRED  Social Needs  . Financial resource strain: Not on file  . Food insecurity:    Worry: Not on file    Inability: Not on file  . Transportation needs:    Medical: Not on file    Non-medical: Not on file  Tobacco Use  . Smoking status: Former Smoker    Packs/day: 1.00    Years: 25.00    Pack years: 25.00    Last attempt to quit: 03/03/1988    Years since quitting: 29.6  . Smokeless tobacco: Former Network engineer and Sexual Activity  . Alcohol use: Yes    Alcohol/week: 0.6 oz    Types: 1 Standard drinks or equivalent per week  . Drug use: Never  . Sexual activity: Yes  Lifestyle  . Physical activity:    Days per week: Not on file    Minutes per session: Not on file  . Stress: Not on file  Relationships  . Social connections:    Talks on phone: Not on file    Gets together: Not on file    Attends religious service: Not on file    Active member of club or organization: Not on file    Attends meetings of clubs or organizations: Not on file    Relationship status: Not on file  Other Topics Concern  . Not on file  Social History Narrative  . Not on file    Review of Systems: A 12 point ROS discussed and pertinent positives are indicated in the HPI above.  All other systems are negative.   Vital Signs: BP 135/86   Pulse 71   Temp 97.7 F (36.5 C) (Oral)   Resp 16   Ht 5\' 8"  (1.727 m)   Wt 190 lb (86.2  kg)   SpO2 99%   BMI 28.89 kg/m   Denies N/V Denies headache or dizziness Denies gait abnormality Denies chest pain or cough Denies SOB  Physical Exam  Constitutional: He is oriented to person, place, and time. He appears well-nourished.  HENT:  Head: Atraumatic.  Eyes: EOM are normal.  Left eye droop -- remote - from previous eye surgery  Neck: Neck supple.  Cardiovascular: Normal rate, regular rhythm and normal heart sounds.  Pulmonary/Chest: Effort normal and breath sounds normal.  Abdominal: Soft. Bowel sounds are normal.  Musculoskeletal: Normal range of motion.  Neurological: He is alert and oriented to person, place, and time.  Skin: Skin is warm and dry.  Psychiatric: He has a normal mood and affect. His behavior is normal. Judgment and thought content normal.  Nursing note and vitals reviewed.   Imaging: Ct Head Wo Contrast  Result Date: 10/17/2017 CLINICAL DATA:  Numbness of the lips for several days, some numbness of the left side of the face EXAM: CT HEAD WITHOUT CONTRAST TECHNIQUE: Contiguous axial images were obtained from the base of the skull through the vertex without intravenous contrast. COMPARISON:  None. FINDINGS: Brain: The ventricular system is prominent as are the cortical sulci indicative of diffuse atrophy. The septum is midline in position. The fourth ventricle and basilar cisterns are unremarkable. No hemorrhage, mass lesion, or acute infarction is seen. Vascular: No vascular abnormality is noted on this unenhanced study. Skull: On bone window images, no calvarial abnormality is seen. Sinuses/Orbits: There is complete opacification of the right partition of the sphenoid sinus with some sphenoid wall thickening possibly indicating chronic right sphenoid sinusitis. The remainder of the paranasal sinuses appear well pneumatized. Other: None. IMPRESSION: 1. Diffuse changes of atrophy.  No acute intracranial abnormality. 2. 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 Electronically Signed   By: Ivar Drape M.D.   On: 10/17/2017 16:00   Mr Brain Wo Contrast  Result Date: 10/17/2017 CLINICAL DATA:  72 y/o M; numbness and tingling in the left side of the body for the last 3 days. EXAM: MRI HEAD WITHOUT CONTRAST MRA HEAD WITHOUT CONTRAST TECHNIQUE: Multiplanar, multiecho pulse sequences of the brain and surrounding structures were obtained without intravenous contrast. Angiographic images of the head were obtained using MRA technique without contrast. COMPARISON:  None. FINDINGS: MRI HEAD FINDINGS Brain: Faint subcentimeter focus of reduced diffusion within the right ventrolateral thalamus (series 100, image 32). No associated hemorrhage or mass effect. Mild chronic microvascular ischemic changes and moderate parenchymal volume loss of the brain. No extra-axial collection, hydrocephalus, or herniation. No focal mass effect of the brain. Vascular: As below. Skull and upper cervical spine: Normal marrow signal. Sinuses/Orbits: Right sphenoid sinus mucous retention cyst or polyp. Other: None. MRA HEAD FINDINGS Internal carotid arteries: Patent. Mild bilateral paraclinoid stenosis. Anterior cerebral arteries:  Patent. Middle cerebral arteries: Patent. Severe distal right M1 stenosis. Mild distal left M1 stenosis. Anterior communicating artery: Patent. Posterior communicating arteries:  Patent. Posterior cerebral arteries: Patent. Severe distal right P2 stenosis at the bifurcation. Mild bilateral P1 stenosis. Basilar artery:  Patent. Vertebral arteries:  Patent. No large vessel occlusion or aneurysm. IMPRESSION: MRI head: 1. Faint subcentimeter acute/early subacute infarction within the right ventrolateral thalamus. No associated hemorrhage or mass effect. 2. Mild chronic microvascular ischemic changes and moderate parenchymal volume loss of the brain. MRA head: 1. Patent anterior and posterior intracranial circulation. No large vessel occlusion or aneurysm. 2. Severe right  distal M1 and severe distal right P2 stenosis. Multiple segments of mild stenosis. These results will be called to the ordering clinician or representative by the Radiologist Assistant, and communication documented in the PACS or zVision Dashboard. Electronically Signed   By: Kristine Garbe M.D.   On: 10/17/2017 21:45   US Carotid Bilateral (at [facility] And Ap Only)  Result Date: 10/18/2017 CLINICAL DATA:  TIA.  History of hypertension. EXAM: BILATERAL CAROTID DUPLEX ULTRASOUND TECHNIQUE: Pearline Cables scale imaging, color Doppler and duplex ultrasound were performed of bilateral carotid and vertebral arteries in the neck. COMPARISON:  Brain MRI-10/17/2017 FINDINGS: Criteria: Quantification of carotid stenosis is based on velocity parameters that correlate the residual internal carotid diameter with  NASCET-based stenosis levels, using the diameter of the distal internal carotid lumen as the denominator for stenosis measurement. The following velocity measurements were obtained: RIGHT ICA:  65/18 cm/sec CCA:  64/33 cm/sec SYSTOLIC ICA/CCA RATIO:  0.9 ECA:  104 cm/sec LEFT ICA:  69/24 cm/sec CCA:  29/51 cm/sec SYSTOLIC ICA/CCA RATIO:  0.8 ECA:  88 cm/sec RIGHT CAROTID ARTERY: There is a minimal amount of echogenic plaque within the right carotid bulb (image 17), not resulting in elevated peak systolic velocities within the interrogated course of the right internal carotid artery to suggest a hemodynamically significant stenosis RIGHT VERTEBRAL ARTERY:  Antegrade flow LEFT CAROTID ARTERY: There is a moderate amount of echogenic plaque within the left carotid bulb (images 50 and 52), not resulting in elevated peak systolic velocities within the interrogated course of the left internal carotid artery to suggest a hemodynamically significant stenosis. LEFT VERTEBRAL ARTERY:  Antegrade flow IMPRESSION: Minimal to moderate amount of bilateral atherosclerotic plaque, left greater than right, not resulting in a  hemodynamically significant stenosis within either internal carotid artery. Electronically Signed   By: Sandi Mariscal M.D.   On: 10/18/2017 10:31   Mr Jodene Nam Head/brain OA Cm  Result Date: 10/17/2017 CLINICAL DATA:  72 y/o M; numbness and tingling in the left side of the body for the last 3 days. EXAM: MRI HEAD WITHOUT CONTRAST MRA HEAD WITHOUT CONTRAST TECHNIQUE: Multiplanar, multiecho pulse sequences of the brain and surrounding structures were obtained without intravenous contrast. Angiographic images of the head were obtained using MRA technique without contrast. COMPARISON:  None. FINDINGS: MRI HEAD FINDINGS Brain: Faint subcentimeter focus of reduced diffusion within the right ventrolateral thalamus (series 100, image 32). No associated hemorrhage or mass effect. Mild chronic microvascular ischemic changes and moderate parenchymal volume loss of the brain. No extra-axial collection, hydrocephalus, or herniation. No focal mass effect of the brain. Vascular: As below. Skull and upper cervical spine: Normal marrow signal. Sinuses/Orbits: Right sphenoid sinus mucous retention cyst or polyp. Other: None. MRA HEAD FINDINGS Internal carotid arteries: Patent. Mild bilateral paraclinoid stenosis. Anterior cerebral arteries:  Patent. Middle cerebral arteries: Patent. Severe distal right M1 stenosis. Mild distal left M1 stenosis. Anterior communicating artery: Patent. Posterior communicating arteries:  Patent. Posterior cerebral arteries: Patent. Severe distal right P2 stenosis at the bifurcation. Mild bilateral P1 stenosis. Basilar artery:  Patent. Vertebral arteries:  Patent. No large vessel occlusion or aneurysm. IMPRESSION: MRI head: 1. Faint subcentimeter acute/early subacute infarction within the right ventrolateral thalamus. No associated hemorrhage or mass effect. 2. Mild chronic microvascular ischemic changes and moderate parenchymal volume loss of the brain. MRA head: 1. Patent anterior and posterior intracranial  circulation. No large vessel occlusion or aneurysm. 2. 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 Multiple segments of mild stenosis. These results will be called to the ordering clinician or representative by the Radiologist Assistant, and communication documented in the PACS or zVision Dashboard. Electronically Signed   By: Kristine Garbe M.D.   On: 10/17/2017 21:45   Ct Head Code Stroke Wo Contrast  Result Date: 10/19/2017 CLINICAL DATA:  Code stroke. 71 year old male with left side weakness. Last seen normal 0830 hours. EXAM: CT HEAD WITHOUT CONTRAST TECHNIQUE: Contiguous axial images were obtained from the base of the skull through the vertex without intravenous contrast. COMPARISON:  Head CT and brain MRI [facility] 10/17/2017 FINDINGS: Brain: Stable gray-white matter differentiation throughout the brain. The subtle diffusion abnormality suspected in the right thalamus two days ago is  correlated with mild if any interval hypodensity (series 3, image 16). No midline shift, ventriculomegaly, mass effect, evidence of mass lesion, intracranial hemorrhage or evidence of cortically based acute infarction. Vascular: Calcified atherosclerosis at the skull base. Skull: No acute osseous abnormality identified. Sinuses/Orbits: Unchanged right sphenoid sinus opacification with mucoperiosteal thickening. The remaining visualized paranasal sinuses and mastoids are clear. Other: Stable orbit and scalp soft tissues. ASPECTS [facility] Stroke Program Early CT Score) - Ganglionic level infarction (caudate, lentiform nuclei, internal capsule, insula, M1-M3 cortex): 7 - Supraganglionic infarction (M4-M6 cortex): 3 Total score (0-10 with 10 being normal): 10 IMPRESSION: 1. Mild if any hypodensity has developed at the right thalamus correspond to the subtle diffusion abnormality suspected by MRI two days ago. 2. No acute cortically based infarct or other intracranial abnormality  identified. 3. ASPECTS is 10. 4. These results were communicated to Dr. Leonel Ramsay at 12:09 pmon 7/11/2019by text page via the [facility] messaging system. Electronically Signed   By: Genevie Ann M.D.   On: 10/19/2017 12:09    Labs:  CBC: Recent Labs    10/16/17 1111 10/17/17 1540 10/19/17 1148 10/19/17 1157 10/31/17 0706  WBC 7.7 8.1 9.7  --  8.6  HGB 15.8 15.7 16.9 17.3* 15.5  HCT 45.1 44.3 50.3 51.0 43.9  PLT 227 209 233  --  212    COAGS: Recent Labs    10/17/17 1540 10/19/17 1148  INR 0.95 0.95  APTT 29 27    BMP: Recent Labs    10/16/17 1111 10/17/17 1540 10/19/17 1148 10/19/17 1157 10/20/17 0431  NA 140 138 137 136 140  K 4.8 4.5 5.4* 5.3* 5.0  CL 99 107 104 103 108  CO2 25 25 25   --  26  GLUCOSE 119* 119* 123* 119* 122*  BUN 12 10 16 23 14   CALCIUM 10.2 9.1 10.0  --  9.2  CREATININE 1.16 0.99 1.41* 1.30* 1.12  GFRNONAA 64 >60 49*  --  >60  GFRAA 74 >60 57*  --  >60    LIVER FUNCTION TESTS: Recent Labs    11/30/16 1107 10/16/17 1111 10/17/17 1540 10/19/17 1148  BILITOT  --  0.6 0.7 1.3*  AST  --  18 19 27   ALT  --  16 16 21   ALKPHOS  --  78 74 67  PROT  --  7.1 7.4 7.4  ALBUMIN 4.3 4.6 4.1 4.2    TUMOR MARKERS: No results for input(s): AFPTM, CEA, CA199, CHROMGRNA in the last 8760 hours.  Assessment and Plan:  Left sided numbness since 7/9 Now on ASA/Plavix No change in symptoms Abnormal MRI/MRA Now scheduled for cerebral arteriogram Risks and benefits of cerebral angiogram with intervention were discussed with the patient including, but not limited to bleeding, infection, vascular injury, contrast induced renal failure, stroke or even death.  This interventional procedure involves the use of X-rays and because of the nature of the planned procedure, it is possible that we will have prolonged use of X-ray fluoroscopy.  Potential radiation risks to you include (but are not limited to) the following: - A slightly elevated risk for cancer   several years later in life. This risk is typically less than 0.5% percent. This risk is low in comparison to the normal incidence of human cancer, which is 33% for women and 50% for men according to the [employer]. - Radiation induced injury can include skin redness, resembling a rash, tissue breakdown / ulcers and hair loss (which can be temporary or  permanent).   The likelihood of either of these occurring depends on the difficulty of the procedure and whether you are sensitive to radiation due to previous procedures, disease, or genetic conditions.   IF your procedure requires a prolonged use of radiation, you will be notified and given written instructions for further action.  It is your responsibility to monitor the irradiated area for the 2 weeks following the procedure and to notify your physician if you are concerned that you have suffered a radiation induced injury.    All of the patient's questions were answered, patient is agreeable to proceed.  Consent signed and in chart.  Thank you for this interesting consult.  I greatly enjoyed meeting Garrett Green and look forward to participating in their care.  A copy of this report was sent to the requesting provider on this date.  Electronically Signed: Lavonia Drafts, PA-C 10/31/2017, 7:59 AM   I spent a total of  30 Minutes   in face to face in clinical consultation, greater than 50% of which was counseling/coordinating care for cerebral arteriogram

## 2017-10-31 NOTE — Procedures (Signed)
S/P 4 vessel cerebral arteriogram RT CFA approach. Findings. 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

## 2017-10-31 NOTE — Discharge Instructions (Addendum)
Cerebral Angiogram, Care After °Refer to this sheet in the next few weeks. These instructions provide you with information on caring for yourself after your procedure. Your health care provider may also give you more specific instructions. Your treatment has been planned according to current medical practices, but problems sometimes occur. Call your health care provider if you have any problems or questions after your procedure. °What can I expect after the procedure? °After your procedure, it is typical to have the following: °· Bruising at the catheter insertion site that usually fades within 1-2 weeks. °· Blood collecting in the tissue (hematoma) that may be painful to the touch. It should usually decrease in size and tenderness within 1-2 weeks. °· A mild headache. ° °Follow these instructions at home: °· Take medicines only as directed by your health care provider. °· You may shower 24-48 hours after the procedure or as directed by your health care provider. Remove the bandage (dressing) and gently wash the site with plain soap and water. Pat the area dry with a clean towel. Do not rub the site, because this may cause bleeding. °· Do not take baths, swim, or use a hot tub until your health care provider approves. °· Check your insertion site every day for redness, swelling, or drainage. °· Do not apply powder or lotion to the site. °· Do not lift over 10 lb (4.5 kg) for 5 days after your procedure or as directed by your health care provider. °· Ask your health care provider when it is okay to: °? Return to work or school. °? Resume usual physical activities or sports. °? Resume sexual activity. °· Do not drive home if you are discharged the same day as the procedure. Have someone else drive you. °· You may drive 24 hours after the procedure unless otherwise instructed by your health care provider. °· Do not operate machinery or power tools for 24 hours after the procedure or as directed by your health care  provider. °· If your procedure was done as an outpatient procedure, which means that you went home the same day as your procedure, a responsible adult should be with you for the first 24 hours after you arrive home. °· Keep all follow-up visits as directed by your health care provider. This is important. °Contact a health care provider if: °· You have a fever. °· You have chills. °· You have increased bleeding from the catheter insertion site. Hold pressure on the site. °Get help right away if: °· You have vision changes or loss of vision. °· You have numbness or weakness on one side of your body. °· You have difficulty talking, or you have slurred speech or cannot speak (aphasia). °· You feel confused or have difficulty remembering. °· You have unusual pain at the catheter insertion site. °· You have redness, warmth, or swelling at the catheter insertion site. °· You have drainage (other than a small amount of blood on the dressing) from the catheter insertion site. °· The catheter insertion site is bleeding, and the bleeding does not stop after 30 minutes of holding steady pressure on the site. °These symptoms may represent a serious problem that is an emergency. Do not wait to see if the symptoms will go away. Get medical help right away. Call your local emergency services (911 in [place] Do not drive yourself to the hospital. °This information is not intended to replace advice given to you by your health care provider. Make sure you discuss any questions   you have with your health care provider. °Document Released: 08/12/2013 Document Revised: 09/03/2015 Document Reviewed: 04/10/2013 °Elsevier Interactive Patient Education © 2017 [employer]vier [employer] °Moderate Conscious Sedation, Adult, Care After °These instructions provide you with information about caring for yourself after your procedure. Your health care provider may also give you more specific instructions. Your treatment has been planned according to current  medical practices, but problems sometimes occur. Call your health care provider if you have any problems or questions after your procedure. °What can I expect after the procedure? °After your procedure, it is common: °· To feel sleepy for several hours. °· To feel clumsy and have poor balance for several hours. °· To have poor judgment for several hours. °· To vomit if you eat too soon. ° °Follow these instructions at home: °For at least 24 hours after the procedure: ° °· Do not: °? Participate in activities where you could fall or become injured. °? Drive. °? Use heavy machinery. °? Drink alcohol. °? Take sleeping pills or medicines that cause drowsiness. °? Make important decisions or sign legal documents. °? Take care of children on your own. °· Rest. °Eating and drinking °· Follow the diet recommended by your health care provider. °· If you vomit: °? Drink water, juice, or soup when you can drink without vomiting. °? Make sure you have little or no nausea before eating solid foods. °General instructions °· Have a responsible adult stay with you until you are awake and alert. °· Take over-the-counter and prescription medicines only as told by your health care provider. °· If you smoke, do not smoke without supervision. °· Keep all follow-up visits as told by your health care provider. This is important. °Contact a health care provider if: °· You keep feeling nauseous or you keep vomiting. °· You feel light-headed. °· You develop a rash. °· You have a fever. °Get help right away if: °· You have trouble breathing. °This information is not intended to replace advice given to you by your health care provider. Make sure you discuss any questions you have with your health care provider. °Document Released: 01/16/2013 Document Revised: 08/31/2015 Document Reviewed: 07/18/2015 °Elsevier Interactive Patient Education © 2018 Elsevier Inc. ° °

## 2017-11-01 ENCOUNTER — Telehealth: Payer: Self-pay | Admitting: Family Medicine

## 2017-11-01 NOTE — Telephone Encounter (Signed)
Pt's wife Letta Median stated pt had an angiogram recently and the radiology team advised to ask Dr. Caryn Section to check pt's kidney function Friday 11/03/17. Letta Median is requesting lab slip. Please advise. Thanks TNP

## 2017-11-01 NOTE — Telephone Encounter (Signed)
I don't understand, he just had kidney functions checked yesterday. Why do they want it checked Friday?

## 2017-11-01 NOTE — Telephone Encounter (Signed)
Garrett Green stated Dr. Estanislado Pandy advised patient yesterday to have kidney functions rechecked by his pcp. Garrett Green stated she doesn't remember exactly why, she just remembers Dr. Estanislado Pandy saying pt's kidney functions were abnormal. Please advise?

## 2017-11-01 NOTE — Telephone Encounter (Signed)
Please advise 

## 2017-11-02 ENCOUNTER — Other Ambulatory Visit: Payer: Self-pay

## 2017-11-02 NOTE — Telephone Encounter (Signed)
Pt's wife Letta Median called back to see if she can bring pt in tomorrow to have his kidney function rechecked. Please advise. Thanks TNP

## 2017-11-02 NOTE — Patient Outreach (Signed)
[facility]) Care Management  11/02/2017  Garrett Green 11/24/1945 [id number]  EMMI: stroke red alert Referral date: 11/02/17 Referral reason: feeling worse overall: yes Insurance: united [employer] care Day # 9 Attempt #1  Telephone call to patient regarding referral. Unable to reach patient. HIPAA compliant voice message left with call back phone number.   PLAN: RNCM will attempt 2nd telephone call to patient within 4 business days. RNCM will send outreach letter.   Quinn Plowman RN,BSN,CCM [facility] Telephonic  [phone]

## 2017-11-02 NOTE — Telephone Encounter (Signed)
Patient's wife was notified. Expressed understanding.

## 2017-11-02 NOTE — Telephone Encounter (Signed)
It was just barely abnormal. And won't change much in just a couple of days. He needs to drink more water, 2-3 more glasses a day than he usual drinks, then recheck renal panel at his follow up in August.

## 2017-11-03 ENCOUNTER — Other Ambulatory Visit: Payer: Self-pay

## 2017-11-03 NOTE — Patient Outreach (Signed)
[facility] [facility]) Care Management  11/03/2017  TAELOR MONCADA 1945/09/22 [id number]    EMMI: stroke red alert Referral date: 11/02/17 Referral reason: feeling worse overall: yes Insurance: united health care Day # 9 Attempt #2  Second telephone call to patient regarding referral. Unable to reach patient. HIPAA compliant voice message left with call back phone number.   PLAN: RNCM will attempt 3rd  telephone call to patient within 4 business days.   Quinn Plowman RN,BSN,CCM [facility] Telephonic  [phone]

## 2017-11-06 ENCOUNTER — Other Ambulatory Visit: Payer: Self-pay

## 2017-11-06 ENCOUNTER — Encounter (HOSPITAL_COMMUNITY): Payer: Self-pay | Admitting: Interventional Radiology

## 2017-11-06 NOTE — Patient Outreach (Signed)
[place] [facility] [facility]) Care Management  11/06/2017  KIRK BASQUEZ 1945/12/17 [id number]   EMMI:stroke red alert Referral date:11/02/17 Referral reason:feeling worse overall: yes Insurance: united health care Day #9   3rd telephone call to patient.  No answer.  HIPAA compliant voice message left.  Plan: RN CM will report to assigned CM of call attempt and she will close case when appropriate.    Jone Baseman, RN, MSN [place] [facility] Care Management Coordinator Direct Line [phone] Cell [phone] Toll Free: [phone]  Fax: [phone]

## 2017-11-07 ENCOUNTER — Encounter: Payer: Self-pay | Admitting: Family Medicine

## 2017-11-07 DIAGNOSIS — I679 Cerebrovascular disease, unspecified: Secondary | ICD-10-CM | POA: Insufficient documentation

## 2017-11-17 ENCOUNTER — Other Ambulatory Visit: Payer: Self-pay

## 2017-11-17 NOTE — Patient Outreach (Signed)
[place] [facility]) Care Management  11/17/2017  Garrett Green 05-12-1945 [id number]   No response after 3 telephone calls and outreach letter attempt.  PLAN: RNCM will close patient due to being unable to reach.   RNCM will send closure notification to patient's primary MD   Quinn Plowman RN,BSN,CCM [facility] Telephonic  [phone]

## 2017-11-24 ENCOUNTER — Encounter: Payer: Self-pay | Admitting: Adult Health

## 2017-11-24 ENCOUNTER — Ambulatory Visit (INDEPENDENT_AMBULATORY_CARE_PROVIDER_SITE_OTHER): Payer: Medicare Other | Admitting: Adult Health

## 2017-11-24 VITALS — BP 116/68 | HR 66 | Ht 68.0 in | Wt 193.2 lb

## 2017-11-24 DIAGNOSIS — I639 Cerebral infarction, unspecified: Secondary | ICD-10-CM

## 2017-11-24 DIAGNOSIS — I10 Essential (primary) hypertension: Secondary | ICD-10-CM | POA: Diagnosis not present

## 2017-11-24 DIAGNOSIS — I6381 Other cerebral infarction due to occlusion or stenosis of small artery: Secondary | ICD-10-CM

## 2017-11-24 DIAGNOSIS — E785 Hyperlipidemia, unspecified: Secondary | ICD-10-CM | POA: Diagnosis not present

## 2017-11-24 DIAGNOSIS — R7303 Prediabetes: Secondary | ICD-10-CM | POA: Diagnosis not present

## 2017-11-24 DIAGNOSIS — R202 Paresthesia of skin: Secondary | ICD-10-CM

## 2017-11-24 DIAGNOSIS — R200 Anesthesia of skin: Secondary | ICD-10-CM

## 2017-11-24 MED ORDER — GABAPENTIN 300 MG PO CAPS: 300.0000 mg | ORAL_CAPSULE | Freq: Two times a day (BID) | ORAL | 3 refills | Status: DC

## 2017-11-24 NOTE — Patient Instructions (Signed)
Continue clopidogrel 75 mg daily  and lipitor  for secondary stroke prevention  Start gabapentin 300mg  twice a day for nerve pain - if you are tolerating well but continue to have nerve pain, let me know and we can increase dose. If you are unable to tolerate, let me know and we will change medications  If your headaches continue after 1 week, let me know and we will consider starting Topamax for headache control  Do neck exercises to assist with tension headache - you can also take tylenol for mild pain - avoid advil, aleve or naproxen   Also do stress reduction techniques that are provided  Continue to follow up with PCP regarding cholesterol and blood pressure management   Continue to monitor blood pressure at home  Maintain strict control of hypertension with blood pressure goal below 130/90, diabetes with hemoglobin A1c goal below 6.5% and cholesterol with LDL cholesterol (bad cholesterol) goal below 70 mg/dL. I also advised the patient to eat a healthy diet with plenty of whole grains, cereals, fruits and vegetables, exercise regularly and maintain ideal body weight.  Followup in the future with me in 3 months or call earlier if needed       Thank you for coming to see [place] at [facility]. I hope we have been able to provide you high quality care today.  You may receive a patient satisfaction survey over the next few weeks. We would appreciate your feedback and comments so that we may continue to improve ourselves and the health of our patients.      Neck Exercises Neck exercises can be important for many reasons:  They can help you to improve and maintain flexibility in your neck. This can be especially important as you age.  They can help to make your neck stronger. This can make movement easier.  They can reduce or prevent neck pain.  They may help your upper back.  Ask your health care provider which neck exercises would be best for  you. Exercises Neck Press Repeat this exercise 10 times. Do it first thing in the morning and right before bed or as told by your health care provider. 1. Lie on your back on a firm bed or on the floor with a pillow under your head. 2. Use your neck muscles to push your head down on the pillow and straighten your spine. 3. Hold the position as well as you can. Keep your head facing up and your chin tucked. 4. Slowly count to 5 while holding this position. 5. Relax for a few seconds. Then repeat.  Isometric Strengthening Do a full set of these exercises 2 times a day or as told by your health care provider. 1. Sit in a supportive chair and place your hand on your forehead. 2. Push forward with your head and neck while pushing back with your hand. Hold for 10 seconds. 3. Relax. Then repeat the exercise 3 times. 4. Next, do thesequence again, this time putting your hand against the back of your head. Use your head and neck to push backward against the hand pressure. 5. Finally, do the same exercise on either side of your head, pushing sideways against the pressure of your hand.  Prone Head Lifts Repeat this exercise 5 times. Do this 2 times a day or as told by your health care provider. 1. Lie face-down, resting on your elbows so that your chest and upper back are raised. 2. Start with your head facing  downward, near your chest. Position your chin either on or near your chest. 3. Slowly lift your head upward. Lift until you are looking straight ahead. Then continue lifting your head as far back as you can stretch. 4. Hold your head up for 5 seconds. Then slowly lower it to your starting position.  Supine Head Lifts Repeat this exercise 8-10 times. Do this 2 times a day or as told by your health care provider. 1. Lie on your back, bending your knees to point to the ceiling and keeping your feet flat on the floor. 2. Lift your head slowly off the floor, raising your chin toward your  chest. 3. Hold for 5 seconds. 4. Relax and repeat.  Scapular Retraction Repeat this exercise 5 times. Do this 2 times a day or as told by your health care provider. 1. Stand with your arms at your sides. Look straight ahead. 2. Slowly pull both shoulders backward and downward until you feel a stretch between your shoulder blades in your upper back. 3. Hold for 10-30 seconds. 4. Relax and repeat.  Contact a health care provider if:  Your neck pain or discomfort gets much worse when you do an exercise.  Your neck pain or discomfort does not improve within 2 hours after you exercise. If you have any of these problems, stop exercising right away. Do not do the exercises again unless your health care provider says that you can. Get help right away if:  You develop sudden, severe neck pain. If this happens, stop exercising right away. Do not do the exercises again unless your health care provider says that you can. Exercises Neck Stretch  Repeat this exercise 3-5 times. 1. Do this exercise while standing or while sitting in a chair. 2. Place your feet flat on the floor, shoulder-width apart. 3. Slowly turn your head to the right. Turn it all the way to the right so you can look over your right shoulder. Do not tilt or tip your head. 4. Hold this position for 10-30 seconds. 5. Slowly turn your head to the left, to look over your left shoulder. 6. Hold this position for 10-30 seconds.  Neck Retraction Repeat this exercise 8-10 times. Do this 3-4 times a day or as told by your health care provider. 1. Do this exercise while standing or while sitting in a sturdy chair. 2. Look straight ahead. Do not bend your neck. 3. Use your fingers to push your chin backward. Do not bend your neck for this movement. Continue to face straight ahead. If you are doing the exercise properly, you will feel a slight sensation in your throat and a stretch at the back of your neck. 4. Hold the stretch for 1-2  seconds. Relax and repeat.  This information is not intended to replace advice given to you by your health care provider. Make sure you discuss any questions you have with your health care provider. Document Released: 03/09/2015 Document Revised: 09/03/2015 Document Reviewed: 10/06/2014 Elsevier Interactive Patient Education  2018 [place] Stress Reduction Mindfulness-based stress reduction (MBSR) is a program that helps people learn to practice mindfulness. Mindfulness is the practice of intentionally paying attention to the present moment. It can be learned and practiced through techniques such as education, breathing exercises, meditation, and yoga. MBSR includes several mindfulness techniques in one program. MBSR works best when you understand the treatment, are willing to try new things, and can commit to spending time practicing what you  learn. MBSR training may include learning about:  How your emotions, thoughts, and reactions affect your body.  New ways to respond to things that cause negative thoughts to start (triggers).  How to notice your thoughts and let go of them.  Practicing awareness of everyday things that you normally do without thinking.  The techniques and goals of different types of meditation.  What are the benefits of MBSR? MBSR can have many benefits, which include helping you to:  Develop self-awareness. This refers to knowing and understanding yourself.  Learn skills and attitudes that help you to participate in your own health care.  Learn new ways to care for yourself.  Be more accepting about how things are, and let things go.  Be less judgmental and approach things with an open mind.  Be patient with yourself and trust yourself more.  MBSR has also been shown to:  Reduce negative emotions, such as depression and anxiety.  Improve memory and focus.  Change how you sense and approach pain.  Boost your body's  ability to fight infections.  Help you connect better with other people.  Improve your sense of well-being.  Follow these instructions at home:  Find a local in-person or online MBSR program.  Set aside some time regularly for mindfulness practice.  Find a mindfulness practice that works best for you. This may include one or more of the following: ? Meditation. Meditation involves focusing your mind on a certain thought or activity. ? Breathing awareness exercises. These help you to stay present by focusing on your breath. ? Body scan. For this practice, you lie down and pay attention to each part of your body from head to toe. You can identify tension and soreness and intentionally relax parts of your body. ? Yoga. Yoga involves stretching and breathing, and it can improve your ability to move and be flexible. It can also provide an experience of testing your body's limits, which can help you release stress. ? Mindful eating. This way of eating involves focusing on the taste, texture, color, and smell of each bite of food. Because this slows down eating and helps you feel full sooner, it can be an important part of a weight-loss plan.  Find a podcast or recording that provides guidance for breathing awareness, body scan, or meditation exercises. You can listen to these any time when you have a free moment to rest without distractions.  Follow your treatment plan as told by your health care provider. This may include taking regular medicines and making changes to your diet or lifestyle as recommended. How to practice mindfulness To do a basic awareness exercise:  Find a comfortable place to sit.  Pay attention to the present moment. Observe your thoughts, feelings, and surroundings just as they are.  Avoid placing judgment on yourself, your feelings, or your surroundings. Make note of any judgment that comes up, and let it go.  Your mind may wander, and that is okay. Make note of when  your thoughts drift, and return your attention to the present moment.  To do basic mindfulness meditation:  Find a comfortable place to sit. This may include a stable chair or a firm floor cushion. ? Sit upright with your back straight. Let your arms fall next to your side with your hands resting on your legs. ? If sitting in a chair, rest your feet flat on the floor. ? If sitting on a cushion, cross your legs in front of you.  Keep your  head in a neutral position with your chin dropped slightly. Relax your jaw and rest the tip of your tongue on the roof of your mouth. Drop your gaze to the floor. You can close your eyes if you like.  Breathe normally and pay attention to your breath. Feel the air moving in and out of your nose. Feel your belly expanding and relaxing with each breath.  Your mind may wander, and that is okay. Make note of when your thoughts drift, and return your attention to your breath.  Avoid placing judgment on yourself, your feelings, or your surroundings. Make note of any judgment or feelings that come up, let them go, and bring your attention back to your breath.  When you are ready, lift your gaze or open your eyes. Pay attention to how your body feels after the meditation.  Where to find more information: You can find more information about MBSR from:  Your health care provider.  Community-based meditation centers or programs.  Programs offered near you.  Summary  Mindfulness-based stress reduction (MBSR) is a program that teaches you how to intentionally pay attention to the present moment. It is used with other treatments to help you cope better with daily stress, emotions, and pain.  MBSR focuses on developing self-awareness, which allows you to respond to life stress without judgment or negative emotions.  MBSR programs may involve learning different mindfulness practices, such as breathing exercises, meditation, yoga, body scan, or mindful eating. Find  a mindfulness practice that works best for you, and set aside time for it on a regular basis. This information is not intended to replace advice given to you by your health care provider. Make sure you discuss any questions you have with your health care provider. Document Released: 08/04/2016 Document Revised: 08/04/2016 Document Reviewed: 08/04/2016 Elsevier Interactive Patient Education  [employer].

## 2017-11-24 NOTE — Progress Notes (Signed)
I agree with the above plan 

## 2017-11-24 NOTE — Progress Notes (Signed)
[facility] [address] [place]. [place] [address] [phone]       OFFICE FOLLOW UP NOTE  Mr. Garrett Green Date of Birth:  21-Jul-1945 Medical Record Number:  [id number]   Reason for Referral:  hospital stroke follow up  CHIEF COMPLAINT:  Chief Complaint  Patient presents with  . 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 History obtained from patient, wife and chart review. Reviewed all radiology images and labs personally.  Mr. Garrett Green is a 72 y.o. male with history of R thalamic stroke 2 days ago at [facility], HTN, HLD and arthritis  who presented to [facility] with the same symptoms of L sided numbness that have recurred.   CT head reviewed and showed mild hypodensity which had developed around the right thalamus corresponds to subtle diffusion abnormality suspected by MRI that was performed 2 days prior but no other acute abnormality identified.  MRI head reviewed that was performed at [facility] showed acute/early subacute infarction within the right ventral lateral thalamus.  MRA head reviewed from [place] [facility] that showed patent anterior and posterior intracranial circulation without large vessel occlusion or aneurysm but did show severe right distal M1 and severe distal right P2 stenosis with multiple segments of mild stenosis.  2D echo was within normal limits at [facility] with a EF of 60 to 65%.  It was recommended for aspirin and Plavix for 1 month and then Plavix alone.  LDL 104 and recommended to continue Lipitor 40 mg.  A1c satisfactory at 6.2.  At [facility], it was recommended for patient to continue DAPT for 3 weeks and then aspirin alone along with aggressive risk factor modification.  Patient was discharged home in stable condition without needed  therapies. Patient underwent cerebral angiogram on 10/31/2017 which showed approximately 60 to 70% stenosis of the right MCA M1 segment and approximately 50 to 60% stenosis of the left MCA M1 segment secondary to atherosclerotic disease, severe focal stenosis of the right posterior inferior cerebral artery and its proximal one third and moderately severe stenosis of the left posterior inferior cerebral artery and its proximal one third, high-grade arthroscopic stenosis of the right posterior cerebral artery in the distal P2 segment and moderate atherosclerotic stenosis of the distal left posterior cerebral artery P1 segment.  It was determined that as patient was stable neurologically to continue with conservative management with his current antiplatelet regimen.  Recommended follow-up MRI of the brain and MRA of the brain in 6 months.  Patient is being seen today for hospital stroke follow-up and is accompanied by his wife.  He continues to have left-sided paresthesia.  He states his leg continues with a numb sensation but his left arm up to his shoulder along with left abdominal and rib area continues to have a "weird sensation".  He states when he moves his arm he gets a tightness sensation.  He also states with increased activity he begins to have shooting pains that go up into his neck and results in a headache.  At times these headaches can be debilitating where he needs to lay down.  Denies migraine associated symptoms such as photophobia, phonophobia or nausea/vomiting.  Denies any history of headaches in the past or history of migraines.  Denies any visual changes  with these headaches or any other neurological symptom.  Patient completed 3-week DAPT with aspirin and Plavix and continues on Plavix alone stating he was told by Dr. Leonie Man to stop aspirin after 3 weeks.  He denies bleeding or bruising with the use of Plavix.  He continues Lipitor without side effects of myalgias.  Blood pressure satisfactory at  116/68 and they do monitor this at home.  He has returned to doing all previous activities but is concerned regarding increase of pain in left arm with activity and residual headache.  Patient states he has drastically changed his diet to assist with reducing cholesterol levels, A1c level and avoids sodium intake.  Denies new or worsening stroke/TIA symptoms.     ROS:   14 system review of systems performed and negative with exception of left-sided numbness and dizziness  PMH:  Past Medical History:  Diagnosis Date  . Arthritis    "lower back" (10/19/2017)  . GERD (gastroesophageal reflux disease)   . High cholesterol 10/19/2017  . History of blood transfusion 2014   "related to back OR"  . History of chicken pox   . Hypertension    stress test- 8-9 yrs. ago, [facility], [facility]  . Stroke ([place])   . TIA (transient ischemic attack) 10/17/2017; 10/19/2017    PSH:  Past Surgical History:  Procedure Laterality Date  . BACK SURGERY    . CATARACT EXTRACTION W/ INTRAOCULAR LENS IMPLANT Left   . COLONOSCOPY WITH PROPOFOL N/A 03/16/2016   Procedure: COLONOSCOPY WITH PROPOFOL;  Surgeon: Manya Silvas, MD;  Location: [facility];  Service: Endoscopy;  Laterality: N/A;  . ESOPHAGOGASTRODUODENOSCOPY (EGD) WITH ESOPHAGEAL DILATION  1990s  . IR ANGIO INTRA EXTRACRAN SEL COM CAROTID INNOMINATE BILAT MOD SED  10/31/2017  . IR ANGIO VERTEBRAL SEL VERTEBRAL BILAT MOD SED  10/31/2017  . LUMBAR LAMINECTOMY/DECOMPRESSION MICRODISCECTOMY Left 07/26/2012   Procedure: LUMBAR LAMINECTOMY/DECOMPRESSION MICRODISCECTOMY 1 LEVEL;  Surgeon: Eustace Moore, MD;  Location: [place] [facility];  Service: Neurosurgery;  Laterality: Left;  lumbar five sacral one  . SHOULDER ARTHROSCOPY WITH OPEN ROTATOR CUFF REPAIR Left 1990s  . TONSILLECTOMY      Social History:  Social History   Socioeconomic History  . Marital status: Married    Spouse name: Not on file  . Number of children: Not on file  . Years of education: Not on  file  . Highest education level: Not on file  Occupational History    Employer: RETIRED  Social Needs  . Financial resource strain: Not on file  . Food insecurity:    Worry: Not on file    Inability: Not on file  . Transportation needs:    Medical: Not on file    Non-medical: Not on file  Tobacco Use  . Smoking status: Former Smoker    Packs/day: 1.00    Years: 25.00    Pack years: 25.00    Last attempt to quit: 03/03/1988    Years since quitting: 29.7  . Smokeless tobacco: Never Used  Substance and Sexual Activity  . Alcohol use: Yes    Alcohol/week: 1.0 standard drinks    Types: 1 Standard drinks or equivalent per week  . Drug use: Never  . Sexual activity: Yes  Lifestyle  . Physical activity:    Days per week: Not on file    Minutes per session: Not on file  . Stress: Not on file  Relationships  . Social connections:    Talks on phone: Not on file  Gets together: Not on file    Attends religious service: Not on file    Active member of club or organization: Not on file    Attends meetings of clubs or organizations: Not on file    Relationship status: Not on file  . Intimate partner violence:    Fear of current or ex partner: Not on file    Emotionally abused: Not on file    Physically abused: Not on file    Forced sexual activity: Not on file  Other Topics Concern  . Not on file  Social History Narrative  . Not on file    Family History:  Family History  Problem Relation Age of Onset  . Heart disease Mother   . Cancer Sister   . Dementia Brother   . Dementia Sister   . Diabetes Brother   . Diabetes Sister     Medications:   Current Outpatient Medications on File Prior to Visit  Medication Sig Dispense Refill  . Aspirin-Calcium Carbonate 81-777 MG TABS Take by mouth.    Marland Kitchen atorvastatin (LIPITOR) 40 MG tablet Take 1 tablet (40 mg total) by mouth daily at 6 PM. 30 tablet 2  . Biotin 10000 MCG TABS Take 1 tablet by mouth daily.    . brimonidine  (ALPHAGAN P) 0.1 % SOLN Place 1 drop into the left eye 2 (two) times daily.    . brimonidine (ALPHAGAN) 0.15 % ophthalmic solution     . clopidogrel (PLAVIX) 75 MG tablet Take 1 tablet (75 mg total) by mouth daily. 30 tablet 2  . fluticasone (FLONASE) 50 MCG/ACT nasal spray Place 2 sprays into both nostrils daily as needed for allergies. 48 g 1  . HYDROcodone-acetaminophen (NORCO/VICODIN) 5-325 MG tablet Take 1 tablet by mouth every 6 (six) hours as needed. 120 tablet 0  . latanoprost (XALATAN) 0.005 % ophthalmic solution Place 1 drop into both eyes at bedtime.    Marland Kitchen loratadine (CLARITIN) 10 MG tablet Take 10 mg by mouth daily as needed for allergies.    Marland Kitchen LORazepam (ATIVAN) 1 MG tablet Take 1 tablet (1 mg total) by mouth 3 (three) times daily as needed. (Patient taking differently: Take 1 mg by mouth as directed. ) 10 tablet 1  . Omega-3 Fatty Acids (FISH OIL) 1200 MG CAPS Take by mouth.    . ramipril (ALTACE) 10 MG capsule TAKE 1 CAPSULE BY MOUTH  DAILY 90 capsule 4   No current facility-administered medications on file prior to visit.     Allergies:   Allergies  Allergen Reactions  . Sulfa Antibiotics Other (See Comments)    Lethargic and hypotension     Physical Exam  Vitals:   11/24/17 0955  BP: 116/68  Pulse: 66  Weight: 193 lb 3.2 oz (87.6 kg)  Height: 5\' 8"  (1.727 m)   Body mass index is 29.38 kg/m. No exam data present  General: well developed, well nourished, seated, in no evident distress Head: head normocephalic and atraumatic.   Neck: supple with no carotid or supraclavicular bruits Cardiovascular: regular rate and rhythm, no murmurs Musculoskeletal: no deformity Skin:  no rash/petichiae Vascular:  Normal pulses all extremities  Neurologic Exam Mental Status: Awake and fully alert. Oriented to place and time. Recent and remote memory intact. Attention span, concentration and fund of knowledge appropriate. Mood and affect appropriate.  No flowsheet data  found. Cranial Nerves: Fundoscopic exam reveals sharp disc margins. Pupils equal, briskly reactive to light. Extraocular movements full without nystagmus. Visual fields  full to confrontation. Hearing intact. Facial sensation intact. Face, tongue, palate moves normally and symmetrically.  Motor: Normal bulk and tone. Normal strength in all tested extremity muscles. Sensory.:  Decreased sensation on left side compared to right side of upper and lower extremities Coordination: Rapid alternating movements normal in all extremities. Finger-to-nose and heel-to-shin performed accurately bilaterally. Gait and Station: Arises from chair without difficulty. Stance is normal. Gait demonstrates normal stride length and balance . Able to heel, toe and tandem walk without difficulty.  Reflexes: 1+ and symmetric. Toes downgoing.    NIHSS  1 Modified Rankin  1   Diagnostic Data (Labs, Imaging, Testing)  CT head without contrast ([place] [facility]) 10/17/2017 IMPRESSION: 1. Diffuse changes of atrophy.  No acute intracranial abnormality. 2. 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  MR brain without contrast ([place] [facility]) MR MRA head without contrast ([place] [facility]) 10/17/2017 IMPRESSION: MRI head: 1. Faint subcentimeter acute/early subacute infarction within the right ventrolateral thalamus. No associated hemorrhage or mass effect. 2. Mild chronic microvascular ischemic changes and moderate parenchymal volume loss of the brain. MRA head: 1. Patent anterior and posterior intracranial circulation. No large vessel occlusion or aneurysm. 2. 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 Multiple segments of mild stenosis.  CT head without contrast 10/19/2017 IMPRESSION: 1. Mild if any hypodensity has developed at the right thalamus correspond to the subtle diffusion abnormality suspected by MRI two days ago. 2. No acute cortically based infarct or other  intracranial abnormality identified. 3. ASPECTS is 10.  Cerebral angiogram 10/31/2017 IMPRESSION: Approximately 60-70% stenosis of the right middle cerebral artery M1 segment, and approximately 50-60% stenosis of the left middle cerebral artery M1 segment secondary to atherosclerotic disease. Severe focal stenosis of the right posterior-inferior cerebral artery in its proximal 1/3, and a moderately severe stenosis of the left posterior-inferior cerebral artery in its proximal 1/3. High-grade arteriosclerotic stenosis of the right posterior cerebral artery of the distal P2 segment. Moderate arteriosclerotic stenosis of the distal left posterior cerebral artery P1 segment.    ASSESSMENT: Garrett Green is a 72 y.o. year old male here with recent right thalamic infarct on 10/17/2017 with recurrent symptoms on 10/19/2017 secondary to small vessel disease. Vascular risk factors include pre-DM, HTN and HLD.  Patient returns today for hospital stroke follow-up and continues to have residual deficits of left paresthesia along with possible nerve pain and tension headaches.    PLAN: -Continue clopidogrel 75 mg daily  and Lipitor for secondary stroke prevention -Start gabapentin 300 mg twice daily for nerve pain associated with increased activity -advised if tolerating well after 2 weeks but continues to have nerve pain, will consider increasing dose at that time.  Advised patient that if unable to tolerate due to side effects, consider alternative medication -Recommended neck exercises along with stress reduction for headache management (instructions provided in AVS).  Advised to call if headaches continue despite above recommendations, consider starting Topamax for headache prevention -f/u with Dr. Estanislado Pandy as scheduled for repeat MRI imaging of carotid stenosis in 6 months -F/u with PCP regarding your HLD and HTN management -continue to monitor BP at home -Advised to continue to stay active as  tolerated and maintain a healthy diet -Maintain strict control of hypertension with blood pressure goal below 130/90, diabetes with hemoglobin A1c goal below 6.5% and cholesterol with LDL cholesterol (bad cholesterol) goal below 70 mg/dL. I also advised the patient to eat a healthy diet with plenty of whole grains, cereals, fruits and vegetables, exercise  regularly and maintain ideal body weight.  Follow up in 3 months or call earlier if needed   Greater than 50% of time during this 25 minute visit was spent on counseling,explanation of diagnosis of right thalamic infarct, reviewing risk factor management of HTN, HLD and pre-DM, planning of further management, discussion with patient and family and coordination of care    Venancio Poisson, [facility]  [facility] [address] [place], [place] [address]  Phone [phone] Fax [phone]

## 2017-12-01 ENCOUNTER — Encounter: Payer: Self-pay | Admitting: Family Medicine

## 2017-12-01 ENCOUNTER — Ambulatory Visit (INDEPENDENT_AMBULATORY_CARE_PROVIDER_SITE_OTHER): Payer: Medicare Other | Admitting: Family Medicine

## 2017-12-01 VITALS — BP 127/79 | HR 61 | Temp 97.7°F | Resp 16 | Ht 68.0 in | Wt 192.0 lb

## 2017-12-01 DIAGNOSIS — I6990 Unspecified sequelae of unspecified cerebrovascular disease: Secondary | ICD-10-CM

## 2017-12-01 DIAGNOSIS — Z0000 Encounter for general adult medical examination without abnormal findings: Secondary | ICD-10-CM | POA: Diagnosis not present

## 2017-12-01 DIAGNOSIS — G4700 Insomnia, unspecified: Secondary | ICD-10-CM | POA: Diagnosis not present

## 2017-12-01 DIAGNOSIS — I10 Essential (primary) hypertension: Secondary | ICD-10-CM

## 2017-12-01 DIAGNOSIS — H409 Unspecified glaucoma: Secondary | ICD-10-CM | POA: Diagnosis not present

## 2017-12-01 DIAGNOSIS — E785 Hyperlipidemia, unspecified: Secondary | ICD-10-CM | POA: Diagnosis not present

## 2017-12-01 DIAGNOSIS — Z125 Encounter for screening for malignant neoplasm of prostate: Secondary | ICD-10-CM

## 2017-12-01 DIAGNOSIS — I679 Cerebrovascular disease, unspecified: Secondary | ICD-10-CM

## 2017-12-01 MED ORDER — ZOLPIDEM TARTRATE 10 MG PO TABS: 5.0000 mg | ORAL_TABLET | Freq: Every evening | ORAL | 1 refills | Status: DC | PRN

## 2017-12-01 NOTE — Patient Instructions (Signed)
   The CDC recommends two doses of Shingrix (the shingles vaccine) separated by 2 to 6 months for adults age 70 years and older. This is recommend even if you have had the old Zostavax vaccine. I recommend checking with your pharmacy plan regarding coverage for this vaccine.

## 2017-12-01 NOTE — Progress Notes (Signed)
Patient: Garrett Green, Male    DOB: December 09, 1945, 72 y.o.   MRN: [id number] Visit Date: 12/01/2017  Today's Provider: Lelon Huh, MD   Chief Complaint  Patient presents with  . Medicare Wellness  . Hypertension   Subjective:    Annual wellness Garrett Green is a 72 y.o. male. He feels fairly well. He reports exercising none. He reports he is sleeping poorly.He has taken Ambien in the past which he states he tolerated well and did not make him groggy or confused. He requests prescription for Ambien today.   -----------------------------------------------------------   Hypertension, follow-up:  BP Readings from Last 3 Encounters:  12/01/17 127/79  11/24/17 116/68  10/31/17 115/72    He was last seen for hypertension 1 months ago.  BP at that visit was 122/74. Management since that visit includes; no changes.He reports good compliance with treatment. He is not having side effects. none He is not exercising. He is adherent to low salt diet.   Outside blood pressures are normal. . He is experiencing none.  Patient denies none.   Cardiovascular risk factors include advanced age (older than 22 for men, 41 for women).  Use of agents associated with hypertension: none.   ---------------------------------------------------------------   Right thalamic infarction [facility]) Is now about 6 weeks post CVA. Has finished one month ASA and continuing on clopidogrel per neurology recommendations. Is tolerating atorvastatin well, which was started during hospitalization in July for CVA. He still has some numbness in left arm, but doesn't have any weakness. Denies any unusual muscle aches, abdominal pains, abnormal bruising or bleeding.  He was noted to have elevated creatinine prior to recent angiogram and advised to have this rechecked.   Prediabetes  Lab Results  Component Value Date   HGBA1C 6.2 (H) 10/18/2017    Review of Systems  Constitutional: Negative for chills,  diaphoresis and fever.  HENT: Positive for sinus pressure. Negative for congestion, ear discharge, ear pain, hearing loss, nosebleeds, sore throat and tinnitus.   Eyes: Negative for photophobia, pain, discharge and redness.  Respiratory: Negative for cough, shortness of breath, wheezing and stridor.   Cardiovascular: Negative for chest pain, palpitations and leg swelling.  Gastrointestinal: Negative for abdominal pain, blood in stool, constipation, diarrhea, nausea and vomiting.  Endocrine: Negative for polydipsia.  Genitourinary: Negative for dysuria, flank pain, frequency, hematuria and urgency.  Musculoskeletal: Positive for back pain. Negative for myalgias and neck pain.  Skin: Negative for rash.  Allergic/Immunologic: Negative for environmental allergies.  Neurological: Positive for numbness. Negative for dizziness, tremors, seizures, weakness and headaches.  Hematological: Does not bruise/bleed easily.  Psychiatric/Behavioral: Positive for sleep disturbance. Negative for hallucinations and suicidal ideas. The patient is not nervous/anxious.     Social History   Socioeconomic History  . Marital status: Married    Spouse name: Not on file  . Number of children: Not on file  . Years of education: Not on file  . Highest education level: Not on file  Occupational History    Employer: RETIRED  Social Needs  . Financial resource strain: Not on file  . Food insecurity:    Worry: Not on file    Inability: Not on file  . Transportation needs:    Medical: Not on file    Non-medical: Not on file  Tobacco Use  . Smoking status: Former Smoker    Packs/day: 1.00    Years: 25.00    Pack years: 25.00    Last attempt  to quit: 03/03/1988    Years since quitting: 29.7  . Smokeless tobacco: Never Used  Substance and Sexual Activity  . Alcohol use: Yes    Alcohol/week: 1.0 standard drinks    Types: 1 Standard drinks or equivalent per week  . Drug use: Never  . Sexual activity: Yes    Lifestyle  . Physical activity:    Days per week: Not on file    Minutes per session: Not on file  . Stress: Not on file  Relationships  . Social connections:    Talks on phone: Not on file    Gets together: Not on file    Attends religious service: Not on file    Active member of club or organization: Not on file    Attends meetings of clubs or organizations: Not on file    Relationship status: Not on file  . Intimate partner violence:    Fear of current or ex partner: Not on file    Emotionally abused: Not on file    Physically abused: Not on file    Forced sexual activity: Not on file  Other Topics Concern  . Not on file  Social History Narrative  . Not on file    Past Medical History:  Diagnosis Date  . Arthritis    "lower back" (10/19/2017)  . GERD (gastroesophageal reflux disease)   . High cholesterol 10/19/2017  . History of blood transfusion 2014   "related to back OR"  . History of chicken pox   . Hypertension    stress test- 8-9 yrs. ago, wnl, [facility]  . Stroke ([place])   . TIA (transient ischemic attack) 10/17/2017; 10/19/2017     Patient Active Problem List   Diagnosis Date Noted  . Cerebral artery disease 11/07/2017  . Right thalamic infarction ([place])   . Mild concentric left ventricular hypertrophy (LVH) 10/19/2017  . Prediabetes 11/30/2016  . Spondylosis 10/22/2014  . Dysphagia 10/22/2014  . Insomnia 10/22/2014  . Fam hx-ischem heart disease 08/13/2007  . 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 Arthralgia of hip or thigh 09/26/2006  . Pain in hip 09/26/2006  . Allergic rhinitis 09/17/2001  . GERD (gastroesophageal reflux disease) 11/14/1994  . Essential (primary) hypertension 11/14/1994    Past Surgical History:  Procedure Laterality Date  . BACK SURGERY    . CATARACT EXTRACTION W/ INTRAOCULAR LENS IMPLANT Left   . COLONOSCOPY WITH PROPOFOL N/A 03/16/2016   Procedure: COLONOSCOPY WITH PROPOFOL;  Surgeon: Manya Silvas, MD;  Location: [facility] END[facility]COPY;   Service: Endoscopy;  Laterality: N/A;  . ESOPHAGOGASTRODUODENOSCOPY (EGD) WITH ESOPHAGEAL DILATION  1990s  . IR ANGIO INTRA EXTRACRAN SEL COM CAROTID INNOMINATE BILAT MOD SED  10/31/2017  . IR ANGIO VERTEBRAL SEL VERTEBRAL BILAT MOD SED  10/31/2017  . LUMBAR LAMINECTOMY/DECOMPRESSION MICRODISCECTOMY Left 07/26/2012   Procedure: LUMBAR LAMINECTOMY/DECOMPRESSION MICRODISCECTOMY 1 LEVEL;  Surgeon: Eustace Moore, MD;  Location: [facility];  Service: Neurosurgery;  Laterality: Left;  lumbar five sacral one  . SHOULDER ARTHROSCOPY WITH OPEN ROTATOR CUFF REPAIR Left 1990s  . TONSILLECTOMY      His family history includes Cancer in his sister; Dementia in his brother and sister; Diabetes in his brother and sister; Heart disease in his mother.      Current Outpatient Medications:  .  atorvastatin (LIPITOR) 40 MG tablet, Take 1 tablet (40 mg total) by mouth daily at 6 PM., Disp: 30 tablet, Rfl: 2 .  Biotin 10000 MCG TABS, Take 1 tablet by mouth daily.,  Disp: , Rfl:  .  brimonidine (ALPHAGAN P) 0.1 % SOLN, Place 1 drop into the left eye 2 (two) times daily., Disp: , Rfl:  .  brimonidine (ALPHAGAN) 0.15 % ophthalmic solution, , Disp: , Rfl:  .  clopidogrel (PLAVIX) 75 MG tablet, Take 1 tablet (75 mg total) by mouth daily., Disp: 30 tablet, Rfl: 2 .  fluticasone (FLONASE) 50 MCG/ACT nasal spray, Place 2 sprays into both nostrils daily as needed for allergies., Disp: 48 g, Rfl: 1 .  gabapentin (NEURONTIN) 300 MG capsule, Take 1 capsule (300 mg total) by mouth 2 (two) times daily., Disp: 60 capsule, Rfl: 3 .  HYDROcodone-acetaminophen (NORCO/VICODIN) 5-325 MG tablet, Take 1 tablet by mouth every 6 (six) hours as needed., Disp: 120 tablet, Rfl: 0 .  latanoprost (XALATAN) 0.005 % ophthalmic solution, Place 1 drop into both eyes at bedtime., Disp: , Rfl:  .  loratadine (CLARITIN) 10 MG tablet, Take 10 mg by mouth daily as needed for allergies., Disp: , Rfl:  .  LORazepam (ATIVAN) 1 MG tablet, Take 1 tablet (1  mg total) by mouth 3 (three) times daily as needed. (Patient taking differently: Take 1 mg by mouth as directed. Only for flying), Disp: 10 tablet, Rfl: 1 .  ramipril (ALTACE) 10 MG capsule, TAKE 1 CAPSULE BY MOUTH  DAILY, Disp: 90 capsule, Rfl: 4 .  Aspirin-Calcium Carbonate 81-777 MG TABS, Take by mouth., Disp: , Rfl:  .  Omega-3 Fatty Acids (FISH OIL) 1200 MG CAPS, Take by mouth., Disp: , Rfl:   Patient Care Team: Birdie Sons, MD as PCP - General (Family Medicine) Oh, Lupita Dawn, MD (Inactive) as Consulting Physician (Gastroenterology) Jalene Mullet, MD as Consulting Physician (Ophthalmology)     Objective:   Vitals: BP 127/79 (BP Location: Right Arm, Patient Position: Sitting, Cuff Size: Large)   Pulse 61   Temp 97.7 F (36.5 C) (Oral)   Resp 16   Ht 5\' 8"  (1.727 m)   Wt 192 lb (87.1 kg)   SpO2 98%   BMI 29.19 kg/m   Physical Exam   General Appearance:    Alert, cooperative, no distress  Eyes:    PERRL, conjunctiva/corneas clear, EOM's intact       Lungs:     Clear to auscultation bilaterally, respirations unlabored  Heart:    Regular rate and rhythm  Neurologic:   Awake, alert, oriented x 3. No apparent focal neurological           defect.        Activities of Daily Living In your present state of health, do you have any difficulty performing the following activities: 12/01/2017 10/31/2017  Hearing? N N  Vision? Y N  Difficulty concentrating or making decisions? N N  Walking or climbing stairs? N N  Dressing or bathing? N N  Doing errands, shopping? N -  Some recent data might be hidden    Fall Risk Assessment Fall Risk  12/01/2017 11/24/2017 11/30/2016 11/26/2015 10/28/2015  Falls in the past year? No No No No No     Depression Screen PHQ 2/9 Scores 12/01/2017 11/30/2016 11/26/2015 10/28/2015  PHQ - 2 Score 0 0 0 0  PHQ- 9 Score 2 2 0 0    Cognitive Testing - 6-CIT Patient Declined Test.  Audit-C Alcohol Use Screening   Alcohol Use Disorder Test (AUDIT)  10/27/2017 12/01/2017  1. How often do you have a drink containing alcohol? 2 1  2. How many drinks containing alcohol do you have  on a typical day when you are drinking? 0 1  3. How often do you have six or more drinks on one occasion? 0 0  AUDIT-C Score 2 2    A score of 3 or more in women, and 4 or more in men indicates increased risk for alcohol abuse, EXCEPT if all of the points are from question 1    Assessment & Plan:     Annual Wellness Visit  Reviewed patient's Family Medical History Reviewed and updated list of patient's medical providers Assessment of cognitive impairment was done Assessed patient's functional ability Established a written schedule for health screening [place] Completed and Reviewed  Exercise Activities and Dietary recommendations Goals   None     Immunization History  Administered Date(s) Administered  . Influenza, High Dose Seasonal PF 11/30/2016  . Pneumococcal Conjugate-13 10/23/2013  . Pneumococcal Polysaccharide-23 10/27/2014  . Td 11/26/2015  . Tdap 06/20/2005  . Zoster 09/02/2009    Health Maintenance  Topic Date Due  . INFLUENZA VACCINE  11/09/2017  . TETANUS/TDAP  11/25/2025  . COLONOSCOPY  03/16/2026  . Hepatitis C Screening  Completed  . PNA vac Low Risk Adult  Completed     Discussed health benefits of physical activity, and encouraged him to engage in regular exercise appropriate for his age and condition.    ------------------------------------------------------------------------------------------------------------  2. Prostate cancer screening  - PSA  3. Insomnia, unspecified type start- zolpidem (AMBIEN) 10 MG tablet; Take 0.5-1 tablets (5-10 mg total) by mouth at bedtime as needed for sleep.  Dispense: 15 tablet; Refill: 1. Counseled on potential adverse effects.  4. Essential (primary) hypertension Well controlled.  Continue current medications.    5. Glaucoma of left eye, unspecified  glaucoma type Stable continue routine follow up with ophthalmology.   6. Hyperlipidemia, unspecified hyperlipidemia type He is tolerating initiation of atorvastatin well with no adverse effects.   - Comprehensive metabolic panel - Lipid panel - CBC  7. Late effects of CVA (cerebrovascular accident) Slowly improving.   8. Cerebral artery disease Continue routine neurology follow up, clopidogrel and strict BP and cholesterol management.   Lelon Huh, MD  [facility]

## 2017-12-01 NOTE — Progress Notes (Deleted)
Patient: Garrett Green, Male    DOB: 1946/02/01, 72 y.o.   MRN: [id number] Visit Date: 12/01/2017  Today's Provider: Lelon Huh, MD   No chief complaint on file.  Subjective:     Complete Physical Garrett Green is a 72 y.o. male. He feels {DESC; WELL/FAIRLY WELL/POORLY:18703}. He reports exercising ***. He reports he is sleeping {DESC; WELL/FAIRLY WELL/POORLY:18703}.  -----------------------------------------------------------   Review of Systems  Social History   Socioeconomic History  . Marital status: Married    Spouse name: Not on file  . Number of children: Not on file  . Years of education: Not on file  . Highest education level: Not on file  Occupational History    Employer: RETIRED  Social Needs  . Financial resource strain: Not on file  . Food insecurity:    Worry: Not on file    Inability: Not on file  . Transportation needs:    Medical: Not on file    Non-medical: Not on file  Tobacco Use  . Smoking status: Former Smoker    Packs/day: 1.00    Years: 25.00    Pack years: 25.00    Last attempt to quit: 03/03/1988    Years since quitting: 29.7  . Smokeless tobacco: Never Used  Substance and Sexual Activity  . Alcohol use: Yes    Alcohol/week: 1.0 standard drinks    Types: 1 Standard drinks or equivalent per week  . Drug use: Never  . Sexual activity: Yes  Lifestyle  . Physical activity:    Days per week: Not on file    Minutes per session: Not on file  . Stress: Not on file  Relationships  . Social connections:    Talks on phone: Not on file    Gets together: Not on file    Attends religious service: Not on file    Active member of club or organization: Not on file    Attends meetings of clubs or organizations: Not on file    Relationship status: Not on file  . Intimate partner violence:    Fear of current or ex partner: Not on file    Emotionally abused: Not on file    Physically abused: Not on file    Forced sexual  activity: Not on file  Other Topics Concern  . Not on file  Social History Narrative  . Not on file    Past Medical History:  Diagnosis Date  . Arthritis    "lower back" (10/19/2017)  . GERD (gastroesophageal reflux disease)   . High cholesterol 10/19/2017  . History of blood transfusion 2014   "related to back OR"  . History of chicken pox   . Hypertension    stress test- 8-9 yrs. ago, wnl, [facility]  . Stroke ([place])   . TIA (transient ischemic attack) 10/17/2017; 10/19/2017     Patient Active Problem List   Diagnosis Date Noted  . Cerebral artery disease 11/07/2017  . Right thalamic infarction ([place])   . Mild concentric left ventricular hypertrophy (LVH) 10/19/2017  . Prediabetes 11/30/2016  . Spondylosis 10/22/2014  . Dysphagia 10/22/2014  . Insomnia 10/22/2014  . Fam hx-ischem heart disease 08/13/2007  . 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 Arthralgia of hip or thigh 09/26/2006  . Pain in hip 09/26/2006  . Allergic rhinitis 09/17/2001  . GERD (gastroesophageal reflux disease) 11/14/1994  . Essential (primary) hypertension 11/14/1994    Past Surgical History:  Procedure Laterality Date  . BACK  SURGERY    . CATARACT EXTRACTION W/ INTRAOCULAR LENS IMPLANT Left   . COLONOSCOPY WITH PROPOFOL N/A 03/16/2016   Procedure: COLONOSCOPY WITH PROPOFOL;  Surgeon: Manya Silvas, MD;  Location: [facility] ENDOSCOPY;  Service: Endoscopy;  Laterality: N/A;  . ESOPHAGOGASTRODUODENOSCOPY (EGD) WITH ESOPHAGEAL DILATION  1990s  . IR ANGIO INTRA EXTRACRAN SEL COM CAROTID INNOMINATE BILAT MOD SED  10/31/2017  . IR ANGIO VERTEBRAL SEL VERTEBRAL BILAT MOD SED  10/31/2017  . LUMBAR LAMINECTOMY/DECOMPRESSION MICRODISCECTOMY Left 07/26/2012   Procedure: LUMBAR LAMINECTOMY/DECOMPRESSION MICRODISCECTOMY 1 LEVEL;  Surgeon: Eustace Moore, MD;  Location: [facility];  Service: Neurosurgery;  Laterality: Left;  lumbar five sacral one  . SHOULDER ARTHROSCOPY WITH OPEN ROTATOR CUFF REPAIR Left 1990s  . TONSILLECTOMY        His family history includes Cancer in his sister; Dementia in his brother and sister; Diabetes in his brother and sister; Heart disease in his mother.      Current Outpatient Medications:  .  Aspirin-Calcium Carbonate 81-777 MG TABS, Take by mouth., Disp: , Rfl:  .  atorvastatin (LIPITOR) 40 MG tablet, Take 1 tablet (40 mg total) by mouth daily at 6 PM., Disp: 30 tablet, Rfl: 2 .  Biotin 10000 MCG TABS, Take 1 tablet by mouth daily., Disp: , Rfl:  .  brimonidine (ALPHAGAN P) 0.1 % SOLN, Place 1 drop into the left eye 2 (two) times daily., Disp: , Rfl:  .  brimonidine (ALPHAGAN) 0.15 % ophthalmic solution, , Disp: , Rfl:  .  clopidogrel (PLAVIX) 75 MG tablet, Take 1 tablet (75 mg total) by mouth daily., Disp: 30 tablet, Rfl: 2 .  fluticasone (FLONASE) 50 MCG/ACT nasal spray, Place 2 sprays into both nostrils daily as needed for allergies., Disp: 48 g, Rfl: 1 .  gabapentin (NEURONTIN) 300 MG capsule, Take 1 capsule (300 mg total) by mouth 2 (two) times daily., Disp: 60 capsule, Rfl: 3 .  HYDROcodone-acetaminophen (NORCO/VICODIN) 5-325 MG tablet, Take 1 tablet by mouth every 6 (six) hours as needed., Disp: 120 tablet, Rfl: 0 .  latanoprost (XALATAN) 0.005 % ophthalmic solution, Place 1 drop into both eyes at bedtime., Disp: , Rfl:  .  loratadine (CLARITIN) 10 MG tablet, Take 10 mg by mouth daily as needed for allergies., Disp: , Rfl:  .  LORazepam (ATIVAN) 1 MG tablet, Take 1 tablet (1 mg total) by mouth 3 (three) times daily as needed. (Patient taking differently: Take 1 mg by mouth as directed. ), Disp: 10 tablet, Rfl: 1 .  Omega-3 Fatty Acids (FISH OIL) 1200 MG CAPS, Take by mouth., Disp: , Rfl:  .  ramipril (ALTACE) 10 MG capsule, TAKE 1 CAPSULE BY MOUTH  DAILY, Disp: 90 capsule, Rfl: 4  Patient Care Team: Birdie Sons, MD as PCP - General (Family Medicine) Oh, Lupita Dawn, MD (Inactive) as Consulting Physician (Gastroenterology)     Objective:   Vitals: There were no vitals taken for  this visit.  Physical Exam  Activities of Daily Living In your present state of health, do you have any difficulty performing the following activities: 10/31/2017 10/19/2017  Hearing? N N  Vision? N N  Difficulty concentrating or making decisions? N N  Walking or climbing stairs? N N  Dressing or bathing? N N  Doing errands, shopping? - N  Some recent data might be hidden    Fall Risk Assessment Fall Risk  11/24/2017 11/30/2016 11/26/2015 10/28/2015 10/27/2014  Falls in the past year? No No No No No  Depression Screen PHQ 2/9 Scores 11/30/2016 11/26/2015 10/28/2015 10/27/2014  PHQ - 2 Score 0 0 0 0  PHQ- 9 Score 2 0 0 -         Assessment & Plan:    Annual Physical Reviewed patient's Family Medical History Reviewed and updated list of patient's medical providers Assessment of cognitive impairment was done Assessed patient's functional ability Established a written schedule for health screening [place] Completed and Reviewed  Exercise Activities and Dietary recommendations Goals   None     Immunization History  Administered Date(s) Administered  . Influenza, High Dose Seasonal PF 11/30/2016  . Pneumococcal Conjugate-13 10/23/2013  . Pneumococcal Polysaccharide-23 10/27/2014  . Td 11/26/2015  . Tdap 06/20/2005  . Zoster 09/02/2009    Health Maintenance  Topic Date Due  . INFLUENZA VACCINE  11/09/2017  . TETANUS/TDAP  11/25/2025  . COLONOSCOPY  03/16/2026  . Hepatitis C Screening  Completed  . PNA vac Low Risk Adult  Completed     Discussed health benefits of physical activity, and encouraged him to engage in regular exercise appropriate for his age and condition.    ------------------------------------------------------------------------------------------------------------    Lelon Huh, MD  [place]

## 2017-12-02 LAB — COMPREHENSIVE METABOLIC PANEL
ALT: 23 IU/L (ref 0–44)
AST: 21 IU/L (ref 0–40)
Albumin/Globulin Ratio: 2.1 (ref 1.2–2.2)
Albumin: 4.6 g/dL (ref 3.6–4.8)
Alkaline Phosphatase: 67 IU/L (ref 39–117)
BUN/Creatinine Ratio: 21 (ref 10–24)
BUN: 20 mg/dL (ref 8–27)
Bilirubin Total: 0.6 mg/dL (ref 0.0–1.2)
CO2: 17 mmol/L — ABNORMAL LOW (ref 20–29)
Calcium: 9.7 mg/dL (ref 8.6–10.2)
Chloride: 105 mmol/L (ref 96–106)
Creatinine, Ser: 0.96 mg/dL (ref 0.76–1.27)
GFR calc Af Amer: 93 mL/min/{1.73_m2} (ref >59)
GFR calc non Af Amer: 80 mL/min/{1.73_m2} (ref >59)
Globulin, Total: 2.2 g/dL (ref 1.5–4.5)
Glucose: 113 mg/dL — ABNORMAL HIGH (ref 65–99)
Potassium: 5 mmol/L (ref 3.5–5.2)
Sodium: 140 mmol/L (ref 134–144)
Total Protein: 6.8 g/dL (ref 6.0–8.5)

## 2017-12-02 LAB — LIPID PANEL
Chol/HDL Ratio: 2.4 ratio (ref 0.0–5.0)
Cholesterol, Total: 99 mg/dL — ABNORMAL LOW (ref 100–199)
HDL: 41 mg/dL (ref >39)
LDL Calculated: 40 mg/dL (ref 0–99)
Triglycerides: 88 mg/dL (ref 0–149)
VLDL Cholesterol Cal: 18 mg/dL (ref 5–40)

## 2017-12-02 LAB — PSA: Prostate Specific Ag, Serum: 0.6 ng/mL (ref 0.0–4.0)

## 2017-12-02 LAB — CBC
Hematocrit: 41.4 % (ref 37.5–51.0)
Hemoglobin: 14.2 g/dL (ref 13.0–17.7)
MCH: 31 pg (ref 26.6–33.0)
MCHC: 34.3 g/dL (ref 31.5–35.7)
MCV: 90 fL (ref 79–97)
Platelets: 204 10*3/uL (ref 150–450)
RBC: 4.58 x10E6/uL (ref 4.14–5.80)
RDW: 13.2 % (ref 12.3–15.4)
WBC: 7.8 10*3/uL (ref 3.4–10.8)

## 2017-12-04 ENCOUNTER — Telehealth: Payer: Self-pay

## 2017-12-04 NOTE — Telephone Encounter (Signed)
-----   Message from Birdie Sons, MD sent at 12/03/2017  9:17 PM EDT ----- Labs good, kidney functions back to normal. Cholesterol is down to 99 which is very good. Continue current medications.

## 2017-12-04 NOTE — Telephone Encounter (Signed)
Pt advised.   Thanks,   -Murrell Dome  

## 2017-12-07 ENCOUNTER — Telehealth: Payer: Self-pay | Admitting: Family Medicine

## 2017-12-07 NOTE — Telephone Encounter (Signed)
Patient stated he is going to pay out-of-pocket for Ambien.

## 2017-12-07 NOTE — Telephone Encounter (Signed)
LMOVM for pt to return call 

## 2017-12-07 NOTE — Telephone Encounter (Signed)
Please advise patient that his insurance will not cover Ambien without first trying Belsomra AND rozerem. He can either pay out of pocket for Ambien, or try rozerem 8mg  [facility], #30 rf x 1.

## 2017-12-16 ENCOUNTER — Other Ambulatory Visit: Payer: Self-pay | Admitting: Adult Health

## 2017-12-18 ENCOUNTER — Other Ambulatory Visit: Payer: Self-pay

## 2017-12-18 MED ORDER — GABAPENTIN 300 MG PO CAPS: 300.0000 mg | ORAL_CAPSULE | Freq: Two times a day (BID) | ORAL | 0 refills | Status: DC

## 2018-01-08 ENCOUNTER — Telehealth: Payer: Self-pay | Admitting: Family Medicine

## 2018-01-08 DIAGNOSIS — G4700 Insomnia, unspecified: Secondary | ICD-10-CM

## 2018-01-08 MED ORDER — ZOLPIDEM TARTRATE 10 MG PO TABS: 5.0000 mg | ORAL_TABLET | Freq: Every evening | ORAL | 5 refills | Status: DC | PRN

## 2018-01-08 NOTE — Telephone Encounter (Signed)
Please advise rx? 

## 2018-01-08 NOTE — Telephone Encounter (Signed)
Needs a refill on his   Zolpidem 10 mg    They want a HARD COPY of the rx.  They want to take it to the pharmacy.  CB#  [phone]  Thanks Con Memos

## 2018-01-15 ENCOUNTER — Other Ambulatory Visit: Payer: Self-pay | Admitting: Family Medicine

## 2018-01-15 MED ORDER — CLOPIDOGREL BISULFATE 75 MG PO TABS: 75.0000 mg | ORAL_TABLET | Freq: Every day | ORAL | 5 refills | Status: DC

## 2018-01-15 MED ORDER — ATORVASTATIN CALCIUM 40 MG PO TABS: 40.0000 mg | ORAL_TABLET | Freq: Every day | ORAL | 5 refills | Status: DC

## 2018-01-15 NOTE — Telephone Encounter (Signed)
Pt needing refills:   clopidogrel (PLAVIX) 75 MG tablet atorvastatin (LIPITOR) 40 MG tablet  Pt is out.  Please fill asap.  Pharmacy said they sent in the request with no response.  Please fill at: [id number] [place], [place] [phone] (Phone) [phone] (Fax)    Thanks, [employer]

## 2018-01-24 ENCOUNTER — Other Ambulatory Visit: Payer: Self-pay | Admitting: Family Medicine

## 2018-01-24 DIAGNOSIS — M47816 Spondylosis without myelopathy or radiculopathy, lumbar region: Secondary | ICD-10-CM

## 2018-01-24 NOTE — Telephone Encounter (Signed)
Needs refill on his   Hydrocodone 5-325  [facility] [place]  Thanks teri

## 2018-01-25 MED ORDER — HYDROCODONE-ACETAMINOPHEN 5-325 MG PO TABS: 1.0000 | ORAL_TABLET | Freq: Four times a day (QID) | ORAL | 0 refills | Status: DC | PRN

## 2018-01-29 ENCOUNTER — Telehealth: Payer: Self-pay | Admitting: Adult Health

## 2018-01-29 NOTE — Telephone Encounter (Signed)
Pt wife(on [facility],Faye [phone]) has called for pt.  Pt is asking for a call from NP Janett Billow to discuss complications he is having.  Wife states she is unsure if the complications are from stroke or medications, please call

## 2018-01-29 NOTE — Telephone Encounter (Signed)
Rn call patient about his left side. Pt stated Janett Billow NP told him after 3 months the numbness and tightness after the stroke should be getting better. PT stated when he wakes up in the morning he is feeling okay with his left side is okay and not tight.. When he starts doing more activity like picking up things, or using his chainsaw he can feel his shoulder to his side and leg on left side getting more tighter.Also when he walks more he feels the tightness and muscles getting numb. This has been happening more in the last couple of weeks. Rn ask pt if taking the gabapentin one pill twice a day. Pt stated he started taking the pill daily to see if that was the cause. Rn recommend he not decrease medication without consulting with Janett Billow NP who prescribed it. Pt stated he heard statins can cause this.r Rn recommend he start taking the gabapentin twice daily until Janett Billow reviews his chart. PT verbalized understanding and will wait for recommendations from [facility] NP.

## 2018-01-30 NOTE — Telephone Encounter (Signed)
PT verbalized understanding. 

## 2018-01-30 NOTE — Telephone Encounter (Signed)
Please advise patient that the symptoms are most likely continued deficits from his stroke.  Please remind him that typically after stroke he will have greatest improvement within the first 3 months but continued improvement can also occur up to 6 months and may be even somewhat to a year.  This information was discussed during appointment along with educating them that it is difficult to tell whether the symptoms will completely resolve or not.  Gabapentin was recommended as he is complaining of a burning radiating pain going up his left arm, into his shoulder and up into his head giving him headaches.  Please advised him to take the gabapentin 300 mg in the morning and at night.  If he experiences too much fatigue after the morning dose, please advised him to let us know and we can increase nighttime dose.  If he is able to tolerate the morning and evening dose but continues to experience nerve pain, we can increase dose at that time.  Unfortunately the gabapentin most likely will not stop the numbness/tingling but is more to assist with the pain associated up into his neck and head.  Please advise him to take this dose for the next 2 weeks and to call office to let us know how he is doing.  If he continues to experience the increased numbness/tingling and tightness with more activity, advised him to do activity in shorter intervals with rest breaks in between to see if this help prevents his arm and leg from side to feel that tightness with increased numbness and tingling.

## 2018-01-30 NOTE — Telephone Encounter (Signed)
Rn spoke with patient about Janett Billow NP recommendations. Rn gave him Janett Billow NP message below in detail. Pt will take 300mg  in the am and 300mg  at night. He was also advise that to take rest breaks in between activities.RN stated Janett Billow Np also recommend calling in two weeks to see if she needs to increase the night time dosage.

## 2018-02-23 ENCOUNTER — Other Ambulatory Visit: Payer: Self-pay | Admitting: Family Medicine

## 2018-02-26 ENCOUNTER — Ambulatory Visit (INDEPENDENT_AMBULATORY_CARE_PROVIDER_SITE_OTHER): Payer: Medicare Other | Admitting: Adult Health

## 2018-02-26 ENCOUNTER — Encounter: Payer: Self-pay | Admitting: Adult Health

## 2018-02-26 VITALS — BP 121/78 | HR 85 | Wt 182.2 lb

## 2018-02-26 DIAGNOSIS — I10 Essential (primary) hypertension: Secondary | ICD-10-CM

## 2018-02-26 DIAGNOSIS — E785 Hyperlipidemia, unspecified: Secondary | ICD-10-CM

## 2018-02-26 DIAGNOSIS — I639 Cerebral infarction, unspecified: Secondary | ICD-10-CM | POA: Diagnosis not present

## 2018-02-26 DIAGNOSIS — R200 Anesthesia of skin: Secondary | ICD-10-CM | POA: Diagnosis not present

## 2018-02-26 DIAGNOSIS — R202 Paresthesia of skin: Secondary | ICD-10-CM

## 2018-02-26 DIAGNOSIS — I6381 Other cerebral infarction due to occlusion or stenosis of small artery: Secondary | ICD-10-CM

## 2018-02-26 NOTE — Progress Notes (Signed)
[facility] [address] [place]. [place] [address] [phone]       OFFICE FOLLOW UP NOTE  Mr. NICKALAS MCCARRICK Date of Birth:  1946/03/14 Medical Record Number:  [id number]   Reason for Referral:  hospital stroke follow up  CHIEF COMPLAINT:  Chief Complaint  Patient presents with  . 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 History obtained from patient, wife and chart review. Reviewed all radiology images and labs personally.  Mr. KAREN KINNARD is a 72 y.o. male with history of R thalamic stroke 2 days ago at [facility], HTN, HLD and arthritis  who presented to [facility] with the same symptoms of L sided numbness that have recurred.   CT head reviewed and showed mild hypodensity which had developed around the right thalamus corresponds to subtle diffusion abnormality suspected by MRI that was performed 2 days prior but no other acute abnormality identified.  MRI head reviewed that was performed at [facility] showed acute/early subacute infarction within the right ventral lateral thalamus.  MRA head reviewed from [facility] that showed patent anterior and posterior intracranial circulation without large vessel occlusion or aneurysm but did show severe right distal M1 and severe distal right P2 stenosis with multiple segments of mild stenosis.  2D echo was within normal limits at [facility] with a EF of 60 to 65%.  It was recommended for aspirin and Plavix for 1 month and then Plavix alone.  LDL 104 and recommended to continue Lipitor 40 mg.  A1c satisfactory at 6.2.  At [facility], it was recommended for patient to continue DAPT for 3 weeks and then aspirin alone along with aggressive risk factor modification.  Patient was discharged  home in stable condition without needed therapies. Patient underwent cerebral angiogram on 10/31/2017 which showed approximately 60 to 70% stenosis of the right MCA M1 segment and approximately 50 to 60% stenosis of the left MCA M1 segment secondary to atherosclerotic disease, severe focal stenosis of the right posterior inferior cerebral artery and its proximal one third and moderately severe stenosis of the left posterior inferior cerebral artery and its proximal one third, high-grade arthroscopic stenosis of the right posterior cerebral artery in the distal P2 segment and moderate atherosclerotic stenosis of the distal left posterior cerebral artery P1 segment.  It was determined that as patient was stable neurologically to continue with conservative management with his current antiplatelet regimen.  Recommended follow-up MRI of the brain and MRA of the brain in 6 months.  11/24/2017 visit: Patient is being seen today for hospital stroke follow-up and is accompanied by his wife.  He continues to have left-sided paresthesia.  He states his leg continues with a numb sensation but his left arm up to his shoulder along with left abdominal and rib area continues to have a "weird sensation".  He states when he moves his arm he gets a tightness sensation.  He also states with increased activity he begins to have shooting pains that go up into his neck and results in a headache.  At times these headaches can be debilitating where he needs to lay down.  Denies migraine associated symptoms such as photophobia, phonophobia or nausea/vomiting.  Denies any history of headaches  in the past or history of migraines.  Denies any visual changes with these headaches or any other neurological symptom.  Patient completed 3-week DAPT with aspirin and Plavix and continues on Plavix alone stating he was told by Dr. Leonie Man to stop aspirin after 3 weeks.  He denies bleeding or bruising with the use of Plavix.  He continues Lipitor without  side effects of myalgias.  Blood pressure satisfactory at 116/68 and they do monitor this at home.  He has returned to doing all previous activities but is concerned regarding increase of pain in left arm with activity and residual headache.  Patient states he has drastically changed his diet to assist with reducing cholesterol levels, A1c level and avoids sodium intake.  Denies new or worsening stroke/TIA symptoms.  Interval history 02/26/2018: Patient is being seen today for follow-up visit and is accompanied by his wife.  After prior visit, is recommended to start gabapentin 300 mg twice daily for post stroke paresthesia.  He feels as though he has been experiencing worsening left-sided paresthesia since starting gabapentin.  He is unsure if this could be possibly due to use of gabapentin or greater concern is if his stroke had worsened.  He states that especially with increased activity, he will have greater numbness/tingling along with a heaviness and tightness sensation.  He has been experiencing this in a greater intensity than he was prior along with the sensation being greater in his left-sided rib area.  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  He has continued on Plavix without side effects of bleeding or bruising.  Continues on atorvastatin without side effects myalgias.  Blood pressure today satisfactory at 121/78.  Long discussion regarding post stroke paresthesia and difficulty treating symptoms associated with thalamic infarct.  No further concerns at this time.     ROS:   14 system review of systems performed and negative with exception of numbness  PMH:  Past Medical History:  Diagnosis Date  . Arthritis    "lower back" (10/19/2017)  . GERD (gastroesophageal reflux disease)   . High cholesterol 10/19/2017  . History of blood transfusion 2014   "related to back OR"  . History of chicken pox   . Hypertension    stress test- 8-9 yrs. ago, [facility], [facility]  .  Stroke ([facility])   . TIA (transient ischemic attack) 10/17/2017; 10/19/2017    PSH:  Past Surgical History:  Procedure Laterality Date  . BACK SURGERY    . CATARACT EXTRACTION W/ INTRAOCULAR LENS IMPLANT Left   . COLONOSCOPY WITH PROPOFOL N/A 03/16/2016   Procedure: COLONOSCOPY WITH PROPOFOL;  Surgeon: Manya Silvas, MD;  Location: [facility] ENDOSCOPY;  Service: Endoscopy;  Laterality: N/A;  . ESOPHAGOGASTRODUODENOSCOPY (EGD) WITH ESOPHAGEAL DILATION  1990s  . IR ANGIO INTRA EXTRACRAN SEL COM CAROTID INNOMINATE BILAT MOD SED  10/31/2017  . IR ANGIO VERTEBRAL SEL VERTEBRAL BILAT MOD SED  10/31/2017  . LUMBAR LAMINECTOMY/DECOMPRESSION MICRODISCECTOMY Left 07/26/2012   Procedure: LUMBAR LAMINECTOMY/DECOMPRESSION MICRODISCECTOMY 1 LEVEL;  Surgeon: Eustace Moore, MD;  Location: [place] NEURO ORS;  Service: Neurosurgery;  Laterality: Left;  lumbar five sacral one  . SHOULDER ARTHROSCOPY WITH OPEN ROTATOR CUFF REPAIR Left 1990s  . TONSILLECTOMY      Social History:  Social History   Socioeconomic History  . Marital status: Married    Spouse name: Not on file  . Number of children: Not on file  . Years of education: Not on file  .  Highest education level: Not on file  Occupational History    Employer: RETIRED  Social Needs  . Financial resource strain: Not on file  . Food insecurity:    Worry: Not on file    Inability: Not on file  . Transportation needs:    Medical: Not on file    Non-medical: Not on file  Tobacco Use  . Smoking status: Former Smoker    Packs/day: 1.00    Years: 25.00    Pack years: 25.00    Last attempt to quit: 03/03/1988    Years since quitting: 30.0  . Smokeless tobacco: Never Used  Substance and Sexual Activity  . Alcohol use: Yes    Alcohol/week: 1.0 standard drinks    Types: 1 Standard drinks or equivalent per week  . Drug use: Never  . Sexual activity: Yes  Lifestyle  . Physical activity:    Days per week: Not on file    Minutes per session: Not on file  .  Stress: Not on file  Relationships  . Social connections:    Talks on phone: Not on file    Gets together: Not on file    Attends religious service: Not on file    Active member of club or organization: Not on file    Attends meetings of clubs or organizations: Not on file    Relationship status: Not on file  . Intimate partner violence:    Fear of current or ex partner: Not on file    Emotionally abused: Not on file    Physically abused: Not on file    Forced sexual activity: Not on file  Other Topics Concern  . Not on file  Social History Narrative  . Not on file    Family History:  Family History  Problem Relation Age of Onset  . Heart disease Mother   . Cancer Sister   . Dementia Brother   . Dementia Sister   . Diabetes Brother   . Diabetes Sister     Medications:   Current Outpatient Medications on File Prior to Visit  Medication Sig Dispense Refill  . Aspirin-Calcium Carbonate 81-777 MG TABS Take by mouth.    Marland Kitchen atorvastatin (LIPITOR) 40 MG tablet Take 1 tablet (40 mg total) by mouth daily at 6 PM. 30 tablet 5  . Biotin 10000 MCG TABS Take 1 tablet by mouth daily.    . brimonidine (ALPHAGAN P) 0.1 % SOLN Place 1 drop into the left eye 2 (two) times daily.    . brimonidine (ALPHAGAN) 0.15 % ophthalmic solution     . clopidogrel (PLAVIX) 75 MG tablet Take 1 tablet (75 mg total) by mouth daily. 30 tablet 5  . fluticasone (FLONASE) 50 MCG/ACT nasal spray Place 2 sprays into both nostrils daily as needed for allergies. 48 g 1  . HYDROcodone-acetaminophen (NORCO/VICODIN) 5-325 MG tablet Take 1 tablet by mouth every 6 (six) hours as needed. 120 tablet 0  . latanoprost (XALATAN) 0.005 % ophthalmic solution Place 1 drop into both eyes at bedtime.    Marland Kitchen loratadine (CLARITIN) 10 MG tablet Take 10 mg by mouth daily as needed for allergies.    Marland Kitchen LORazepam (ATIVAN) 1 MG tablet Take 1 tablet (1 mg total) by mouth 3 (three) times daily as needed. (Patient taking differently: Take 1 mg  by mouth as directed. Only for flying) 10 tablet 1  . ramipril (ALTACE) 10 MG capsule TAKE 1 CAPSULE BY MOUTH  DAILY 90 capsule 1  .  zolpidem (AMBIEN) 10 MG tablet Take 0.5-1 tablets (5-10 mg total) by mouth at bedtime as needed for sleep. 15 tablet 5   No current facility-administered medications on file prior to visit.     Allergies:   Allergies  Allergen Reactions  . Sulfa Antibiotics Other (See Comments)    Lethargic and hypotension     Physical Exam  Vitals:   02/26/18 1045  BP: 121/78  Pulse: 85  Weight: 182 lb 3.2 oz (82.6 kg)   Body mass index is 27.7 kg/m. No exam data present  General: well developed, well nourished, pleasant middle-aged Caucasian male, seated, in no evident distress Head: head normocephalic and atraumatic.   Neck: supple with no carotid or supraclavicular bruits Cardiovascular: regular rate and rhythm, no murmurs Musculoskeletal: no deformity Skin:  no rash/petichiae Vascular:  Normal pulses all extremities  Neurologic Exam Mental Status: Awake and fully alert. Oriented to place and time. Recent and remote memory intact. Attention span, concentration and fund of knowledge appropriate. Mood and affect appropriate.  No flowsheet data found. Cranial Nerves: Fundoscopic exam deferred. Pupils equal, briskly reactive to light. Extraocular movements full without nystagmus. Visual fields full to confrontation. Hearing intact. Facial sensation intact. Face, tongue, palate moves normally and symmetrically.  Motor: Normal bulk and tone. Normal strength in all tested extremity muscles. Sensory.:  Decreased sensation on left side compared to right side of upper and lower extremities Coordination: Rapid alternating movements normal in all extremities. Finger-to-nose and heel-to-shin performed accurately bilaterally. Gait and Station: Arises from chair without difficulty. Stance is normal. Gait demonstrates normal stride length and balance . Able to heel, toe  and tandem walk without difficulty.  Reflexes: 1+ and symmetric. Toes downgoing.      Diagnostic Data (Labs, Imaging, Testing)  CT head without contrast ([facility]) 10/17/2017 IMPRESSION: 1. Diffuse changes of atrophy.  No acute intracranial abnormality. 2. 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  MR brain without contrast ([facility]) MR MRA head without contrast ([facility]) 10/17/2017 IMPRESSION: MRI head: 1. Faint subcentimeter acute/early subacute infarction within the right ventrolateral thalamus. No associated hemorrhage or mass effect. 2. Mild chronic microvascular ischemic changes and moderate parenchymal volume loss of the brain. MRA head: 1. Patent anterior and posterior intracranial circulation. No large vessel occlusion or aneurysm. 2. 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 Multiple segments of mild stenosis.  CT head without contrast 10/19/2017 IMPRESSION: 1. Mild if any hypodensity has developed at the right thalamus correspond to the subtle diffusion abnormality suspected by MRI two days ago. 2. No acute cortically based infarct or other intracranial abnormality identified. 3. ASPECTS is 10.  Cerebral angiogram 10/31/2017 IMPRESSION: Approximately 60-70% stenosis of the right middle cerebral artery M1 segment, and approximately 50-60% stenosis of the left middle cerebral artery M1 segment secondary to atherosclerotic disease. Severe focal stenosis of the right posterior-inferior cerebral artery in its proximal 1/3, and a moderately severe stenosis of the left posterior-inferior cerebral artery in its proximal 1/3. High-grade arteriosclerotic stenosis of the right posterior cerebral artery of the distal P2 segment. Moderate arteriosclerotic stenosis of the distal left posterior cerebral artery P1 segment.    ASSESSMENT: CRIMSON BEER is a 72 y.o. year old male here with recent right thalamic infarct on  10/17/2017 with recurrent symptoms on 10/19/2017 secondary to small vessel disease. Vascular risk factors include pre-DM, HTN and HLD.  Patient is being seen today for follow-up visit and does state worsening left-sided paresthesia despite use of gabapentin.  PLAN: -Continue clopidogrel 75 mg daily  and Lipitor for secondary stroke prevention -Discontinue gabapentin at this time to see if this helps with possible side effects -f/u with Dr. Estanislado Pandy as scheduled for repeat MRI imaging of carotid stenosis in 05/2018 -Long discussion regarding post stroke paresthesia along with fluctuating symptoms especially after activity along with normal recovery of stroke.  He was also advised that it is difficult to determine amount of recovery or length.  Advised that we can trial a different medication in the future such as Topamax or Lyrica but patient hesitant at this time as he is attempting to limit medication use. -F/u with PCP regarding your HLD and HTN management -continue to monitor BP at home -Advised to continue to stay active as tolerated and maintain a healthy diet -Maintain strict control of hypertension with blood pressure goal below 130/90, diabetes with hemoglobin A1c goal below 6.5% and cholesterol with LDL cholesterol (bad cholesterol) goal below 70 mg/dL. I also advised the patient to eat a healthy diet with plenty of whole grains, cereals, fruits and vegetables, exercise regularly and maintain ideal body weight.  Follow up in 6 months or call earlier if needed   Greater than 50% of time during this 25 minute visit was spent on counseling,explanation of diagnosis of right thalamic infarct, reviewing risk factor management of HTN, HLD and pre-DM, planning of further management, discussion with patient and family and coordination of care    Venancio Poisson, [facility]  [facility] [address] [place], [place] [address]  Phone [phone] Fax  [phone]

## 2018-02-26 NOTE — Patient Instructions (Signed)
Continue clopidogrel 75 mg daily  and lipitor  for secondary stroke prevention  Continue to follow up with PCP regarding cholesterol and blood pressure management   Stop gabapentin at this time. We will reach out if Dr. Leonie Man or Dr. Estanislado Pandy recommend any further intervention or need of sooner follow up appointment  Continue current activity as tolerated and maintain a healthy diet  Continue to monitor blood pressure at home  Maintain strict control of hypertension with blood pressure goal below 130/90, diabetes with hemoglobin A1c goal below 6.5% and cholesterol with LDL cholesterol (bad cholesterol) goal below 70 mg/dL. I also advised the patient to eat a healthy diet with plenty of whole grains, cereals, fruits and vegetables, exercise regularly and maintain ideal body weight.  Followup in the future with me in 3 months or call earlier if needed       Thank you for coming to see [place] at [facility]. I hope we have been able to provide you high quality care today.  You may receive a patient satisfaction survey over the next few weeks. We would appreciate your feedback and comments so that we may continue to improve ourselves and the health of our patients.

## 2018-02-27 ENCOUNTER — Encounter: Payer: Self-pay | Admitting: Adult Health

## 2018-02-27 NOTE — Progress Notes (Signed)
I agree with the above plan 

## 2018-04-30 ENCOUNTER — Telehealth (HOSPITAL_COMMUNITY): Payer: Self-pay

## 2018-04-30 ENCOUNTER — Other Ambulatory Visit (HOSPITAL_COMMUNITY): Payer: Self-pay | Admitting: Interventional Radiology

## 2018-04-30 DIAGNOSIS — I771 Stricture of artery: Secondary | ICD-10-CM

## 2018-04-30 NOTE — Telephone Encounter (Signed)
Called to schedule a 6 month f/u mri, no answer, left vm. AW

## 2018-05-11 ENCOUNTER — Ambulatory Visit (HOSPITAL_COMMUNITY)
Admission: RE | Admit: 2018-05-11 | Discharge: 2018-05-11 | Disposition: A | Discharge status: Home / Self Care | Payer: Medicare Other | Source: Ambulatory Visit | Attending: Interventional Radiology | Admitting: Interventional Radiology

## 2018-05-11 ENCOUNTER — Ambulatory Visit (HOSPITAL_COMMUNITY): Payer: Medicare Other

## 2018-05-11 DIAGNOSIS — I771 Stricture of artery: Secondary | ICD-10-CM | POA: Diagnosis present

## 2018-05-11 DIAGNOSIS — I6601 Occlusion and stenosis of right middle cerebral artery: Secondary | ICD-10-CM | POA: Diagnosis not present

## 2018-05-11 LAB — CREATININE, SERUM
Creatinine, Ser: 1.06 mg/dL (ref 0.61–1.24)
GFR calc Af Amer: >60 mL/min (ref >60)
GFR calc non Af Amer: >60 mL/min (ref >60)

## 2018-05-11 MED ORDER — GADOBUTROL 1 MMOL/ML IV SOLN
7.0000 mL | Freq: Once | INTRAVENOUS | Status: AC | PRN
  Administered 2018-05-11: 7 mL via INTRAVENOUS

## 2018-05-11 NOTE — Progress Notes (Signed)
Creatinine is normal.  

## 2018-05-15 ENCOUNTER — Other Ambulatory Visit (HOSPITAL_COMMUNITY): Payer: Self-pay | Admitting: Interventional Radiology

## 2018-05-15 DIAGNOSIS — R531 Weakness: Secondary | ICD-10-CM

## 2018-05-15 DIAGNOSIS — I771 Stricture of artery: Secondary | ICD-10-CM

## 2018-05-16 ENCOUNTER — Emergency Department (HOSPITAL_COMMUNITY): Payer: Medicare Other

## 2018-05-16 ENCOUNTER — Other Ambulatory Visit: Payer: Self-pay

## 2018-05-16 ENCOUNTER — Encounter (HOSPITAL_COMMUNITY): Payer: Self-pay | Admitting: *Deleted

## 2018-05-16 ENCOUNTER — Emergency Department (HOSPITAL_COMMUNITY)
Admission: EM | Admit: 2018-05-16 | Discharge: 2018-05-16 | Disposition: A | Discharge status: Home / Self Care | Payer: Medicare Other | Attending: Emergency Medicine | Admitting: Emergency Medicine

## 2018-05-16 DIAGNOSIS — I10 Essential (primary) hypertension: Secondary | ICD-10-CM | POA: Diagnosis not present

## 2018-05-16 DIAGNOSIS — I639 Cerebral infarction, unspecified: Secondary | ICD-10-CM | POA: Diagnosis not present

## 2018-05-16 DIAGNOSIS — R531 Weakness: Secondary | ICD-10-CM | POA: Insufficient documentation

## 2018-05-16 DIAGNOSIS — Z8673 Personal history of transient ischemic attack (TIA), and cerebral infarction without residual deficits: Secondary | ICD-10-CM | POA: Diagnosis not present

## 2018-05-16 DIAGNOSIS — Z87891 Personal history of nicotine dependence: Secondary | ICD-10-CM | POA: Insufficient documentation

## 2018-05-16 DIAGNOSIS — R200 Anesthesia of skin: Secondary | ICD-10-CM | POA: Diagnosis not present

## 2018-05-16 DIAGNOSIS — Z79899 Other long term (current) drug therapy: Secondary | ICD-10-CM | POA: Insufficient documentation

## 2018-05-16 LAB — APTT: aPTT: 28 seconds (ref 24–36)

## 2018-05-16 LAB — COMPREHENSIVE METABOLIC PANEL
ALT: 24 U/L (ref 0–44)
AST: 21 U/L (ref 15–41)
Albumin: 4 g/dL (ref 3.5–5.0)
Alkaline Phosphatase: 69 U/L (ref 38–126)
Anion gap: 9 (ref 5–15)
BUN: 16 mg/dL (ref 8–23)
CO2: 24 mmol/L (ref 22–32)
Calcium: 9.5 mg/dL (ref 8.9–10.3)
Chloride: 106 mmol/L (ref 98–111)
Creatinine, Ser: 1.12 mg/dL (ref 0.61–1.24)
GFR calc Af Amer: >60 mL/min (ref >60)
GFR calc non Af Amer: >60 mL/min (ref >60)
Glucose, Bld: 139 mg/dL — ABNORMAL HIGH (ref 70–99)
Potassium: 5.1 mmol/L (ref 3.5–5.1)
Sodium: 139 mmol/L (ref 135–145)
Total Bilirubin: 1.1 mg/dL (ref 0.3–1.2)
Total Protein: 6.6 g/dL (ref 6.5–8.1)

## 2018-05-16 LAB — DIFFERENTIAL
Abs Immature Granulocytes: 0.04 10*3/uL (ref 0.00–0.07)
Basophils Absolute: 0.1 10*3/uL (ref 0.0–0.1)
Basophils Relative: 1 %
Eosinophils Absolute: 0.3 10*3/uL (ref 0.0–0.5)
Eosinophils Relative: 3 %
Immature Granulocytes: 1 %
Lymphocytes Relative: 25 %
Lymphs Abs: 2.2 10*3/uL (ref 0.7–4.0)
Monocytes Absolute: 0.7 10*3/uL (ref 0.1–1.0)
Monocytes Relative: 8 %
Neutro Abs: 5.4 10*3/uL (ref 1.7–7.7)
Neutrophils Relative %: 62 %

## 2018-05-16 LAB — CBG MONITORING, ED: Glucose-Capillary: 124 mg/dL — ABNORMAL HIGH (ref 70–99)

## 2018-05-16 LAB — CBC
HCT: 43.7 % (ref 39.0–52.0)
Hemoglobin: 14.4 g/dL (ref 13.0–17.0)
MCH: 30.1 pg (ref 26.0–34.0)
MCHC: 33 g/dL (ref 30.0–36.0)
MCV: 91.2 fL (ref 80.0–100.0)
Platelets: 174 10*3/uL (ref 150–400)
RBC: 4.79 MIL/uL (ref 4.22–5.81)
RDW: 12 % (ref 11.5–15.5)
WBC: 8.6 10*3/uL (ref 4.0–10.5)
nRBC: 0 % (ref 0.0–0.2)

## 2018-05-16 LAB — I-STAT TROPONIN, ED: Troponin i, poc: 0 ng/mL (ref 0.00–0.08)

## 2018-05-16 LAB — PROTIME-INR
INR: 0.97
Prothrombin Time: 12.8 seconds (ref 11.4–15.2)

## 2018-05-16 LAB — I-STAT CREATININE, ED: Creatinine, Ser: 1.1 mg/dL (ref 0.61–1.24)

## 2018-05-16 MED ORDER — SODIUM CHLORIDE 0.9% FLUSH: 3.0000 mL | Freq: Once | INTRAVENOUS | Status: DC

## 2018-05-16 NOTE — Discharge Instructions (Addendum)
If you develop severe headache, blurry vision, weakness, numbness or any other new/concerning symptoms then return to the ER.

## 2018-05-16 NOTE — ED Notes (Signed)
Activated code stroke with [facility]- spoke with phill

## 2018-05-16 NOTE — Consult Note (Signed)
Referring Physician: Dr. Regenia Skeeter    Chief Complaint: Left face, arm and leg numbness.   HPI: Garrett Green is an 73 y.o. male who presents to the ED with acute onset of left face, arm and leg paresthesia and sensory numbness. He had a stroke in 2018 with symptoms that are similar to those he is presenting with today. He sees Dr. Estanislado Pandy for carotid stenosis in follow up but has not had stenting performed. He takes Plavix at home and was switched to this from ASA previously (although his home medications list includes ASA-calcium carbonate tabs, he states that he is not taking ASA)  LSN: [id number] tPA Given: No: NIHSS of 1 due to mild sensory deficit. Risks of tPA outweigh benefits.  Past Medical History:  Diagnosis Date  . Arthritis    "lower back" (10/19/2017)  . GERD (gastroesophageal reflux disease)   . High cholesterol 10/19/2017  . History of blood transfusion 2014   "related to back OR"  . History of chicken pox   . Hypertension    stress test- 8-9 yrs. ago, wnl, [facility]  . Stroke ([place])   . TIA (transient ischemic attack) 10/17/2017; 10/19/2017    Past Surgical History:  Procedure Laterality Date  . BACK SURGERY    . CATARACT EXTRACTION W/ INTRAOCULAR LENS IMPLANT Left   . COLONOSCOPY WITH PROPOFOL N/A 03/16/2016   Procedure: COLONOSCOPY WITH PROPOFOL;  Surgeon: Manya Silvas, MD;  Location: [facility] ENDOSCOPY;  Service: Endoscopy;  Laterality: N/A;  . ESOPHAGOGASTRODUODENOSCOPY (EGD) WITH ESOPHAGEAL DILATION  1990s  . IR ANGIO INTRA EXTRACRAN SEL COM CAROTID INNOMINATE BILAT MOD SED  10/31/2017  . IR ANGIO VERTEBRAL SEL VERTEBRAL BILAT MOD SED  10/31/2017  . LUMBAR LAMINECTOMY/DECOMPRESSION MICRODISCECTOMY Left 07/26/2012   Procedure: LUMBAR LAMINECTOMY/DECOMPRESSION MICRODISCECTOMY 1 LEVEL;  Surgeon: Eustace Moore, MD;  Location: [facility] NEURO ORS;  Service: Neurosurgery;  Laterality: Left;  lumbar five sacral one  . SHOULDER ARTHROSCOPY WITH OPEN ROTATOR CUFF REPAIR Left 1990s  .  TONSILLECTOMY      Family History  Problem Relation Age of Onset  . Heart disease Mother   . Cancer Sister   . Dementia Brother   . Dementia Sister   . Diabetes Brother   . Diabetes Sister    Social History:  reports that he quit smoking about 30 years ago. He has a 25.00 pack-year smoking history. He has never used smokeless tobacco. He reports current alcohol use of about 1.0 standard drinks of alcohol per week. He reports that he does not use drugs.  Allergies:  Allergies  Allergen Reactions  . Sulfa Antibiotics Other (See Comments)    Lethargic and hypotension    Home Medications:    ROS: No headache, trouble with vision, confusion or difficulty with language. Does not endorse ataxia or right sided symptoms.   Physical Examination: Blood pressure (!) 146/83, pulse 69, temperature 97.9 F (36.6 C), temperature source Oral, resp. rate 18, height 5\' 8"  (1.727 m), weight 84.8 kg, SpO2 96 %.  HEENT: [place]/AT Lungs: Respirations unlabored Ext: No edema  Neurologic Examination: Mental Status: Alert, fully oriented, anxious affect.  Speech fluent with intact repetition, naming and comprehension.  Cranial Nerves: II:  Visual fields intact bilaterally. PERRL.   III,IV, VI: EOMI without nystagmus. No ptosis.  V,VII: No facial droop. Temp and FT sensation decreased on the left.  VIII: hearing intact to voice IX,X: No hypophonia XI: Symmetric XII: midline tongue extension  Motor: Right : Upper extremity   5/5  Left:     Upper extremity   5/5  Lower extremity   5/5     Lower extremity   5/5 No pronator drift.  Sensory: Decreased FT and temp sensation to LUE and LLE. No extinction.  Deep Tendon Reflexes:  2+ bilateral brachioradialis and biceps. 1+ patellae. Trace achilles bilaterally.  Plantars: Right: downgoing   Left: downgoing Cerebellar: No ataxia with FNF bilaterally  Gait: Deferred  Results for orders placed or performed during the hospital encounter of 05/16/18  (from the past 48 hour(s))  CBG monitoring, ED     Status: Abnormal   Collection Time: 05/16/18 11:57 AM  Result Value Ref Range   Glucose-Capillary 124 (H) 70 - 99 mg/dL  Protime-INR     Status: None   Collection Time: 05/16/18 11:59 AM  Result Value Ref Range   Prothrombin Time 12.8 11.4 - 15.2 seconds   INR 0.97     Comment: Performed at [facility], [place] [address] [place], [place] [address]  APTT     Status: None   Collection Time: 05/16/18 11:59 AM  Result Value Ref Range   aPTT 28 24 - 36 seconds    Comment: Performed at [place] [address], [place] [address]  CBC     Status: None   Collection Time: 05/16/18 11:59 AM  Result Value Ref Range   WBC 8.6 4.0 - 10.5 K/uL   RBC 4.79 4.22 - 5.81 MIL/uL   Hemoglobin 14.4 13.0 - 17.0 g/dL   HCT 43.7 39.0 - 52.0 %   MCV 91.2 80.0 - 100.0 fL   MCH 30.1 26.0 - 34.0 pg   MCHC 33.0 30.0 - 36.0 g/dL   RDW 12.0 11.5 - 15.5 %   Platelets 174 150 - 400 K/uL   nRBC 0.0 0.0 - 0.2 %    Comment: Performed at [facility], [place] [address], [place], [place] [address]  Differential     Status: None   Collection Time: 05/16/18 11:59 AM  Result Value Ref Range   Neutrophils Relative % 62 %   Neutro Abs 5.4 1.7 - 7.7 K/uL   Lymphocytes Relative 25 %   Lymphs Abs 2.2 0.7 - 4.0 K/uL   Monocytes Relative 8 %   Monocytes Absolute 0.7 0.1 - 1.0 K/uL   Eosinophils Relative 3 %   Eosinophils Absolute 0.3 0.0 - 0.5 K/uL   Basophils Relative 1 %   Basophils Absolute 0.1 0.0 - 0.1 K/uL   Immature Granulocytes 1 %   Abs Immature Granulocytes 0.04 0.00 - 0.07 K/uL    Comment: Performed at [place] [address], [place],  [address]  Comprehensive metabolic panel     Status: Abnormal   Collection Time: 05/16/18 11:59 AM  Result Value Ref Range   Sodium 139 135 - 145 mmol/L   Potassium 5.1 3.5 - 5.1 mmol/L   Chloride 106 98 - 111 mmol/L   CO2 24 22 - 32 mmol/L   Glucose, Bld 139 (H) 70 - 99  mg/dL   BUN 16 8 - 23 mg/dL   Creatinine, Ser 1.12 0.61 - 1.24 mg/dL   Calcium 9.5 8.9 - 10.3 mg/dL   Total Protein 6.6 6.5 - 8.1 g/dL   Albumin 4.0 3.5 - 5.0 g/dL   AST 21 15 - 41 U/L   ALT 24 0 - 44 U/L   Alkaline Phosphatase 69 38 - 126 U/L   Total  Bilirubin 1.1 0.3 - 1.2 mg/dL   GFR calc non Af Amer >60 >60 mL/min   GFR calc Af Amer >60 >60 mL/min   Anion gap 9 5 - 15    Comment: Performed at [facility] [address], [place], [place] [address]  I-stat troponin, ED     Status: None   Collection Time: 05/16/18 12:03 PM  Result Value Ref Range   Troponin i, poc 0.00 0.00 - 0.08 ng/mL   Comment 3            Comment: Due to the release kinetics of cTnI, a negative result within the first hours of the onset of symptoms does not rule out myocardial infarction with certainty. If myocardial infarction is still suspected, repeat the test at appropriate intervals.   I-Stat Creatinine, ED (do not order at [facility])     Status: None   Collection Time: 05/16/18 12:09 PM  Result Value Ref Range   Creatinine, Ser 1.10 0.61 - 1.24 mg/dL   Ct Head Code Stroke Wo Contrast  Result Date: 05/16/2018 CLINICAL DATA:  Code stroke.  Left-sided numbness. EXAM: CT HEAD WITHOUT CONTRAST TECHNIQUE: Contiguous axial images were obtained from the base of the skull through the vertex without intravenous contrast. COMPARISON:  CT head 10/19/2017 FINDINGS: Brain: Mild atrophy unchanged. Negative for hydrocephalus. Small chronic infarct right thalamus unchanged from recent MRI. Negative for acute infarct, hemorrhage, or mass. Vascular: Negative for hyperdense vessel. Mild atherosclerotic calcification in the cavernous carotid bilaterally. 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 Mild mucosal edema in the maxillary sinus bilaterally. Left cataract surgery. No orbital lesion. Other: None ASPECTS ([place] Stroke Program Early CT Score) - Ganglionic  level infarction (caudate, lentiform nuclei, internal capsule, insula, M1-M3 cortex): 7 - Supraganglionic infarction (M4-M6 cortex): 3 Total score (0-10 with 10 being normal): 10 IMPRESSION: 1. No acute intracranial abnormality. 2. ASPECTS is 10 3. These results were called by telephone at the time of interpretation on 05/16/2018 at 12:18 pm to Dr. Cheral Marker , who verbally acknowledged these results. Electronically Signed   By: Franchot Gallo M.D.   On: 05/16/2018 12:18     Assessment: 73 y.o. male presenting with acute onset of left face, LUE and LLE sensory numbness with paresthesia.  1. NIHSS of 1 for sensory deficit. No facial droop, limb weakness, ataxia or speech deficit on exam. IV tPA not indicated given NIHSS of 1 due to mild sensory deficit - risks of tPA outweigh benefits. 2. Sees Dr. Estanislado Pandy as an outpatient for monitoring of carotid stenosis in anticipation of possible stenting in the future 3. Stroke Risk Factors - HTN, stroke/TIA, hypercholesterolemia, carotid stenosis. 4. Had MRI recently which was stable. Unfortunately, will need to repeat today due to new symptoms.  5. Echocardiogram in September of 2019: Mild LVH. Systolic function was normal. No thrombus mentioned in the report.  6. September 2019 carotid ultrasound: Minimal to moderate amount of bilateral atherosclerotic plaque, left greater than right, not resulting in a hemodynamically significant stenosis within either internal carotid artery.  Recommendations: 1. HgbA1c, fasting lipid panel 2. MRI of the brain without contrast 3. PT consult, OT consult, Speech consult 4. Has had an echocardiogram and carotid ultrasound in the past 6 months.  5. Frequent neuro checks 6. Prophylactic therapy- Add ASA 81 mg po qd to his Plavix regimen 7. Risk factor modification 8. Telemetry monitoring 9. Permissive HTN x 24 hours. IVF x 24 hours.  10. Continue  atorvastatin   @Electronically  signed: Dr. Kerney Elbe 05/16/2018, 12:35  PM

## 2018-05-16 NOTE — ED Provider Notes (Addendum)
[facility] EMERGENCY DEPARTMENT Provider Note   CSN: [id number] Arrival date & time: 05/16/18  1149   LEVEL 5 CAVEAT - ACUITY OF CONDITION  History   Chief Complaint Chief Complaint  Patient presents with  . Code Stroke    HPI Garrett Green is a 73 y.o. male.  HPI  73 year old male presents with recurrent strokelike symptoms.  The symptoms started about an hour and a half prior to arrival.  He states he was in the yard and became dizzy and then felt numbness to his face and arm and into his leg.  His leg feels like it is "drawing".  The symptoms have improved since onset but are not resolved.  He has some residual deficits from a stroke in his right thalamus but states these are a little more prominent today and started suddenly.  Past Medical History:  Diagnosis Date  . Arthritis    "lower back" (10/19/2017)  . GERD (gastroesophageal reflux disease)   . High cholesterol 10/19/2017  . History of blood transfusion 2014   "related to back OR"  . History of chicken pox   . Hypertension    stress test- 8-9 yrs. ago, wnl, [facility]  . Stroke ([place])   . TIA (transient ischemic attack) 10/17/2017; 10/19/2017    Patient Active Problem List   Diagnosis Date Noted  . Glaucoma 12/01/2017  . 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 Hyperlipidemia 12/01/2017  . Cerebral artery disease 11/07/2017  . Right thalamic infarction ([place])   . Mild concentric left ventricular hypertrophy (LVH) 10/19/2017  . Prediabetes 11/30/2016  . Spondylosis 10/22/2014  . Dysphagia 10/22/2014  . Insomnia 10/22/2014  . Fam hx-ischem heart disease 08/13/2007  . 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 Arthralgia of hip or thigh 09/26/2006  . Pain in hip 09/26/2006  . Allergic rhinitis 09/17/2001  . GERD (gastroesophageal reflux disease) 11/14/1994  . Essential (primary) hypertension 11/14/1994    Past Surgical History:  Procedure Laterality Date  . BACK SURGERY    . CATARACT  EXTRACTION W/ INTRAOCULAR LENS IMPLANT Left   . COLONOSCOPY WITH PROPOFOL N/A 03/16/2016   Procedure: COLONOSCOPY WITH PROPOFOL;  Surgeon: Manya Silvas, MD;  Location: [facility] ENDOSCOPY;  Service: Endoscopy;  Laterality: N/A;  . ESOPHAGOGASTRODUODENOSCOPY (EGD) WITH ESOPHAGEAL DILATION  1990s  . IR ANGIO INTRA EXTRACRAN SEL COM CAROTID INNOMINATE BILAT MOD SED  10/31/2017  . IR ANGIO VERTEBRAL SEL VERTEBRAL BILAT MOD SED  10/31/2017  . LUMBAR LAMINECTOMY/DECOMPRESSION MICRODISCECTOMY Left 07/26/2012   Procedure: LUMBAR LAMINECTOMY/DECOMPRESSION MICRODISCECTOMY 1 LEVEL;  Surgeon: Eustace Moore, MD;  Location: [facility] NEURO ORS;  Service: Neurosurgery;  Laterality: Left;  lumbar five sacral one  . SHOULDER ARTHROSCOPY WITH OPEN ROTATOR CUFF REPAIR Left 1990s  . TONSILLECTOMY          Home Medications    Prior to Admission medications   Medication Sig Start Date End Date Taking? Authorizing Provider  Aspirin-Calcium Carbonate 81-777 MG TABS Take by mouth.    [provider]  atorvastatin (LIPITOR) 40 MG tablet Take 1 tablet (40 mg total) by mouth daily at 6 PM. 01/15/18   Fisher, Kirstie Peri, MD  Biotin 10000 MCG TABS Take 1 tablet by mouth daily.    [provider]  brimonidine (ALPHAGAN P) 0.1 % SOLN Place 1 drop into the left eye 2 (two) times daily.    [provider]  brimonidine (ALPHAGAN) 0.15 % ophthalmic solution  11/16/17   [provider]  clopidogrel (PLAVIX) 75 MG tablet Take 1 tablet (75 mg total) by mouth daily. 01/15/18   Birdie Sons, MD  fluticasone (FLONASE) 50 MCG/ACT nasal spray Place 2 sprays into both nostrils daily as needed for allergies. 11/30/16   Birdie Sons, MD  HYDROcodone-acetaminophen (NORCO/VICODIN) 5-325 MG tablet Take 1 tablet by mouth every 6 (six) hours as needed. 01/25/18   Birdie Sons, MD  latanoprost (XALATAN) 0.005 % ophthalmic solution Place 1 drop into both eyes at bedtime.    [provider]  loratadine  (CLARITIN) 10 MG tablet Take 10 mg by mouth daily as needed for allergies.    [provider]  LORazepam (ATIVAN) 1 MG tablet Take 1 tablet (1 mg total) by mouth 3 (three) times daily as needed. Patient taking differently: Take 1 mg by mouth as directed. Only for flying 11/30/16   Birdie Sons, MD  ramipril (ALTACE) 10 MG capsule TAKE 1 CAPSULE BY MOUTH  DAILY 02/24/18   Birdie Sons, MD  zolpidem (AMBIEN) 10 MG tablet Take 0.5-1 tablets (5-10 mg total) by mouth at bedtime as needed for sleep. 01/08/18   Birdie Sons, MD    Family History Family History  Problem Relation Age of Onset  . Heart disease Mother   . Cancer Sister   . Dementia Brother   . Dementia Sister   . Diabetes Brother   . Diabetes Sister     Social History Social History   Tobacco Use  . Smoking status: Former Smoker    Packs/day: 1.00    Years: 25.00    Pack years: 25.00    Last attempt to quit: 03/03/1988    Years since quitting: 30.2  . Smokeless tobacco: Never Used  Substance Use Topics  . Alcohol use: Yes    Alcohol/week: 1.0 standard drinks    Types: 1 Standard drinks or equivalent per week  . Drug use: Never     Allergies   Sulfa antibiotics   Review of Systems Review of Systems  Neurological: Positive for weakness and numbness. Negative for headaches.     Physical Exam Updated Vital Signs BP (!) 141/73   Pulse 63   Temp 97.9 F (36.6 C) (Oral)   Resp 19   Ht 5\' 8"  (1.727 m)   Wt 84.8 kg   SpO2 95%   BMI 28.43 kg/m   Physical Exam Vitals signs and nursing note reviewed.  Constitutional:      Appearance: He is well-developed.  HENT:     Head: Normocephalic and atraumatic.     Right Ear: External ear normal.     Left Ear: External ear normal.     Nose: Nose normal.  Eyes:     General:        Right eye: No discharge.        Left eye: No discharge.     Extraocular Movements: Extraocular movements intact.     Pupils: Pupils are equal, round, and reactive  to light.  Neck:     Musculoskeletal: Neck supple.  Cardiovascular:     Rate and Rhythm: Normal rate and regular rhythm.     Heart sounds: Normal heart sounds.  Pulmonary:     Effort: Pulmonary effort is normal.     Breath sounds: Normal breath sounds.  Abdominal:     Palpations: Abdomen is soft.     Tenderness: There is no abdominal tenderness.  Skin:    General: Skin is warm and dry.  Neurological:  Mental Status: He is alert.     Comments: CN 3-12 grossly intact. 5/5 strength in all 4 extremities. Subjective mild decreased sensation to left face, left arm. Normal finger to nose.   Psychiatric:        Mood and Affect: Mood is not anxious.      ED Treatments / Results  Labs (all labs ordered are listed, but only abnormal results are displayed) Labs Reviewed  COMPREHENSIVE METABOLIC PANEL - Abnormal; Notable for the following components:      Result Value   Glucose, Bld 139 (*)    All other components within normal limits  CBG MONITORING, ED - Abnormal; Notable for the following components:   Glucose-Capillary 124 (*)    All other components within normal limits  PROTIME-INR  APTT  CBC  DIFFERENTIAL  RAPID URINE DRUG SCREEN, HOSP PERFORMED  URINALYSIS, ROUTINE W REFLEX MICROSCOPIC  I-STAT TROPONIN, ED  I-STAT CREATININE, ED    EKG EKG Interpretation  Date/Time:  Wednesday May 16 2018 12:28:49 EST Ventricular Rate:  68 PR Interval:    QRS Duration: 89 QT Interval:  371 QTC Calculation: 395 R Axis:   56 Text Interpretation:  Sinus rhythm no acute ST/T changes Confirmed by Sherwood Gambler [phone]) on 05/16/2018 1:34:03 PM   Radiology Mr Brain Wo Contrast  Result Date: 05/16/2018 CLINICAL DATA:  73 y/o  M; numbness of the left face, arm, and leg. EXAM: MRI HEAD WITHOUT CONTRAST TECHNIQUE: Multiplanar, multiecho pulse sequences of the brain and surrounding structures were obtained without intravenous contrast. COMPARISON:  05/16/2018 CT head.  05/11/2018 MRI  head. FINDINGS: Brain: No acute infarction, hemorrhage, hydrocephalus, extra-axial collection or mass lesion. Small chronic infarct in the right thalamus. Stable punctate nonspecific T2 FLAIR hyperintensities in subcortical and periventricular white matter are compatible with mild chronic microvascular ischemic changes. Moderate volume loss of the brain. Vascular: Normal flow voids. Skull and upper cervical spine: Normal marrow signal. Sinuses/Orbits: Mild paranasal sinus mucosal thickening. Small mucous retention cyst in right maxillary sinus. Right sphenoid sinus opacification. Normal signal of mastoid air cells. Left intra-ocular lens replacement. Other: None. IMPRESSION: 1. No acute intracranial abnormality identified. 2. Stable mild chronic microvascular ischemic changes and moderate volume loss of the brain. Stable small chronic infarct of right thalamus. 3. Paranasal sinus mucosal thickening and chronic right sphenoid sinus opacification. Electronically Signed   By: Kristine Garbe M.D.   On: 05/16/2018 14:36   Ct Head Code Stroke Wo Contrast  Result Date: 05/16/2018 CLINICAL DATA:  Code stroke.  Left-sided numbness. EXAM: CT HEAD WITHOUT CONTRAST TECHNIQUE: Contiguous axial images were obtained from the base of the skull through the vertex without intravenous contrast. COMPARISON:  CT head 10/19/2017 FINDINGS: Brain: Mild atrophy unchanged. Negative for hydrocephalus. Small chronic infarct right thalamus unchanged from recent MRI. Negative for acute infarct, hemorrhage, or mass. Vascular: Negative for hyperdense vessel. Mild atherosclerotic calcification in the cavernous carotid bilaterally. 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 Mild mucosal edema in the maxillary sinus bilaterally. Left cataract surgery. No orbital lesion. Other: None ASPECTS ([place] Stroke Program Early CT Score) - Ganglionic level infarction (caudate,  lentiform nuclei, internal capsule, insula, M1-M3 cortex): 7 - Supraganglionic infarction (M4-M6 cortex): 3 Total score (0-10 with 10 being normal): 10 IMPRESSION: 1. No acute intracranial abnormality. 2. ASPECTS is 10 3. These results were called by telephone at the time of interpretation on 05/16/2018 at 12:18 pm to Dr. Cheral Marker , who verbally acknowledged these results. Electronically  Signed   By: Franchot Gallo M.D.   On: 05/16/2018 12:18    Procedures Procedures (including critical care time)  Medications Ordered in ED Medications  sodium chloride flush (NS) 0.9 % injection 3 mL (has no administration in time range)     Initial Impression / Assessment and Plan / ED Course  I have reviewed the triage vital signs and the nursing notes.  Pertinent labs & imaging results that were available during my care of the patient were reviewed by me and considered in my medical decision making (see chart for details).     Patient continues to endorse a little bit of left-sided numbness.  MRI does not show an acute stroke.  I discussed with Dr. Cheral Marker, who recommends that he follow-up with his outpatient neurologist as this does not appear to be an acute CVA.  Denies any urinary symptoms.  Highly doubt UTI in this scenario so I think is reasonable to discharge him before urine is obtained.  He feels well enough for discharge with return precautions  Final Clinical Impressions(s) / ED Diagnoses   Final diagnoses:  Left sided numbness    ED Discharge Orders    None       Sherwood Gambler, MD 05/16/18 1511    Sherwood Gambler, MD 05/16/18 [phone]

## 2018-05-16 NOTE — ED Triage Notes (Signed)
Patient presents to ed via [facility] states he had weakness to his left side and numbness to left side of his face. States he had a stroke in 10/2017. States the numbness appears to be getting worse since his stroke. States he had a MRI on fri. This am numbness in his face was worse today however resolved upon arrival . Alert oriented.

## 2018-05-16 NOTE — ED Notes (Signed)
To MRI now

## 2018-05-16 NOTE — Code Documentation (Signed)
73 yo [place]. Pt was out working putting up a pole at around 1000 when he was noted to be at his baseline. Between 10:00-10:30 pt reports that he started to have a sudden onset of numbness to the left face, arm, and leg. Pt has hx of stroke in July 2019. Pt has follow-up care with [facility] and was seen by Deveshwar for vascular blockage noted in the brain. Complains of increased stiffness to the left side that has been getting worse since his stroke. MRI performed on Friday and follow-up appointment was to be done tomorrow. Pt is alert and oriented. Came in POV and triage activate a Code Stroke. Stroke Team met patient in CT. Initial NIHSS 1 due to sensory decreased on the left side. CT Head negative for hemorrhage. Pt is not a tPA candidate due to being too mild to treat at this time. MRI ordered. Handoff given to [place], Therapist, sports.

## 2018-05-17 ENCOUNTER — Ambulatory Visit (HOSPITAL_COMMUNITY)
Admission: RE | Admit: 2018-05-17 | Discharge: 2018-05-17 | Disposition: A | Discharge status: Home / Self Care | Payer: Medicare Other | Source: Ambulatory Visit | Attending: Interventional Radiology | Admitting: Interventional Radiology

## 2018-05-17 ENCOUNTER — Other Ambulatory Visit: Payer: Self-pay | Admitting: Student

## 2018-05-17 DIAGNOSIS — I771 Stricture of artery: Secondary | ICD-10-CM

## 2018-05-17 DIAGNOSIS — R531 Weakness: Secondary | ICD-10-CM

## 2018-05-17 NOTE — Consult Note (Signed)
Chief Complaint: Patient was seen in consultation today for stroke  Referring Physician(s): Dr. Leonie Man  Supervising Physician: Luanne Bras  Patient Status: [facility] - Out-pt  History of Present Illness: Garrett Green is a 73 y.o. male with past medical history of GERD, high cholesterol, HTN, back pain, and stroke in July 2019 presents to [facility] clinic today for follow-up of his angiogram findings from 10/31/17.  Patient states that since discharge he had been doing well without symptoms until yesterday.  He reports yesterday he was outside working when he developed left-sided facial numbness and tingling which lasted approximately 30 seconds.  Due to concern for a stroke, he presented to the ED for evaluation.  MRI shows stable brain imaging without signs of new infarct and he was discharged home with follow-up in place.  Patient has not had recurring symptoms since yesterday.  He is currently taking Plavix only, no aspirin. He complains of persistent left-sided paresthesia  Associated with numbness and tingling, heaviness, and tightness in his left chest and into his torso. These have been ongoing since his stroke in July.  The patient does not smoke.    Past Medical History:  Diagnosis Date  . Arthritis    "lower back" (10/19/2017)  . GERD (gastroesophageal reflux disease)   . High cholesterol 10/19/2017  . History of blood transfusion 2014   "related to back OR"  . History of chicken pox   . Hypertension    stress test- 8-9 yrs. ago, [facility], [facility]  . Stroke ([place])   . TIA (transient ischemic attack) 10/17/2017; 10/19/2017    Past Surgical History:  Procedure Laterality Date  . BACK SURGERY    . CATARACT EXTRACTION W/ INTRAOCULAR LENS IMPLANT Left   . COLONOSCOPY WITH PROPOFOL N/A 03/16/2016   Procedure: COLONOSCOPY WITH PROPOFOL;  Surgeon: Manya Silvas, MD;  Location: [facility] ENDOSCOPY;  Service: Endoscopy;  Laterality: N/A;  . ESOPHAGOGASTRODUODENOSCOPY (EGD) WITH ESOPHAGEAL  DILATION  1990s  . IR ANGIO INTRA EXTRACRAN SEL COM CAROTID INNOMINATE BILAT MOD SED  10/31/2017  . IR ANGIO VERTEBRAL SEL VERTEBRAL BILAT MOD SED  10/31/2017  . LUMBAR LAMINECTOMY/DECOMPRESSION MICRODISCECTOMY Left 07/26/2012   Procedure: LUMBAR LAMINECTOMY/DECOMPRESSION MICRODISCECTOMY 1 LEVEL;  Surgeon: Eustace Moore, MD;  Location: [facility] NEURO ORS;  Service: Neurosurgery;  Laterality: Left;  lumbar five sacral one  . SHOULDER ARTHROSCOPY WITH OPEN ROTATOR CUFF REPAIR Left 1990s  . TONSILLECTOMY      Allergies: Sulfa antibiotics  Medications: Prior to Admission medications   Medication Sig Start Date End Date Taking? Authorizing Provider  Aspirin-Calcium Carbonate 81-777 MG TABS Take by mouth.    [provider]  atorvastatin (LIPITOR) 40 MG tablet Take 1 tablet (40 mg total) by mouth daily at 6 PM. 01/15/18   Fisher, Kirstie Peri, MD  Biotin 10000 MCG TABS Take 1 tablet by mouth daily.    [provider]  brimonidine (ALPHAGAN P) 0.1 % SOLN Place 1 drop into the left eye 2 (two) times daily.    [provider]  brimonidine (ALPHAGAN) 0.15 % ophthalmic solution  11/16/17   [provider]  clopidogrel (PLAVIX) 75 MG tablet Take 1 tablet (75 mg total) by mouth daily. 01/15/18   Birdie Sons, MD  fluticasone (FLONASE) 50 MCG/ACT nasal spray Place 2 sprays into both nostrils daily as needed for allergies. 11/30/16   Birdie Sons, MD  HYDROcodone-acetaminophen (NORCO/VICODIN) 5-325 MG tablet Take 1 tablet by mouth every 6 (six) hours as needed. 01/25/18  Birdie Sons, MD  latanoprost (XALATAN) 0.005 % ophthalmic solution Place 1 drop into both eyes at bedtime.    [provider]  loratadine (CLARITIN) 10 MG tablet Take 10 mg by mouth daily as needed for allergies.    [provider]  LORazepam (ATIVAN) 1 MG tablet Take 1 tablet (1 mg total) by mouth 3 (three) times daily as needed. Patient taking differently: Take 1 mg by mouth as directed.  Only for flying 11/30/16   Birdie Sons, MD  ramipril (ALTACE) 10 MG capsule TAKE 1 CAPSULE BY MOUTH  DAILY 02/24/18   Birdie Sons, MD  zolpidem (AMBIEN) 10 MG tablet Take 0.5-1 tablets (5-10 mg total) by mouth at bedtime as needed for sleep. 01/08/18   Birdie Sons, MD     Family History  Problem Relation Age of Onset  . Heart disease Mother   . Cancer Sister   . Dementia Brother   . Dementia Sister   . Diabetes Brother   . Diabetes Sister     Social History   Socioeconomic History  . Marital status: Married    Spouse name: Not on file  . Number of children: Not on file  . Years of education: Not on file  . Highest education level: Not on file  Occupational History    Employer: RETIRED  Social Needs  . Financial resource strain: Not on file  . Food insecurity:    Worry: Not on file    Inability: Not on file  . Transportation needs:    Medical: Not on file    Non-medical: Not on file  Tobacco Use  . Smoking status: Former Smoker    Packs/day: 1.00    Years: 25.00    Pack years: 25.00    Last attempt to quit: 03/03/1988    Years since quitting: 30.2  . Smokeless tobacco: Never Used  Substance and Sexual Activity  . Alcohol use: Yes    Alcohol/week: 1.0 standard drinks    Types: 1 Standard drinks or equivalent per week  . Drug use: Never  . Sexual activity: Yes  Lifestyle  . Physical activity:    Days per week: Not on file    Minutes per session: Not on file  . Stress: Not on file  Relationships  . Social connections:    Talks on phone: Not on file    Gets together: Not on file    Attends religious service: Not on file    Active member of club or organization: Not on file    Attends meetings of clubs or organizations: Not on file    Relationship status: Not on file  Other Topics Concern  . Not on file  Social History Narrative  . Not on file     Review of Systems: A 12 point ROS discussed and pertinent positives are indicated in the HPI  above.  All other systems are negative.  Review of Systems  Constitutional: Negative for fatigue and fever.  Respiratory: Positive for chest tightness. Negative for cough, choking and shortness of breath.   Musculoskeletal: Positive for back pain (chronic).  Neurological: Positive for numbness (yesterday). Negative for dizziness, facial asymmetry, light-headedness and headaches.  Psychiatric/Behavioral: Negative for behavioral problems and confusion.    Vital Signs: There were no vitals taken for this visit.  Physical Exam Vitals signs and nursing note reviewed.  Constitutional:      General: He is not in acute distress. Eyes:     Extraocular  Movements: Extraocular movements intact.     Pupils: Pupils are equal, round, and reactive to light.  Musculoskeletal: Normal range of motion.  Skin:    General: Skin is warm and dry.  Neurological:     General: No focal deficit present.     Mental Status: He is alert and oriented to person, place, and time.  Psychiatric:        Mood and Affect: Mood normal.        Behavior: Behavior normal.        Thought Content: Thought content normal.        Judgment: Judgment normal.     Imaging: Mr Virgel Paling HK Contrast  Result Date: 05/11/2018 CLINICAL DATA:  Six-month follow-up, intracranial atherosclerotic disease. EXAM: MRI HEAD WITHOUT AND WITH CONTRAST MRA HEAD WITHOUT CONTRAST TECHNIQUE: Multiplanar, multiecho pulse sequences of the brain and surrounding structures were obtained without and with intravenous contrast. Angiographic images of the head were obtained using MRA technique without contrast. CONTRAST:  Gadavist 7 mL. COMPARISON:  Cerebral angiogram 10/31/2017.  MR head 10/17/2017. FINDINGS: MRI HEAD FINDINGS Brain: No evidence for acute infarction, hemorrhage, mass lesion, hydrocephalus, or extra-axial fluid. Normal for age cerebral volume. No significant white matter disease. Chronic RIGHT thalamic infarct. Post infusion, no abnormal  enhancement of the brain or meninges. Vascular: Reported separately. Skull and upper cervical spine: No acute findings. Sinuses/Orbits: RIGHT sphenoid sinus mucous retention cyst or polyp, stable. Other: None. MRA HEAD FINDINGS Internal carotid arteries are widely patent, both anterior cerebral arteries are widely patent. Severe distal RIGHT M1 MCA stenosis is stable. Mild distal LEFT M1 stenosis is stable. No M2 or M3 MCA branch stenosis or occlusion. Basilar artery widely patent with both vertebrals contributing. Mild BILATERAL P1 stenosis. Severe distal RIGHT P2 stenosis. IMPRESSION: MRI of the brain demonstrates no acute or focal intracranial abnormality. No abnormal postcontrast enhancement. Chronic RIGHT thalamic lacune. Chronic RIGHT sphenoid sinus disease. Intracranial atherosclerotic change is stable. The dominant finding remains high-grade distal RIGHT M1 MCA stenosis. Electronically Signed   By: Staci Righter M.D.   On: 05/11/2018 15:11   Mr Brain Wo Contrast  Result Date: 05/16/2018 CLINICAL DATA:  73 y/o  M; numbness of the left face, arm, and leg. EXAM: MRI HEAD WITHOUT CONTRAST TECHNIQUE: Multiplanar, multiecho pulse sequences of the brain and surrounding structures were obtained without intravenous contrast. COMPARISON:  05/16/2018 CT head.  05/11/2018 MRI head. FINDINGS: Brain: No acute infarction, hemorrhage, hydrocephalus, extra-axial collection or mass lesion. Small chronic infarct in the right thalamus. Stable punctate nonspecific T2 FLAIR hyperintensities in subcortical and periventricular white matter are compatible with mild chronic microvascular ischemic changes. Moderate volume loss of the brain. Vascular: Normal flow voids. Skull and upper cervical spine: Normal marrow signal. Sinuses/Orbits: Mild paranasal sinus mucosal thickening. Small mucous retention cyst in right maxillary sinus. Right sphenoid sinus opacification. Normal signal of mastoid air cells. Left intra-ocular lens  replacement. Other: None. IMPRESSION: 1. No acute intracranial abnormality identified. 2. Stable mild chronic microvascular ischemic changes and moderate volume loss of the brain. Stable small chronic infarct of right thalamus. 3. Paranasal sinus mucosal thickening and chronic right sphenoid sinus opacification. Electronically Signed   By: Kristine Garbe M.D.   On: 05/16/2018 14:36   Mr Jeri Cos VQ Contrast  Result Date: 05/11/2018 CLINICAL DATA:  Six-month follow-up, intracranial atherosclerotic disease. EXAM: MRI HEAD WITHOUT AND WITH CONTRAST MRA HEAD WITHOUT CONTRAST TECHNIQUE: Multiplanar, multiecho pulse sequences of the brain and surrounding structures were obtained without  and with intravenous contrast. Angiographic images of the head were obtained using MRA technique without contrast. CONTRAST:  Gadavist 7 mL. COMPARISON:  Cerebral angiogram 10/31/2017.  MR head 10/17/2017. FINDINGS: MRI HEAD FINDINGS Brain: No evidence for acute infarction, hemorrhage, mass lesion, hydrocephalus, or extra-axial fluid. Normal for age cerebral volume. No significant white matter disease. Chronic RIGHT thalamic infarct. Post infusion, no abnormal enhancement of the brain or meninges. Vascular: Reported separately. Skull and upper cervical spine: No acute findings. Sinuses/Orbits: RIGHT sphenoid sinus mucous retention cyst or polyp, stable. Other: None. MRA HEAD FINDINGS Internal carotid arteries are widely patent, both anterior cerebral arteries are widely patent. Severe distal RIGHT M1 MCA stenosis is stable. Mild distal LEFT M1 stenosis is stable. No M2 or M3 MCA branch stenosis or occlusion. Basilar artery widely patent with both vertebrals contributing. Mild BILATERAL P1 stenosis. Severe distal RIGHT P2 stenosis. IMPRESSION: MRI of the brain demonstrates no acute or focal intracranial abnormality. No abnormal postcontrast enhancement. Chronic RIGHT thalamic lacune. Chronic RIGHT sphenoid sinus disease.  Intracranial atherosclerotic change is stable. The dominant finding remains high-grade distal RIGHT M1 MCA stenosis. Electronically Signed   By: Staci Righter M.D.   On: 05/11/2018 15:11   Ct Head Code Stroke Wo Contrast  Result Date: 05/16/2018 CLINICAL DATA:  Code stroke.  Left-sided numbness. EXAM: CT HEAD WITHOUT CONTRAST TECHNIQUE: Contiguous axial images were obtained from the base of the skull through the vertex without intravenous contrast. COMPARISON:  CT head 10/19/2017 FINDINGS: Brain: Mild atrophy unchanged. Negative for hydrocephalus. Small chronic infarct right thalamus unchanged from recent MRI. Negative for acute infarct, hemorrhage, or mass. Vascular: Negative for hyperdense vessel. Mild atherosclerotic calcification in the cavernous carotid bilaterally. 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 Mild mucosal edema in the maxillary sinus bilaterally. Left cataract surgery. No orbital lesion. Other: None ASPECTS ([place] Stroke Program Early CT Score) - Ganglionic level infarction (caudate, lentiform nuclei, internal capsule, insula, M1-M3 cortex): 7 - Supraganglionic infarction (M4-M6 cortex): 3 Total score (0-10 with 10 being normal): 10 IMPRESSION: 1. No acute intracranial abnormality. 2. ASPECTS is 10 3. These results were called by telephone at the time of interpretation on 05/16/2018 at 12:18 pm to Dr. Cheral Marker , who verbally acknowledged these results. Electronically Signed   By: Franchot Gallo M.D.   On: 05/16/2018 12:18    Labs:  CBC: Recent Labs    10/19/17 1148 10/19/17 1157 10/31/17 0706 12/01/17 0944 05/16/18 1159  WBC 9.7  --  8.6 7.8 8.6  HGB 16.9 17.3* 15.5 14.2 14.4  HCT 50.3 51.0 43.9 41.4 43.7  PLT 233  --  212 204 174    COAGS: Recent Labs    10/17/17 1540 10/19/17 1148 10/31/17 0706 05/16/18 1159  INR 0.95 0.95 0.97 0.97  APTT 29 27 29 28     BMP: Recent Labs    10/20/17 0431  10/31/17 0706 12/01/17 0944 05/11/18 1127 05/16/18 1159 05/16/18 1209  NA 140 136 140  --  139  --   K 5.0 4.6 5.0  --  5.1  --   CL 108 104 105  --  106  --   CO2 26 23 17*  --  24  --   GLUCOSE 122* 134* 113*  --  139*  --   BUN 14 19 20   --  16  --   CALCIUM 9.2 9.6 9.7  --  9.5  --   CREATININE 1.12 1.25* 0.96 1.06 1.12 1.10  GFRNONAA >60 57* 80 >60 >60  --   GFRAA >60 >60 93 >60 >60  --     LIVER FUNCTION TESTS: Recent Labs    10/17/17 1540 10/19/17 1148 12/01/17 0944 05/16/18 1159  BILITOT 0.7 1.3* 0.6 1.1  AST 19 27 21 21   ALT 16 21 23 24   ALKPHOS 74 67 67 69  PROT 7.4 7.4 6.8 6.6  ALBUMIN 4.1 4.2 4.6 4.0    TUMOR MARKERS: No results for input(s): AFPTM, CEA, CA199, CHROMGRNA in the last 8760 hours.  Assessment and Plan: R MCA CVA Patient with history of a stroke in July 2019.  Angiogram at that time showed: Approximately 60-70% stenosis of the right middle cerebral artery M1 segment, and approximately 50-60% stenosis of the left middle cerebral artery M1 segment secondary to atherosclerotic disease. Severe focal stenosis of the right posterior-inferior cerebral artery in its proximal 1/3, and a moderately severe stenosis of the left posterior-inferior cerebral artery in its proximal 1/3. Patient has been asymptomatic since his stroke and was doing well at home until yesterday when he developed 20-30 seconds of left facial numbness. He presented to the ED where imaging was obtained, however no evidence of acute stroke was found.    Dr. Estanislado Pandy has reviewed angiogram and MR imaging and discussed with the patient and his wife noting areas of stenosis.  Options of conservative management with repeat imaging for surveillance vs. Early intervention discussed with patient.  Given only 1 episode of symptoms of facial numbness without evidence of new or acute stroke on MRI/MRA, decision was made to wait on intervention at this time.  Patient should continue Plavix  daily, add aspirin 325 mg daily.  A p2Y12 should be obtained in 5 days.  This has been ordered.  Repeat MRI/MRA in 3-4 months. Patient should presents to ED for new symptoms.  Patient and wife verbalize understanding and agree with care plan.   Thank you for this interesting consult.  I greatly enjoyed meeting Garrett Green and look forward to participating in their care.  A copy of this report was sent to the requesting provider on this date.  Electronically Signed: Docia Barrier, PA 05/17/2018, 3:07 PM   I spent a total of 20 Minutes    in face to face in clinical consultation, greater than 50% of which was counseling/coordinating care for R MCA stenosis.

## 2018-05-22 ENCOUNTER — Other Ambulatory Visit (HOSPITAL_COMMUNITY): Payer: Self-pay

## 2018-05-22 LAB — PLATELET INHIBITION P2Y12: Platelet Function  P2Y12: 181 [PRU] — ABNORMAL LOW (ref 194–418)

## 2018-05-23 ENCOUNTER — Telehealth (HOSPITAL_COMMUNITY): Payer: Self-pay

## 2018-05-23 NOTE — Telephone Encounter (Signed)
Pt agreed to f/u in 4 months with mri/mra. AW

## 2018-06-13 ENCOUNTER — Other Ambulatory Visit: Payer: Self-pay

## 2018-06-13 DIAGNOSIS — M47816 Spondylosis without myelopathy or radiculopathy, lumbar region: Secondary | ICD-10-CM

## 2018-06-13 MED ORDER — HYDROCODONE-ACETAMINOPHEN 5-325 MG PO TABS: 1.0000 | ORAL_TABLET | Freq: Four times a day (QID) | ORAL | 0 refills | Status: DC | PRN

## 2018-06-22 ENCOUNTER — Telehealth: Payer: Self-pay | Admitting: Family Medicine

## 2018-06-22 NOTE — Telephone Encounter (Signed)
Is it okay to call and schedule?  Thanks,   -Mickel Baas

## 2018-06-22 NOTE — Telephone Encounter (Signed)
Patient's wife Letta Median called.  She DOES NOT WANT her husband to know she has called. Patient is still having a lot of pain and numbness on his left side. He thinks it is from the Lipitor but will not make an appt.   Letta Median thinks he needs labs drawn.     She wants [place] to call him and tell him it's time for his labs to be drawn and for an OV with Dr. Caryn Section.

## 2018-07-05 ENCOUNTER — Other Ambulatory Visit: Payer: Self-pay | Admitting: Family Medicine

## 2018-08-12 ENCOUNTER — Other Ambulatory Visit: Payer: Self-pay | Admitting: Family Medicine

## 2018-08-30 ENCOUNTER — Telehealth: Payer: Self-pay

## 2018-08-30 NOTE — Telephone Encounter (Signed)
Pt and wife were explained that visit will be video due to COVID 19.

## 2018-08-30 NOTE — Telephone Encounter (Signed)
I called pts wife Garrett Green that his appt will be cancel next week due to provider being out. I stated it will be r/s with Janett Billow NP and will be a video visit. The wife stated " oh gosh not again , I had a video visit this morning and it was horrible". My husband saw how I had problems this morning. The pt got on the phone and stated he will call back to reschedule. I advise pt to call back to reschedule with Janett Billow NP. He verbalized understanding.

## 2018-08-30 NOTE — Telephone Encounter (Signed)
If he is declining virtual visit, he can be scheduled for office visit with acknowledgment of [id number] risk of complications medium level.

## 2018-09-05 ENCOUNTER — Ambulatory Visit: Payer: Medicare Other | Admitting: Adult Health

## 2018-10-16 ENCOUNTER — Other Ambulatory Visit: Payer: Self-pay

## 2018-10-16 DIAGNOSIS — M47816 Spondylosis without myelopathy or radiculopathy, lumbar region: Secondary | ICD-10-CM

## 2018-10-16 MED ORDER — HYDROCODONE-ACETAMINOPHEN 5-325 MG PO TABS: 1.0000 | ORAL_TABLET | Freq: Four times a day (QID) | ORAL | 0 refills | Status: DC | PRN

## 2018-10-16 NOTE — Telephone Encounter (Signed)
Patient wife(Faye) called and requested refill for patient Norco.L.O.V. 12/07/2017, please advise.

## 2018-10-19 ENCOUNTER — Telehealth: Payer: Self-pay

## 2018-10-19 NOTE — Telephone Encounter (Signed)
You can put on at 9:40 September 1st and block the 10:40 slot.

## 2018-10-19 NOTE — Telephone Encounter (Signed)
Patient's wife calling that they are scheduled with Dr. Caryn Section for August 27 and Dr.Fisher is not here that day. I scheduled her husband for Sept 1, but the wife wants to have the physical the same day if possible and she was asking for an appointment in August or July. She asked to see if they can be scheduled together. Please call patient and advise. I didn't reschedule her because she wanted me to ask fist but went ahead and kept her husband appt until she hears from [place]. I canceled the one's for 08/27

## 2018-10-22 NOTE — Telephone Encounter (Signed)
Apt scheduled, pt advised.   Thanks,   -Mickel Baas

## 2018-11-08 ENCOUNTER — Other Ambulatory Visit (HOSPITAL_COMMUNITY): Payer: Self-pay | Admitting: Interventional Radiology

## 2018-11-08 ENCOUNTER — Telehealth (HOSPITAL_COMMUNITY): Payer: Self-pay

## 2018-11-08 DIAGNOSIS — I639 Cerebral infarction, unspecified: Secondary | ICD-10-CM

## 2018-11-08 DIAGNOSIS — I6381 Other cerebral infarction due to occlusion or stenosis of small artery: Secondary | ICD-10-CM

## 2018-11-08 NOTE — Telephone Encounter (Signed)
Called to schedule f/u mri, no answer, left vm. AW 

## 2018-11-23 ENCOUNTER — Ambulatory Visit (HOSPITAL_COMMUNITY)
Admission: RE | Admit: 2018-11-23 | Discharge: 2018-11-23 | Disposition: A | Discharge status: Home / Self Care | Payer: Medicare Other | Source: Ambulatory Visit | Attending: Interventional Radiology | Admitting: Interventional Radiology

## 2018-11-23 ENCOUNTER — Ambulatory Visit (HOSPITAL_COMMUNITY): Payer: Medicare Other

## 2018-11-23 ENCOUNTER — Other Ambulatory Visit: Payer: Self-pay

## 2018-11-23 DIAGNOSIS — I639 Cerebral infarction, unspecified: Secondary | ICD-10-CM

## 2018-11-23 DIAGNOSIS — I6381 Other cerebral infarction due to occlusion or stenosis of small artery: Secondary | ICD-10-CM

## 2018-11-23 LAB — CREATININE, SERUM
Creatinine, Ser: 1.41 mg/dL — ABNORMAL HIGH (ref 0.61–1.24)
GFR calc Af Amer: 58 mL/min — ABNORMAL LOW (ref >60)
GFR calc non Af Amer: 50 mL/min — ABNORMAL LOW (ref >60)

## 2018-11-23 MED ORDER — GADOBUTROL 1 MMOL/ML IV SOLN
8.0000 mL | Freq: Once | INTRAVENOUS | Status: AC | PRN
  Administered 2018-11-23: 8 mL via INTRAVENOUS

## 2018-12-06 ENCOUNTER — Ambulatory Visit: Payer: Medicare Other | Admitting: Family Medicine

## 2018-12-11 ENCOUNTER — Encounter: Payer: Self-pay | Admitting: Family Medicine

## 2018-12-11 ENCOUNTER — Other Ambulatory Visit: Payer: Self-pay

## 2018-12-11 ENCOUNTER — Ambulatory Visit (INDEPENDENT_AMBULATORY_CARE_PROVIDER_SITE_OTHER): Payer: Medicare Other | Admitting: Family Medicine

## 2018-12-11 VITALS — BP 136/68 | HR 62 | Temp 96.8°F | Resp 16 | Ht 68.0 in | Wt 189.0 lb

## 2018-12-11 DIAGNOSIS — Z23 Encounter for immunization: Secondary | ICD-10-CM

## 2018-12-11 DIAGNOSIS — Z0000 Encounter for general adult medical examination without abnormal findings: Secondary | ICD-10-CM | POA: Diagnosis not present

## 2018-12-11 DIAGNOSIS — I10 Essential (primary) hypertension: Secondary | ICD-10-CM

## 2018-12-11 DIAGNOSIS — E785 Hyperlipidemia, unspecified: Secondary | ICD-10-CM | POA: Diagnosis not present

## 2018-12-11 DIAGNOSIS — M791 Myalgia, unspecified site: Secondary | ICD-10-CM | POA: Diagnosis not present

## 2018-12-11 DIAGNOSIS — R7303 Prediabetes: Secondary | ICD-10-CM

## 2018-12-11 DIAGNOSIS — G4700 Insomnia, unspecified: Secondary | ICD-10-CM

## 2018-12-11 DIAGNOSIS — Z125 Encounter for screening for malignant neoplasm of prostate: Secondary | ICD-10-CM | POA: Diagnosis not present

## 2018-12-11 DIAGNOSIS — I6990 Unspecified sequelae of unspecified cerebrovascular disease: Secondary | ICD-10-CM

## 2018-12-11 MED ORDER — ZOLPIDEM TARTRATE 10 MG PO TABS: 5.0000 mg | ORAL_TABLET | Freq: Every evening | ORAL | 1 refills | Status: DC | PRN

## 2018-12-11 NOTE — Progress Notes (Signed)
Patient: Garrett Green, Male    DOB: 10-Mar-1946, 73 y.o.   MRN: [id number] Visit Date: 12/11/2018  Today's Provider: Lelon Huh, MD   Chief Complaint  Patient presents with  . Medicare Wellness   Subjective:     Annual wellness visit [name] is a 73 y.o. male. He feels fairly well. He reports exercising daily. He reports he is sleeping fairly well.  -----------------------------------------------------------  Follow up for Insomnia:  The patient was last seen for this 1 years ago. Changes made at last visit include starting Ambien.  He reports good compliance with treatment. He feels that condition is Improved. He is not having side effects.   ------------------------------------------------------------------------------------  Hypertension, follow-up:  BP Readings from Last 3 Encounters:  12/11/18 136/68  05/16/18 (!) 141/73  02/26/18 121/78    He was last seen for hypertension 1 years ago.  BP at that visit was 127/79. Management since that visit includes no . He reports good compliance with treatment. He is not having side effects.  He is exercising. He is adherent to low salt diet.   Outside blood pressures are 125-130/70. He is experiencing none.  Patient denies chest pain, chest pressure/discomfort, claudication, dyspnea, exertional chest pressure/discomfort, fatigue, irregular heart beat, lower extremity edema, near-syncope, orthopnea, palpitations, paroxysmal nocturnal dyspnea, syncope and tachypnea.   Cardiovascular risk factors include advanced age (older than 31 for men, 31 for women), dyslipidemia, hypertension and male gender.  Use of agents associated with hypertension: none.     Weight trend: increasing steadily Wt Readings from Last 3 Encounters:  12/11/18 189 lb (85.7 kg)  07/18/18 180 lb (81.6 kg)  05/16/18 187 lb (84.8 kg)    Current diet: well balanced   ------------------------------------------------------------------------  Lipid/Cholesterol, Follow-up:   Last seen for this1 years ago.  Management changes since that visit include none. . Last Lipid Panel:    Component Value Date/Time   CHOL 99 (L) 12/01/2017 0944   TRIG 88 12/01/2017 0944   HDL 41 12/01/2017 0944   CHOLHDL 2.4 12/01/2017 0944   CHOLHDL 4.2 10/18/2017 0555   VLDL 46 (H) 10/18/2017 0555   LDLCALC 40 12/01/2017 0944    Risk factors for vascular disease include hypercholesterolemia and hypertension  He reports good compliance with treatment. He is not having side effects.  Current symptoms include none and have been stable. Weight trend: increasing steadily Prior visit with dietician: no Current diet: well balanced Current exercise: yard work  [employer] from Last 3 Encounters:  12/11/18 189 lb (85.7 kg)  07/18/18 180 lb (81.6 kg)  05/16/18 187 lb (84.8 kg)    -------------------------------------------------------------------   Review of Systems  Constitutional: Negative for appetite change, chills, fatigue and fever.  HENT: Negative for congestion, ear pain, hearing loss, nosebleeds and trouble swallowing.   Eyes: Positive for itching and visual disturbance. Negative for pain.  Respiratory: Negative for cough, chest tightness and shortness of breath.   Cardiovascular: Negative for chest pain, palpitations and leg swelling.  Gastrointestinal: Negative for abdominal pain, blood in stool, constipation, diarrhea, nausea and vomiting.  Endocrine: Positive for heat intolerance. Negative for polydipsia, polyphagia and polyuria.  Genitourinary: Negative for dysuria and flank pain.  Musculoskeletal: Negative for arthralgias, back pain, joint swelling, myalgias and neck stiffness.  Skin: Negative for color change, rash and wound.  Neurological: Positive for dizziness. Negative for tremors, seizures, speech difficulty, weakness, light-headedness and  headaches.  Psychiatric/Behavioral: Positive for sleep disturbance. Negative for behavioral problems, confusion,  decreased concentration and dysphoric mood. The patient is not nervous/anxious.   All other systems reviewed and are negative.   Social History   Socioeconomic History  . Marital status: Married    Spouse name: Not on file  . Number of children: Not on file  . Years of education: Not on file  . Highest education level: Not on file  Occupational History    Employer: RETIRED  Social Needs  . Financial resource strain: Not on file  . Food insecurity    Worry: Not on file    Inability: Not on file  . Transportation needs    Medical: Not on file    Non-medical: Not on file  Tobacco Use  . Smoking status: Former Smoker    Packs/day: 1.00    Years: 25.00    Pack years: 25.00    Quit date: 03/03/1988    Years since quitting: 30.7  . Smokeless tobacco: Never Used  Substance and Sexual Activity  . Alcohol use: Yes    Alcohol/week: 1.0 standard drinks    Types: 1 Standard drinks or equivalent per week  . Drug use: Never  . Sexual activity: Yes  Lifestyle  . Physical activity    Days per week: Not on file    Minutes per session: Not on file  . Stress: Not on file  Relationships  . Social Herbalist on phone: Not on file    Gets together: Not on file    Attends religious service: Not on file    Active member of club or organization: Not on file    Attends meetings of clubs or organizations: Not on file    Relationship status: Not on file  . Intimate partner violence    Fear of current or ex partner: Not on file    Emotionally abused: Not on file    Physically abused: Not on file    Forced sexual activity: Not on file  Other Topics Concern  . Not on file  Social History Narrative  . Not on file    Past Medical History:  Diagnosis Date  . Arthritis    "lower back" (10/19/2017)  . GERD (gastroesophageal reflux disease)   . High cholesterol  10/19/2017  . History of blood transfusion 2014   "related to back OR"  . History of chicken pox   . Hypertension    stress test- 8-9 yrs. ago, wnl, [facility]  . Stroke ([place])   . TIA (transient ischemic attack) 10/17/2017; 10/19/2017     Patient Active Problem List   Diagnosis Date Noted  . Glaucoma 12/01/2017  . 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 Hyperlipidemia 12/01/2017  . Cerebral artery disease 11/07/2017  . 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 Mild concentric left ventricular hypertrophy (LVH) 10/19/2017  . Prediabetes 11/30/2016  . Spondylosis 10/22/2014  . Dysphagia 10/22/2014  . Insomnia 10/22/2014  . Fam hx-ischem heart disease 08/13/2007  . 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 Arthralgia of hip or thigh 09/26/2006  . Pain in hip 09/26/2006  . Allergic rhinitis 09/17/2001  . GERD (gastroesophageal reflux disease) 11/14/1994  . Essential (primary) hypertension 11/14/1994    Past Surgical History:  Procedure Laterality Date  . BACK SURGERY    . CATARACT EXTRACTION W/ INTRAOCULAR LENS IMPLANT Left   . COLONOSCOPY WITH PROPOFOL N/A 03/16/2016   Procedure: COLONOSCOPY WITH PROPOFOL;  Surgeon: Manya Silvas, MD;  Location: [facility] ENDOSCOPY;  Service: Endoscopy;  Laterality: N/A;  .  ESOPHAGOGASTRODUODENOSCOPY (EGD) WITH ESOPHAGEAL DILATION  1990s  . IR ANGIO INTRA EXTRACRAN SEL COM CAROTID INNOMINATE BILAT MOD SED  10/31/2017  . IR ANGIO VERTEBRAL SEL VERTEBRAL BILAT MOD SED  10/31/2017  . LUMBAR LAMINECTOMY/DECOMPRESSION MICRODISCECTOMY Left 07/26/2012   Procedure: LUMBAR LAMINECTOMY/DECOMPRESSION MICRODISCECTOMY 1 LEVEL;  Surgeon: Eustace Moore, MD;  [facility];  Service: Neurosurgery;  Laterality: Left;  lumbar five sacral one  . SHOULDER ARTHROSCOPY WITH OPEN ROTATOR CUFF REPAIR Left 1990s  . TONSILLECTOMY      His family history includes Cancer in his sister; Dementia in his brother and sister; Diabetes in his brother and sister; Heart disease  in his mother.   Current Outpatient Medications:  .  aspirin 325 MG tablet, Take 325 mg by mouth daily., Disp: , Rfl:  .  atorvastatin (LIPITOR) 40 MG tablet, TAKE 1 TABLET (40 MG TOTAL) BY MOUTH DAILY AT 6 PM., Disp: 90 tablet, Rfl: 4 .  brimonidine (ALPHAGAN P) 0.1 % SOLN, Place 1 drop into the left eye 2 (two) times daily., Disp: , Rfl:  .  clopidogrel (PLAVIX) 75 MG tablet, TAKE 1 TABLET BY MOUTH EVERY DAY, Disp: 90 tablet, Rfl: 4 .  fluticasone (FLONASE) 50 MCG/ACT nasal spray, Place 2 sprays into both nostrils daily as needed for allergies., Disp: 48 g, Rfl: 1 .  HYDROcodone-acetaminophen (NORCO/VICODIN) 5-325 MG tablet, Take 1 tablet by mouth every 6 (six) hours as needed., Disp: 120 tablet, Rfl: 0 .  latanoprost (XALATAN) 0.005 % ophthalmic solution, Place 1 drop into both eyes at bedtime., Disp: , Rfl:  .  loratadine (CLARITIN) 10 MG tablet, Take 10 mg by mouth daily as needed for allergies., Disp: , Rfl:  .  LORazepam (ATIVAN) 1 MG tablet, Take 1 tablet (1 mg total) by mouth 3 (three) times daily as needed. (Patient taking differently: Take 1 mg by mouth as directed. Only for flying), Disp: 10 tablet, Rfl: 1 .  ramipril (ALTACE) 10 MG capsule, TAKE 1 CAPSULE BY MOUTH  DAILY, Disp: 90 capsule, Rfl: 4 .  zolpidem (AMBIEN) 10 MG tablet, Take 0.5-1 tablets (5-10 mg total) by mouth at bedtime as needed for sleep., Disp: 15 tablet, Rfl: 5  Patient Care Team: Birdie Sons, MD as PCP - General (Family Medicine) Oh, Lupita Dawn, MD (Inactive) as Consulting Physician (Gastroenterology) Jalene Mullet, MD as Consulting Physician (Ophthalmology) Garvin Fila, MD as Consulting Physician (Neurology) Frann Rider, NP as Nurse Practitioner (Neurology) Luanne Bras, MD as Consulting Physician (Interventional Radiology)    Objective:    Vitals: BP 136/68 (BP Location: Left Arm, Patient Position: Sitting, Cuff Size: Large)   Pulse 62   Temp (!) 96.8 F (36 C) (Temporal)   Resp 16    Ht 5\' 8"  (1.727 m)   Wt 189 lb (85.7 kg)   SpO2 98% Comment: room air  BMI 28.74 kg/m   Physical Exam  Activities of Daily Living In your present state of health, do you have any difficulty performing the following activities: 12/11/2018  Hearing? N  Vision? Y  Difficulty concentrating or making decisions? N  Walking or climbing stairs? N  Dressing or bathing? N  Doing errands, shopping? N  Some recent data might be hidden    Fall Risk Assessment Fall Risk  12/11/2018 12/01/2017 11/24/2017 11/30/2016 11/26/2015  Falls in the past year? 0 No No No No  Number falls in past yr: 0 - - - -  Injury with Fall? 0 - - - -  Follow  up Falls evaluation completed - - - -     Depression Screen PHQ 2/9 Scores 12/11/2018 12/01/2017 11/30/2016 11/26/2015  PHQ - 2 Score 0 0 0 0  PHQ- 9 Score - 2 2 0    No flowsheet data found.    Assessment & Plan:     Annual Wellness Visit  Reviewed patient's Family Medical History Reviewed and updated list of patient's medical providers Assessment of cognitive impairment was done Assessed patient's functional ability Established a written schedule for health screening [place] Completed and Reviewed  Exercise Activities and Dietary recommendations Goals   None     Immunization History  Administered Date(s) Administered  . Influenza, High Dose Seasonal PF 11/30/2016, 02/12/2018  . Pneumococcal Conjugate-13 10/23/2013  . Pneumococcal Polysaccharide-23 10/27/2014  . Td 11/26/2015  . Tdap 06/20/2005  . Zoster 09/02/2009    Health Maintenance  Topic Date Due  . INFLUENZA VACCINE  11/10/2018  . TETANUS/TDAP  11/25/2025  . COLONOSCOPY  03/16/2026  . Hepatitis C Screening  Completed  . PNA vac Low Risk Adult  Completed     Discussed health benefits of physical activity, and encouraged him to engage in regular exercise appropriate for his age and condition.     ------------------------------------------------------------------------------------------------------------    Lelon Huh, MD  [place]

## 2018-12-11 NOTE — Patient Instructions (Signed)
.   Please review the attached list of medications and notify my office if there are any errors.   . Please bring all of your medications to every appointment so we can make sure that our medication list is the same as yours.   . It is especially important to get the annual flu vaccine this year. If you haven't had it already, please go to your pharmacy or call the office as soon as possible to schedule you flu shot.  

## 2018-12-11 NOTE — Progress Notes (Signed)
Patient: Garrett Green, Male    DOB: 1945/11/19, 73 y.o.   MRN: [id number] Visit Date: 12/11/2018  Today's Provider: Lelon Huh, MD   Chief Complaint  Patient presents with  . Medicare Wellness  . Annual Exam   Subjective:     Annual physical exam Garrett Green is a 73 y.o. male who presents today for health maintenance and complete physical. He feels fairly well. He reports exercising rarely. He reports he is sleeping fairly well.  ----------------------------------------------------------------- He does complain of muscle aching and fatigue and is concerned it may be related to statin use.    Social History      He  reports that he quit smoking about 30 years ago. He has a 25.00 pack-year smoking history. He has never used smokeless tobacco. He reports current alcohol use of about 1.0 standard drinks of alcohol per week. He reports that he does not use drugs.       Social History   Socioeconomic History  . Marital status: Married    Spouse name: Not on file  . Number of children: Not on file  . Years of education: Not on file  . Highest education level: Not on file  Occupational History    Employer: RETIRED  Social Needs  . Financial resource strain: Not on file  . Food insecurity    Worry: Not on file    Inability: Not on file  . Transportation needs    Medical: Not on file    Non-medical: Not on file  Tobacco Use  . Smoking status: Former Smoker    Packs/day: 1.00    Years: 25.00    Pack years: 25.00    Quit date: 03/03/1988    Years since quitting: 30.7  . Smokeless tobacco: Never Used  Substance and Sexual Activity  . Alcohol use: Yes    Alcohol/week: 1.0 standard drinks    Types: 1 Standard drinks or equivalent per week  . Drug use: Never  . Sexual activity: Yes  Lifestyle  . Physical activity    Days per week: Not on file    Minutes per session: Not on file  . Stress: Not on file  Relationships  . Social Herbalist on  phone: Not on file    Gets together: Not on file    Attends religious service: Not on file    Active member of club or organization: Not on file    Attends meetings of clubs or organizations: Not on file    Relationship status: Not on file  Other Topics Concern  . Not on file  Social History Narrative  . Not on file    Past Medical History:  Diagnosis Date  . Arthritis    "lower back" (10/19/2017)  . GERD (gastroesophageal reflux disease)   . High cholesterol 10/19/2017  . History of blood transfusion 2014   "related to back OR"  . History of chicken pox   . Hypertension    stress test- 8-9 yrs. ago, wnl, [facility]  . Stroke ([place])   . TIA (transient ischemic attack) 10/17/2017; 10/19/2017     Patient Active Problem List   Diagnosis Date Noted  . Glaucoma 12/01/2017  . 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 Hyperlipidemia 12/01/2017  . Cerebral artery disease 11/07/2017  . 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 Mild concentric left ventricular hypertrophy (LVH) 10/19/2017  . Prediabetes 11/30/2016  . Spondylosis 10/22/2014  .  Dysphagia 10/22/2014  . Insomnia 10/22/2014  . Fam hx-ischem heart disease 08/13/2007  . 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 Arthralgia of hip or thigh 09/26/2006  . Pain in hip 09/26/2006  . Allergic rhinitis 09/17/2001  . GERD (gastroesophageal reflux disease) 11/14/1994  . Essential (primary) hypertension 11/14/1994    Past Surgical History:  Procedure Laterality Date  . BACK SURGERY    . CATARACT EXTRACTION W/ INTRAOCULAR LENS IMPLANT Left   . COLONOSCOPY WITH PROPOFOL N/A 03/16/2016   Procedure: COLONOSCOPY WITH PROPOFOL;  Surgeon: Manya Silvas, MD;  Location: [facility] ENDOSCOPY;  Service: Endoscopy;  Laterality: N/A;  . ESOPHAGOGASTRODUODENOSCOPY (EGD) WITH ESOPHAGEAL DILATION  1990s  . IR ANGIO INTRA EXTRACRAN SEL COM CAROTID INNOMINATE BILAT MOD SED  10/31/2017  . IR ANGIO VERTEBRAL SEL VERTEBRAL BILAT MOD SED  10/31/2017  .  LUMBAR LAMINECTOMY/DECOMPRESSION MICRODISCECTOMY Left 07/26/2012   Procedure: LUMBAR LAMINECTOMY/DECOMPRESSION MICRODISCECTOMY 1 LEVEL;  Surgeon: Eustace Moore, MD;  Location: [place] NEURO ORS;  Service: Neurosurgery;  Laterality: Left;  lumbar five sacral one  . SHOULDER ARTHROSCOPY WITH OPEN ROTATOR CUFF REPAIR Left 1990s  . TONSILLECTOMY      Family History        Family Status  Relation Name Status  . Mother  Deceased  . Father  Deceased  . Sister  Deceased  . Brother  Deceased  . Sister  Deceased  . Sister  Deceased  . Brother  Alive  . Sister  Alive        His family history includes Cancer in his sister; Dementia in his brother and sister; Diabetes in his brother and sister; Heart disease in his mother.      Allergies  Allergen Reactions  . Sulfa Antibiotics Other (See Comments)    Lethargic and hypotension     Current Outpatient Medications:  .  aspirin 325 MG tablet, Take 325 mg by mouth daily., Disp: , Rfl:  .  atorvastatin (LIPITOR) 40 MG tablet, TAKE 1 TABLET (40 MG TOTAL) BY MOUTH DAILY AT 6 PM., Disp: 90 tablet, Rfl: 4 .  brimonidine (ALPHAGAN P) 0.1 % SOLN, Place 1 drop into the left eye 2 (two) times daily., Disp: , Rfl:  .  clopidogrel (PLAVIX) 75 MG tablet, TAKE 1 TABLET BY MOUTH EVERY DAY, Disp: 90 tablet, Rfl: 4 .  fluticasone (FLONASE) 50 MCG/ACT nasal spray, Place 2 sprays into both nostrils daily as needed for allergies., Disp: 48 g, Rfl: 1 .  HYDROcodone-acetaminophen (NORCO/VICODIN) 5-325 MG tablet, Take 1 tablet by mouth every 6 (six) hours as needed., Disp: 120 tablet, Rfl: 0 .  latanoprost (XALATAN) 0.005 % ophthalmic solution, Place 1 drop into both eyes at bedtime., Disp: , Rfl:  .  loratadine (CLARITIN) 10 MG tablet, Take 10 mg by mouth daily as needed for allergies., Disp: , Rfl:  .  LORazepam (ATIVAN) 1 MG tablet, Take 1 tablet (1 mg total) by mouth 3 (three) times daily as needed. (Patient taking differently: Take 1 mg by mouth as directed. Only for  flying), Disp: 10 tablet, Rfl: 1 .  ramipril (ALTACE) 10 MG capsule, TAKE 1 CAPSULE BY MOUTH  DAILY, Disp: 90 capsule, Rfl: 4 .  zolpidem (AMBIEN) 10 MG tablet, Take 0.5-1 tablets (5-10 mg total) by mouth at bedtime as needed for sleep., Disp: 90 tablet, Rfl: 1   Patient Care Team: Birdie Sons, MD as PCP - General (Family Medicine) Oh, Lupita Dawn, MD (Inactive) as Consulting Physician (Gastroenterology) Jalene Mullet, MD as Consulting  Physician (Ophthalmology) Garvin Fila, MD as Consulting Physician (Neurology) Frann Rider, NP as Nurse Practitioner (Neurology) Luanne Bras, MD as Consulting Physician (Interventional Radiology)    Objective:    Vitals: BP 136/68 (BP Location: Left Arm, Patient Position: Sitting, Cuff Size: Large)   Pulse 62   Temp (!) 96.8 F (36 C) (Temporal)   Resp 16   Ht 5\' 8"  (1.727 m)   Wt 189 lb (85.7 kg)   SpO2 98% Comment: room air  BMI 28.74 kg/m    Vitals:   12/11/18 1007  BP: 136/68  Pulse: 62  Resp: 16  Temp: (!) 96.8 F (36 C)  TempSrc: Temporal  SpO2: 98%  Weight: 189 lb (85.7 kg)  Height: 5\' 8"  (1.727 m)     Physical Exam   General Appearance:    Alert, cooperative, no distress, appears stated age  Head:    Normocephalic, without obvious abnormality, atraumatic  Eyes:    PERRL, conjunctiva/corneas clear, EOM's intact, fundi    benign, both eyes       Ears:    Normal TM's and external ear canals, both ears  Nose:   Nares normal, septum midline, mucosa normal, no drainage   or sinus tenderness  Throat:   Lips, mucosa, and tongue normal; teeth and gums normal  Neck:   Supple, symmetrical, trachea midline, no adenopathy;       thyroid:  No enlargement/tenderness/nodules; no carotid   bruit or JVD  Back:     Symmetric, no curvature, ROM normal, no CVA tenderness  Lungs:     Clear to auscultation bilaterally, respirations unlabored  Chest wall:    No tenderness or deformity  Heart:    Normal heart rate. Normal rhythm.  No murmurs, rubs, or gallops.  S1 and S2 normal  Abdomen:     Soft, non-tender, bowel sounds active all four quadrants,    no masses, no organomegaly  Genitalia:    deferred  Rectal:    deferred  Extremities:   All extremities are intact. No cyanosis or edema  Pulses:   2+ and symmetric all extremities  Skin:   Skin color, texture, turgor normal, no rashes or lesions  Lymph nodes:   Cervical, supraclavicular, and axillary nodes normal  Neurologic:   CNII-XII intact. Normal strength, sensation and reflexes      throughout    Depression Screen PHQ 2/9 Scores 12/11/2018 12/01/2017 11/30/2016 11/26/2015  PHQ - 2 Score 0 0 0 0  PHQ- 9 Score - 2 2 0       Assessment & Plan:     Routine Health Maintenance and Physical Exam  Exercise Activities and Dietary recommendations Goals   None     Immunization History  Administered Date(s) Administered  . Fluad Quad(high Dose 65+) 12/11/2018  . Influenza, High Dose Seasonal PF 11/30/2016, 02/12/2018  . Pneumococcal Conjugate-13 10/23/2013  . Pneumococcal Polysaccharide-23 10/27/2014  . Td 11/26/2015  . Tdap 06/20/2005  . Zoster 09/02/2009    Health Maintenance  Topic Date Due  . INFLUENZA VACCINE  11/10/2018  . TETANUS/TDAP  11/25/2025  . COLONOSCOPY  03/16/2026  . Hepatitis C Screening  Completed  . PNA vac Low Risk Adult  Completed     Discussed health benefits of physical activity, and encouraged him to engage in regular exercise appropriate for his age and condition.    --------------------------------------------------------------------  1. Annual physical exam   2. Myalgia Check CK, consider changing stain  3. Hyperlipidemia, unspecified hyperlipidemia type  -  Comprehensive metabolic panel - Lipid panel - CBC - CK (Creatine Kinase)  4. Essential (primary) hypertension Well controlled.  .Continue current medications.   - Comprehensive metabolic panel  5. Late effects of CVA (cerebrovascular accident)  Asymptomatic. Compliant with medication.  Continue aggressive risk factor modification.    6. Prediabetes  - Hemoglobin A1c  7. Insomnia, unspecified type refill- zolpidem (AMBIEN) 10 MG tablet; Take 0.5-1 tablets (5-10 mg total) by mouth at bedtime as needed for sleep.  Dispense: 90 tablet; Refill: 1  8. Prostate cancer screening  - PSA  9. Need for influenza vaccination  - Flu Vaccine QUAD High Dose(Fluad)    Lelon Huh, MD  [facility]

## 2018-12-12 LAB — COMPREHENSIVE METABOLIC PANEL
ALT: 18 IU/L (ref 0–44)
AST: 18 IU/L (ref 0–40)
Albumin/Globulin Ratio: 2 (ref 1.2–2.2)
Albumin: 4.7 g/dL (ref 3.8–4.8)
Alkaline Phosphatase: 74 IU/L (ref 39–117)
BUN/Creatinine Ratio: 12 (ref 10–24)
BUN: 15 mg/dL (ref 8–27)
Bilirubin Total: 0.9 mg/dL (ref 0.0–1.2)
CO2: 22 mmol/L (ref 20–29)
Calcium: 9.7 mg/dL (ref 8.6–10.2)
Chloride: 104 mmol/L (ref 96–106)
Creatinine, Ser: 1.26 mg/dL (ref 0.76–1.27)
GFR calc Af Amer: 66 mL/min/{1.73_m2} (ref >59)
GFR calc non Af Amer: 57 mL/min/{1.73_m2} — ABNORMAL LOW (ref >59)
Globulin, Total: 2.3 g/dL (ref 1.5–4.5)
Glucose: 111 mg/dL — ABNORMAL HIGH (ref 65–99)
Potassium: 5.1 mmol/L (ref 3.5–5.2)
Sodium: 140 mmol/L (ref 134–144)
Total Protein: 7 g/dL (ref 6.0–8.5)

## 2018-12-12 LAB — CBC
Hematocrit: 44 % (ref 37.5–51.0)
Hemoglobin: 14.9 g/dL (ref 13.0–17.7)
MCH: 31.1 pg (ref 26.6–33.0)
MCHC: 33.9 g/dL (ref 31.5–35.7)
MCV: 92 fL (ref 79–97)
Platelets: 205 10*3/uL (ref 150–450)
RBC: 4.79 x10E6/uL (ref 4.14–5.80)
RDW: 11.9 % (ref 11.6–15.4)
WBC: 7.8 10*3/uL (ref 3.4–10.8)

## 2018-12-12 LAB — LIPID PANEL
Chol/HDL Ratio: 2.3 ratio (ref 0.0–5.0)
Cholesterol, Total: 113 mg/dL (ref 100–199)
HDL: 49 mg/dL (ref >39)
LDL Chol Calc (NIH): 48 mg/dL (ref 0–99)
Triglycerides: 81 mg/dL (ref 0–149)
VLDL Cholesterol Cal: 16 mg/dL (ref 5–40)

## 2018-12-12 LAB — CK: Total CK: 91 U/L (ref 41–331)

## 2018-12-12 LAB — PSA: Prostate Specific Ag, Serum: 0.6 ng/mL (ref 0.0–4.0)

## 2018-12-12 LAB — HEMOGLOBIN A1C
Est. average glucose Bld gHb Est-mCnc: 120 mg/dL
Hgb A1c MFr Bld: 5.8 % — ABNORMAL HIGH (ref 4.8–5.6)

## 2018-12-13 ENCOUNTER — Telehealth: Payer: Self-pay

## 2018-12-13 NOTE — Telephone Encounter (Signed)
-----   Message from Birdie Sons, MD sent at 12/13/2018  9:01 AM EDT ----- Labs all normal. Continue current medications.  Check yearly

## 2018-12-13 NOTE — Telephone Encounter (Signed)
Copies made and put up front

## 2018-12-13 NOTE — Telephone Encounter (Signed)
Pt advised.  Pt requested a copy to be left at the front desk.    Thanks,   -Mickel Baas

## 2019-01-02 ENCOUNTER — Telehealth (HOSPITAL_COMMUNITY): Payer: Self-pay

## 2019-01-02 NOTE — Telephone Encounter (Signed)
Called to schedule angio, pt will call to schedule when he is back in town. AW

## 2019-01-03 ENCOUNTER — Other Ambulatory Visit (HOSPITAL_COMMUNITY): Payer: Self-pay | Admitting: Interventional Radiology

## 2019-01-03 DIAGNOSIS — I6381 Other cerebral infarction due to occlusion or stenosis of small artery: Secondary | ICD-10-CM

## 2019-01-03 DIAGNOSIS — I639 Cerebral infarction, unspecified: Secondary | ICD-10-CM

## 2019-01-14 ENCOUNTER — Other Ambulatory Visit: Payer: Self-pay | Admitting: Radiology

## 2019-01-15 ENCOUNTER — Other Ambulatory Visit (HOSPITAL_COMMUNITY): Payer: Self-pay | Admitting: Interventional Radiology

## 2019-01-15 ENCOUNTER — Encounter (HOSPITAL_COMMUNITY): Payer: Self-pay

## 2019-01-15 ENCOUNTER — Other Ambulatory Visit: Payer: Self-pay

## 2019-01-15 ENCOUNTER — Ambulatory Visit (HOSPITAL_COMMUNITY)
Admission: RE | Admit: 2019-01-15 | Discharge: 2019-01-15 | Disposition: A | Discharge status: Home / Self Care | Payer: Medicare Other | Source: Ambulatory Visit | Attending: Interventional Radiology | Admitting: Interventional Radiology

## 2019-01-15 DIAGNOSIS — Z7902 Long term (current) use of antithrombotics/antiplatelets: Secondary | ICD-10-CM | POA: Diagnosis not present

## 2019-01-15 DIAGNOSIS — K219 Gastro-esophageal reflux disease without esophagitis: Secondary | ICD-10-CM | POA: Insufficient documentation

## 2019-01-15 DIAGNOSIS — E7800 Pure hypercholesterolemia, unspecified: Secondary | ICD-10-CM | POA: Diagnosis not present

## 2019-01-15 DIAGNOSIS — Z8249 Family history of ischemic heart disease and other diseases of the circulatory system: Secondary | ICD-10-CM | POA: Diagnosis not present

## 2019-01-15 DIAGNOSIS — I10 Essential (primary) hypertension: Secondary | ICD-10-CM | POA: Insufficient documentation

## 2019-01-15 DIAGNOSIS — Z79899 Other long term (current) drug therapy: Secondary | ICD-10-CM | POA: Diagnosis not present

## 2019-01-15 DIAGNOSIS — I6381 Other cerebral infarction due to occlusion or stenosis of small artery: Secondary | ICD-10-CM

## 2019-01-15 DIAGNOSIS — I639 Cerebral infarction, unspecified: Secondary | ICD-10-CM

## 2019-01-15 DIAGNOSIS — I6601 Occlusion and stenosis of right middle cerebral artery: Secondary | ICD-10-CM | POA: Diagnosis not present

## 2019-01-15 DIAGNOSIS — I69354 Hemiplegia and hemiparesis following cerebral infarction affecting left non-dominant side: Secondary | ICD-10-CM | POA: Insufficient documentation

## 2019-01-15 DIAGNOSIS — Z7982 Long term (current) use of aspirin: Secondary | ICD-10-CM | POA: Diagnosis not present

## 2019-01-15 DIAGNOSIS — Z87891 Personal history of nicotine dependence: Secondary | ICD-10-CM | POA: Diagnosis not present

## 2019-01-15 DIAGNOSIS — Z882 Allergy status to sulfonamides status: Secondary | ICD-10-CM | POA: Insufficient documentation

## 2019-01-15 HISTORY — PX: IR US GUIDE VASC ACCESS RIGHT: IMG2390

## 2019-01-15 HISTORY — PX: IR ANGIO VERTEBRAL SEL VERTEBRAL BILAT MOD SED: IMG5369

## 2019-01-15 HISTORY — PX: IR ANGIO INTRA EXTRACRAN SEL COM CAROTID INNOMINATE BILAT MOD SED: IMG5360

## 2019-01-15 LAB — BASIC METABOLIC PANEL
Anion gap: 9 (ref 5–15)
BUN: 15 mg/dL (ref 8–23)
CO2: 22 mmol/L (ref 22–32)
Calcium: 9 mg/dL (ref 8.9–10.3)
Chloride: 106 mmol/L (ref 98–111)
Creatinine, Ser: 1.12 mg/dL (ref 0.61–1.24)
GFR calc Af Amer: >60 mL/min (ref >60)
GFR calc non Af Amer: >60 mL/min (ref >60)
Glucose, Bld: 119 mg/dL — ABNORMAL HIGH (ref 70–99)
Potassium: 4.3 mmol/L (ref 3.5–5.1)
Sodium: 137 mmol/L (ref 135–145)

## 2019-01-15 LAB — CBC
HCT: 39.3 % (ref 39.0–52.0)
Hemoglobin: 13.3 g/dL (ref 13.0–17.0)
MCH: 31.1 pg (ref 26.0–34.0)
MCHC: 33.8 g/dL (ref 30.0–36.0)
MCV: 91.8 fL (ref 80.0–100.0)
Platelets: 173 10*3/uL (ref 150–400)
RBC: 4.28 MIL/uL (ref 4.22–5.81)
RDW: 11.8 % (ref 11.5–15.5)
WBC: 7.8 10*3/uL (ref 4.0–10.5)
nRBC: 0 % (ref 0.0–0.2)

## 2019-01-15 LAB — PROTIME-INR
INR: 1 (ref 0.8–1.2)
Prothrombin Time: 13.3 seconds (ref 11.4–15.2)

## 2019-01-15 MED ORDER — IOHEXOL 300 MG/ML  SOLN
50.0000 mL | Freq: Once | INTRAMUSCULAR | Status: AC | PRN
  Administered 2019-01-15: 12:00:00 24 mL via INTRA_ARTERIAL

## 2019-01-15 MED ORDER — FENTANYL CITRATE (PF) 100 MCG/2ML IJ SOLN
INTRAMUSCULAR | Status: AC | PRN
  Administered 2019-01-15: 25 ug via INTRAVENOUS

## 2019-01-15 MED ORDER — VERAPAMIL HCL 2.5 MG/ML IV SOLN
INTRAVENOUS | Status: AC
  Administered 2019-01-15: 2.5 mg
  Filled 2019-01-15: qty 2

## 2019-01-15 MED ORDER — MIDAZOLAM HCL 2 MG/2ML IJ SOLN
INTRAMUSCULAR | Status: AC
  Filled 2019-01-15: qty 2

## 2019-01-15 MED ORDER — HEPARIN SODIUM (PORCINE) 1000 UNIT/ML IJ SOLN
INTRAMUSCULAR | Status: AC
  Administered 2019-01-15: 2000 [IU]
  Filled 2019-01-15: qty 1

## 2019-01-15 MED ORDER — LIDOCAINE HCL 1 % IJ SOLN
INTRAMUSCULAR | Status: AC
  Filled 2019-01-15: qty 20

## 2019-01-15 MED ORDER — LIDOCAINE HCL (PF) 1 % IJ SOLN
INTRAMUSCULAR | Status: AC | PRN
  Administered 2019-01-15: 5 mL

## 2019-01-15 MED ORDER — MIDAZOLAM HCL 2 MG/2ML IJ SOLN
INTRAMUSCULAR | Status: AC | PRN
  Administered 2019-01-15: 1 mg via INTRAVENOUS

## 2019-01-15 MED ORDER — NITROGLYCERIN 1 MG/10 ML FOR IR/CATH LAB
INTRA_ARTERIAL | Status: AC
  Administered 2019-01-15: 400 ug
  Filled 2019-01-15: qty 10

## 2019-01-15 MED ORDER — VERAPAMIL HCL 2.5 MG/ML IV SOLN
INTRAVENOUS | Status: AC | PRN
  Administered 2019-01-15: 2.5 mg via INTRA_ARTERIAL

## 2019-01-15 MED ORDER — HEPARIN SODIUM (PORCINE) 1000 UNIT/ML IJ SOLN
INTRAMUSCULAR | Status: AC | PRN
  Administered 2019-01-15: 2000 [IU] via INTRA_ARTERIAL

## 2019-01-15 MED ORDER — SODIUM CHLORIDE 0.9 % IV SOLN
Freq: Once | INTRAVENOUS | Status: AC
  Administered 2019-01-15: 09:00:00 via INTRAVENOUS

## 2019-01-15 MED ORDER — FENTANYL CITRATE (PF) 100 MCG/2ML IJ SOLN
INTRAMUSCULAR | Status: AC
  Filled 2019-01-15: qty 2

## 2019-01-15 MED ORDER — NITROGLYCERIN 1 MG/10 ML FOR IR/CATH LAB
INTRA_ARTERIAL | Status: AC | PRN
  Administered 2019-01-15: 200 ug via INTRA_ARTERIAL

## 2019-01-15 MED ORDER — SODIUM CHLORIDE 0.9 % IV SOLN: INTRAVENOUS | Status: AC

## 2019-01-15 MED ORDER — IOHEXOL 300 MG/ML  SOLN
150.0000 mL | Freq: Once | INTRAMUSCULAR | Status: AC | PRN
  Administered 2019-01-15: 12:00:00 90 mL via INTRA_ARTERIAL

## 2019-01-15 NOTE — Procedures (Addendum)
S/P 4 vessel cerebral arteriogram RT CFA approach. 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 2. Approx 50 % stenosis of the Lt MCA M 1 seg. S.Shakeisha Horine MD

## 2019-01-15 NOTE — Progress Notes (Signed)
Discharge instructions reviewed with pt and his wife (via telephone) both voice understanding.  

## 2019-01-15 NOTE — Discharge Instructions (Signed)
Radial Site Care ° °This sheet gives you information about how to care for yourself after your procedure. Your health care provider may also give you more specific instructions. If you have problems or questions, contact your health care provider. °What can I expect after the procedure? °After the procedure, it is common to have: °· Bruising and tenderness at the catheter insertion area. °Follow these instructions at home: °Medicines °· Take over-the-counter and prescription medicines only as told by your health care provider. °Insertion site care °· Follow instructions from your health care provider about how to take care of your insertion site. Make sure you: °? Wash your hands with soap and water before you change your bandage (dressing). If soap and water are not available, use hand sanitizer. °? Change your dressing as told by your health care provider. °? Leave stitches (sutures), skin glue, or adhesive strips in place. These skin closures may need to stay in place for 2 weeks or longer. If adhesive strip edges start to loosen and curl up, you may trim the loose edges. Do not remove adhesive strips completely unless your health care provider tells you to do that. °· Check your insertion site every day for signs of infection. Check for: °? Redness, swelling, or pain. °? Fluid or blood. °? Pus or a bad smell. °? Warmth. °· Do not take baths, swim, or use a hot tub until your health care provider approves. °· You may shower 24-48 hours after the procedure, or as directed by your health care provider. °? Remove the dressing and gently wash the site with plain soap and water. °? Pat the area dry with a clean towel. °? Do not rub the site. That could cause bleeding. °· Do not apply powder or lotion to the site. °Activity ° °· For 24 hours after the procedure, or as directed by your health care provider: °? Do not flex or bend the affected arm. °? Do not push or pull heavy objects with the affected arm. °? Do not  drive yourself home from the hospital or clinic. You may drive 24 hours after the procedure unless your health care provider tells you not to. °? Do not operate machinery or power tools. °· Do not lift anything that is heavier than 10 lb (4.5 kg), or the limit that you are told, until your health care provider says that it is safe. °· Ask your health care provider when it is okay to: °? Return to work or school. °? Resume usual physical activities or sports. °? Resume sexual activity. °General instructions °· If the catheter site starts to bleed, raise your arm and put firm pressure on the site. If the bleeding does not stop, get help right away. This is a medical emergency. °· If you went home on the same day as your procedure, a responsible adult should be with you for the first 24 hours after you arrive home. °· Keep all follow-up visits as told by your health care provider. This is important. °Contact a health care provider if: °· You have a fever. °· You have redness, swelling, or yellow drainage around your insertion site. °Get help right away if: °· You have unusual pain at the radial site. °· The catheter insertion area swells very fast. °· The insertion area is bleeding, and the bleeding does not stop when you hold steady pressure on the area. °· Your arm or hand becomes pale, cool, tingly, or numb. °These symptoms may represent a serious problem   that is an emergency. Do not wait to see if the symptoms will go away. Get medical help right away. Call your local emergency services (911 in the [place] Do not drive yourself to the hospital. °Summary °· After the procedure, it is common to have bruising and tenderness at the site. °· Follow instructions from your health care provider about how to take care of your radial site wound. Check the wound every day for signs of infection. °· Do not lift anything that is heavier than 10 lb (4.5 kg), or the limit that you are told, until your health care provider says  that it is safe. °This information is not intended to replace advice given to you by your health care provider. Make sure you discuss any questions you have with your health care provider. °Document Released: 04/30/2010 Document Revised: 05/03/2017 Document Reviewed: 05/03/2017 °Elsevier Patient Education © 2020 [employer] ° °

## 2019-01-15 NOTE — Sedation Documentation (Signed)
TR band applied @1142  with 13cc of air.

## 2019-01-15 NOTE — Sedation Documentation (Signed)
Nasal canula/CO2 removed per Dr. Estanislado Pandy request.

## 2019-01-15 NOTE — H&P (Signed)
Chief Complaint: Patient was seen in consultation today for Cerebral arteriogram at the request of Dr Leonie Man  Supervising Physician: Luanne Bras  Patient Status: [facility] - Out-pt  History of Present Illness: Garrett Green is a 73 y.o. male   GERD; High cholesterol; HTN  CVA 10/2017 Arteriogram 10/2017: IMPRESSION: Approximately 60-70% stenosis of the right middle cerebral artery M1 segment, and approximately 50-60% stenosis of the left middle cerebral artery M1 segment secondary to atherosclerotic disease. Severe focal stenosis of the right posterior-inferior cerebral artery in its proximal 1/3, and a moderately severe stenosis of the left posterior-inferior cerebral artery in its proximal 1/3. High-grade arteriosclerotic stenosis of the right posterior cerebral artery of the distal P2 segment. Moderate arteriosclerotic stenosis of the distal left posterior cerebral artery P1 segment.   Still with Left sided weakness on occasion-- especially if tired No other symptoms Taking Plavix/ASA daily  Follow up MRA 11/23/18: IMPRESSION: 1. No acute intracranial abnormality. 2. Chronic right thalamic lacunar infarct. 3. 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  Scheduled now for arteriogram for further evaluation of stenosis   Past Medical History:  Diagnosis Date  . Arthritis    "lower back" (10/19/2017)  . GERD (gastroesophageal reflux disease)   . High cholesterol 10/19/2017  . History of blood transfusion 2014   "related to back OR"  . History of chicken pox   . Hypertension    stress test- 8-9 yrs. ago, [facility], [facility]  . Stroke ([place])   . TIA (transient ischemic attack) 10/17/2017; 10/19/2017    Past Surgical History:  Procedure Laterality Date  . BACK SURGERY    . CATARACT EXTRACTION W/ INTRAOCULAR LENS IMPLANT Left   . COLONOSCOPY WITH PROPOFOL N/A 03/16/2016   Procedure: COLONOSCOPY WITH PROPOFOL;  Surgeon:  Manya Silvas, MD;  [facility];  Service: Endoscopy;  Laterality: N/A;  . ESOPHAGOGASTRODUODENOSCOPY (EGD) WITH ESOPHAGEAL DILATION  1990s  . IR ANGIO INTRA EXTRACRAN SEL COM CAROTID INNOMINATE BILAT MOD SED  10/31/2017  . IR ANGIO VERTEBRAL SEL VERTEBRAL BILAT MOD SED  10/31/2017  . LUMBAR LAMINECTOMY/DECOMPRESSION MICRODISCECTOMY Left 07/26/2012   Procedure: LUMBAR LAMINECTOMY/DECOMPRESSION MICRODISCECTOMY 1 LEVEL;  Surgeon: Eustace Moore, MD;  Location: [place] NEURO ORS;  Service: Neurosurgery;  Laterality: Left;  lumbar five sacral one  . SHOULDER ARTHROSCOPY WITH OPEN ROTATOR CUFF REPAIR Left 1990s  . TONSILLECTOMY      Allergies: Sulfa antibiotics  Medications: Prior to Admission medications   Medication Sig Start Date End Date Taking? Authorizing Provider  aspirin 325 MG tablet Take 325 mg by mouth daily.   Yes [provider]  atorvastatin (LIPITOR) 40 MG tablet TAKE 1 TABLET (40 MG TOTAL) BY MOUTH DAILY AT 6 PM. 07/05/18  Yes Birdie Sons, MD  brimonidine (ALPHAGAN P) 0.1 % SOLN Place 1 drop into the left eye 2 (two) times daily.   Yes [provider]  clopidogrel (PLAVIX) 75 MG tablet TAKE 1 TABLET BY MOUTH EVERY DAY 07/05/18  Yes Birdie Sons, MD  fluticasone [facility]) 50 MCG/ACT nasal spray Place 2 sprays into both nostrils daily as needed for allergies. 11/30/16  Yes Birdie Sons, MD  HYDROcodone-acetaminophen (NORCO/VICODIN) 5-325 MG tablet Take 1 tablet by mouth every 6 (six) hours as needed. 10/16/18  Yes Birdie Sons, MD  latanoprost (XALATAN) 0.005 % ophthalmic solution Place 1 drop into both eyes at bedtime.   Yes [provider]  ramipril (ALTACE) 10 MG capsule TAKE 1 CAPSULE  BY MOUTH  DAILY 08/13/18  Yes Birdie Sons, MD  zolpidem (AMBIEN) 10 MG tablet Take 0.5-1 tablets (5-10 mg total) by mouth at bedtime as needed for sleep. 12/11/18  Yes Birdie Sons, MD  loratadine (CLARITIN) 10 MG tablet Take 10 mg by mouth daily  as needed for allergies.    [provider]  LORazepam (ATIVAN) 1 MG tablet Take 1 tablet (1 mg total) by mouth 3 (three) times daily as needed. Patient taking differently: Take 1 mg by mouth as directed. Only for flying 11/30/16   Birdie Sons, MD     Family History  Problem Relation Age of Onset  . Heart disease Mother   . Cancer Sister   . Dementia Brother   . Dementia Sister   . Diabetes Brother   . Diabetes Sister     Social History   Socioeconomic History  . Marital status: Married    Spouse name: Not on file  . Number of children: Not on file  . Years of education: Not on file  . Highest education level: Not on file  Occupational History    Employer: RETIRED  Social Needs  . Financial resource strain: Not on file  . Food insecurity    Worry: Not on file    Inability: Not on file  . Transportation needs    Medical: Not on file    Non-medical: Not on file  Tobacco Use  . Smoking status: Former Smoker    Packs/day: 1.00    Years: 25.00    Pack years: 25.00    Quit date: 03/03/1988    Years since quitting: 30.8  . Smokeless tobacco: Never Used  Substance and Sexual Activity  . Alcohol use: Yes    Alcohol/week: 1.0 standard drinks    Types: 1 Standard drinks or equivalent per week  . Drug use: Never  . Sexual activity: Yes  Lifestyle  . Physical activity    Days per week: Not on file    Minutes per session: Not on file  . Stress: Not on file  Relationships  . Social Herbalist on phone: Not on file    Gets together: Not on file    Attends religious service: Not on file    Active member of club or organization: Not on file    Attends meetings of clubs or organizations: Not on file    Relationship status: Not on file  Other Topics Concern  . Not on file  Social History Narrative  . Not on file    Review of Systems: A 12 point ROS discussed and pertinent positives are indicated in the HPI above.  All other systems are negative.   Review of Systems  Constitutional: Negative for activity change, fatigue and fever.  HENT: Negative for tinnitus and trouble swallowing.   Eyes: Negative for visual disturbance.  Respiratory: Negative for cough and shortness of breath.   Cardiovascular: Negative for chest pain.  Gastrointestinal: Negative for abdominal pain.  Musculoskeletal: Negative for back pain and gait problem.  Neurological: Positive for weakness. Negative for dizziness, tremors, seizures, syncope, facial asymmetry, speech difficulty, light-headedness, numbness and headaches.  Psychiatric/Behavioral: Negative for behavioral problems and confusion.    Vital Signs: BP (!) 166/77   Pulse (!) 50   Temp (!) 97.4 F (36.3 C) (Oral)   Resp 16   Ht 5\' 8"  (1.727 m)   Wt 190 lb (86.2 kg)   SpO2 100%   BMI 28.89  kg/m   Physical Exam Vitals signs reviewed.  Constitutional:      Appearance: Normal appearance.  HENT:     Head: Atraumatic.     Mouth/Throat:     Mouth: Mucous membranes are moist.  Eyes:     Extraocular Movements: Extraocular movements intact.  Cardiovascular:     Rate and Rhythm: Normal rate and regular rhythm.     Heart sounds: Normal heart sounds.  Pulmonary:     Effort: Pulmonary effort is normal.     Breath sounds: Normal breath sounds.  Abdominal:     Palpations: Abdomen is soft.  Musculoskeletal: Normal range of motion.     Right lower leg: No edema.     Left lower leg: No edema.  Skin:    General: Skin is warm and dry.  Neurological:     Mental Status: He is alert and oriented to person, place, and time.  Psychiatric:        Mood and Affect: Mood normal.        Behavior: Behavior normal.        Thought Content: Thought content normal.        Judgment: Judgment normal.     Imaging: No results found.  Labs:  CBC: Recent Labs    05/16/18 1159 12/11/18 1059  WBC 8.6 7.8  HGB 14.4 14.9  HCT 43.7 44.0  PLT 174 205    COAGS: Recent Labs    05/16/18 1159  INR 0.97   APTT 28    BMP: Recent Labs    05/11/18 1127 05/16/18 1159 05/16/18 1209 11/23/18 1225 12/11/18 1059  NA  --  139  --   --  140  K  --  5.1  --   --  5.1  CL  --  106  --   --  104  CO2  --  24  --   --  22  GLUCOSE  --  139*  --   --  111*  BUN  --  16  --   --  15  CALCIUM  --  9.5  --   --  9.7  CREATININE 1.06 1.12 1.10 1.41* 1.26  GFRNONAA >60 >60  --  50* 57*  GFRAA >60 >60  --  58* 66    LIVER FUNCTION TESTS: Recent Labs    05/16/18 1159 12/11/18 1059  BILITOT 1.1 0.9  AST 21 18  ALT 24 18  ALKPHOS 69 74  PROT 6.6 7.0  ALBUMIN 4.0 4.7    TUMOR MARKERS: No results for input(s): AFPTM, CEA, CA199, CHROMGRNA in the last 8760 hours.  Assessment and Plan:  CVA 10/2017 Follows with Dr Estanislado Pandy regarding cerebral artery stenosis Recent MRA revealing Rt M1 and B PCA stenosis Scheduled for cerebral arteriogram Risks and benefits of cerebral angiogram with intervention were discussed with the patient including, but not limited to bleeding, infection, vascular injury, contrast induced renal failure, stroke or even death.  This interventional procedure involves the use of X-rays and because of the nature of the planned procedure, it is possible that we will have prolonged use of X-ray fluoroscopy.  Potential radiation risks to you include (but are not limited to) the following: - A slightly elevated risk for cancer  several years later in life. This risk is typically less than 0.5% percent. This risk is low in comparison to the normal incidence of human cancer, which is 33% for women and 50% for men according to the American Cancer  Society. - Radiation induced injury can include skin redness, resembling a rash, tissue breakdown / ulcers and hair loss (which can be temporary or permanent).   The likelihood of either of these occurring depends on the difficulty of the procedure and whether you are sensitive to radiation due to previous procedures, disease, or genetic  conditions.   IF your procedure requires a prolonged use of radiation, you will be notified and given written instructions for further action.  It is your responsibility to monitor the irradiated area for the 2 weeks following the procedure and to notify your physician if you are concerned that you have suffered a radiation induced injury.    All of the patient's questions were answered, patient is agreeable to proceed.  Consent signed and in chart.  Thank you for this interesting consult.  I greatly enjoyed meeting GRIFFYN REINBOLD and look forward to participating in their care.  A copy of this report was sent to the requesting provider on this date.  Electronically Signed: Lavonia Drafts, PA-C 01/15/2019, 8:45 AM   I spent a total of    25 Minutes in face to face in clinical consultation, greater than 50% of which was counseling/coordinating care for cerebral arteriogram

## 2019-01-16 ENCOUNTER — Encounter (HOSPITAL_COMMUNITY): Payer: Self-pay | Admitting: Interventional Radiology

## 2019-02-28 ENCOUNTER — Other Ambulatory Visit: Payer: Self-pay | Admitting: Family Medicine

## 2019-02-28 DIAGNOSIS — M47816 Spondylosis without myelopathy or radiculopathy, lumbar region: Secondary | ICD-10-CM

## 2019-02-28 NOTE — Telephone Encounter (Signed)
HYDROcodone-acetaminophen (NORCO/VICODIN) 5-325 MG tablet    Patient is requesting refill.     Pharmacy:  [id number] [place], [place] [address] [phone] (Phone) [phone] (Fax)

## 2019-03-01 MED ORDER — HYDROCODONE-ACETAMINOPHEN 5-325 MG PO TABS: 1.0000 | ORAL_TABLET | Freq: Four times a day (QID) | ORAL | 0 refills | Status: DC | PRN

## 2019-04-16 IMAGING — XA IR ANGIO INTRA EXTRACRAN SEL COM CAROTID INNOMINATE BILAT MOD SE
2 series · 2 of 2 positions shown · IV contrast (IODINE)
Comparison: MRI MRA of the brain of 10/17/2017.

CLINICAL DATA: Recent history of left-sided paresthesias with
weakness. Discovery of a right cerebral hemispheric subcortical
ischemic stroke, and bilateral high-grade stenosis by MRA
examination of the brain.

EXAM:
IR ANGIO VERTEBRAL SEL VERTEBRAL BILAT MOD SED; BILATERAL COMMON
CAROTID AND INNOMINATE ANGIOGRAPHY
TECHNIQUE: Informed written consent was obtained from the patient after a
thorough discussion of the procedural risks, benefits and
alternatives. All questions were addressed. Maximal Sterile Barrier
Technique was utilized including caps, mask, sterile gowns, sterile
gloves, sterile drape, hand hygiene and skin antiseptic. A timeout
was performed prior to the initiation of the procedure.

[Series 4: cerebral · 1 of 1 slices shown (1 of 2)]
[im 1/1]
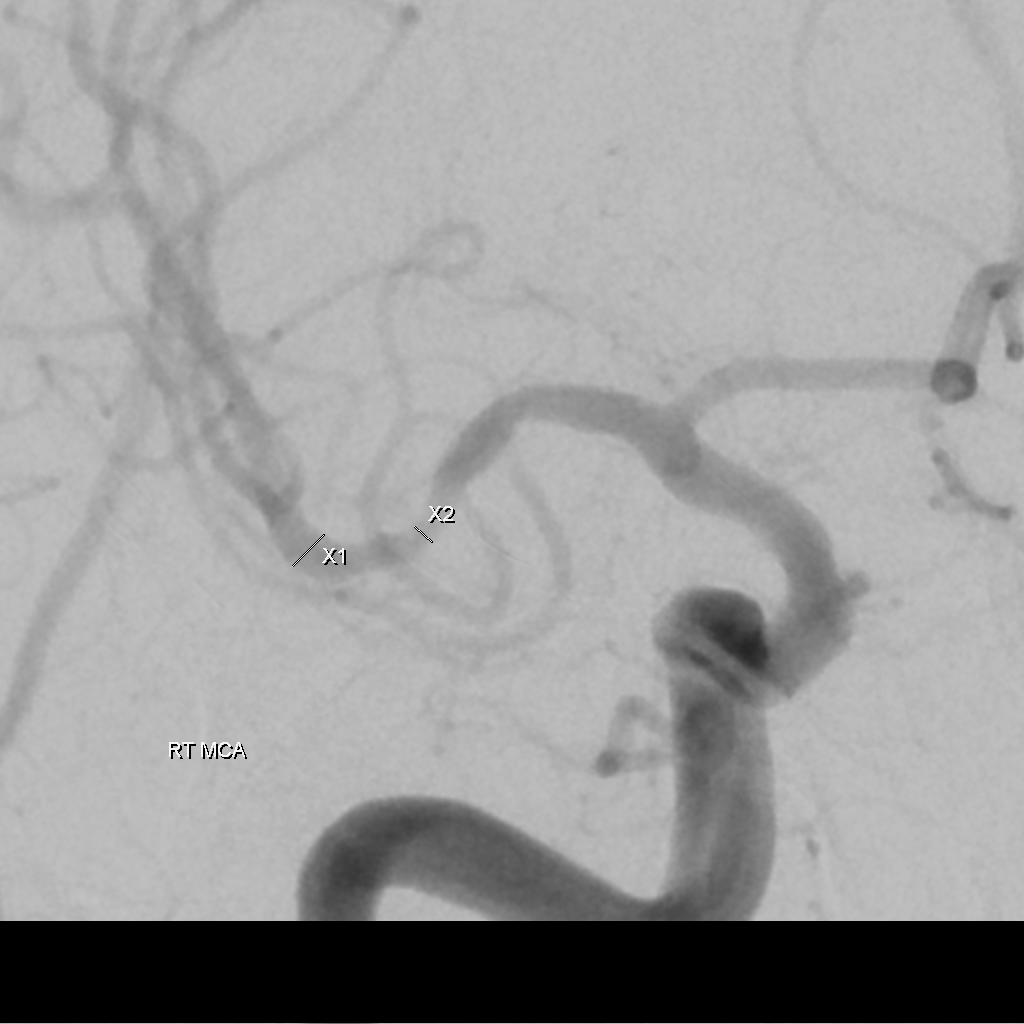

[Series 13: cerebral · 1 of 1 slices shown (2 of 2)]
[im 1/1]
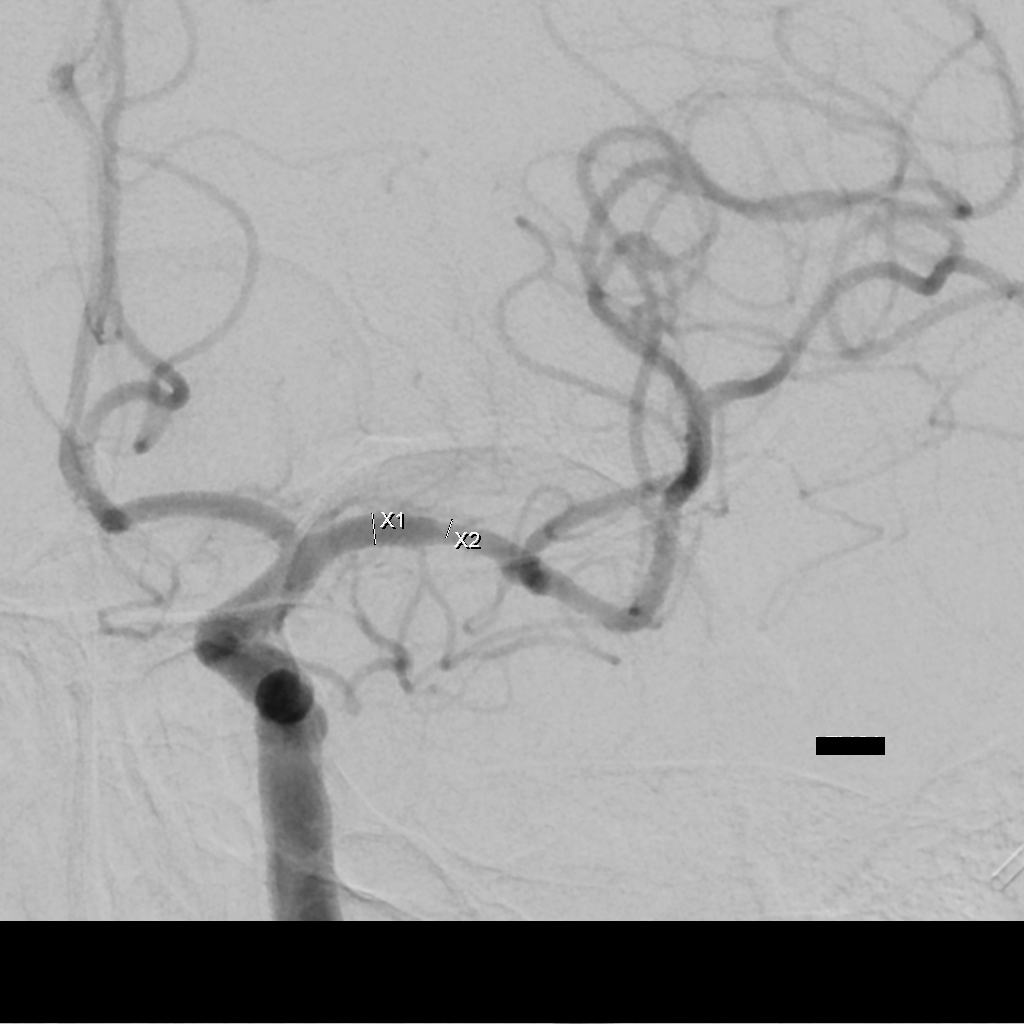

[2 of 2 positions shown; findings below may reference images not displayed]

MEDICATIONS:
Heparin 1111 units IV; no antibiotic was administered within 1 hour
of the procedure

ANESTHESIA/SEDATION:
Versed 1 mg IV; Fentanyl 25 mcg IV.

Moderate Sedation Time:  33 minutes.

The patient was continuously monitored during the procedure by the
interventional radiology nurse under my direct supervision.

CONTRAST:  Isovue 300 approximately 60 mL.

FLUOROSCOPY TIME:  Fluoroscopy Time: 11 minutes 42 seconds (886
mGy).

COMPLICATIONS:
No none immediate.  Ne immediate.
The right groin was prepped and draped in the usual sterile fashion.
Thereafter using modified Seldinger technique, transfemoral access
into the right common femoral artery was obtained without
difficulty. Over a 0.035 inch guidewire, a 5 French Pinnacle sheath
was inserted. Through this, and also over 0.035 inch guidewire, a 5
French JB 1 catheter was advanced to the aortic arch region and
selectively positioned in the right common carotid artery, the right
vertebral artery, the left common carotid artery and the left
vertebral artery.
FINDINGS: The right common carotid arteriogram demonstrates the right external
carotid artery and its major branches to be widely patent.

The right internal carotid artery just distal to the bulb has a thin
sliver of normal calcific smooth plaque without significant stenosis
by the NASCET criteria.

The right internal carotid artery is seen to opacify normally to the
cranial skull base.

The petrous, cavernous and the supraclinoid segments are widely
patent.

The right middle cerebral artery demonstrates an approximately
60-70% stenosis of the right M1 segment. The distal branches are
normally opacified.

The right anterior cerebral artery is seen to opacify into the
capillary and venous phases.

The origin of the right vertebral artery is normal.

The vessel is seen to opacify normally to the cranial skull base.
Wide patency is seen of the right vertebrobasilar junction and the
right posterior-inferior cerebellar artery.

There is a focal area of moderately severe stenosis of the proximal
[DATE] of the right posterior-inferior cerebellar artery.

The opacified portion of the basilar artery, the proximal posterior
cerebral arteries, the superior cerebellar arteries and the
anterior-inferior cerebellar arteries is normal into the capillary
and venous phases.

There is a high-grade stenosis of the right posterior cerebral
artery P2 segment. Mild stenosis of the distal P1 segment is also
noted.

Inflow of unopacified blood is seen in the basilar artery from the
contralateral vertebral artery.

The left vertebral artery origin is widely patent.

The vessel demonstrates mild tortuosity distal to its origin.

More distally, the vessel is seen to opacify normally to the cranial
skull base.

The left vertebrobasilar junction and the left posterior-inferior
cerebellar artery are patent with moderate stenosis of the proximal
[DATE] of the left posterior-inferior cerebellar artery.

The basilar artery, the posterior cerebral arteries proximally, the
superior cerebellar arteries and the anterior-inferior cerebellar
arteries opacify into the capillary and venous phases. The left
common carotid arteriogram demonstrates the left external carotid
artery and its major branches to be widely patent.

The left internal carotid artery at the bulb to the cranial skull
base demonstrates wide patency.

The petrous segment is widely patent.

The paraophthalmic portion of the left internal carotid artery
demonstrates approximately 50% stenosis secondary to a
circumferential plaque.

The distal supraclinoid segment is widely patent.

The left middle cerebral artery demonstrates approximately 50-60%
stenosis of the left M1 segment.

The left MCA trifurcation branches, otherwise, are widely patent.

The left anterior cerebral artery opacifies normally into the
capillary and venous phases.
IMPRESSION: Approximately 60-70% stenosis of the right middle cerebral artery M1
segment, and approximately 50-60% stenosis of the left middle
cerebral artery M1 segment secondary to atherosclerotic disease.

Severe focal stenosis of the right posterior-inferior cerebral
artery in its proximal [DATE], and a moderately severe stenosis of the
left posterior-inferior cerebral artery in its proximal [DATE].

High-grade arteriosclerotic stenosis of the right posterior cerebral
artery of the distal P2 segment.

Moderate arteriosclerotic stenosis of the distal left posterior
cerebral artery P1 segment.

PLAN:
Angiographic findings were reviewed with the patient and his spouse.

Given the patient has been stable neurologically, it was decided to
continue with conservative management with the patient's present
anti-platelet regimen. The patient does not smoke. The patient has
been advised to continue taking his antihypertensive medications and
cholesterol lowering medications.

A follow-up MRI of the brain and MRA of the brain will be obtained
in 6 months time. Should the patient develop any new neurological
symptoms of speech difficulties, facial droop, motor weakness,
incoordination, or visual difficulties he and his wife were advised
to call 911.

The patient will be seen immediately after the follow-up MRI MRA of
the brain in 6 months time. The patient and his spouse were asked to
call should they have any concerns or questions.

## 2019-05-13 HISTORY — PX: CATARACT EXTRACTION: SUR2

## 2019-06-26 ENCOUNTER — Other Ambulatory Visit: Payer: Self-pay | Admitting: Family Medicine

## 2019-06-26 DIAGNOSIS — M47816 Spondylosis without myelopathy or radiculopathy, lumbar region: Secondary | ICD-10-CM

## 2019-06-26 MED ORDER — HYDROCODONE-ACETAMINOPHEN 5-325 MG PO TABS: 1.0000 | ORAL_TABLET | Freq: Four times a day (QID) | ORAL | 0 refills | Status: DC | PRN

## 2019-06-26 NOTE — Telephone Encounter (Signed)
Requested medication (s) are due for refill today: yes  Requested medication (s) are on the active medication list: yes  Last refill:  02/28/2019  Future visit scheduled: yes 07/08/29  Notes to clinic: not delegated   Requested Prescriptions  Pending Prescriptions Disp Refills   HYDROcodone-acetaminophen (NORCO/VICODIN) 5-325 MG tablet 120 tablet 0    Sig: Take 1 tablet by mouth every 6 (six) hours as needed.      Not Delegated - Analgesics:  Opioid Agonist Combinations Failed - 06/26/2019  1:53 PM      Failed - This refill cannot be delegated      Failed - Urine Drug Screen completed in last 360 days.      Failed - Valid encounter within last 6 months    Recent Outpatient Visits           6 months ago Encounter for Medicare annual wellness exam   [facility] Birdie Sons, MD   1 year ago Encounter for Medicare annual wellness exam   [facility] Birdie Sons, MD   1 year ago Right thalamic infarction [facility])   [facility] Birdie Sons, MD   1 year ago Chest pressure   [place], [place]   2 years ago Annual physical exam   [place], [place]       Future Appointments             In 1 week Fisher, Kirstie Peri, MD [facility], [place]

## 2019-06-26 NOTE — Telephone Encounter (Signed)
Medication Refill - Medication: HYDROcodone-acetaminophen (NORCO/VICODIN) 5-325 MG tablet   Has the patient contacted their pharmacy? No. (Agent: If no, request that the patient contact the pharmacy for the refill.) (Agent: If yes, when and what did the pharmacy advise?)  Preferred Pharmacy (with phone number or street name):  [id number] [place], [place] Phone:  [phone]  Fax:  [phone]       Agent: Please be advised that RX refills may take up to 3 business days. We ask that you follow-up with your pharmacy.

## 2019-07-09 ENCOUNTER — Encounter: Payer: Self-pay | Admitting: Family Medicine

## 2019-07-09 ENCOUNTER — Other Ambulatory Visit: Payer: Self-pay

## 2019-07-09 ENCOUNTER — Ambulatory Visit (INDEPENDENT_AMBULATORY_CARE_PROVIDER_SITE_OTHER): Payer: Medicare Other | Admitting: Family Medicine

## 2019-07-09 VITALS — BP 138/70 | HR 68 | Temp 97.1°F | Resp 16 | Wt 191.0 lb

## 2019-07-09 DIAGNOSIS — R300 Dysuria: Secondary | ICD-10-CM

## 2019-07-09 DIAGNOSIS — I10 Essential (primary) hypertension: Secondary | ICD-10-CM | POA: Diagnosis not present

## 2019-07-09 DIAGNOSIS — E785 Hyperlipidemia, unspecified: Secondary | ICD-10-CM

## 2019-07-09 DIAGNOSIS — R7303 Prediabetes: Secondary | ICD-10-CM

## 2019-07-09 DIAGNOSIS — F5101 Primary insomnia: Secondary | ICD-10-CM

## 2019-07-09 NOTE — Progress Notes (Signed)
Established patient visit      Patient: Garrett Green   DOB: 1945-07-18   74 y.o. Male  MRN: [id number] Visit Date: 07/09/2019  Today's healthcare provider: Lelon Huh, MD  Subjective:    Chief Complaint  Patient presents with  . Hypertension  . Insomnia  . Prediabetes   HPI  Follow up for Insomnia:  The patient was last seen for this 6 months ago. Changes made at last visit include none.  He reports good compliance with treatment. He feels that condition is stable. He is not having side effects.   ------------------------------------------------------------------------------------   Hypertension, follow-up:  BP Readings from Last 3 Encounters:  07/09/19 140/72  01/15/19 (!) 151/74  12/11/18 136/68    He was last seen for hypertension 6 months ago.  BP at that visit was 136/68. Management since that visit includes no changes. He reports good compliance with treatment. He is not having side effects.  He is exercising. He is adherent to low salt diet.   Outside blood pressures are 160-170/60-70. He is experiencing none.  Patient denies chest pain, chest pressure/discomfort, claudication, dyspnea, exertional chest pressure/discomfort, fatigue, irregular heart beat, lower extremity edema, near-syncope, orthopnea, palpitations, paroxysmal nocturnal dyspnea, syncope and tachypnea.   Cardiovascular risk factors include advanced age (older than 60 for men, 34 for women), dyslipidemia, hypertension and male gender.  Use of agents associated with hypertension: NSAIDS.     Weight trend: fluctuating a bit Wt Readings from Last 3 Encounters:  07/09/19 191 lb (86.6 kg)  01/15/19 190 lb (86.2 kg)  12/11/18 189 lb (85.7 kg)    Current diet: not asked  ------------------------------------------------------------------------  Prediabetes, Follow-up:   Lab Results  Component Value Date   HGBA1C 5.8 (H) 12/11/2018   HGBA1C 6.2 (H) 10/18/2017   HGBA1C 6.0 (H)  11/30/2016   GLUCOSE 119 (H) 01/15/2019   GLUCOSE 111 (H) 12/11/2018   GLUCOSE 139 (H) 05/16/2018    Last seen for for this 6 months ago.  Management since that visit includes no changes. Current symptoms include none and have been stable.  Weight trend: fluctuating a bit Prior visit with dietician: no Current diet: well balanced Current exercise: yard work  Pertinent Labs:    Component Value Date/Time   CHOL 113 12/11/2018 1059   TRIG 81 12/11/2018 1059   CHOLHDL 2.3 12/11/2018 1059   CHOLHDL 4.2 10/18/2017 0555   CREATININE 1.12 01/15/2019 0806    Wt Readings from Last 3 Encounters:  07/09/19 191 lb (86.6 kg)  01/15/19 190 lb (86.2 kg)  12/11/18 189 lb (85.7 kg)   --------------------------------------------------------------------------------      Medications: Outpatient Medications Prior to Visit  Medication Sig  . aspirin Take 325 mg by mouth daily.  Marland Kitchen atorvastatin TAKE 1 TABLET (40 MG TOTAL) BY MOUTH DAILY AT 6 PM.  . brimonidine Place 1 drop into the left eye 2 (two) times daily.  . clopidogrel TAKE 1 TABLET BY MOUTH EVERY DAY  . fluticasone Place 2 sprays into both nostrils daily as needed for allergies.  Marland Kitchen HYDROcodone-acetaminophen Take 1 tablet by mouth every 6 (six) hours as needed.  . latanoprost Place 1 drop into both eyes at bedtime.  Marland Kitchen loratadine Take 10 mg by mouth daily as needed for allergies.  Marland Kitchen LORazepam Take 1 tablet (1 mg total) by mouth 3 (three) times daily as needed. (Patient taking differently: Take 1 mg by mouth as directed. Only for flying)  . ramipril TAKE 1 CAPSULE BY MOUTH  DAILY  . zolpidem Take 0.5-1 tablets (5-10 mg total) by mouth at bedtime as needed for sleep.   No facility-administered medications prior to visit.    Review of Systems  Constitutional: Negative for appetite change, chills and fever.  Respiratory: Negative for chest tightness, shortness of breath and wheezing.   Cardiovascular: Negative for chest pain and  palpitations.  Gastrointestinal: Negative for abdominal pain, nausea and vomiting.  Genitourinary: Positive for dysuria.        Objective:    BP 140/72 (BP Location: Left Arm, Cuff Size: Large)   Pulse 68   Temp (!) 97.1 F (36.2 C) (Temporal)   Resp 16   Wt 191 lb (86.6 kg)   SpO2 98% Comment: room air  BMI 29.04 kg/m  Vitals:   07/09/19 0831 07/09/19 0838 07/09/19 0854  BP: 138/68 140/72 138/70  Pulse: 68    Resp: 16    Temp: (!) 97.1 F (36.2 C)    TempSrc: Temporal    SpO2: 98%    Weight: 191 lb (86.6 kg)       Physical Exam    General: Appearance:     Overweight male in no acute distress  Eyes:    PERRL, conjunctiva/corneas clear, EOM's intact       Lungs:     Clear to auscultation bilaterally, respirations unlabored  Heart:    Normal heart rate. Normal rhythm. No murmurs, rubs, or gallops.   MS:   All extremities are intact.   Neurologic:   Awake, alert, oriented x 3. No apparent focal neurological           defect.         No results found for any visits on 07/09/19.    Assessment & Plan:    1. Essential (primary) hypertension Fairly well controlled. Continue current medications.    2. Prediabetes  - Hemoglobin A1c  3. Hyperlipidemia, unspecified hyperlipidemia type He is tolerating atorvastatin well with no adverse effects.   - Comprehensive metabolic panel - Lipid panel  4. Dysuria He is unable to provide urine sample today. States that this usually resolves with increase fluid intake. He was given sample cup to collect sample at home if sx continue.   5. Primary insomnia Doing well with prn zolpidem.    The entirety of the information documented in the History of Present Illness, Review of Systems and Physical Exam were personally obtained by me. Portions of this information were initially documented by Meyer Cory, CMA and reviewed by me for thoroughness and accuracy.     Lelon Huh, MD  [facility] [phone]  (phone) [phone] (fax)  [place]

## 2019-07-10 ENCOUNTER — Telehealth (INDEPENDENT_AMBULATORY_CARE_PROVIDER_SITE_OTHER): Payer: Medicare Other

## 2019-07-10 DIAGNOSIS — R300 Dysuria: Secondary | ICD-10-CM | POA: Diagnosis not present

## 2019-07-10 LAB — COMPREHENSIVE METABOLIC PANEL
ALT: 19 IU/L (ref 0–44)
AST: 21 IU/L (ref 0–40)
Albumin/Globulin Ratio: 1.8 (ref 1.2–2.2)
Albumin: 4.2 g/dL (ref 3.7–4.7)
Alkaline Phosphatase: 90 IU/L (ref 39–117)
BUN/Creatinine Ratio: 21 (ref 10–24)
BUN: 23 mg/dL (ref 8–27)
Bilirubin Total: 0.7 mg/dL (ref 0.0–1.2)
CO2: 19 mmol/L — ABNORMAL LOW (ref 20–29)
Calcium: 9.4 mg/dL (ref 8.6–10.2)
Chloride: 104 mmol/L (ref 96–106)
Creatinine, Ser: 1.11 mg/dL (ref 0.76–1.27)
GFR calc Af Amer: 77 mL/min/{1.73_m2} (ref >59)
GFR calc non Af Amer: 66 mL/min/{1.73_m2} (ref >59)
Globulin, Total: 2.3 g/dL (ref 1.5–4.5)
Glucose: 105 mg/dL — ABNORMAL HIGH (ref 65–99)
Potassium: 4.9 mmol/L (ref 3.5–5.2)
Sodium: 138 mmol/L (ref 134–144)
Total Protein: 6.5 g/dL (ref 6.0–8.5)

## 2019-07-10 LAB — POCT URINALYSIS DIPSTICK
Bilirubin, UA: NEGATIVE
Blood, UA: NEGATIVE
Glucose, UA: NEGATIVE
Ketones, UA: NEGATIVE
Leukocytes, UA: NEGATIVE
Nitrite, UA: NEGATIVE
Protein, UA: NEGATIVE
Spec Grav, UA: 1.02 (ref 1.010–1.025)
Urobilinogen, UA: 0.2 E.U./dL
pH, UA: 6 (ref 5.0–8.0)

## 2019-07-10 LAB — LIPID PANEL
Chol/HDL Ratio: 2.1 ratio (ref 0.0–5.0)
Cholesterol, Total: 107 mg/dL (ref 100–199)
HDL: 52 mg/dL (ref >39)
LDL Chol Calc (NIH): 40 mg/dL (ref 0–99)
Triglycerides: 70 mg/dL (ref 0–149)
VLDL Cholesterol Cal: 15 mg/dL (ref 5–40)

## 2019-07-10 LAB — HEMOGLOBIN A1C
Est. average glucose Bld gHb Est-mCnc: 126 mg/dL
Hgb A1c MFr Bld: 6 % — ABNORMAL HIGH (ref 4.8–5.6)

## 2019-07-10 NOTE — Telephone Encounter (Signed)
Patient was seen in the office yesterday with complaints of Dysuria. Patient was unable to collect a urine sample in the office, so a urine collection cup was sent home with patient. His wife dropped off a urine specimen today. Please review results and advise if we need to do anything with the urine sample.

## 2019-07-11 DIAGNOSIS — U071 COVID-19: Secondary | ICD-10-CM

## 2019-07-11 HISTORY — DX: COVID-19: U07.1

## 2019-07-12 ENCOUNTER — Other Ambulatory Visit: Payer: Self-pay | Admitting: Family Medicine

## 2019-07-12 MED ORDER — ATORVASTATIN CALCIUM 40 MG PO TABS: 40.0000 mg | ORAL_TABLET | Freq: Every day | ORAL | 3 refills | Status: DC

## 2019-07-12 MED ORDER — CLOPIDOGREL BISULFATE 75 MG PO TABS: 75.0000 mg | ORAL_TABLET | Freq: Every day | ORAL | 1 refills | Status: DC

## 2019-07-12 NOTE — Telephone Encounter (Signed)
Copied from [place] [id number]. Topic: Quick Communication - Rx Refill/Question >> Jul 12, 2019  3:45 PM Mcneil, Ja-Kwan wrote: Medication: clopidogrel (PLAVIX) 75 MG tablet and atorvastatin (LIPITOR) 40 MG tablet  Has the patient contacted their pharmacy? yes   Preferred Pharmacy (with phone number or street name): [id number] - [place], [place]  Phone: [phone]   Fax: [phone]  Agent: Please be advised that RX refills may take up to 3 business days. We ask that you follow-up with your pharmacy.

## 2019-08-05 ENCOUNTER — Telehealth (HOSPITAL_COMMUNITY): Payer: Self-pay

## 2019-08-05 ENCOUNTER — Other Ambulatory Visit (HOSPITAL_COMMUNITY): Payer: Self-pay | Admitting: Interventional Radiology

## 2019-08-05 DIAGNOSIS — I639 Cerebral infarction, unspecified: Secondary | ICD-10-CM

## 2019-08-05 NOTE — Telephone Encounter (Signed)
Called to schedule f/u mra, no answer, left vm. AW 

## 2019-08-10 HISTORY — PX: YAG LASER APPLICATION: SHX6189

## 2019-08-15 ENCOUNTER — Other Ambulatory Visit: Payer: Self-pay

## 2019-08-15 ENCOUNTER — Encounter: Payer: Self-pay | Admitting: Dermatology

## 2019-08-15 ENCOUNTER — Ambulatory Visit (INDEPENDENT_AMBULATORY_CARE_PROVIDER_SITE_OTHER): Payer: Medicare Other | Admitting: Dermatology

## 2019-08-15 DIAGNOSIS — L821 Other seborrheic keratosis: Secondary | ICD-10-CM

## 2019-08-15 DIAGNOSIS — L820 Inflamed seborrheic keratosis: Secondary | ICD-10-CM

## 2019-08-15 DIAGNOSIS — Z872 Personal history of diseases of the skin and subcutaneous tissue: Secondary | ICD-10-CM | POA: Diagnosis not present

## 2019-08-15 DIAGNOSIS — L570 Actinic keratosis: Secondary | ICD-10-CM | POA: Diagnosis not present

## 2019-08-15 DIAGNOSIS — L578 Other skin changes due to chronic exposure to nonionizing radiation: Secondary | ICD-10-CM | POA: Diagnosis not present

## 2019-08-15 NOTE — Progress Notes (Signed)
   Follow-Up Visit   Subjective  Garrett Green is a 74 y.o. male who presents for the following: AK follow up (Left frontal scalp, cryotherapy at last visit). Has scaly spots on forehead that get irritated  The following portions of the chart were reviewed this encounter and updated as appropriate:     Review of Systems:  No other skin or systemic complaints except as noted in HPI or Assessment and Plan.  Objective  Well appearing patient in no apparent distress; mood and affect are within normal limits.  A focused examination was performed including face. Relevant physical exam findings are noted in the Assessment and Plan.  Objective  Inferior vertex scalp: Erythematous thin papules/macules with gritty scale.   Objective  Left Frontal Scalp: No evidence of recurrence, call clinic for new or changing lesions.   Objective  Right Forehead x6 (6): Erythematous keratotic or waxy stuck-on papule.    Assessment & Plan  AK (actinic keratosis) Inferior vertex scalp  Cryotherapy today   Destruction of lesion - Inferior vertex scalp  Destruction method: cryotherapy   Informed consent: discussed and consent obtained   Lesion destroyed using liquid nitrogen: Yes   Region frozen until ice ball extended beyond lesion: Yes   Outcome: patient tolerated procedure well with no complications   Post-procedure details: wound care instructions given   Additional details:  Prior to procedure, discussed risks of blister formation, small wound, skin dyspigmentation, or rare scar following cryotherapy.  History of actinic keratoses Left Frontal Scalp  Clear. Observe for recurrence. Call clinic for new or changing lesions.  Recommend regular skin exams, daily broad-spectrum spf 30+ sunscreen use, and photoprotection.     Inflamed seborrheic keratosis (6) Right Forehead x6  Cryotherapy today Prior to procedure, discussed risks of blister formation, small wound, skin dyspigmentation,  or rare scar following cryotherapy.    Destruction of lesion - Right Forehead x6  Destruction method: cryotherapy   Informed consent: discussed and consent obtained   Lesion destroyed using liquid nitrogen: Yes   Region frozen until ice ball extended beyond lesion: Yes   Outcome: patient tolerated procedure well with no complications   Post-procedure details: wound care instructions given     Actinic Damage - diffuse scaly erythematous macules with underlying dyspigmentation - Recommend daily broad spectrum sunscreen SPF 30+ to sun-exposed areas, reapply every 2 hours as needed.  - Call for new or changing lesions.  Sebaceous Hyperplasia - Small yellow papules with a central dell - Benign - Observe   Return for TBSE in December with Dr. Laurence Ferrari.   Garrett Green, CMA, am acting as scribe for Garrett Patty, MD .  Documentation: I have reviewed the above documentation for accuracy and completeness, and I agree with the above.  Garrett Patty MD

## 2019-08-15 NOTE — Patient Instructions (Addendum)
Recommend daily broad spectrum sunscreen SPF 30+ to sun-exposed areas, reapply every 2 hours as needed. Call for new or changing lesions.  Cryotherapy Aftercare  . Wash gently with soap and water everyday.   . Apply Vaseline and Band-Aid daily until healed.  

## 2019-08-17 ENCOUNTER — Other Ambulatory Visit: Payer: Self-pay | Admitting: Family Medicine

## 2019-08-17 NOTE — Telephone Encounter (Signed)
Requested Prescriptions  Pending Prescriptions Disp Refills  . ramipril (ALTACE) 10 MG capsule [Pharmacy Med Name: RAMIPRIL  10MG   CAP] 90 capsule 1    Sig: TAKE 1 CAPSULE BY MOUTH  DAILY     Cardiovascular:  ACE Inhibitors Passed - 08/17/2019  9:43 PM      Passed - Cr in normal range and within 180 days    Creatinine, Ser  Date Value Ref Range Status  07/09/2019 1.11 0.76 - 1.27 mg/dL Final         Passed - K in normal range and within 180 days    Potassium  Date Value Ref Range Status  07/09/2019 4.9 3.5 - 5.2 mmol/L Final         Passed - Patient is not pregnant      Passed - Last BP in normal range    BP Readings from Last 1 Encounters:  07/09/19 138/70         Passed - Valid encounter within last 6 months    Recent Outpatient Visits          1 month ago Essential (primary) hypertension   [facility] Birdie Sons, MD   8 months ago Encounter for [employer] annual wellness exam   [facility] Birdie Sons, MD   1 year ago Encounter for [employer] annual wellness exam   [facility] Birdie Sons, MD   1 year ago Right thalamic infarction [facility])   [facility] Birdie Sons, MD   1 year ago Chest pressure   [place], Vickki Muff, [place]

## 2019-09-10 ENCOUNTER — Other Ambulatory Visit: Payer: Self-pay

## 2019-09-10 ENCOUNTER — Ambulatory Visit (HOSPITAL_COMMUNITY)
Admission: RE | Admit: 2019-09-10 | Discharge: 2019-09-10 | Disposition: A | Discharge status: Home / Self Care | Payer: Medicare Other | Source: Ambulatory Visit | Attending: Interventional Radiology | Admitting: Interventional Radiology

## 2019-09-10 DIAGNOSIS — I639 Cerebral infarction, unspecified: Secondary | ICD-10-CM | POA: Insufficient documentation

## 2019-09-11 ENCOUNTER — Other Ambulatory Visit (HOSPITAL_COMMUNITY): Payer: Self-pay | Admitting: Interventional Radiology

## 2019-09-11 DIAGNOSIS — R531 Weakness: Secondary | ICD-10-CM

## 2019-09-11 DIAGNOSIS — I771 Stricture of artery: Secondary | ICD-10-CM

## 2019-09-18 ENCOUNTER — Ambulatory Visit (HOSPITAL_COMMUNITY): Admission: RE | Admit: 2019-09-18 | Payer: Medicare Other | Source: Ambulatory Visit

## 2019-09-19 ENCOUNTER — Ambulatory Visit (HOSPITAL_COMMUNITY)
Admission: RE | Admit: 2019-09-19 | Discharge: 2019-09-19 | Disposition: A | Discharge status: Home / Self Care | Payer: Medicare Other | Source: Ambulatory Visit | Attending: Interventional Radiology | Admitting: Interventional Radiology

## 2019-09-19 ENCOUNTER — Other Ambulatory Visit: Payer: Self-pay

## 2019-09-19 DIAGNOSIS — I771 Stricture of artery: Secondary | ICD-10-CM

## 2019-09-19 DIAGNOSIS — R531 Weakness: Secondary | ICD-10-CM

## 2019-09-19 NOTE — Progress Notes (Signed)
Chief Complaint: Patient was seen in consultation today for follow up MRI results  Supervising Physician: Luanne Bras  Patient Status: [facility] - Out-pt  History of Present Illness: Garrett Green is a 74 y.o. male known to [place] for follow up of intracranial stenoses. He's had multiple angiograms in the past following him since he's had hx of stroke with residual left sided paresthesias. He remains on ASA and Plavix in addition to all his other medications. Wife is with him today. PMHx, meds, labs, imaging, allergies reviewed. Feels well, no recent fevers, chills, illness.  He states he doesn't think he's had any new neurologic changes or symptoms.  He had recent MRI and is here to discuss results.   Past Medical History:  Diagnosis Date  . Arthritis    "lower back" (10/19/2017)  . GERD (gastroesophageal reflux disease)   . High cholesterol 10/19/2017  . History of blood transfusion 2014   "related to back OR"  . History of chicken pox   . Hypertension    stress test- 8-9 yrs. ago, [facility], [facility]  . Stroke ([place])   . TIA (transient ischemic attack) 10/17/2017; 10/19/2017    Past Surgical History:  Procedure Laterality Date  . BACK SURGERY    . CATARACT EXTRACTION W/ INTRAOCULAR LENS IMPLANT Left   . COLONOSCOPY WITH PROPOFOL N/A 03/16/2016   Procedure: COLONOSCOPY WITH PROPOFOL;  Surgeon: Manya Silvas, MD;  Location: [facility] ENDOSCOPY;  Service: Endoscopy;  Laterality: N/A;  . ESOPHAGOGASTRODUODENOSCOPY (EGD) WITH ESOPHAGEAL DILATION  1990s  . IR ANGIO INTRA EXTRACRAN SEL COM CAROTID INNOMINATE BILAT MOD SED  10/31/2017  . IR ANGIO INTRA EXTRACRAN SEL COM CAROTID INNOMINATE BILAT MOD SED  01/15/2019  . IR ANGIO VERTEBRAL SEL VERTEBRAL BILAT MOD SED  10/31/2017  . IR ANGIO VERTEBRAL SEL VERTEBRAL BILAT MOD SED  01/15/2019  . IR US GUIDE VASC ACCESS RIGHT  01/15/2019  . LUMBAR LAMINECTOMY/DECOMPRESSION MICRODISCECTOMY Left 07/26/2012   Procedure: LUMBAR LAMINECTOMY/DECOMPRESSION  MICRODISCECTOMY 1 LEVEL;  Surgeon: Eustace Moore, MD;  Location: [facility] NEURO ORS;  Service: Neurosurgery;  Laterality: Left;  lumbar five sacral one  . SHOULDER ARTHROSCOPY WITH OPEN ROTATOR CUFF REPAIR Left 1990s  . TONSILLECTOMY      Allergies: Sulfa antibiotics  Medications: Prior to Admission medications   Medication Sig Start Date End Date Taking? Authorizing Provider  aspirin 325 MG tablet Take 325 mg by mouth daily.    [provider]  atorvastatin (LIPITOR) 40 MG tablet Take 1 tablet (40 mg total) by mouth daily at 6 PM. 07/12/19   Fisher, Kirstie Peri, MD  brimonidine (ALPHAGAN P) 0.1 % SOLN Place 1 drop into the left eye 2 (two) times daily.    [provider]  clopidogrel (PLAVIX) 75 MG tablet Take 1 tablet (75 mg total) by mouth daily. 07/12/19   Birdie Sons, MD  fluticasone (FLONASE) 50 MCG/ACT nasal spray Place 2 sprays into both nostrils daily as needed for allergies. 11/30/16   Birdie Sons, MD  HYDROcodone-acetaminophen (NORCO/VICODIN) 5-325 MG tablet Take 1 tablet by mouth every 6 (six) hours as needed. 06/26/19   Birdie Sons, MD  latanoprost (XALATAN) 0.005 % ophthalmic solution Place 1 drop into both eyes at bedtime.    [provider]  loratadine (CLARITIN) 10 MG tablet Take 10 mg by mouth daily as needed for allergies.    [provider]  LORazepam (ATIVAN) 1 MG tablet Take 1 tablet (1 mg total) by mouth 3 (three) times  daily as needed. Patient taking differently: Take 1 mg by mouth as directed. Only for flying 11/30/16   Birdie Sons, MD  ramipril (ALTACE) 10 MG capsule TAKE 1 CAPSULE BY MOUTH  DAILY 08/17/19   Birdie Sons, MD  zolpidem (AMBIEN) 10 MG tablet Take 0.5-1 tablets (5-10 mg total) by mouth at bedtime as needed for sleep. 12/11/18   Birdie Sons, MD     Family History  Problem Relation Age of Onset  . Heart disease Mother   . Cancer Sister   . Dementia Brother   . Dementia Sister   . Diabetes Brother   .  Diabetes Sister     Social History   Socioeconomic History  . Marital status: Married    Spouse name: Not on file  . Number of children: Not on file  . Years of education: Not on file  . Highest education level: Not on file  Occupational History    Employer: RETIRED  Tobacco Use  . Smoking status: Former Smoker    Packs/day: 1.00    Years: 25.00    Pack years: 25.00    Quit date: 03/03/1988    Years since quitting: 31.5  . Smokeless tobacco: Never Used  Vaping Use  . Vaping Use: Never used  Substance and Sexual Activity  . Alcohol use: Yes    Alcohol/week: 1.0 standard drink    Types: 1 Standard drinks or equivalent per week  . Drug use: Never  . Sexual activity: Yes  Other Topics Concern  . Not on file  Social History Narrative  . Not on file   Social Determinants of Health   Financial Resource Strain:   . Difficulty of Paying Living Expenses:   Food Insecurity:   . Worried About Charity fundraiser in the Last Year:   . Arboriculturist in the Last Year:   Transportation Needs:   . Film/video editor (Medical):   Marland Kitchen Lack of Transportation (Non-Medical):   Physical Activity:   . Days of Exercise per Week:   . Minutes of Exercise per Session:   Stress:   . Feeling of Stress :   Social Connections:   . Frequency of Communication with Friends and Family:   . Frequency of Social Gatherings with Friends and Family:   . Attends Religious Services:   . Active Member of Clubs or Organizations:   . Attends Archivist Meetings:   Marland Kitchen Marital Status:      Review of Systems: A 12 point ROS discussed and pertinent positives are indicated in the HPI above.  All other systems are negative.  Review of Systems  Vital Signs: There were no vitals taken for this visit.  Physical Exam Constitutional:      General: He is not in acute distress.    Appearance: Normal appearance. He is not ill-appearing.  HENT:     Mouth/Throat:     Mouth: Mucous membranes  are moist.     Pharynx: Oropharynx is clear.  Cardiovascular:     Rate and Rhythm: Normal rate and regular rhythm.     Pulses: Normal pulses.     Heart sounds: Normal heart sounds.  Pulmonary:     Effort: Pulmonary effort is normal. No respiratory distress.     Breath sounds: Normal breath sounds.  Skin:    General: Skin is warm and dry.  Neurological:     Mental Status: He is alert and oriented to person, place, and  time.  Psychiatric:        Mood and Affect: Mood normal.        Thought Content: Thought content normal.        Judgment: Judgment normal.     Imaging: MR ANGIO HEAD WO CONTRAST  Result Date: 09/11/2019 CLINICAL DATA:  CVA. EXAM: MRA HEAD WITHOUT CONTRAST TECHNIQUE: Angiographic images of the Circle of Willis were obtained using MRA technique without intravenous contrast. COMPARISON:  MRA 11/23/2018 FINDINGS: Both vertebral arteries patent to the basilar. PICA patent bilaterally. Basilar widely patent. Superior cerebellar arteries patent bilaterally. Moderate stenosis right P2 and moderate to severe stenosis right P3. Moderate stenosis left P2. Internal carotid artery patent bilaterally without stenosis. Anterior cerebral arteries patent bilaterally without stenosis. Severe stenosis distal right M1. Right middle cerebral artery branches patent. Mild stenosis left M1.  Left MCA branches patent. Negative for cerebral aneurysm. IMPRESSION: Intracranial atherosclerotic disease stable from the prior study. Electronically Signed   By: Franchot Gallo M.D.   On: 09/11/2019 09:38    Labs:  CBC: Recent Labs    12/11/18 1059 01/15/19 0806  WBC 7.8 7.8  HGB 14.9 13.3  HCT 44.0 39.3  PLT 205 173    COAGS: Recent Labs    01/15/19 0806  INR 1.0    BMP: Recent Labs    11/23/18 1225 12/11/18 1059 01/15/19 0806 07/09/19 0907  NA  --  140 137 138  K  --  5.1 4.3 4.9  CL  --  104 106 104  CO2  --  22 22 19*  GLUCOSE  --  111* 119* 105*  BUN  --  15 15 23   CALCIUM   --  9.7 9.0 9.4  CREATININE 1.41* 1.26 1.12 1.11  GFRNONAA 50* 57* >60 66  GFRAA 58* 66 >60 77    LIVER FUNCTION TESTS: Recent Labs    12/11/18 1059 07/09/19 0907  BILITOT 0.9 0.7  AST 18 21  ALT 18 19  ALKPHOS 74 90  PROT 7.0 6.5  ALBUMIN 4.7 4.2    TUMOR MARKERS: No results for input(s): AFPTM, CEA, CA199, CHROMGRNA in the last 8760 hours.  Assessment and Plan: Hx of prior CVA with residual left sided deficits. Review of MRI suggests progression of distal (R)M1 stenosis. Although he is not having any new sxs, would recommend further eval with formal angiogram. Pt and wife agree and would like to schedule. Recommend he cont his ASA and Plavix as he is. Drink plenty of water/stay hydrated particularly in these hot summer months. Will schedule angiogram at earliest availability. Reviewed procedure in detail with pt and wife.   Thank you for this interesting consult.  I greatly enjoyed meeting Garrett Green and look forward to participating in their care.  A copy of this report was sent to the requesting provider on this date.  Electronically Signed: Ascencion Dike, PA-C 09/19/2019, 1:52 PM   I spent a total of 20 minutes in face to face in clinical consultation, greater than 50% of which was counseling/coordinating care for intracranial arterial stenosis

## 2019-09-24 ENCOUNTER — Other Ambulatory Visit (HOSPITAL_COMMUNITY): Payer: Self-pay | Admitting: Interventional Radiology

## 2019-09-24 DIAGNOSIS — I771 Stricture of artery: Secondary | ICD-10-CM

## 2019-09-25 ENCOUNTER — Other Ambulatory Visit: Payer: Self-pay | Admitting: Physician Assistant

## 2019-09-26 ENCOUNTER — Ambulatory Visit (HOSPITAL_COMMUNITY): Admission: RE | Admit: 2019-09-26 | Payer: Medicare Other | Source: Ambulatory Visit

## 2019-09-26 ENCOUNTER — Other Ambulatory Visit: Payer: Self-pay | Admitting: Radiology

## 2019-09-27 ENCOUNTER — Ambulatory Visit (HOSPITAL_COMMUNITY)
Admission: RE | Admit: 2019-09-27 | Discharge: 2019-09-27 | Disposition: A | Discharge status: Home / Self Care | Payer: Medicare Other | Source: Ambulatory Visit | Attending: Interventional Radiology | Admitting: Interventional Radiology

## 2019-09-27 ENCOUNTER — Other Ambulatory Visit: Payer: Self-pay

## 2019-09-27 ENCOUNTER — Other Ambulatory Visit (HOSPITAL_COMMUNITY): Payer: Self-pay | Admitting: Interventional Radiology

## 2019-09-27 ENCOUNTER — Encounter (HOSPITAL_COMMUNITY): Payer: Self-pay

## 2019-09-27 DIAGNOSIS — Z87891 Personal history of nicotine dependence: Secondary | ICD-10-CM | POA: Insufficient documentation

## 2019-09-27 DIAGNOSIS — I771 Stricture of artery: Secondary | ICD-10-CM

## 2019-09-27 DIAGNOSIS — Z7902 Long term (current) use of antithrombotics/antiplatelets: Secondary | ICD-10-CM | POA: Insufficient documentation

## 2019-09-27 DIAGNOSIS — I6603 Occlusion and stenosis of bilateral middle cerebral arteries: Secondary | ICD-10-CM | POA: Diagnosis present

## 2019-09-27 DIAGNOSIS — Z8673 Personal history of transient ischemic attack (TIA), and cerebral infarction without residual deficits: Secondary | ICD-10-CM | POA: Diagnosis not present

## 2019-09-27 DIAGNOSIS — Z7982 Long term (current) use of aspirin: Secondary | ICD-10-CM | POA: Diagnosis not present

## 2019-09-27 DIAGNOSIS — Z79899 Other long term (current) drug therapy: Secondary | ICD-10-CM | POA: Insufficient documentation

## 2019-09-27 DIAGNOSIS — K219 Gastro-esophageal reflux disease without esophagitis: Secondary | ICD-10-CM | POA: Insufficient documentation

## 2019-09-27 DIAGNOSIS — E7800 Pure hypercholesterolemia, unspecified: Secondary | ICD-10-CM | POA: Insufficient documentation

## 2019-09-27 DIAGNOSIS — I10 Essential (primary) hypertension: Secondary | ICD-10-CM | POA: Insufficient documentation

## 2019-09-27 HISTORY — PX: IR ANGIO INTRA EXTRACRAN SEL COM CAROTID INNOMINATE BILAT MOD SED: IMG5360

## 2019-09-27 HISTORY — PX: IR ANGIO VERTEBRAL SEL VERTEBRAL BILAT MOD SED: IMG5369

## 2019-09-27 HISTORY — PX: IR US GUIDE VASC ACCESS RIGHT: IMG2390

## 2019-09-27 LAB — BASIC METABOLIC PANEL
Anion gap: 10 (ref 5–15)
BUN: 12 mg/dL (ref 8–23)
CO2: 22 mmol/L (ref 22–32)
Calcium: 9.1 mg/dL (ref 8.9–10.3)
Chloride: 107 mmol/L (ref 98–111)
Creatinine, Ser: 1.05 mg/dL (ref 0.61–1.24)
GFR calc Af Amer: >60 mL/min (ref >60)
GFR calc non Af Amer: >60 mL/min (ref >60)
Glucose, Bld: 128 mg/dL — ABNORMAL HIGH (ref 70–99)
Potassium: 5 mmol/L (ref 3.5–5.1)
Sodium: 139 mmol/L (ref 135–145)

## 2019-09-27 LAB — CBC
HCT: 40.1 % (ref 39.0–52.0)
Hemoglobin: 13.9 g/dL (ref 13.0–17.0)
MCH: 31.2 pg (ref 26.0–34.0)
MCHC: 34.7 g/dL (ref 30.0–36.0)
MCV: 90.1 fL (ref 80.0–100.0)
Platelets: 183 10*3/uL (ref 150–400)
RBC: 4.45 MIL/uL (ref 4.22–5.81)
RDW: 12.2 % (ref 11.5–15.5)
WBC: 7 10*3/uL (ref 4.0–10.5)
nRBC: 0 % (ref 0.0–0.2)

## 2019-09-27 LAB — PROTIME-INR
INR: 1 (ref 0.8–1.2)
Prothrombin Time: 13 seconds (ref 11.4–15.2)

## 2019-09-27 MED ORDER — MIDAZOLAM HCL 2 MG/2ML IJ SOLN
INTRAMUSCULAR | Status: AC
  Filled 2019-09-27: qty 2

## 2019-09-27 MED ORDER — LORAZEPAM 2 MG/ML IJ SOLN: INTRAMUSCULAR | Status: AC | PRN

## 2019-09-27 MED ORDER — HEPARIN SODIUM (PORCINE) 1000 UNIT/ML IJ SOLN
INTRAMUSCULAR | Status: AC
  Filled 2019-09-27: qty 1

## 2019-09-27 MED ORDER — NITROGLYCERIN 1 MG/10 ML FOR IR/CATH LAB
INTRA_ARTERIAL | Status: AC
  Administered 2019-09-27: 400 ug
  Filled 2019-09-27: qty 10

## 2019-09-27 MED ORDER — IOHEXOL 300 MG/ML  SOLN
50.0000 mL | Freq: Once | INTRAMUSCULAR | Status: AC | PRN
  Administered 2019-09-27: 17 mL via INTRA_ARTERIAL

## 2019-09-27 MED ORDER — FENTANYL CITRATE (PF) 100 MCG/2ML IJ SOLN
INTRAMUSCULAR | Status: AC | PRN
  Administered 2019-09-27: 25 ug via INTRAVENOUS

## 2019-09-27 MED ORDER — IOHEXOL 300 MG/ML  SOLN
150.0000 mL | Freq: Once | INTRAMUSCULAR | Status: AC | PRN
  Administered 2019-09-27: 75 mL via INTRA_ARTERIAL

## 2019-09-27 MED ORDER — SODIUM CHLORIDE 0.9 % IV SOLN: INTRAVENOUS | Status: AC

## 2019-09-27 MED ORDER — VERAPAMIL HCL 2.5 MG/ML IV SOLN
INTRAVENOUS | Status: AC
  Administered 2019-09-27: 2.5 mg
  Filled 2019-09-27: qty 2

## 2019-09-27 MED ORDER — LIDOCAINE HCL 1 % IJ SOLN
INTRAMUSCULAR | Status: AC
  Filled 2019-09-27: qty 20

## 2019-09-27 MED ORDER — MIDAZOLAM HCL 2 MG/2ML IJ SOLN
INTRAMUSCULAR | Status: AC | PRN
  Administered 2019-09-27: 1 mg via INTRAVENOUS

## 2019-09-27 MED ORDER — FENTANYL CITRATE (PF) 100 MCG/2ML IJ SOLN
INTRAMUSCULAR | Status: AC
  Filled 2019-09-27: qty 2

## 2019-09-27 MED ORDER — HEPARIN SODIUM (PORCINE) 1000 UNIT/ML IJ SOLN
INTRAMUSCULAR | Status: AC
  Administered 2019-09-27: 2000 [IU]
  Filled 2019-09-27: qty 1

## 2019-09-27 MED ORDER — SODIUM CHLORIDE 0.9 % IV SOLN: INTRAVENOUS | Status: DC

## 2019-09-27 NOTE — Progress Notes (Signed)
Report received from Jodette,RN 

## 2019-09-27 NOTE — Discharge Instructions (Addendum)
Radial Site Care  This sheet gives you information about how to care for yourself after your procedure. Your health care provider may also give you more specific instructions. If you have problems or questions, contact your health care provider. What can I expect after the procedure? After the procedure, it is common to have:  Bruising and tenderness at the catheter insertion area. Follow these instructions at home: Medicines  Take over-the-counter and prescription medicines only as told by your health care provider. Insertion site care  Follow instructions from your health care provider about how to take care of your insertion site. Make sure you: ? Wash your hands with soap and water before you change your bandage (dressing). If soap and water are not available, use hand sanitizer. ? Change your dressing as told by your health care provider. ? Leave stitches (sutures), skin glue, or adhesive strips in place. These skin closures may need to stay in place for 2 weeks or longer. If adhesive strip edges start to loosen and curl up, you may trim the loose edges. Do not remove adhesive strips completely unless your health care provider tells you to do that.  Check your insertion site every day for signs of infection. Check for: ? Redness, swelling, or pain. ? Fluid or blood. ? Pus or a bad smell. ? Warmth.  Do not take baths, swim, or use a hot tub until your health care provider approves.  You may shower 24-48 hours after the procedure, or as directed by your health care provider. ? Remove the dressing and gently wash the site with plain soap and water. ? Pat the area dry with a clean towel. ? Do not rub the site. That could cause bleeding.  Do not apply powder or lotion to the site. Activity   For 24 hours after the procedure, or as directed by your health care provider: ? Do not flex or bend the affected arm. ? Do not push or pull heavy objects with the affected arm. ? Do not  drive yourself home from the hospital or clinic. You may drive 24 hours after the procedure unless your health care provider tells you not to. ? Do not operate machinery or power tools.  Do not lift anything that is heavier than 10 lb (4.5 kg), or the limit that you are told, until your health care provider says that it is safe.  Ask your health care provider when it is okay to: ? Return to work or school. ? Resume usual physical activities or sports. ? Resume sexual activity. General instructions  If the catheter site starts to bleed, raise your arm and put firm pressure on the site. If the bleeding does not stop, get help right away. This is a medical emergency.  If you went home on the same day as your procedure, a responsible adult should be with you for the first 24 hours after you arrive home.  Keep all follow-up visits as told by your health care provider. This is important. Contact a health care provider if:  You have a fever.  You have redness, swelling, or yellow drainage around your insertion site. Get help right away if:  You have unusual pain at the radial site.  The catheter insertion area swells very fast.  The insertion area is bleeding, and the bleeding does not stop when you hold steady pressure on the area.  Your arm or hand becomes pale, cool, tingly, or numb. These symptoms may represent a serious problem   that is an emergency. Do not wait to see if the symptoms will go away. Get medical help right away. Call your local emergency services (911 in the [place] Do not drive yourself to the hospital. Summary  After the procedure, it is common to have bruising and tenderness at the site.  Follow instructions from your health care provider about how to take care of your radial site wound. Check the wound every day for signs of infection.  Do not lift anything that is heavier than 10 lb (4.5 kg), or the limit that you are told, until your health care provider says  that it is safe. This information is not intended to replace advice given to you by your health care provider. Make sure you discuss any questions you have with your health care provider. Document Revised: 05/03/2017 Document Reviewed: 05/03/2017 Elsevier Patient Education  2020 [employer] Cerebral Angiogram, Care After This sheet gives you information about how to care for yourself after your procedure. Your health care provider may also give you more specific instructions. If you have problems or questions, contact your health care provider. What can I expect after the procedure? After the procedure, it is common to have:  Bruising and tenderness at the catheter insertion site.  A mild headache. Follow these instructions at home: Insertion site care  Follow instructions from your health care provider about how to take care of the insertion site. Make sure you: ? Wash your hands with soap and water before and after you change your bandage (dressing). If soap and water are not available, use hand sanitizer. ? Change your dressing as told by your health care provider.  Do not take baths, swim, or use a hot tub until your health care provider approves. You may shower 24-48 hours after the procedure, or as told by your health care provider.  To clean your insertion site: ? Gently wash the site with plain soap and water. ? Pat the area dry with a clean towel. ? Do not rub the site. This may cause bleeding.  Do not apply powder or lotion to the site. Keep the site clean and dry. Infection signs Check your incision area every day for signs of infection. Check for:  Redness, swelling, or pain.  Fluid or blood.  Warmth.  Pus or a bad smell.  Activity  Do not drive for 24 hours if you were given a sedative during your procedure.  Rest as told by your health care provider.  Do not lift anything that is heavier than 10 lb (4.5 kg), or the limit that you are told, until your  health care provider says that it is safe.  Return to your normal activities as told by your health care provider, usually in about a week. Ask your health care provider what activities are safe for you. General instructions   If your insertion site starts to bleed, lie flat and put pressure on the site. If the bleeding does not stop, get help right away. This is a medical emergency.  Do not use any products that contain nicotine or tobacco, such as cigarettes, e-cigarettes, and chewing tobacco. If you need help quitting, ask your health care provider.  Take over-the-counter and prescription medicines only as told by your health care provider.  Drink enough fluid to keep your urine pale yellow. This helps flush the contrast dye from your body.  Keep all follow-up visits as directed by your health care provider. This is important. Contact a health   care provider if:  You have a fever or chills.  You have redness, swelling, or pain around your insertion site.  You have fluid or blood coming from your insertion site.  The insertion site feels warm to the touch.  You have pus or a bad smell coming from your insertion site.  You notice blood collecting in the tissue around the insertion site (hematoma). The hematoma may be painful to the touch. Get help right away if:  You have chest pain or trouble breathing.  You have severe pain or swelling at the insertion site.  The insertion area bleeds, and bleeding continues after 30 minutes of holding steady pressure on the site.  The arm or leg where the catheter was inserted is pale, cold, numb, tingling, or weak.  You have a rash.  You have any symptoms of a stroke. "BE FAST" is an easy way to remember the main warning signs of a stroke: ? B - Balance. Signs are dizziness, sudden trouble walking, or loss of balance. ? E - Eyes. Signs are trouble seeing or a sudden change in vision. ? F - Face. Signs are sudden weakness or numbness of  the face, or the face or eyelid drooping on one side. ? A - Arms. Signs are weakness or numbness in an arm. This happens suddenly and usually on one side of the body. ? S - Speech. Signs are sudden trouble speaking, slurred speech, or trouble understanding what people say. ? T - Time. Time to call emergency services. Write down what time symptoms started.  You have other signs of a stroke, such as: ? A sudden, severe headache with no known cause. ? Nausea or vomiting. ? Seizure. These symptoms may represent a serious problem that is an emergency. Do not wait to see if the symptoms will go away. Get medical help right away. Call your local emergency services (911 in the [place] Do not drive yourself to the hospital. Summary  Bruising and tenderness at the insertion site are common.  Follow your health care provider's instructions about caring for your insertion site. Change dressing and clean the area as instructed.  If your insertion site bleeds, apply direct pressure until bleeding stops.  Return to your normal activities as told by your health care provider. Ask what activities are safe.  Rest and drink plenty of fluids. This information is not intended to replace advice given to you by your health care provider. Make sure you discuss any questions you have with your health care provider. Document Revised: 10/16/2018 Document Reviewed: 10/16/2018 Elsevier Patient Education  2020 [employer] Moderate Conscious Sedation, Adult Sedation is the use of medicines to promote relaxation and relieve discomfort and anxiety. Moderate conscious sedation is a type of sedation. Under moderate conscious sedation, you are less alert than normal, but you are still able to respond to instructions, touch, or both. Moderate conscious sedation is used during short medical and dental procedures. It is milder than deep sedation, which is a type of sedation under which you cannot be easily woken up. It is also  milder than general anesthesia, which is the use of medicines to make you unconscious. Moderate conscious sedation allows you to return to your regular activities sooner. Tell a health care provider about:  Any allergies you have.  All medicines you are taking, including vitamins, herbs, eye drops, creams, and over-the-counter medicines.  Use of steroids (by mouth or creams).  Any problems you or family members have had with   sedatives and anesthetic medicines.  Any blood disorders you have.  Any surgeries you have had.  Any medical conditions you have, such as sleep apnea.  Whether you are pregnant or may be pregnant.  Any use of cigarettes, alcohol, marijuana, or street drugs. What are the risks? Generally, this is a safe procedure. However, problems may occur, including:  Getting too much medicine (oversedation).  Nausea.  Allergic reaction to medicines.  Trouble breathing. If this happens, a breathing tube may be used to help with breathing. It will be removed when you are awake and breathing on your own.  Heart trouble.  Lung trouble. What happens before the procedure? Staying hydrated Follow instructions from your health care provider about hydration, which may include:  Up to 2 hours before the procedure - you may continue to drink clear liquids, such as water, clear fruit juice, black coffee, and plain tea. Eating and drinking restrictions Follow instructions from your health care provider about eating and drinking, which may include:  8 hours before the procedure - stop eating heavy meals or foods such as meat, fried foods, or fatty foods.  6 hours before the procedure - stop eating light meals or foods, such as toast or cereal.  6 hours before the procedure - stop drinking milk or drinks that contain milk.  2 hours before the procedure - stop drinking clear liquids. Medicine Ask your health care provider about:  Changing or stopping your regular medicines.  This is especially important if you are taking diabetes medicines or blood thinners.  Taking medicines such as aspirin and ibuprofen. These medicines can thin your blood. Do not take these medicines before your procedure if your health care provider instructs you not to.  Tests and exams  You will have a physical exam.  You may have blood tests done to show: ? How well your kidneys and liver are working. ? How well your blood can clot. General instructions  Plan to have someone take you home from the hospital or clinic.  If you will be going home right after the procedure, plan to have someone with you for 24 hours. What happens during the procedure?  An IV tube will be inserted into one of your veins.  Medicine to help you relax (sedative) will be given through the IV tube.  The medical or dental procedure will be performed. What happens after the procedure?  Your blood pressure, heart rate, breathing rate, and blood oxygen level will be monitored often until the medicines you were given have worn off.  Do not drive for 24 hours. This information is not intended to replace advice given to you by your health care provider. Make sure you discuss any questions you have with your health care provider. Document Revised: 03/10/2017 Document Reviewed: 07/18/2015 Elsevier Patient Education  2020 [employer]  

## 2019-09-27 NOTE — Procedures (Signed)
S/P 4 vessel cerebral artreriogram RT rad approach. Findings. 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

## 2019-09-27 NOTE — H&P (Signed)
Chief Complaint: Patient was seen in consultation today for intra-cranial stenosis  Supervising Physician: Luanne Bras  Patient Status: [facility] - Out-pt  History of Present Illness: Garrett Green is a 74 y.o. 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  He has residual left-sided paresthesias and has undergone angiograms in the past for evaluation of his known stenosis. He presents today after recent surveillance MR showed progression of distal right M1 stenosis.    Patient presents today in his usual state of health.  He continues with paraesthesias of the left neck, arm, chest, and leg.  States this is stable.  He does complain of headaches at night this week, particularly after increased activity during the day. He denies current headache.  He has been NPO today.  He continues to take Plavix 28m once daily and aspirin 325 mg daily at home.    Past Medical History:  Diagnosis Date  . Arthritis    "lower back" (10/19/2017)  . GERD (gastroesophageal reflux disease)   . High cholesterol 10/19/2017  . History of blood transfusion 2014   "related to back OR"  . History of chicken pox   . Hypertension    stress test- 8-9 yrs. ago, [facility], [facility]  [place]   . TIA (transient ischemic attack) 10/17/2017; 10/19/2017    Past Surgical History:  Procedure Laterality Date  . BACK SURGERY    . CATARACT EXTRACTION W/ INTRAOCULAR LENS IMPLANT Left   . COLONOSCOPY WITH PROPOFOL N/A 03/16/2016   Procedure: COLONOSCOPY WITH PROPOFOL;  Surgeon: RManya Silvas MD;  [facility];  Service: Endoscopy;  Laterality: N/A;  . ESOPHAGOGASTRODUODENOSCOPY (EGD) WITH ESOPHAGEAL DILATION  1990s  . IR ANGIO INTRA EXTRACRAN SEL COM CAROTID INNOMINATE BILAT MOD SED  10/31/2017  . IR ANGIO INTRA EXTRACRAN SEL COM CAROTID INNOMINATE BILAT MOD SED  01/15/2019  . IR ANGIO VERTEBRAL SEL VERTEBRAL BILAT MOD SED  10/31/2017  . IR ANGIO VERTEBRAL SEL  VERTEBRAL BILAT MOD SED  01/15/2019  . IR [place] RIGHT  01/15/2019  . LUMBAR LAMINECTOMY/DECOMPRESSION MICRODISCECTOMY Left 07/26/2012   Procedure: LUMBAR LAMINECTOMY/DECOMPRESSION MICRODISCECTOMY 1 LEVEL;  Surgeon: DEustace Moore MD;  [facility];  Service: Neurosurgery;  Laterality: Left;  lumbar five sacral one  . SHOULDER ARTHROSCOPY WITH OPEN ROTATOR CUFF REPAIR Left 1990s  . TONSILLECTOMY      Allergies: Sulfa antibiotics  Medications: Prior to Admission medications   Medication Sig Start Date End Date Taking? Authorizing Provider  aspirin 325 MG tablet Take 325 mg by mouth daily.   Yes [provider]  atorvastatin (LIPITOR) 40 MG tablet Take 1 tablet (40 mg total) by mouth daily at 6 PM. 07/12/19  Yes Fisher, DKirstie Peri MD  brimonidine (ALPHAGAN P) 0.1 % SOLN Place 1 drop into the left eye 2 (two) times daily.   Yes [provider]  clopidogrel (PLAVIX) 75 MG tablet Take 1 tablet (75 mg total) by mouth daily. 07/12/19  Yes FBirdie Sons MD  fluticasone (FLONASE) 50 MCG/ACT nasal spray Place 2 sprays into both nostrils daily as needed for allergies. 11/30/16  Yes FBirdie Sons MD  HYDROcodone-acetaminophen (NORCO/VICODIN) 5-325 MG tablet Take 1 tablet by mouth every 6 (six) hours as needed. Patient taking differently: Take 1 tablet by mouth every 6 (six) hours as needed for severe pain.  06/26/19  Yes FBirdie Sons MD  latanoprost (XALATAN) 0.005 % ophthalmic solution Place  1 drop into both eyes at bedtime.   Yes [provider]  loratadine (CLARITIN) 10 MG tablet Take 10 mg by mouth daily as needed for allergies.   Yes [provider]  LORazepam (ATIVAN) 1 MG tablet Take 1 tablet (1 mg total) by mouth 3 (three) times daily as needed. Patient taking differently: Take 1 mg by mouth as directed. Only for flying 11/30/16  Yes Birdie Sons, MD  ramipril (ALTACE) 10 MG capsule TAKE 1 CAPSULE BY MOUTH  DAILY Patient taking  differently: Take 10 mg by mouth daily.  08/17/19  Yes Birdie Sons, MD  zolpidem (AMBIEN) 10 MG tablet Take 0.5-1 tablets (5-10 mg total) by mouth at bedtime as needed for sleep. 12/11/18  Yes Birdie Sons, MD     Family History  Problem Relation Age of Onset  . Heart disease Mother   . Cancer Sister   . Dementia Brother   . Dementia Sister   . Diabetes Brother   . Diabetes Sister     Social History   Socioeconomic History  . Marital status: Married    Spouse name: Not on file  . Number of children: Not on file  . Years of education: Not on file  . Highest education level: Not on file  Occupational History    Employer: RETIRED  Tobacco Use  . Smoking status: Former Smoker    Packs/day: 1.00    Years: 25.00    Pack years: 25.00    Quit date: 03/03/1988    Years since quitting: 31.5  . Smokeless tobacco: Never Used  Vaping Use  . Vaping Use: Never used  Substance and Sexual Activity  . Alcohol use: Yes    Alcohol/week: 1.0 standard drink    Types: 1 Standard drinks or equivalent per week  . Drug use: Never  . Sexual activity: Yes  Other Topics Concern  . Not on file  Social History Narrative  . Not on file   Social Determinants of Health   Financial Resource Strain:   . Difficulty of Paying Living Expenses:   Food Insecurity:   . Worried About Charity fundraiser in the Last Year:   . Arboriculturist in the Last Year:   Transportation Needs:   . Film/video editor (Medical):   Marland Kitchen Lack of Transportation (Non-Medical):   Physical Activity:   . Days of Exercise per Week:   . Minutes of Exercise per Session:   Stress:   . Feeling of Stress :   Social Connections:   . Frequency of Communication with Friends and Family:   . Frequency of Social Gatherings with Friends and Family:   . Attends Religious Services:   . Active Member of Clubs or Organizations:   . Attends Archivist Meetings:   Marland Kitchen Marital Status:      Review of Systems: A 12  point ROS discussed and pertinent positives are indicated in the HPI above.  All other systems are negative.  Review of Systems  Constitutional: Negative for fatigue and fever.  Respiratory: Negative for cough and shortness of breath.   Cardiovascular: Negative for chest pain.  Gastrointestinal: Negative for abdominal pain, diarrhea, nausea and vomiting.  Genitourinary: Negative for dysuria.  Musculoskeletal: Negative for back pain.  Neurological: Positive for headaches (ocassional at home). Negative for dizziness and facial asymmetry.  Psychiatric/Behavioral: Negative for behavioral problems and confusion.    Vital Signs: BP (!) 162/89   Pulse (!) 56  Temp 97.9 F (36.6 C) (Temporal)   Resp 18   Ht _0  (1.727 m)   Wt 185 lb (83.9 kg)   SpO2 99%   BMI 28.13 kg/m   Physical Exam Vitals and nursing note reviewed.  Constitutional:      General: He is not in acute distress.    Appearance: Normal appearance. He is not ill-appearing.  HENT:     Mouth/Throat:     Mouth: Mucous membranes are moist.     Pharynx: Oropharynx is clear.  Cardiovascular:     Rate and Rhythm: Normal rate and regular rhythm.  Pulmonary:     Effort: Pulmonary effort is normal. No respiratory distress.     Breath sounds: Normal breath sounds.  Abdominal:     General: Abdomen is flat.     Palpations: Abdomen is soft.  Skin:    General: Skin is warm and dry.  Neurological:     General: No focal deficit present.     Mental Status: He is alert and oriented to person, place, and time. Mental status is at baseline.  Psychiatric:        Mood and Affect: Mood normal.        Behavior: Behavior normal.        Thought Content: Thought content normal.        Judgment: Judgment normal.      MD Evaluation Airway: WNL Heart: WNL Abdomen: WNL Chest/ Lungs: WNL ASA  Classification: 3 Mallampati/Airway Score: One   Imaging: MR ANGIO HEAD WO CONTRAST  Result Date: 09/11/2019 CLINICAL DATA:  CVA.  EXAM: MRA HEAD WITHOUT CONTRAST TECHNIQUE: Angiographic images of the Circle of Willis were obtained using MRA technique without intravenous contrast. COMPARISON:  MRA 11/23/2018 FINDINGS: Both vertebral arteries patent to the basilar. PICA patent bilaterally. Basilar widely patent. Superior cerebellar arteries patent bilaterally. Moderate stenosis right P2 and moderate to severe stenosis right P3. Moderate stenosis left P2. Internal carotid artery patent bilaterally without stenosis. Anterior cerebral arteries patent bilaterally without stenosis. Severe stenosis distal right M1. Right middle cerebral artery branches patent. Mild stenosis left M1.  Left MCA branches patent. Negative for cerebral aneurysm. IMPRESSION: Intracranial atherosclerotic disease stable from the prior study. Electronically Signed   By: Franchot Gallo M.D.   On: 09/11/2019 09:38    Labs:  CBC: Recent Labs    12/11/18 1059 01/15/19 0806 09/27/19 1019  WBC 7.8 7.8 7.0  HGB 14.9 13.3 13.9  HCT 44.0 39.3 40.1  PLT 205 173 183    COAGS: Recent Labs    01/15/19 0806 09/27/19 1019  INR 1.0 1.0    BMP: Recent Labs    12/11/18 1059 01/15/19 0806 07/09/19 0907 09/27/19 1019  NA 140 137 138 139  K 5.1 4.3 4.9 5.0  CL 104 106 104 107  CO2 22 22 19* 22  GLUCOSE 111* 119* 105* 128*  BUN _1 CALCIUM 9.7 9.0 9.4 9.1  CREATININE 1.26 1.12 1.11 1.05  GFRNONAA 57* >60 66 >60  GFRAA 66 >60 77 >60    LIVER FUNCTION TESTS: Recent Labs    12/11/18 1059 07/09/19 0907  BILITOT 0.9 0.7  AST 18 21  ALT 18 19  ALKPHOS 74 90  PROT 7.0 6.5  ALBUMIN 4.7 4.2    TUMOR MARKERS: No results for input(s): AFPTM, CEA, CA199, CHROMGRNA in the last 8760 hours.  Assessment and Plan: Patient with past medical history of stroke, known intra-cranial stenosis presents with complaint of  worsening stenosis in the right M1 on recent MR scan.   Case reviewed by Dr. Estanislado Pandy who recently met with patient in consultation  and who approves patient for procedure.  Patient presents today in their usual state of health.  He has been NPO and remains on DAPT as expected.    Risks and benefits were discussed with the patient including, but not limited to bleeding, infection, vascular injury or contrast induced renal failure.  This interventional procedure involves the use of X-rays and because of the nature of the planned procedure, it is possible that we will have prolonged use of X-ray fluoroscopy.  Potential radiation risks to you include (but are not limited to) the following: - A slightly elevated risk for cancer  several years later in life. This risk is typically less than 0.5% percent. This risk is low in comparison to the normal incidence of human cancer, which is 33% for women and 50% for men according to the [place]. - Radiation induced injury can include skin redness, resembling a rash, tissue breakdown / ulcers and hair loss (which can be temporary or permanent).   The likelihood of either of these occurring depends on the difficulty of the procedure and whether you are sensitive to radiation due to previous procedures, disease, or genetic conditions.   IF your procedure requires a prolonged use of radiation, you will be notified and given written instructions for further action.  It is your responsibility to monitor the irradiated area for the 2 weeks following the procedure and to notify your physician if you are concerned that you have suffered a radiation induced injury.    All of the patient's questions were answered, patient is agreeable to proceed.  Consent signed and in chart.  Thank you for this interesting consult.  I greatly enjoyed meeting Garrett Green and look forward to participating in their care.  A copy of this report was sent to the requesting provider on this date.  Electronically Signed: Docia Barrier, PA 09/27/2019, 11:45 AM   I spent a total of     15 Minutes in face to face in clinical consultation, greater than 50% of which was counseling/coordinating care for intra-cranial stenosis.

## 2019-10-21 ENCOUNTER — Other Ambulatory Visit: Payer: Self-pay | Admitting: Family Medicine

## 2019-10-21 DIAGNOSIS — M47816 Spondylosis without myelopathy or radiculopathy, lumbar region: Secondary | ICD-10-CM

## 2019-10-21 MED ORDER — HYDROCODONE-ACETAMINOPHEN 5-325 MG PO TABS: 1.0000 | ORAL_TABLET | Freq: Four times a day (QID) | ORAL | 0 refills | Status: DC | PRN

## 2019-10-21 NOTE — Telephone Encounter (Signed)
HYDROcodone-acetaminophen (NORCO/VICODIN) 5-325 MG tablet  [id number] - [place], [place] - [address] Phone:  [phone]  Fax:  [phone]     FU with pt if can not refill

## 2019-10-21 NOTE — Telephone Encounter (Signed)
Requested medication (s) are due for refill today: yes  Requested medication (s) are on the active medication list: yes  Last refill: 06/26/2019   # 120  0 refills  Future visit scheduled yes  12/13/2019  Notes to clinic:Not delegated  Requested Prescriptions  Pending Prescriptions Disp Refills   HYDROcodone-acetaminophen (NORCO/VICODIN) 5-325 MG tablet 120 tablet 0    Sig: Take 1 tablet by mouth every 6 (six) hours as needed.      Not Delegated - Analgesics:  Opioid Agonist Combinations Failed - 10/21/2019 11:49 AM      Failed - This refill cannot be delegated      Failed - Urine Drug Screen completed in last 360 days.      Passed - Valid encounter within last 6 months    Recent Outpatient Visits           3 months ago Essential (primary) hypertension   [facility] Fisher, Kirstie Peri, MD   10 months ago Encounter for [employer] annual wellness exam   [facility] Birdie Sons, MD   1 year ago Encounter for [employer] annual wellness exam   [facility] Birdie Sons, MD   1 year ago Right thalamic infarction [facility])   [facility] Birdie Sons, MD   2 years ago Chest pressure   [place], [place]

## 2019-12-11 NOTE — Progress Notes (Signed)
[facility] Note  12/12/2019     CHIEF COMPLAINT Patient presents for Retina Evaluation   HISTORY OF PRESENT ILLNESS: Garrett Green is a 74 y.o. male who presents to the clinic today for:   HPI    Retina Evaluation    In both eyes.  This started 7 months ago.  Duration of 7 months.  Associated Symptoms Flashes, Floaters, Distortion, Pain, Photophobia and Glare.  Context:  distance vision, mid-range vision and near vision.  Treatments tried include eye drops and artificial tears.  Response to treatment was no improvement.  I, the attending physician,  performed the HPI with the patient and updated documentation appropriately.          Comments    74 y/o male pt.  Previous pt of Dr. Serita Grit.  Had cat sx OS about 4 yrs ago.  Had cat sx OD w/Dr. Sherlon Handing.  States retina was damaged following cat sx OS.  Pt has been noticing increased floaters OU since his last cat sx, and has been having intermittent FOL OS and pain OS x several mos as well.  VA decent OD [place], but very blurred OS [place].  Timolol QD OU (is supposed to use BID, but only uses it QD b/c it irritates his eyes and makes them very red).  Alphagan P BID OU.  Latanoprost QHS OU.       Last edited by Bernarda Caffey, MD on 12/12/2019 11:10 AM. (History)    pt is here for increased floaters OS, he is a previous pt of Dr. Armanda Heritage, pt states in 2016 he had cataract sx with Dr. Adriana Reams, he states during the sx Dr. Brigitte Pulse "chipped the lens" and tore his retina, he states Dr. Posey Pronto did retinal sx to repair the tear, and ever since then his vision has been foggy, pt states Dr. Talbert Forest did a YAG procedure in May, his vision at that time was 20/70, pt states Dr. Posey Pronto told him he had swelling in his left eye, he last saw Dr. Posey Pronto in May and was receiving steroid injections in his left eye to help decrease the swelling, pt is on Timolol QD, he states he is supposed to be using it BID, but his eye stays red and  irritated so he decreased it himself, he is also on Alphagan P BID OU and latanoprost QHS OU, pt states after the YAG in his left eye, he started seeing floaters in his right eye, pt states he is no longer seeing Dr. Posey Pronto bc he was told there was nothing else he could do for him  Referring physician: Thelma Comp, [place],  [place] [address]  HISTORICAL INFORMATION:   Selected notes from the MEDICAL RECORD NUMBER Referred by Dr. Truman Hayward:  Ocular Hx- PMH-    CURRENT MEDICATIONS: Current Outpatient Medications (Ophthalmic Drugs)  Medication Sig  . brimonidine (ALPHAGAN P) 0.1 % SOLN Place 1 drop into the left eye 2 (two) times daily.  Marland Kitchen latanoprost (XALATAN) 0.005 % ophthalmic solution Place 1 drop into both eyes at bedtime.  . timolol (TIMOPTIC) 0.5 % ophthalmic solution INSTILL ONE DROP TO THE AFFECTED EYE TWICE DAILY  . Bromfenac Sodium (PROLENSA) 0.07 % SOLN Place 1 drop into the left eye 4 (four) times daily.  . prednisoLONE acetate (PRED FORTE) 1 % ophthalmic suspension Place 1 drop into the left eye 4 (four) times daily.   No current facility-administered medications for this visit. (Ophthalmic  Drugs)   Current Outpatient Medications (Other)  Medication Sig  . aspirin 325 MG tablet Take 325 mg by mouth daily.  Marland Kitchen atorvastatin (LIPITOR) 40 MG tablet Take 1 tablet (40 mg total) by mouth daily at 6 PM.  . benzonatate (TESSALON) 200 MG capsule Take by mouth.  . clopidogrel (PLAVIX) 75 MG tablet Take 1 tablet (75 mg total) by mouth daily.  . fluticasone (FLONASE) 50 MCG/ACT nasal spray Place 2 sprays into both nostrils daily as needed for allergies.  Marland Kitchen HYDROcodone-acetaminophen (NORCO/VICODIN) 5-325 MG tablet Take 1 tablet by mouth every 6 (six) hours as needed.  . loratadine (CLARITIN) 10 MG tablet Take 10 mg by mouth daily as needed for allergies.  Marland Kitchen LORazepam (ATIVAN) 1 MG tablet Take 1 tablet (1 mg total) by mouth 3 (three) times daily as needed. (Patient taking  differently: Take 1 mg by mouth as directed. Only for flying)  . ramipril (ALTACE) 10 MG capsule TAKE 1 CAPSULE BY MOUTH  DAILY (Patient taking differently: Take 10 mg by mouth daily. )  . zolpidem (AMBIEN) 10 MG tablet Take 0.5-1 tablets (5-10 mg total) by mouth at bedtime as needed for sleep.   No current facility-administered medications for this visit. (Other)      REVIEW OF SYSTEMS: ROS    Positive for: Gastrointestinal, Neurological, Musculoskeletal, Cardiovascular, Eyes   Negative for: Constitutional, Skin, Genitourinary, HENT, Endocrine, Respiratory, Psychiatric, Allergic/Imm, Heme/Lymph   Last edited by Matthew Folks, COA on 12/12/2019  8:32 AM. (History)       ALLERGIES Allergies  Allergen Reactions  . Sulfa Antibiotics Other (See Comments)    Lethargic and hypotension    PAST MEDICAL HISTORY Past Medical History:  Diagnosis Date  . Arthritis    "lower back" (10/19/2017)  . GERD (gastroesophageal reflux disease)   . Glaucoma    OU  . High cholesterol 10/19/2017  . History of blood transfusion 2014   "related to back OR"  . History of chicken pox   . Hypertension    stress test- 8-9 yrs. ago, [facility], [facility]  . Stroke ([place])   . TIA (transient ischemic attack) 10/17/2017; 10/19/2017   Past Surgical History:  Procedure Laterality Date  . BACK SURGERY    . CATARACT EXTRACTION Right 05/2019   Dr. Talbert Forest  . CATARACT EXTRACTION Left 2017  . CATARACT EXTRACTION W/ INTRAOCULAR LENS IMPLANT Left   . COLONOSCOPY WITH PROPOFOL N/A 03/16/2016   Procedure: COLONOSCOPY WITH PROPOFOL;  Surgeon: Manya Silvas, MD;  Location: [facility] ENDOSCOPY;  Service: Endoscopy;  Laterality: N/A;  . ESOPHAGOGASTRODUODENOSCOPY (EGD) WITH ESOPHAGEAL DILATION  1990s  . EYE SURGERY Bilateral    Cat Sx  . IR ANGIO INTRA EXTRACRAN SEL COM CAROTID INNOMINATE BILAT MOD SED  10/31/2017  . IR ANGIO INTRA EXTRACRAN SEL COM CAROTID INNOMINATE BILAT MOD SED  01/15/2019  . IR ANGIO INTRA EXTRACRAN SEL COM  CAROTID INNOMINATE BILAT MOD SED  09/27/2019  . IR ANGIO VERTEBRAL SEL VERTEBRAL BILAT MOD SED  10/31/2017  . IR ANGIO VERTEBRAL SEL VERTEBRAL BILAT MOD SED  01/15/2019  . IR ANGIO VERTEBRAL SEL VERTEBRAL BILAT MOD SED  09/27/2019  . IR US GUIDE VASC ACCESS RIGHT  01/15/2019  . IR US GUIDE VASC ACCESS RIGHT  09/27/2019  . LUMBAR LAMINECTOMY/DECOMPRESSION MICRODISCECTOMY Left 07/26/2012   Procedure: LUMBAR LAMINECTOMY/DECOMPRESSION MICRODISCECTOMY 1 LEVEL;  Surgeon: Eustace Moore, MD;  Location: [facility];  Service: Neurosurgery;  Laterality: Left;  lumbar five sacral one  . SHOULDER ARTHROSCOPY  WITH OPEN ROTATOR CUFF REPAIR Left 1990s  . TONSILLECTOMY      FAMILY HISTORY Family History  Problem Relation Age of Onset  . Heart disease Mother   . Cancer Sister   . Dementia Brother   . Dementia Sister   . Diabetes Brother   . Diabetes Sister     SOCIAL HISTORY Social History   Tobacco Use  . Smoking status: Former Smoker    Packs/day: 1.00    Years: 25.00    Pack years: 25.00    Quit date: 03/03/1988    Years since quitting: 31.7  . Smokeless tobacco: Never Used  Vaping Use  . Vaping Use: Never used  Substance Use Topics  . Alcohol use: Yes    Alcohol/week: 1.0 standard drink    Types: 1 Standard drinks or equivalent per week  . Drug use: Never         OPHTHALMIC EXAM:  Base Eye Exam    Visual Acuity (Snellen - Linear)      Right Left   Dist [place] 20/30 20/150   Dist ph [place] 20/30 +2 NI       Tonometry (Tonopen, 8:34 AM)      Right Left   Pressure 15 11       Pupils      Dark Light Shape React APD   Right 3 2 Round Brisk None   Left 3 2 Round Brisk None       Visual Fields (Counting fingers)      Left Right    Full Full       Extraocular Movement      Right Left    Full, Ortho Full, Ortho       Neuro/Psych    Oriented x3: Yes   Mood/Affect: Normal       Dilation    Both eyes: 1.0% Mydriacyl, 2.5% Phenylephrine @ 8:34 AM        Slit Lamp and  Fundus Exam    Slit Lamp Exam      Right Left   Lids/Lashes Dermatochalasis - upper lid Dermatochalasis - upper lid   Conjunctiva/Sclera Mild nasal Pinguecula 1+ Injection, prominent limbal vessels nasally   Cornea Mild arcus, well healed temporal cataract wounds, 1+ Punctate epithelial erosions, trace Debris in tear film Mild arcus, well healed temporal cataract wounds, 2-3+ Punctate epithelial erosions, trace Debris in tear film   Anterior Chamber Deep and quiet Deep, 0.5+cell/pigment   Iris Round and dilated mild iridodonesis, Round and poorly dilated to 3.0   Lens PC IOL in good position PC IOL with fine pigment depositon, open PC   Vitreous Vitreous syneresis, Posterior vitreous detachment, vitreous condensations Vitreous syneresis       Fundus Exam      Right Left   Disc Pink and Sharp Pink and Sharp   C/D Ratio 0.5 0.6   Macula Flat, Blunted foveal reflex, mild RPE mottling, No heme or edema Blunted foveal reflex, central CME, no heme   Vessels mild attenuation, mild tortuousity, mild AV crossing changes mild attenuation, mild tortuousity, mild AV crossing changes   Periphery Attached    limited view due to poor dilates, attached, no heme        Refraction    Manifest Refraction      Sphere Cylinder Axis Dist VA   Right -0.50 Sphere  20/25   Left [place] +0.25 180 20/150          IMAGING AND PROCEDURES  Imaging and  Procedures for 12/12/2019  OCT, Retina - OU - Both Eyes       Right Eye Quality was good. Central Foveal Thickness: 311. Progression has no prior data. Findings include normal foveal contour, no IRF, no SRF.   Left Eye Quality was good. Central Foveal Thickness: 696. Progression has no prior data. Findings include abnormal foveal contour, intraretinal fluid, subretinal fluid, epiretinal membrane (Central CME).   Notes *Images captured and stored on drive  Diagnosis / Impression:  OD: NFP, no IRF/SRF OS: CME w/ +IRF/SRF; +ERM  Clinical management:   See below  Abbreviations: NFP - Normal foveal profile. CME - cystoid macular edema. PED - pigment epithelial detachment. IRF - intraretinal fluid. SRF - subretinal fluid. EZ - ellipsoid zone. ERM - epiretinal membrane. ORA - outer retinal atrophy. ORT - outer retinal tubulation. SRHM - subretinal hyper-reflective material. IRHM - intraretinal hyper-reflective material                 ASSESSMENT/PLAN:    ICD-10-CM   1. Cystoid macular edema of left eye  H35.352   2. Retinal edema  H35.81 OCT, Retina - OU - Both Eyes  3. History of retinal detachment  Z86.69   4. Posterior vitreous detachment of right eye  H43.811   5. Essential hypertension  I10   6. Hypertensive retinopathy of both eyes  H35.033   7. Pseudophakia, both eyes  Z96.1   8. Primary open angle glaucoma of both eyes, unspecified glaucoma stage  H40.1130     1,2. CME OS  - pt with history of complicated CEIOL OS w/ RD in 2016  - history of CME since 2016 previously managed by Dr. Armanda Heritage   - was receiving intravitreal injections per pt report -- last visit w/ Dr. Posey Pronto in January 2021  - BCVA OS today  - OCT shows significant  - history of glaucoma and currently on timolol qdaily OU, Alphagan P BID OU, and Latanoprost qhs OU  - recommend discontinuation of latanoprost -- possibly contributing to CME  - start PF and Prolensa QID OS  - f/u 3-4 weeks, DFE, OCT  3. History of RD OS  - 2016 after cataract surgery Manuella Ghazi  - s/p PPV w/ Dr. Posey Pronto in 2016  - retina attached; appears stable  - monitor  4. PVD / vitreous syneresis OD  - symptomatic floaters OD -- worsening per pt report  - Discussed findings and prognosis  - No RT or RD on 360 peripheral exam  - Reviewed s/s of RT/RD  - Strict return precautions for any such RT/RD signs/symptoms  - f/u in 3-4 wks -- DFE/OCT  5,6. Hypertensive retinopathy OU - discussed importance of tight BP control - monitor  7. Pseudophakia OU  - s/p CE/IOL (OD: Dr. Talbert Forest,  OS: Dr. Ruthell Rummage, 2016)  - s/p YAG OS -- [place], May 2021  - OS with iridonesis and poor dilation - monitor  8. POAG OU  - IOP 15,11 today  - currently on timolol qdaily OU, Alphagan P BID OU, and Latanoprost qhs OU  - recommend stopping latan OU as above  - pt reports irritation w/ timolol   Ophthalmic Meds Ordered this visit:  Meds ordered this encounter  Medications  . prednisoLONE acetate (PRED FORTE) 1 % ophthalmic suspension    Sig: Place 1 drop into the left eye 4 (four) times daily.    Dispense:  15 mL    Refill:  0  . Bromfenac Sodium (PROLENSA) 0.07 %  SOLN    Sig: Place 1 drop into the left eye 4 (four) times daily.    Dispense:  3 mL    Refill:  3       Return in about 4 weeks (around 01/09/2020) for f/u CME OU, DFE, OCT.  There are no Patient Instructions on file for this visit.   Explained the diagnoses, plan, and follow up with the patient and they expressed understanding.  Patient expressed understanding of the importance of proper follow up care.   This document serves as a record of services personally performed by Gardiner Sleeper, MD, PhD. It was created on their behalf by Leonie Douglas, an ophthalmic technician. The creation of this record is the provider's dictation and/or activities during the visit.    Electronically signed by: Leonie Douglas COA, 12/12/19  11:36 AM   This document serves as a record of services personally performed by Gardiner Sleeper, MD, PhD. It was created on their behalf by San Jetty. Owens Shark, OA an ophthalmic technician. The creation of this record is the provider's dictation and/or activities during the visit.    Electronically signed by: San Jetty. Owens Shark, [place] 09.02.2021 11:36 AM   Gardiner Sleeper, M.D., Ph.D. Diseases & Surgery of the Retina and Vitreous [facility]  I have reviewed the above documentation for accuracy and completeness, and I agree with the above. Gardiner Sleeper, M.D., Ph.D. 12/12/19 11:36 AM      Abbreviations: M myopia (nearsighted); A astigmatism; H hyperopia (farsighted); P presbyopia; Mrx spectacle prescription;  CTL contact lenses; OD right eye; OS left eye; OU both eyes  XT exotropia; ET esotropia; PEK punctate epithelial keratitis; PEE punctate epithelial erosions; DES dry eye syndrome; MGD meibomian gland dysfunction; ATs artificial tears; PFAT's preservative free artificial tears; [place] nuclear sclerotic cataract; PSC posterior subcapsular cataract; ERM epi-retinal membrane; PVD posterior vitreous detachment; RD retinal detachment; DM diabetes mellitus; DR diabetic retinopathy; NPDR non-proliferative diabetic retinopathy; PDR proliferative diabetic retinopathy; CSME clinically significant macular edema; DME diabetic macular edema; dbh dot blot hemorrhages; CWS cotton wool spot; POAG primary open angle glaucoma; C/D cup-to-disc ratio; HVF humphrey visual field; GVF goldmann visual field; OCT optical coherence tomography; IOP intraocular pressure; BRVO Branch retinal vein occlusion; CRVO central retinal vein occlusion; CRAO central retinal artery occlusion; BRAO branch retinal artery occlusion; RT retinal tear; SB scleral buckle; PPV pars plana vitrectomy; VH Vitreous hemorrhage; PRP panretinal laser photocoagulation; IVK intravitreal kenalog; VMT vitreomacular traction; MH Macular hole;  NVD neovascularization of the disc; NVE neovascularization elsewhere; AREDS age related eye disease study; ARMD age related macular degeneration; POAG primary open angle glaucoma; EBMD epithelial/anterior basement membrane dystrophy; ACIOL anterior chamber intraocular lens; IOL intraocular lens; PCIOL posterior chamber intraocular lens; Phaco/IOL phacoemulsification with intraocular lens placement; [place] photorefractive keratectomy; LASIK laser assisted in situ keratomileusis; HTN hypertension; DM diabetes mellitus; COPD chronic obstructive pulmonary disease

## 2019-12-12 ENCOUNTER — Other Ambulatory Visit: Payer: Self-pay

## 2019-12-12 ENCOUNTER — Encounter (INDEPENDENT_AMBULATORY_CARE_PROVIDER_SITE_OTHER): Payer: Self-pay | Admitting: Ophthalmology

## 2019-12-12 ENCOUNTER — Ambulatory Visit (INDEPENDENT_AMBULATORY_CARE_PROVIDER_SITE_OTHER): Payer: Medicare Other | Admitting: Ophthalmology

## 2019-12-12 DIAGNOSIS — H35033 Hypertensive retinopathy, bilateral: Secondary | ICD-10-CM

## 2019-12-12 DIAGNOSIS — I10 Essential (primary) hypertension: Secondary | ICD-10-CM

## 2019-12-12 DIAGNOSIS — H43811 Vitreous degeneration, right eye: Secondary | ICD-10-CM

## 2019-12-12 DIAGNOSIS — H3581 Retinal edema: Secondary | ICD-10-CM

## 2019-12-12 DIAGNOSIS — Z961 Presence of intraocular lens: Secondary | ICD-10-CM

## 2019-12-12 DIAGNOSIS — H35352 Cystoid macular degeneration, left eye: Secondary | ICD-10-CM | POA: Diagnosis not present

## 2019-12-12 DIAGNOSIS — Z8669 Personal history of other diseases of the nervous system and sense organs: Secondary | ICD-10-CM | POA: Diagnosis not present

## 2019-12-12 DIAGNOSIS — H401130 Primary open-angle glaucoma, bilateral, stage unspecified: Secondary | ICD-10-CM

## 2019-12-12 MED ORDER — PROLENSA 0.07 % OP SOLN: 1.0000 [drp] | Freq: Four times a day (QID) | OPHTHALMIC | 3 refills | Status: DC

## 2019-12-12 MED ORDER — PREDNISOLONE ACETATE 1 % OP SUSP
1.0000 [drp] | Freq: Four times a day (QID) | OPHTHALMIC | 0 refills | Status: DC
Start: 2019-12-12 — End: 2020-02-14

## 2019-12-12 NOTE — Progress Notes (Signed)
Annual Physical    Patient: Garrett Green, Male    DOB: 1946-04-02, 74 y.o.   MRN: [id number] Visit Date: 12/13/2019  Today's Provider: Lelon Huh, MD   Chief Complaint  Patient presents with  . Annual Exam   Subjective    Garrett Green is a 74 y.o. male who presents today for his complete physical exam.  He reports consuming a general diet. Exercises regularly He generally feels well. He reports sleeping well. He does not have additional problems to discuss today.   HPI Hypertension, follow-up  BP Readings from Last 3 Encounters:  12/13/19 132/70  09/27/19 (!) 143/61  07/09/19 138/70   Wt Readings from Last 3 Encounters:  12/13/19 189 lb (85.7 kg)  09/27/19 185 lb (83.9 kg)  07/09/19 191 lb (86.6 kg)     He was last seen for hypertension 5 months ago.  BP at that visit was 138/70. Management since that visit includes continuing same medications.  He reports excellent compliance with treatment. He is not having side effects.  He is following a Regular diet. He is exercising. He does not smoke.  Use of agents associated with hypertension: NSAIDS.   Outside blood pressures are 140's-80's. Symptoms: No chest pain No chest pressure  No palpitations No syncope  No dyspnea No orthopnea  No paroxysmal nocturnal dyspnea No lower extremity edema   Pertinent labs: Lab Results  Component Value Date   CHOL 107 07/09/2019   HDL 52 07/09/2019   LDLCALC 40 07/09/2019   TRIG 70 07/09/2019   CHOLHDL 2.1 07/09/2019   Lab Results  Component Value Date   NA 139 09/27/2019   K 5.0 09/27/2019   CREATININE 1.05 09/27/2019   GFRNONAA >60 09/27/2019   GFRAA >60 09/27/2019   GLUCOSE 128 (H) 09/27/2019     The ASCVD Risk score Mikey Bussing DC Jr., et al., 2013) failed to calculate for the following reasons:   The patient has a prior MI or stroke diagnosis    ---------------------------------------------------------------------------------------------------  Prediabetes, Follow-up  Lab Results  Component Value Date   HGBA1C 6.0 (H) 07/09/2019   HGBA1C 5.8 (H) 12/11/2018   HGBA1C 6.2 (H) 10/18/2017   GLUCOSE 128 (H) 09/27/2019   GLUCOSE 105 (H) 07/09/2019   GLUCOSE 119 (H) 01/15/2019    Last seen for for this 5 months ago.  Management since that visit includes advising patient to continue heathy diet. Current symptoms include none and have been stable.  Prior visit with dietician: no Current diet: in general, a "healthy" diet   Current exercise: yard work  Pertinent Labs:    Component Value Date/Time   CHOL 107 07/09/2019 0907   TRIG 70 07/09/2019 0907   CHOLHDL 2.1 07/09/2019 0907   CHOLHDL 4.2 10/18/2017 0555   CREATININE 1.05 09/27/2019 1019    Wt Readings from Last 3 Encounters:  12/13/19 189 lb (85.7 kg)  09/27/19 185 lb (83.9 kg)  07/09/19 191 lb (86.6 kg)    -----------------------------------------------------------------------------------------  Lipid/Cholesterol, Follow-up  Last lipid panel Other pertinent labs  Lab Results  Component Value Date   CHOL 107 07/09/2019   HDL 52 07/09/2019   LDLCALC 40 07/09/2019   TRIG 70 07/09/2019   CHOLHDL 2.1 07/09/2019   Lab Results  Component Value Date   ALT 19 07/09/2019   AST 21 07/09/2019   PLT 183 09/27/2019     He was last seen for this 8 months ago.  Management since that visit includes continue same medication.  He reports excellent compliance with treatment. He is not having side effects.   Symptoms: No chest pain No chest pressure/discomfort  No dyspnea No lower extremity edema  No numbness or tingling of extremity No orthopnea  No palpitations No paroxysmal nocturnal dyspnea  No speech difficulty No syncope   Current diet: in general, a "healthy" diet   Current exercise: yard work  The ASCVD Risk score Mikey Bussing DC Jr., et al., 2013) failed  to calculate for the following reasons:   The patient has a prior MI or stroke diagnosis  ---------------------------------------------------------------------------------------------------  Follow up for Insomnia:  The patient was last seen for this 8 months ago. Changes made at last visit include none. Doing well with prn zolpidem.  He reports excellent compliance with treatment. He feels that condition is Improved. He is not having side effects.   -----------------------------------------------------------------------------------------      Medications: Outpatient Medications Prior to Visit  Medication Sig  . aspirin 325 MG tablet Take 325 mg by mouth daily.  Marland Kitchen atorvastatin (LIPITOR) 40 MG tablet Take 1 tablet (40 mg total) by mouth daily at 6 PM.  . brimonidine (ALPHAGAN P) 0.1 % SOLN Place 1 drop into the left eye 2 (two) times daily.  . Bromfenac Sodium (PROLENSA) 0.07 % SOLN Place 1 drop into the left eye 4 (four) times daily.  . clopidogrel (PLAVIX) 75 MG tablet Take 1 tablet (75 mg total) by mouth daily.  . fluticasone (FLONASE) 50 MCG/ACT nasal spray Place 2 sprays into both nostrils daily as needed for allergies.  Marland Kitchen HYDROcodone-acetaminophen (NORCO/VICODIN) 5-325 MG tablet Take 1 tablet by mouth every 6 (six) hours as needed.  . latanoprost (XALATAN) 0.005 % ophthalmic solution Place 1 drop into both eyes at bedtime.  Marland Kitchen loratadine (CLARITIN) 10 MG tablet Take 10 mg by mouth daily as needed for allergies.  Marland Kitchen LORazepam (ATIVAN) 1 MG tablet Take 1 tablet (1 mg total) by mouth 3 (three) times daily as needed. (Patient taking differently: Take 1 mg by mouth as directed. Only for flying)  . prednisoLONE acetate (PRED FORTE) 1 % ophthalmic suspension Place 1 drop into the left eye 4 (four) times daily.  . ramipril (ALTACE) 10 MG capsule TAKE 1 CAPSULE BY MOUTH  DAILY (Patient taking differently: Take 10 mg by mouth daily. )  . timolol (TIMOPTIC) 0.5 % ophthalmic solution INSTILL  ONE DROP TO THE AFFECTED EYE TWICE DAILY  . zolpidem (AMBIEN) 10 MG tablet Take 0.5-1 tablets (5-10 mg total) by mouth at bedtime as needed for sleep.  . benzonatate (TESSALON) 200 MG capsule Take by mouth.   No facility-administered medications prior to visit.    Allergies  Allergen Reactions  . Sulfa Antibiotics Other (See Comments)    Lethargic and hypotension    Patient Care Team: Birdie Sons, MD as PCP - General (Family Medicine) Oh, Lupita Dawn, MD (Inactive) as Consulting Physician (Gastroenterology) Jalene Mullet, MD as Consulting Physician (Ophthalmology) Garvin Fila, MD as Consulting Physician (Neurology) Frann Rider, NP as Nurse Practitioner (Neurology) Luanne Bras, MD as Consulting Physician (Interventional Radiology) Brendolyn Patty, MD (Dermatology)  Review of Systems  Constitutional: Negative.   HENT: Negative.   Eyes: Negative.   Respiratory: Negative.   Cardiovascular: Negative.   Gastrointestinal: Negative.   Endocrine: Negative.   Genitourinary: Negative.   Musculoskeletal: Negative.   Skin: Negative.   Allergic/Immunologic: Negative.   Neurological: Negative.   Hematological: Negative.   Psychiatric/Behavioral: Negative.       Objective  Vitals: BP 132/70 (BP Location: Right Arm, Patient Position: Sitting, Cuff Size: Large)   Pulse 62   Temp 97.9 F (36.6 C) (Oral)   Ht 5\' 8"  (1.727 m)   Wt 189 lb (85.7 kg)   SpO2 99%   BMI 28.74 kg/m    Physical Exam   General Appearance:     Overweight male. Alert, cooperative, in no acute distress, appears stated age  Head:    Normocephalic, without obvious abnormality, atraumatic  Eyes:    PERRL, conjunctiva/corneas clear, EOM's intact, fundi    benign, both eyes       Ears:    Normal TM's and external ear canals, both ears  Nose:   Nares normal, septum midline, mucosa normal, no drainage   or sinus tenderness  Throat:   Lips, mucosa, and tongue normal; teeth and gums normal   Neck:   Supple, symmetrical, trachea midline, no adenopathy;       thyroid:  No enlargement/tenderness/nodules; no carotid   bruit or JVD  Back:     Symmetric, no curvature, ROM normal, no CVA tenderness  Lungs:     Clear to auscultation bilaterally, respirations unlabored  Chest wall:    No tenderness or deformity  Heart:    Normal heart rate. Normal rhythm. No murmurs, rubs, or gallops.  S1 and S2 normal  Abdomen:     Soft, non-tender, bowel sounds active all four quadrants,    no masses, no organomegaly  Genitalia:    deferred  Rectal:    deferred  Extremities:   All extremities are intact. No cyanosis or edema  Pulses:   2+ and symmetric all extremities  Skin:   Skin color, texture, turgor normal, no rashes or lesions  Lymph nodes:   Cervical, supraclavicular, and axillary nodes normal  Neurologic:   CNII-XII intact. Normal strength, sensation and reflexes      throughout     Most recent functional status assessment: In your present state of health, do you have any difficulty performing the following activities: 12/13/2019  Hearing? N  Vision? N  Difficulty concentrating or making decisions? N  Walking or climbing stairs? N  Dressing or bathing? N  Doing errands, shopping? N  Some recent data might be hidden   Most recent fall risk assessment: Fall Risk  12/13/2019  Falls in the past year? 0  Number falls in past yr: 0  Injury with Fall? 0  Follow up Falls evaluation completed    Most recent depression screenings: PHQ 2/9 Scores 12/13/2019 12/11/2018  PHQ - 2 Score 0 0  PHQ- 9 Score 3 -   Most recent cognitive screening: No flowsheet data found. Most recent Audit-C alcohol use screening Alcohol Use Disorder Test (AUDIT) 12/11/2018  1. How often do you have a drink containing alcohol? 2  2. How many drinks containing alcohol do you have on a typical day when you are drinking? 0  3. How often do you have six or more drinks on one occasion? 0  AUDIT-C Score 2  Alcohol Brief  Interventions/Follow-up AUDIT Score <7 follow-up not indicated   A score of 3 or more in women, and 4 or more in men indicates increased risk for alcohol abuse, EXCEPT if all of the points are from question 1   No results found for any visits on 12/13/19.  Assessment & Plan     Annual wellness visit done today including the all of the following: Reviewed patient's Family Medical History Reviewed and updated  list of patient's medical providers Assessment of cognitive impairment was done Assessed patient's functional ability Established a written schedule for health screening [place] Completed and Reviewed  Exercise Activities and Dietary recommendations Goals   None     Immunization History  Administered Date(s) Administered  . Fluad Quad(high Dose 65+) 12/11/2018  . Influenza, High Dose Seasonal PF 11/30/2016, 02/12/2018  . Pneumococcal Conjugate-13 10/23/2013  . Pneumococcal Polysaccharide-23 10/27/2014  . Td 11/26/2015  . Tdap 06/20/2005  . Zoster 09/02/2009    Health Maintenance  Topic Date Due  . COVID-19 Vaccine (1) Never done  . INFLUENZA VACCINE  11/10/2019  . TETANUS/TDAP  11/25/2025  . COLONOSCOPY  03/16/2026  . Hepatitis C Screening  Completed  . PNA vac Low Risk Adult  Completed     Discussed health benefits of physical activity, and encouraged him to engage in regular exercise appropriate for his age and condition.    1. Annual physical exam  - EKG 12-Lead  2. Prediabetes  - Hemoglobin A1c  3. Essential (primary) hypertension  - EKG 12-Lead  4. Hyperlipidemia, unspecified hyperlipidemia type He is tolerating atorvastatin well with no adverse effects.    5. Insomnia, unspecified type He is doing well usually taking 1/2 tablet overnight on most nights. He does need refill.  - zolpidem (AMBIEN) 10 MG tablet; Take 0.5-1 tablets (5-10 mg total) by mouth at bedtime as needed for sleep.  Dispense: 90 tablet; Refill: 1  6.  Prostate cancer screening  - PSA Total (Reflex To Free) ([facility] only)     The entirety of the information documented in the History of Present Illness, Review of Systems and Physical Exam were personally obtained by me. Portions of this information were initially documented by the CMA and reviewed by me for thoroughness and accuracy.      Lelon Huh, MD  [facility] [phone] (phone) [phone] (fax)  [place]

## 2019-12-13 ENCOUNTER — Ambulatory Visit (INDEPENDENT_AMBULATORY_CARE_PROVIDER_SITE_OTHER): Payer: Medicare Other | Admitting: Family Medicine

## 2019-12-13 ENCOUNTER — Encounter: Payer: Self-pay | Admitting: Family Medicine

## 2019-12-13 VITALS — BP 132/70 | HR 62 | Temp 97.9°F | Ht 68.0 in | Wt 189.0 lb

## 2019-12-13 DIAGNOSIS — I10 Essential (primary) hypertension: Secondary | ICD-10-CM | POA: Diagnosis not present

## 2019-12-13 DIAGNOSIS — Z0000 Encounter for general adult medical examination without abnormal findings: Secondary | ICD-10-CM | POA: Diagnosis not present

## 2019-12-13 DIAGNOSIS — Z125 Encounter for screening for malignant neoplasm of prostate: Secondary | ICD-10-CM | POA: Diagnosis not present

## 2019-12-13 DIAGNOSIS — Z6741 Type O blood, Rh negative: Secondary | ICD-10-CM | POA: Insufficient documentation

## 2019-12-13 DIAGNOSIS — G4700 Insomnia, unspecified: Secondary | ICD-10-CM

## 2019-12-13 DIAGNOSIS — R7303 Prediabetes: Secondary | ICD-10-CM | POA: Diagnosis not present

## 2019-12-13 DIAGNOSIS — E785 Hyperlipidemia, unspecified: Secondary | ICD-10-CM

## 2019-12-13 MED ORDER — ZOLPIDEM TARTRATE 10 MG PO TABS: 5.0000 mg | ORAL_TABLET | Freq: Every evening | ORAL | 1 refills | Status: DC | PRN

## 2019-12-13 NOTE — Progress Notes (Signed)
Annual Wellness Visit     Patient: Garrett Green, Male    DOB: [birth year], 74 y.o.   MRN: [id number] Visit Date: 12/13/2019  Today's Provider: Lelon Huh, MD   Chief Complaint  Patient presents with  . Annual Exam   Subjective    Garrett Green is a 74 y.o. male who presents today for his Annual Wellness Visit.   Patient Active Problem List   Diagnosis Date Noted  . Glaucoma 12/01/2017  . 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 Hyperlipidemia 12/01/2017  . Cerebral artery disease 11/07/2017  . 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 Mild concentric left ventricular hypertrophy (LVH) 10/19/2017  . Prediabetes 11/30/2016  . Spondylosis 10/22/2014  . Dysphagia 10/22/2014  . Insomnia 10/22/2014  . Fam hx-ischem heart disease 08/13/2007  . 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 Arthralgia of hip or thigh 09/26/2006  . Pain in hip 09/26/2006  . Allergic rhinitis 09/17/2001  . GERD (gastroesophageal reflux disease) 11/14/1994  . Essential (primary) hypertension 11/14/1994   Past Medical History:  Diagnosis Date  . COVID-19 07/2019  . History of blood transfusion 2014   "related to back OR"  . History of chicken pox   . Stroke ([place])   . TIA (transient ischemic attack) 10/17/2017; 10/19/2017   Past Surgical History:  Procedure Laterality Date  . BACK SURGERY    . CATARACT EXTRACTION Right 05/2019   Dr. Talbert Forest  . CATARACT EXTRACTION Left 2017  . CATARACT EXTRACTION W/ INTRAOCULAR LENS IMPLANT Left   . COLONOSCOPY WITH PROPOFOL N/A 03/16/2016   Procedure: COLONOSCOPY WITH PROPOFOL;  Surgeon: Manya Silvas, MD;  Location[facility] ENDOSCOPY;  Service: Endoscopy;  Laterality: N/A;  . ESOPHAGOGASTRODUODENOSCOPY (EGD) WITH ESOPHAGEAL DILATION  1990s  . EYE SURGERY Bilateral    Cat Sx  . IR ANGIO INTRA EXTRACRAN SEL COM CAROTID INNOMINATE BILAT MOD SED  10/31/2017  . IR ANGIO INTRA EXTRACRAN SEL COM CAROTID INNOMINATE BILAT MOD SED  01/15/2019  . IR ANGIO INTRA  EXTRACRAN SEL COM CAROTID INNOMINATE BILAT MOD SED  09/27/2019  . IR ANGIO VERTEBRAL SEL VERTEBRAL BILAT MOD SED  10/31/2017  . IR ANGIO VERTEBRAL SEL VERTEBRAL BILAT MOD SED  01/15/2019  . IR ANGIO VERTEBRAL SEL VERTEBRAL BILAT MOD SED  09/27/2019  . IR US GUIDE VASC ACCESS RIGHT  01/15/2019  . IR US GUIDE VASC ACCESS RIGHT  09/27/2019  . LUMBAR LAMINECTOMY/DECOMPRESSION MICRODISCECTOMY Left 07/26/2012   Procedure: LUMBAR LAMINECTOMY/DECOMPRESSION MICRODISCECTOMY 1 LEVEL;  Surgeon: Eustace Moore, MD;  Location: [facility] NEURO ORS;  Service: Neurosurgery;  Laterality: Left;  lumbar five sacral one  . SHOULDER ARTHROSCOPY WITH OPEN ROTATOR CUFF REPAIR Left 1990s  . TONSILLECTOMY     Social History   Socioeconomic History  . Marital status: Married    Spouse name: Not on file  . Number of children: Not on file  . Years of education: Not on file  . Highest education level: Not on file  Occupational History    Employer: RETIRED  Tobacco Use  . Smoking status: Former Smoker    Packs/day: 1.00    Years: 25.00    Pack years: 25.00    Quit date: 03/03/1988    Years since quitting: 31.8  . Smokeless tobacco: Never Used  Vaping Use  . Vaping Use: Never used  Substance and Sexual Activity  . Alcohol use: Yes    Alcohol/week: 1.0 standard drink    Types: 1 Standard drinks or equivalent  per week  . Drug use: Never  . Sexual activity: Yes  Other Topics Concern  . Not on file  Social History Narrative  . Not on file   Social Determinants of Health   Financial Resource Strain:   . Difficulty of Paying Living Expenses: Not on file  Food Insecurity:   . Worried About Charity fundraiser in the Last Year: Not on file  . Ran Out of Food in the Last Year: Not on file  Transportation Needs:   . Lack of Transportation (Medical): Not on file  . Lack of Transportation (Non-Medical): Not on file  Physical Activity:   . Days of Exercise per Week: Not on file  . Minutes of Exercise per Session: Not on  file  Stress:   . Feeling of Stress : Not on file  Social Connections:   . Frequency of Communication with Friends and Family: Not on file  . Frequency of Social Gatherings with Friends and Family: Not on file  . Attends Religious Services: Not on file  . Active Member of Clubs or Organizations: Not on file  . Attends Archivist Meetings: Not on file  . Marital Status: Not on file  Intimate Partner Violence:   . Fear of Current or Ex-Partner: Not on file  . Emotionally Abused: Not on file  . Physically Abused: Not on file  . Sexually Abused: Not on file   Family Status  Relation Name Status  . Mother  Deceased  . Father  Deceased  . Sister  Deceased  . Brother  Deceased  . Sister  Deceased  . Sister  Deceased  . Brother  Alive  . Sister  Alive  . Neg Hx  (Not Specified)   Family History  Problem Relation Age of Onset  . Heart disease Mother   . Cancer Sister   . Dementia Brother   . Dementia Sister   . Diabetes Brother   . Diabetes Sister   . Colon cancer Neg Hx   . Prostate cancer Neg Hx        Medications: Outpatient Medications Prior to Visit  Medication Sig  . aspirin 325 MG tablet Take 325 mg by mouth daily.  Marland Kitchen atorvastatin (LIPITOR) 40 MG tablet Take 1 tablet (40 mg total) by mouth daily at 6 PM.  . brimonidine (ALPHAGAN P) 0.1 % SOLN Place 1 drop into the left eye 2 (two) times daily.  . Bromfenac Sodium (PROLENSA) 0.07 % SOLN Place 1 drop into the left eye 4 (four) times daily.  . clopidogrel (PLAVIX) 75 MG tablet Take 1 tablet (75 mg total) by mouth daily.  . fluticasone (FLONASE) 50 MCG/ACT nasal spray Place 2 sprays into both nostrils daily as needed for allergies.  Marland Kitchen HYDROcodone-acetaminophen (NORCO/VICODIN) 5-325 MG tablet Take 1 tablet by mouth every 6 (six) hours as needed.  . latanoprost (XALATAN) 0.005 % ophthalmic solution Place 1 drop into both eyes at bedtime.  Marland Kitchen loratadine (CLARITIN) 10 MG tablet Take 10 mg by mouth daily as needed  for allergies.  Marland Kitchen LORazepam (ATIVAN) 1 MG tablet Take 1 tablet (1 mg total) by mouth 3 (three) times daily as needed. (Patient taking differently: Take 1 mg by mouth as directed. Only for flying)  . prednisoLONE acetate (PRED FORTE) 1 % ophthalmic suspension Place 1 drop into the left eye 4 (four) times daily.  . ramipril (ALTACE) 10 MG capsule TAKE 1 CAPSULE BY MOUTH  DAILY (Patient taking differently: Take 10 mg  by mouth daily. )  . timolol (TIMOPTIC) 0.5 % ophthalmic solution INSTILL ONE DROP TO THE AFFECTED EYE TWICE DAILY  . [DISCONTINUED] zolpidem (AMBIEN) 10 MG tablet Take 0.5-1 tablets (5-10 mg total) by mouth at bedtime as needed for sleep.  . [DISCONTINUED] benzonatate (TESSALON) 200 MG capsule Take by mouth.   No facility-administered medications prior to visit.    Allergies  Allergen Reactions  . Sulfa Antibiotics Other (See Comments)    Lethargic and hypotension    Patient Care Team: Birdie Sons, MD as PCP - General (Family Medicine) Oh, Lupita Dawn, MD (Inactive) as Consulting Physician (Gastroenterology) Jalene Mullet, MD as Consulting Physician (Ophthalmology) Garvin Fila, MD as Consulting Physician (Neurology) Frann Rider, NP as Nurse Practitioner (Neurology) Luanne Bras, MD as Consulting Physician (Interventional Radiology) Brendolyn Patty, MD (Dermatology)    Objective      Most recent functional status assessment: In your present state of health, do you have any difficulty performing the following activities: 12/13/2019  Hearing? N  Vision? N  Difficulty concentrating or making decisions? N  Walking or climbing stairs? N  Dressing or bathing? N  Doing errands, shopping? N  Some recent data might be hidden   Most recent fall risk assessment: Fall Risk  12/13/2019  Falls in the past year? 0  Number falls in past yr: 0  Injury with Fall? 0  Follow up Falls evaluation completed    Most recent depression screenings: PHQ 2/9 Scores 12/13/2019  12/11/2018  PHQ - 2 Score 0 0  PHQ- 9 Score 3 -   Most recent cognitive screening: No flowsheet data found. Most recent Audit-C alcohol use screening Alcohol Use Disorder Test (AUDIT) 12/11/2018  1. How often do you have a drink containing alcohol? 2  2. How many drinks containing alcohol do you have on a typical day when you are drinking? 0  3. How often do you have six or more drinks on one occasion? 0  AUDIT-C Score 2  Alcohol Brief Interventions/Follow-up AUDIT Score <7 follow-up not indicated   A score of 3 or more in women, and 4 or more in men indicates increased risk for alcohol abuse, EXCEPT if all of the points are from question 1   No results found for any visits on 12/13/19.  Assessment & Plan     Annual wellness visit done today including the all of the following: Reviewed patient's Family Medical History Reviewed and updated list of patient's medical providers Assessment of cognitive impairment was done Assessed patient's functional ability Established a written schedule for health screening [place] Completed and Reviewed  Exercise Activities and Dietary recommendations Goals   None     Immunization History  Administered Date(s) Administered  . Fluad Quad(high Dose 65+) 12/11/2018  . Influenza, High Dose Seasonal PF 11/30/2016, 02/12/2018  . Pneumococcal Conjugate-13 10/23/2013  . Pneumococcal Polysaccharide-23 10/27/2014  . Td 11/26/2015  . Tdap 06/20/2005  . Zoster 09/02/2009    Health Maintenance  Topic Date Due  . COVID-19 Vaccine (1) Never done  . INFLUENZA VACCINE  11/10/2019  . TETANUS/TDAP  11/25/2025  . COLONOSCOPY  03/16/2026  . Hepatitis C Screening  Completed  . PNA vac Low Risk Adult  Completed     Discussed health benefits of physical activity, and encouraged him to engage in regular exercise appropriate for his age and condition.        The entirety of the information documented in the History of Present  Illness, Review  of Systems and Physical Exam were personally obtained by me. Portions of this information were initially documented by the CMA and reviewed by me for thoroughness and accuracy.      Lelon Huh, MD  [facility] [phone] (phone) [phone] (fax)  [place]

## 2019-12-14 LAB — HEMOGLOBIN A1C
Est. average glucose Bld gHb Est-mCnc: 140 mg/dL
Hgb A1c MFr Bld: 6.5 % — ABNORMAL HIGH (ref 4.8–5.6)

## 2019-12-14 LAB — PSA TOTAL (REFLEX TO FREE): Prostate Specific Ag, Serum: 0.7 ng/mL (ref 0.0–4.0)

## 2019-12-31 ENCOUNTER — Encounter: Payer: Self-pay | Admitting: Family Medicine

## 2020-01-13 NOTE — Progress Notes (Signed)
[facility] Note  01/17/2020     CHIEF COMPLAINT Patient presents for Retina Follow Up   HISTORY OF PRESENT ILLNESS: Garrett Green is a 74 y.o. male who presents to the clinic today for:   HPI    Retina Follow Up    Patient presents with  Other.  In left eye.  This started 5 weeks ago.  I, the attending physician,  performed the HPI with the patient and updated documentation appropriately.          Comments    Patient here for 5 week retina follow up for CME OS. Patient states vision the medicine has helped OS. Can see tv better. No eye pain.        Last edited by Bernarda Caffey, MD on 01/18/2020  6:22 PM. (History)    pt states he feels like his vision has improved, he states he is still using PF and Prolensa QID  Referring physician: Birdie Sons, MD [address] [place],  [place] [address]  HISTORICAL INFORMATION:   Selected notes from the MEDICAL RECORD NUMBER Referred by Dr. Truman Hayward:  Ocular Hx- PMH-    CURRENT MEDICATIONS: Current Outpatient Medications (Ophthalmic Drugs)  Medication Sig  . brimonidine (ALPHAGAN P) 0.1 % SOLN Place 1 drop into the left eye 2 (two) times daily.  . Bromfenac Sodium (PROLENSA) 0.07 % SOLN Place 1 drop into the left eye 4 (four) times daily.  Marland Kitchen latanoprost (XALATAN) 0.005 % ophthalmic solution Place 1 drop into both eyes at bedtime.  . prednisoLONE acetate (PRED FORTE) 1 % ophthalmic suspension Place 1 drop into the left eye 4 (four) times daily.  . timolol (TIMOPTIC) 0.5 % ophthalmic solution INSTILL ONE DROP TO THE AFFECTED EYE TWICE DAILY   No current facility-administered medications for this visit. (Ophthalmic Drugs)   Current Outpatient Medications (Other)  Medication Sig  . aspirin 325 MG tablet Take 325 mg by mouth daily.  Marland Kitchen atorvastatin (LIPITOR) 40 MG tablet Take 1 tablet (40 mg total) by mouth daily at 6 PM.  . clopidogrel (PLAVIX) 75 MG tablet Take 1 tablet (75 mg total) by  mouth daily.  . fluticasone (FLONASE) 50 MCG/ACT nasal spray Place 2 sprays into both nostrils daily as needed for allergies.  Marland Kitchen HYDROcodone-acetaminophen (NORCO/VICODIN) 5-325 MG tablet Take 1 tablet by mouth every 6 (six) hours as needed.  . loratadine (CLARITIN) 10 MG tablet Take 10 mg by mouth daily as needed for allergies.  Marland Kitchen LORazepam (ATIVAN) 1 MG tablet Take 1 tablet (1 mg total) by mouth 3 (three) times daily as needed. (Patient taking differently: Take 1 mg by mouth as directed. Only for flying)  . ramipril (ALTACE) 10 MG capsule TAKE 1 CAPSULE BY MOUTH  DAILY (Patient taking differently: Take 10 mg by mouth daily. )  . zolpidem (AMBIEN) 10 MG tablet Take 0.5-1 tablets (5-10 mg total) by mouth at bedtime as needed for sleep.   No current facility-administered medications for this visit. (Other)      REVIEW OF SYSTEMS: ROS    Positive for: Gastrointestinal, Neurological, Musculoskeletal, Cardiovascular, Eyes   Negative for: Constitutional, Skin, Genitourinary, HENT, Endocrine, Respiratory, Psychiatric, Allergic/Imm, Heme/Lymph   Last edited by Theodore Demark, COA on 01/17/2020  9:12 AM. (History)       ALLERGIES Allergies  Allergen Reactions  . Sulfa Antibiotics Other (See Comments)    Lethargic and hypotension    PAST MEDICAL HISTORY Past Medical History:  Diagnosis Date  . COVID-19 07/2019  . History of blood transfusion 2014   "related to back OR"  . History of chicken pox   . Stroke ([place])   . TIA (transient ischemic attack) 10/17/2017; 10/19/2017   Past Surgical History:  Procedure Laterality Date  . BACK SURGERY    . CATARACT EXTRACTION Right 05/2019   Dr. Talbert Forest  . CATARACT EXTRACTION Left 2017  . CATARACT EXTRACTION W/ INTRAOCULAR LENS IMPLANT Left   . COLONOSCOPY WITH PROPOFOL N/A 03/16/2016   Procedure: COLONOSCOPY WITH PROPOFOL;  Surgeon: Manya Silvas, MD;  Location: [facility] END[facility]COPY;  Service: Endoscopy;  Laterality: N/A;  .  ESOPHAGOGASTRODUODENOSCOPY (EGD) WITH ESOPHAGEAL DILATION  1990s  . EYE SURGERY Bilateral    Cat Sx  . IR ANGIO INTRA EXTRACRAN SEL COM CAROTID INNOMINATE BILAT MOD SED  10/31/2017  . IR ANGIO INTRA EXTRACRAN SEL COM CAROTID INNOMINATE BILAT MOD SED  01/15/2019  . IR ANGIO INTRA EXTRACRAN SEL COM CAROTID INNOMINATE BILAT MOD SED  09/27/2019  . IR ANGIO VERTEBRAL SEL VERTEBRAL BILAT MOD SED  10/31/2017  . IR ANGIO VERTEBRAL SEL VERTEBRAL BILAT MOD SED  01/15/2019  . IR ANGIO VERTEBRAL SEL VERTEBRAL BILAT MOD SED  09/27/2019  . IR US GUIDE VASC ACCESS RIGHT  01/15/2019  . IR US GUIDE VASC ACCESS RIGHT  09/27/2019  . LUMBAR LAMINECTOMY/DECOMPRESSION MICRODISCECTOMY Left 07/26/2012   Procedure: LUMBAR LAMINECTOMY/DECOMPRESSION MICRODISCECTOMY 1 LEVEL;  Surgeon: Eustace Moore, MD;  Location: [facility];  Service: Neurosurgery;  Laterality: Left;  lumbar five sacral one  . SHOULDER ARTHROSCOPY WITH OPEN ROTATOR CUFF REPAIR Left 1990s  . TONSILLECTOMY      FAMILY HISTORY Family History  Problem Relation Age of Onset  . Heart disease Mother   . Cancer Sister   . Dementia Brother   . Dementia Sister   . Diabetes Brother   . Diabetes Sister   . Colon cancer Neg Hx   . Prostate cancer Neg Hx     SOCIAL HISTORY Social History   Tobacco Use  . Smoking status: Former Smoker    Packs/day: 1.00    Years: 25.00    Pack years: 25.00    Quit date: 03/03/1988    Years since quitting: 31.8  . Smokeless tobacco: Never Used  Vaping Use  . Vaping Use: Never used  Substance Use Topics  . Alcohol use: Yes    Alcohol/week: 1.0 standard drink    Types: 1 Standard drinks or equivalent per week  . Drug use: Never         OPHTHALMIC EXAM:  Base Eye Exam    Visual Acuity (Snellen - Linear)      Right Left   Dist [place] 20/20 -1 20/100 +2   Dist ph [place]  20/80       Tonometry (Tonopen, 9:09 AM)      Right Left   Pressure 20 17       Pupils      Dark Light Shape React APD   Right 3 2 Round  Brisk None   Left 2 1 Round Brisk None       Visual Fields (Counting fingers)      Left Right    Full Full       Extraocular Movement      Right Left    Full, Ortho Full, Ortho       Neuro/Psych    Oriented x3: Yes   Mood/Affect: Normal  Dilation    Both eyes: 1.0% Mydriacyl, 2.5% Phenylephrine @ 9:09 AM        Slit Lamp and Fundus Exam    Slit Lamp Exam      Right Left   Lids/Lashes Dermatochalasis - upper lid Dermatochalasis - upper lid   Conjunctiva/Sclera Mild nasal Pinguecula 1+ Injection, prominent limbal vessels nasally   Cornea Mild arcus, well healed temporal cataract wounds, 2+ inferior Punctate epithelial erosions, trace Debris in tear film Mild arcus, well healed temporal cataract wounds, 3+ Punctate epithelial erosions, trace Debris in tear film   Anterior Chamber Deep and quiet Deep, 0.5+pigment   Iris Round and dilated mild iridodonesis, Round and poorly dilated to 3.0   Lens PC IOL in good position PC IOL with fine pigment depositon, open PC   Vitreous Vitreous syneresis, Posterior vitreous detachment, vitreous condensations Vitreous syneresis       Fundus Exam      Right Left   Disc Pink and Sharp Pink and Sharp   C/D Ratio 0.5 0.6   Macula Flat, Blunted foveal reflex, mild RPE mottling, No heme or edema Blunted foveal reflex, central CME - improved, +ERM, no heme   Vessels mild attenuation, mild tortuousity, mild AV crossing changes mild attenuation, mild tortuousity, mild AV crossing changes   Periphery Attached    limited view due to poor dilates, attached, no heme, inferior paving stone degeneration / CR atrophy          IMAGING AND PROCEDURES  Imaging and Procedures for 01/17/2020  OCT, Retina - OU - Both Eyes       Right Eye Quality was good. Central Foveal Thickness: 321. Progression has been stable. Findings include normal foveal contour, no IRF, no SRF.   Left Eye Quality was good. Central Foveal Thickness: 465. Progression has  improved. Findings include abnormal foveal contour, intraretinal fluid, epiretinal membrane, no SRF (Interval improvement in IRF/SRF and foveal contour).   Notes *Images captured and stored on drive  Diagnosis / Impression:  OD: NFP, no IRF/SRF OS: CME w/ Interval improvement in IRF/SRF and foveal contour   Clinical management:  See below  Abbreviations: NFP - Normal foveal profile. CME - cystoid macular edema. PED - pigment epithelial detachment. IRF - intraretinal fluid. SRF - subretinal fluid. EZ - ellipsoid zone. ERM - epiretinal membrane. ORA - outer retinal atrophy. ORT - outer retinal tubulation. SRHM - subretinal hyper-reflective material. IRHM - intraretinal hyper-reflective material                 ASSESSMENT/PLAN:    ICD-10-CM   1. Cystoid macular edema of left eye  H35.352   2. Retinal edema  H35.81 OCT, Retina - OU - Both Eyes  3. History of retinal detachment  Z86.69   4. Posterior vitreous detachment of right eye  H43.811   5. Essential hypertension  I10   6. Hypertensive retinopathy of both eyes  H35.033   7. Pseudophakia, both eyes  Z96.1   8. Primary open angle glaucoma of both eyes, unspecified glaucoma stage  H40.1130     1,2. CME OS  - pt with history of complicated CEIOL OS w/ RD in 2016  - history of CME since 2016 previously managed by Dr. Armanda Heritage   - was receiving intravitreal injections per pt report -- last visit w/ Dr. Posey Pronto in January 2021  - started on PF and Prolensa QID OS on 9.2.21  - BCVA OS improved to 20/80 from 20/150 today  - OCT  shows interval improvement in IRF/SRF and foveal profile OS  - history of glaucoma and currently on timolol qdaily OU, Alphagan P BID OU, and Latanoprost qhs OU  - recommended discontinuation of latanoprost -- possibly contributing to CME  - continue PF and Prolensa QID OS  - f/u 4-5 weeks, DFE, OCT  3. History of RD OS  - 2016 after cataract surgery Manuella Ghazi)  - s/p PPV w/ Dr. Posey Pronto in 2016  - retina  attached; appears stable  - monitor  4. PVD / vitreous syneresis OD  - symptomatic floaters OD -- worsening per pt report  - Discussed findings and prognosis  - No RT or RD on 360 peripheral exam  - Reviewed s/s of RT/RD  - Strict return precautions for any such RT/RD signs/symptoms  - f/u in 4-5 wks -- DFE/OCT  5,6. Hypertensive retinopathy OU - discussed importance of tight BP control - monitor  7. Pseudophakia OU  - s/p CE/IOL (OD: Dr. Talbert Forest, OS: Dr. Ruthell Rummage, 2016)  - s/p YAG OS -- [place], May 2021  - OS with iridonesis and poor dilation - monitor  8. POAG OU  - IOP 20,17 today  - currently on timolol qdaily OU, Alphagan P BID OU, and Latanoprost qhs OU  - recommend stopping latan OU as above  - pt reports irritation w/ timolol   Ophthalmic Meds Ordered this visit:  Meds ordered this encounter  Medications  . Bromfenac Sodium (PROLENSA) 0.07 % SOLN    Sig: Place 1 drop into the left eye 4 (four) times daily.    Dispense:  6 mL    Refill:  3       Return for f/u 4-5 weeks, CME OS.  There are no Patient Instructions on file for this visit.   Explained the diagnoses, plan, and follow up with the patient and they expressed understanding.  Patient expressed understanding of the importance of proper follow up care.   This document serves as a record of services personally performed by Gardiner Sleeper, MD, PhD. It was created on their behalf by San Jetty. Owens Shark, OA an ophthalmic technician. The creation of this record is the provider's dictation and/or activities during the visit.    Electronically signed by: San Jetty. Owens Shark, [place] 10.04.2021 6:28 PM   Gardiner Sleeper, M.D., Ph.D. Diseases & Surgery of the Retina and Vitreous [facility]  I have reviewed the above documentation for accuracy and completeness, and I agree with the above. Gardiner Sleeper, M.D., Ph.D. 01/18/20 6:28 PM   Abbreviations: M myopia (nearsighted); A astigmatism; H  hyperopia (farsighted); P presbyopia; Mrx spectacle prescription;  CTL contact lenses; OD right eye; OS left eye; OU both eyes  XT exotropia; ET esotropia; PEK punctate epithelial keratitis; PEE punctate epithelial erosions; DES dry eye syndrome; MGD meibomian gland dysfunction; ATs artificial tears; PFAT's preservative free artificial tears; [place] nuclear sclerotic cataract; PSC posterior subcapsular cataract; ERM epi-retinal membrane; PVD posterior vitreous detachment; RD retinal detachment; DM diabetes mellitus; DR diabetic retinopathy; NPDR non-proliferative diabetic retinopathy; PDR proliferative diabetic retinopathy; CSME clinically significant macular edema; DME diabetic macular edema; dbh dot blot hemorrhages; CWS cotton wool spot; POAG primary open angle glaucoma; C/D cup-to-disc ratio; HVF humphrey visual field; GVF goldmann visual field; OCT optical coherence tomography; IOP intraocular pressure; BRVO Branch retinal vein occlusion; CRVO central retinal vein occlusion; CRAO central retinal artery occlusion; BRAO branch retinal artery occlusion; RT retinal tear; SB scleral buckle; PPV pars plana vitrectomy; VH  Vitreous hemorrhage; PRP panretinal laser photocoagulation; IVK intravitreal kenalog; VMT vitreomacular traction; MH Macular hole;  NVD neovascularization of the disc; NVE neovascularization elsewhere; AREDS age related eye disease study; ARMD age related macular degeneration; POAG primary open angle glaucoma; EBMD epithelial/anterior basement membrane dystrophy; ACIOL anterior chamber intraocular lens; IOL intraocular lens; PCIOL posterior chamber intraocular lens; Phaco/IOL phacoemulsification with intraocular lens placement; [place] photorefractive keratectomy; LASIK laser assisted in situ keratomileusis; HTN hypertension; DM diabetes mellitus; COPD chronic obstructive pulmonary disease

## 2020-01-17 ENCOUNTER — Other Ambulatory Visit: Payer: Self-pay

## 2020-01-17 ENCOUNTER — Encounter (INDEPENDENT_AMBULATORY_CARE_PROVIDER_SITE_OTHER): Payer: Self-pay | Admitting: Ophthalmology

## 2020-01-17 ENCOUNTER — Ambulatory Visit (INDEPENDENT_AMBULATORY_CARE_PROVIDER_SITE_OTHER): Payer: Medicare Other | Admitting: Ophthalmology

## 2020-01-17 DIAGNOSIS — H3581 Retinal edema: Secondary | ICD-10-CM

## 2020-01-17 DIAGNOSIS — H43811 Vitreous degeneration, right eye: Secondary | ICD-10-CM

## 2020-01-17 DIAGNOSIS — H35352 Cystoid macular degeneration, left eye: Secondary | ICD-10-CM

## 2020-01-17 DIAGNOSIS — I10 Essential (primary) hypertension: Secondary | ICD-10-CM

## 2020-01-17 DIAGNOSIS — H401130 Primary open-angle glaucoma, bilateral, stage unspecified: Secondary | ICD-10-CM

## 2020-01-17 DIAGNOSIS — Z961 Presence of intraocular lens: Secondary | ICD-10-CM

## 2020-01-17 DIAGNOSIS — Z8669 Personal history of other diseases of the nervous system and sense organs: Secondary | ICD-10-CM

## 2020-01-17 DIAGNOSIS — H35033 Hypertensive retinopathy, bilateral: Secondary | ICD-10-CM

## 2020-01-17 MED ORDER — PROLENSA 0.07 % OP SOLN: 1.0000 [drp] | Freq: Four times a day (QID) | OPHTHALMIC | 3 refills | Status: DC

## 2020-01-18 ENCOUNTER — Encounter (INDEPENDENT_AMBULATORY_CARE_PROVIDER_SITE_OTHER): Payer: Self-pay | Admitting: Ophthalmology

## 2020-01-30 ENCOUNTER — Other Ambulatory Visit: Payer: Self-pay | Admitting: Family Medicine

## 2020-02-10 NOTE — Progress Notes (Addendum)
[facility] Note  02/14/2020     CHIEF COMPLAINT Patient presents for Retina Follow Up   HISTORY OF PRESENT ILLNESS: Garrett Green is a 74 y.o. male who presents to the clinic today for:   HPI    Retina Follow Up    Patient presents with  Other.  In left eye.  I, the attending physician,  performed the HPI with the patient and updated documentation appropriately.          Comments    4 week follow up CME OS- Eyes are "about the same"  Timolol qam OS, Alphagan P BID OS (pt states Dr. Coralyn Pear told him to stop Latanoprost).  Prednisolone and Prolensa QID OS.  **Pt is not using any eye drops OD**        Last edited by Bernarda Caffey, MD on 02/14/2020 10:01 AM. (History)    pt states he feels like his vision has improved, he states he is still using PF and Prolensa QID  Referring physician: Birdie Sons, MD [address] [place],  [place] [address]  HISTORICAL INFORMATION:   Selected notes from the MEDICAL RECORD NUMBER Referred by Dr. Truman Hayward:  Ocular Hx- PMH-    CURRENT MEDICATIONS: Current Outpatient Medications (Ophthalmic Drugs)  Medication Sig  . brimonidine (ALPHAGAN P) 0.1 % SOLN Place 1 drop into the left eye 2 (two) times daily.  . Bromfenac Sodium (PROLENSA) 0.07 % SOLN Place 1 drop into the left eye 4 (four) times daily.  . prednisoLONE acetate (PRED FORTE) 1 % ophthalmic suspension Place 1 drop into the left eye 4 (four) times daily.  . timolol (TIMOPTIC) 0.5 % ophthalmic solution INSTILL ONE DROP TO THE AFFECTED EYE TWICE DAILY  . latanoprost (XALATAN) 0.005 % ophthalmic solution Place 1 drop into both eyes at bedtime. (Patient not taking: Reported on 02/14/2020)   No current facility-administered medications for this visit. (Ophthalmic Drugs)   Current Outpatient Medications (Other)  Medication Sig  . aspirin 325 MG tablet Take 325 mg by mouth daily.  Marland Kitchen atorvastatin (LIPITOR) 40 MG tablet Take 1 tablet (40 mg  total) by mouth daily at 6 PM.  . clopidogrel (PLAVIX) 75 MG tablet Take 1 tablet (75 mg total) by mouth daily.  . fluticasone (FLONASE) 50 MCG/ACT nasal spray Place 2 sprays into both nostrils daily as needed for allergies.  Marland Kitchen HYDROcodone-acetaminophen (NORCO/VICODIN) 5-325 MG tablet Take 1 tablet by mouth every 6 (six) hours as needed.  . loratadine (CLARITIN) 10 MG tablet Take 10 mg by mouth daily as needed for allergies.  Marland Kitchen LORazepam (ATIVAN) 1 MG tablet Take 1 tablet (1 mg total) by mouth 3 (three) times daily as needed. (Patient taking differently: Take 1 mg by mouth as directed. Only for flying)  . ramipril (ALTACE) 10 MG capsule TAKE 1 CAPSULE BY MOUTH  DAILY  . zolpidem (AMBIEN) 10 MG tablet Take 0.5-1 tablets (5-10 mg total) by mouth at bedtime as needed for sleep.   No current facility-administered medications for this visit. (Other)      REVIEW OF SYSTEMS: ROS    Positive for: Gastrointestinal, Neurological, Musculoskeletal, Cardiovascular, Eyes   Negative for: Constitutional, Skin, Genitourinary, HENT, Endocrine, Respiratory, Psychiatric, Allergic/Imm, Heme/Lymph   Last edited by Leonie Douglas, COA on 02/14/2020  9:17 AM. (History)       ALLERGIES Allergies  Allergen Reactions  . Sulfa Antibiotics Other (See Comments)    Lethargic and hypotension    PAST MEDICAL  HISTORY Past Medical History:  Diagnosis Date  . COVID-19 07/2019  . History of blood transfusion 2014   "related to back OR"  . History of chicken pox   . Stroke ([facility])   . TIA (transient ischemic attack) 10/17/2017; 10/19/2017   Past Surgical History:  Procedure Laterality Date  . BACK SURGERY    . CATARACT EXTRACTION Right 05/2019   Dr. Talbert Forest  . CATARACT EXTRACTION Left 2017  . CATARACT EXTRACTION W/ INTRAOCULAR LENS IMPLANT Left   . COLONOSCOPY WITH PROPOFOL N/A 03/16/2016   Procedure: COLONOSCOPY WITH PROPOFOL;  Surgeon: Manya Silvas, MD;  Location: [facility] ENDOSCOPY;  Service: Endoscopy;   Laterality: N/A;  . ESOPHAGOGASTRODUODENOSCOPY (EGD) WITH ESOPHAGEAL DILATION  1990s  . EYE SURGERY Bilateral    Cat Sx  . IR ANGIO INTRA EXTRACRAN SEL COM CAROTID INNOMINATE BILAT MOD SED  10/31/2017  . IR ANGIO INTRA EXTRACRAN SEL COM CAROTID INNOMINATE BILAT MOD SED  01/15/2019  . IR ANGIO INTRA EXTRACRAN SEL COM CAROTID INNOMINATE BILAT MOD SED  09/27/2019  . IR ANGIO VERTEBRAL SEL VERTEBRAL BILAT MOD SED  10/31/2017  . IR ANGIO VERTEBRAL SEL VERTEBRAL BILAT MOD SED  01/15/2019  . IR ANGIO VERTEBRAL SEL VERTEBRAL BILAT MOD SED  09/27/2019  . IR US GUIDE VASC ACCESS RIGHT  01/15/2019  . IR US GUIDE VASC ACCESS RIGHT  09/27/2019  . LUMBAR LAMINECTOMY/DECOMPRESSION MICRODISCECTOMY Left 07/26/2012   Procedure: LUMBAR LAMINECTOMY/DECOMPRESSION MICRODISCECTOMY 1 LEVEL;  Surgeon: Eustace Moore, MD;  Location: [facility] NEURO ORS;  Service: Neurosurgery;  Laterality: Left;  lumbar five sacral one  . SHOULDER ARTHROSCOPY WITH OPEN ROTATOR CUFF REPAIR Left 1990s  . TONSILLECTOMY      FAMILY HISTORY Family History  Problem Relation Age of Onset  . Heart disease Mother   . Cancer Sister   . Dementia Brother   . Dementia Sister   . Diabetes Brother   . Diabetes Sister   . Colon cancer Neg Hx   . Prostate cancer Neg Hx     SOCIAL HISTORY Social History   Tobacco Use  . Smoking status: Former Smoker    Packs/day: 1.00    Years: 25.00    Pack years: 25.00    Quit date: 03/03/1988    Years since quitting: 31.9  . Smokeless tobacco: Never Used  Vaping Use  . Vaping Use: Never used  Substance Use Topics  . Alcohol use: Yes    Alcohol/week: 1.0 standard drink    Types: 1 Standard drinks or equivalent per week  . Drug use: Never         OPHTHALMIC EXAM:  Base Eye Exam    Visual Acuity (Snellen - Linear)      Right Left   Dist [place] 20/25 20/70   Dist ph [place] NI 20/70 +2       Tonometry (Tonopen, 9:23 AM)      Right Left   Pressure 19 15       Pupils      Dark Light Shape React APD    Right 3 2 Round Brisk None   Left 2 1 Round Brisk None       Visual Fields (Counting fingers)      Left Right    Full Full       Extraocular Movement      Right Left    Full Full       Neuro/Psych    Oriented x3: Yes   Mood/Affect: Normal  Dilation    Both eyes: 1.0% Mydriacyl, 2.5% Phenylephrine @ 9:23 AM        Slit Lamp and Fundus Exam    Slit Lamp Exam      Right Left   Lids/Lashes Dermatochalasis - upper lid Dermatochalasis - upper lid   Conjunctiva/Sclera Mild nasal Pinguecula 1+ Injection, prominent limbal vessels nasally   Cornea Mild arcus, well healed temporal cataract wounds, 2-3+ inferior Punctate epithelial erosions, trace Debris in tear film Mild arcus, well healed temporal cataract wounds, 2-3+ Punctate epithelial erosions, trace Debris in tear film   Anterior Chamber Deep and quiet Deep, 0.5+pigment   Iris Round and dilated mild iridodonesis, Round and poorly dilated to 3.0   Lens PC IOL in good position PC IOL with fine pigment depositon, open PC   Vitreous Vitreous syneresis, Posterior vitreous detachment, vitreous condensations Vitreous syneresis       Fundus Exam      Right Left   Disc Pink and Sharp Pink and Sharp   C/D Ratio 0.5 0.6   Macula Flat, Blunted foveal reflex, mild RPE mottling, No heme or edema Blunted foveal reflex, central CME - improved, +ERM, no heme   Vessels mild attenuation, mild tortuousity, mild AV crossing changes mild attenuation, mild tortuousity, mild AV crossing changes   Periphery Attached    limited view due to poor dilation, attached, no heme, inferior paving stone degeneration / CR atrophy          IMAGING AND PROCEDURES  Imaging and Procedures for 02/14/2020  OCT, Retina - OU - Both Eyes       Right Eye Quality was good. Central Foveal Thickness: 315. Progression has been stable. Findings include normal foveal contour, no IRF, no SRF.   Left Eye Quality was good. Central Foveal Thickness: 393.  Progression has improved. Findings include abnormal foveal contour, intraretinal fluid, epiretinal membrane, no SRF (Interval improvement in IRF/SRF and foveal contour).   Notes *Images captured and stored on drive  Diagnosis / Impression:  OD: NFP, no IRF/SRF OS: CME w/ Interval improvement in IRF/SRF and foveal contour; +ERM  Clinical management:  See below  Abbreviations: NFP - Normal foveal profile. CME - cystoid macular edema. PED - pigment epithelial detachment. IRF - intraretinal fluid. SRF - subretinal fluid. EZ - ellipsoid zone. ERM - epiretinal membrane. ORA - outer retinal atrophy. ORT - outer retinal tubulation. SRHM - subretinal hyper-reflective material. IRHM - intraretinal hyper-reflective material                 ASSESSMENT/PLAN:    ICD-10-CM   1. Cystoid macular edema of left eye  H35.352   2. Retinal edema  H35.81 OCT, Retina - OU - Both Eyes  3. History of retinal detachment  Z86.69   4. Posterior vitreous detachment of right eye  H43.811   5. Essential hypertension  I10   6. Hypertensive retinopathy of both eyes  H35.033   7. Pseudophakia, both eyes  Z96.1   8. Primary open angle glaucoma of both eyes, unspecified glaucoma stage  H40.1130   9. Dry eyes  H04.123     1,2. CME OS  - pt with history of complicated CEIOL OS w/ RD in 2016  - history of CME since 2016 previously managed by Dr. Armanda Heritage   - was receiving intravitreal injections per pt report -- last visit w/ Dr. Posey Pronto in January 2021  - started on PF and Prolensa QID OS on 9.2.21  - BCVA OS improved to  20/70 today from 20/80   - OCT shows interval improvement in IRF/SRF and foveal profile OS  - history of glaucoma and currently on timolol qdaily OU, Alphagan P BID OU, and Latanoprost qhs OU  - recommended discontinuation of latanoprost -- possibly contributing to CME  - continue PF and Prolensa QID OS  - discussed possibility of STK OS -- pt wishes to continue to treat topically for now --  reasonable  - f/u 4-5 weeks, DFE, OCT  3. History of RD OS  - 2016 after cataract surgery Manuella Ghazi)  - s/p PPV w/ Dr. Posey Pronto in 2016  - retina attached; appears stable  - monitor  4. PVD / vitreous syneresis OD  - symptomatic floaters OD -- worsening per pt report  - Discussed findings and prognosis  - No RT or RD on 360 peripheral exam  - Reviewed s/s of RT/RD  - Strict return precautions for any such RT/RD signs/symptoms  - f/u in 4-5 wks -- DFE/OCT  5,6. Hypertensive retinopathy OU - discussed importance of tight BP control - monitor  7. Pseudophakia OU  - s/p CE/IOL (OD: Dr. Talbert Forest, OS: Dr. Ruthell Rummage, 2016)  - s/p YAG OS -- [place], May 2021  - OS with iridonesis and poor dilation - monitor  8. POAG OU  - IOP 20,17 today  - currently on timolol qdaily OU, Alphagan P BID OU, and Latanoprost qhs OU  - recommend stopping latan OU as above  - pt reports irritation w/ timolol  9. Dry eyes OU - recommend artificial tears and lubricating ointment as needed    Ophthalmic Meds Ordered this visit:  Meds ordered this encounter  Medications  . prednisoLONE acetate (PRED FORTE) 1 % ophthalmic suspension    Sig: Place 1 drop into the left eye 4 (four) times daily.    Dispense:  15 mL    Refill:  0       Return for 4-6 wks -- CME OS -- Dilated Exam, OCT.  There are no Patient Instructions on file for this visit.   Explained the diagnoses, plan, and follow up with the patient and they expressed understanding.  Patient expressed understanding of the importance of proper follow up care.   This document serves as a record of services personally performed by Gardiner Sleeper, MD, PhD. It was created on their behalf by San Jetty. Owens Shark, OA an ophthalmic technician. The creation of this record is the provider's dictation and/or activities during the visit.    Electronically signed by: San Jetty. [place], [place] 11.01.2021 1:23 PM   Gardiner Sleeper, M.D., Ph.D. Diseases & Surgery of the  Retina and Vitreous [facility]  I have reviewed the above documentation for accuracy and completeness, and I agree with the above. Gardiner Sleeper, M.D., Ph.D. 02/17/20 1:23 PM    Abbreviations: M myopia (nearsighted); A astigmatism; H hyperopia (farsighted); P presbyopia; Mrx spectacle prescription;  CTL contact lenses; OD right eye; OS left eye; OU both eyes  XT exotropia; ET esotropia; PEK punctate epithelial keratitis; PEE punctate epithelial erosions; DES dry eye syndrome; MGD meibomian gland dysfunction; ATs artificial tears; PFAT's preservative free artificial tears; [place] nuclear sclerotic cataract; PSC posterior subcapsular cataract; ERM epi-retinal membrane; PVD posterior vitreous detachment; RD retinal detachment; DM diabetes mellitus; DR diabetic retinopathy; NPDR non-proliferative diabetic retinopathy; PDR proliferative diabetic retinopathy; CSME clinically significant macular edema; DME diabetic macular edema; dbh dot blot hemorrhages; CWS cotton wool spot; POAG primary open angle glaucoma;  C/D cup-to-disc ratio; HVF humphrey visual field; GVF goldmann visual field; OCT optical coherence tomography; IOP intraocular pressure; BRVO Branch retinal vein occlusion; CRVO central retinal vein occlusion; CRAO central retinal artery occlusion; BRAO branch retinal artery occlusion; RT retinal tear; SB scleral buckle; PPV pars plana vitrectomy; VH Vitreous hemorrhage; PRP panretinal laser photocoagulation; IVK intravitreal kenalog; VMT vitreomacular traction; MH Macular hole;  NVD neovascularization of the disc; NVE neovascularization elsewhere; AREDS age related eye disease study; ARMD age related macular degeneration; POAG primary open angle glaucoma; EBMD epithelial/anterior basement membrane dystrophy; ACIOL anterior chamber intraocular lens; IOL intraocular lens; PCIOL posterior chamber intraocular lens; Phaco/IOL phacoemulsification with intraocular lens placement; [place]  photorefractive keratectomy; LASIK laser assisted in situ keratomileusis; HTN hypertension; DM diabetes mellitus; COPD chronic obstructive pulmonary disease

## 2020-02-14 ENCOUNTER — Other Ambulatory Visit: Payer: Self-pay

## 2020-02-14 ENCOUNTER — Ambulatory Visit (INDEPENDENT_AMBULATORY_CARE_PROVIDER_SITE_OTHER): Payer: Medicare Other | Admitting: Ophthalmology

## 2020-02-14 ENCOUNTER — Encounter (INDEPENDENT_AMBULATORY_CARE_PROVIDER_SITE_OTHER): Payer: Self-pay | Admitting: Ophthalmology

## 2020-02-14 DIAGNOSIS — H43811 Vitreous degeneration, right eye: Secondary | ICD-10-CM

## 2020-02-14 DIAGNOSIS — H35352 Cystoid macular degeneration, left eye: Secondary | ICD-10-CM | POA: Diagnosis not present

## 2020-02-14 DIAGNOSIS — H3581 Retinal edema: Secondary | ICD-10-CM | POA: Diagnosis not present

## 2020-02-14 DIAGNOSIS — Z8669 Personal history of other diseases of the nervous system and sense organs: Secondary | ICD-10-CM

## 2020-02-14 DIAGNOSIS — H35033 Hypertensive retinopathy, bilateral: Secondary | ICD-10-CM

## 2020-02-14 DIAGNOSIS — Z961 Presence of intraocular lens: Secondary | ICD-10-CM

## 2020-02-14 DIAGNOSIS — I10 Essential (primary) hypertension: Secondary | ICD-10-CM

## 2020-02-14 DIAGNOSIS — H04123 Dry eye syndrome of bilateral lacrimal glands: Secondary | ICD-10-CM

## 2020-02-14 DIAGNOSIS — H401130 Primary open-angle glaucoma, bilateral, stage unspecified: Secondary | ICD-10-CM

## 2020-02-14 MED ORDER — PREDNISOLONE ACETATE 1 % OP SUSP
1.0000 [drp] | Freq: Four times a day (QID) | OPHTHALMIC | 0 refills | Status: DC
Start: 2020-02-14 — End: 2020-03-24

## 2020-02-15 ENCOUNTER — Other Ambulatory Visit: Payer: Self-pay | Admitting: Family Medicine

## 2020-02-15 NOTE — Telephone Encounter (Signed)
Requested medications are due for refill today yes  Requested medications are on the active medication list yes  Last refill 8/9  Last visit Do not see med/dx addressed  Future visit scheduled no  Notes to clinic Failed protocol of valid visit within 6 months.

## 2020-02-25 ENCOUNTER — Other Ambulatory Visit: Payer: Self-pay | Admitting: Family Medicine

## 2020-02-25 DIAGNOSIS — M47816 Spondylosis without myelopathy or radiculopathy, lumbar region: Secondary | ICD-10-CM

## 2020-02-25 NOTE — Telephone Encounter (Signed)
Requested medication (s) are due for refill today: no  Requested medication (s) are on the active medication list: yes  Last refill:  10/30/2019  Future visit scheduled:no  This refill cannot be delegated    Requested Prescriptions  Pending Prescriptions Disp Refills   HYDROcodone-acetaminophen (NORCO/VICODIN) 5-325 MG tablet 120 tablet 0    Sig: Take 1 tablet by mouth every 6 (six) hours as needed.      Not Delegated - Analgesics:  Opioid Agonist Combinations Failed - 02/25/2020  8:45 AM      Failed - This refill cannot be delegated      Failed - Urine Drug Screen completed in last 360 days      Failed - Valid encounter within last 6 months    Recent Outpatient Visits           2 months ago Prostate cancer screening   [facility] Birdie Sons, MD   7 months ago Essential (primary) hypertension   [facility] Birdie Sons, MD   1 year ago Encounter for [employer] annual wellness exam   [facility] Birdie Sons, MD   2 years ago Encounter for [employer] annual wellness exam   [facility] Birdie Sons, MD   2 years ago Right thalamic infarction [facility])   [facility] Caryn Section, Kirstie Peri, MD

## 2020-02-25 NOTE — Telephone Encounter (Signed)
Patient requesting HYDROcodone-acetaminophen (NORCO/VICODIN) 5-325 MG tablet , informed please allow 48 to 72 hour turn around time   [id number] - [place], [place] Phone:  [phone]  Fax:  [phone]

## 2020-02-26 ENCOUNTER — Telehealth: Payer: Self-pay | Admitting: Family Medicine

## 2020-02-26 MED ORDER — HYDROCODONE-ACETAMINOPHEN 5-325 MG PO TABS: 1.0000 | ORAL_TABLET | Freq: Four times a day (QID) | ORAL | 0 refills | Status: DC | PRN

## 2020-02-26 NOTE — Telephone Encounter (Signed)
I haven't seen a PA

## 2020-02-26 NOTE — Telephone Encounter (Signed)
Pharmacy called to get clarification on patient's medication for HYDROcodone-acetaminophen (NORCO/VICODIN) 5-325 MG tablet.  They stated that the insurance will not cover the full script without doctor authorization.  Please call to discuss at [phone] [facility])

## 2020-02-26 NOTE — Telephone Encounter (Signed)
Dr. Caryn Section, have you seen a PA for this? Please advise. Thanks!

## 2020-03-19 NOTE — Progress Notes (Signed)
[facility] Note  03/24/2020     CHIEF COMPLAINT Patient presents for Retina Follow Up   HISTORY OF PRESENT ILLNESS: Garrett Green is a 74 y.o. male who presents to the clinic today for:  HPI    Retina Follow Up    Patient presents with  Other.  In left eye.  This started 5 weeks ago.  I, the attending physician,  performed the HPI with the patient and updated documentation appropriately.          Comments    Patient states vision doing pretty good. OS is a lot better than it was. OD about the same. A lot of floaters. No eye pain. Using drops.       Last edited by Bernarda Caffey, MD on 03/24/2020  9:55 AM. (History)    Pt states left eye is a "whole lot better than it was"  Referring physician: Birdie Sons, MD [address] [place],  [place] [address]  HISTORICAL INFORMATION:   Selected notes from the MEDICAL RECORD NUMBER    CURRENT MEDICATIONS: Current Outpatient Medications (Ophthalmic Drugs)  Medication Sig  . brimonidine (ALPHAGAN P) 0.1 % SOLN Place 1 drop into the left eye 2 (two) times daily.  . Bromfenac Sodium (PROLENSA) 0.07 % SOLN Place 1 drop into the left eye 4 (four) times daily.  Marland Kitchen latanoprost (XALATAN) 0.005 % ophthalmic solution Place 1 drop into both eyes at bedtime. (Patient not taking: Reported on 02/14/2020)  . prednisoLONE acetate (PRED FORTE) 1 % ophthalmic suspension Place 1 drop into the left eye 4 (four) times daily.  . timolol (TIMOPTIC) 0.5 % ophthalmic solution INSTILL ONE DROP TO THE AFFECTED EYE TWICE DAILY   No current facility-administered medications for this visit. (Ophthalmic Drugs)   Current Outpatient Medications (Other)  Medication Sig  . aspirin 325 MG tablet Take 325 mg by mouth daily.  Marland Kitchen atorvastatin (LIPITOR) 40 MG tablet Take 1 tablet (40 mg total) by mouth daily at 6 PM.  . clopidogrel (PLAVIX) 75 MG tablet TAKE 1 TABLET BY MOUTH EVERY DAY  . fluticasone (FLONASE) 50 MCG/ACT  nasal spray Place 2 sprays into both nostrils daily as needed for allergies.  Marland Kitchen HYDROcodone-acetaminophen (NORCO/VICODIN) 5-325 MG tablet Take 1 tablet by mouth every 6 (six) hours as needed.  . loratadine (CLARITIN) 10 MG tablet Take 10 mg by mouth daily as needed for allergies.  Marland Kitchen LORazepam (ATIVAN) 1 MG tablet Take 1 tablet (1 mg total) by mouth 3 (three) times daily as needed. (Patient taking differently: Take 1 mg by mouth as directed. Only for flying)  . ramipril (ALTACE) 10 MG capsule TAKE 1 CAPSULE BY MOUTH  DAILY  . zolpidem (AMBIEN) 10 MG tablet Take 0.5-1 tablets (5-10 mg total) by mouth at bedtime as needed for sleep.   No current facility-administered medications for this visit. (Other)      REVIEW OF SYSTEMS: ROS    Positive for: Gastrointestinal, Neurological, Musculoskeletal, Cardiovascular, Eyes   Negative for: Constitutional, Skin, Genitourinary, HENT, Endocrine, Respiratory, Psychiatric, Allergic/Imm, Heme/Lymph   Last edited by Theodore Demark, COA on 03/24/2020  9:06 AM. (History)       ALLERGIES Allergies  Allergen Reactions  . Sulfa Antibiotics Other (See Comments)    Lethargic and hypotension    PAST MEDICAL HISTORY Past Medical History:  Diagnosis Date  . COVID-19 07/2019  . History of blood transfusion 2014   "related to back OR"  . History  of chicken pox   . Stroke ([place])   . TIA (transient ischemic attack) 10/17/2017; 10/19/2017   Past Surgical History:  Procedure Laterality Date  . BACK SURGERY    . CATARACT EXTRACTION Right 05/2019   Dr. Talbert Forest  . CATARACT EXTRACTION Left 2017  . CATARACT EXTRACTION W/ INTRAOCULAR LENS IMPLANT Left   . COLONOSCOPY WITH PROPOFOL N/A 03/16/2016   Procedure: COLONOSCOPY WITH PROPOFOL;  Surgeon: Manya Silvas, MD;  Location: [facility];  Service: Endoscopy;  Laterality: N/A;  . ESOPHAGOGASTRODUODENOSCOPY (EGD) WITH ESOPHAGEAL DILATION  1990s  . EYE SURGERY Bilateral    Cat Sx  . IR ANGIO INTRA EXTRACRAN  SEL COM CAROTID INNOMINATE BILAT MOD SED  10/31/2017  . IR ANGIO INTRA EXTRACRAN SEL COM CAROTID INNOMINATE BILAT MOD SED  01/15/2019  . IR ANGIO INTRA EXTRACRAN SEL COM CAROTID INNOMINATE BILAT MOD SED  09/27/2019  . IR ANGIO VERTEBRAL SEL VERTEBRAL BILAT MOD SED  10/31/2017  . IR ANGIO VERTEBRAL SEL VERTEBRAL BILAT MOD SED  01/15/2019  . IR ANGIO VERTEBRAL SEL VERTEBRAL BILAT MOD SED  09/27/2019  . IR US GUIDE VASC ACCESS RIGHT  01/15/2019  . IR US GUIDE VASC ACCESS RIGHT  09/27/2019  . LUMBAR LAMINECTOMY/DECOMPRESSION MICRODISCECTOMY Left 07/26/2012   Procedure: LUMBAR LAMINECTOMY/DECOMPRESSION MICRODISCECTOMY 1 LEVEL;  Surgeon: Eustace Moore, MD;  Location: [facility];  Service: Neurosurgery;  Laterality: Left;  lumbar five sacral one  . SHOULDER ARTHROSCOPY WITH OPEN ROTATOR CUFF REPAIR Left 1990s  . TONSILLECTOMY      FAMILY HISTORY Family History  Problem Relation Age of Onset  . Heart disease Mother   . Cancer Sister   . Dementia Brother   . Dementia Sister   . Diabetes Brother   . Diabetes Sister   . Colon cancer Neg Hx   . Prostate cancer Neg Hx     SOCIAL HISTORY Social History   Tobacco Use  . Smoking status: Former Smoker    Packs/day: 1.00    Years: 25.00    Pack years: 25.00    Quit date: 03/03/1988    Years since quitting: 32.0  . Smokeless tobacco: Never Used  Vaping Use  . Vaping Use: Never used  Substance Use Topics  . Alcohol use: Yes    Alcohol/week: 1.0 standard drink    Types: 1 Standard drinks or equivalent per week  . Drug use: Never         OPHTHALMIC EXAM:  Base Eye Exam    Visual Acuity (Snellen - Linear)      Right Left   Dist [place] 20/20 20/70 -2   Dist ph [place]  20/50 -2       Tonometry (Tonopen, 9:04 AM)      Right Left   Pressure 17 17       Pupils      Dark Light Shape React APD   Right 3 2 Round Brisk None   Left 2 1 Round Brisk None       Visual Fields (Counting fingers)      Left Right    Full Full       Extraocular  Movement      Right Left    Full Full       Neuro/Psych    Oriented x3: Yes   Mood/Affect: Normal       Dilation    Both eyes: 1.0% Mydriacyl, 2.5% Phenylephrine @ 9:04 AM        Slit Lamp and  Fundus Exam    Slit Lamp Exam      Right Left   Lids/Lashes Dermatochalasis - upper lid Dermatochalasis - upper lid   Conjunctiva/Sclera Mild nasal Pinguecula 1+ Injection, prominent limbal vessels nasally   Cornea Mild arcus, well healed temporal cataract wounds, 1-2+ inferior Punctate epithelial erosions, trace Debris in tear film Mild arcus, well healed temporal cataract wounds, 2-3+ Punctate epithelial erosions, trace Debris in tear film   Anterior Chamber Deep and quiet Deep, 0.5+pigment   Iris Round and dilated mild iridodonesis, Round and poorly dilated to 3.0   Lens PC IOL in good position PC IOL with fine pigment depositon, open PC   Vitreous Vitreous syneresis, Posterior vitreous detachment, vitreous condensations Vitreous syneresis       Fundus Exam      Right Left   Disc Pink and Sharp Pink and Sharp, +PPA   C/D Ratio 0.5 0.6   Macula Flat, Blunted foveal reflex, mild RPE mottling and clumping, No heme or edema Blunted foveal reflex, central CME - improved, +ERM, RPE mottling and clumping no heme   Vessels Attenuated, tortuous, mild AV crossing changes, mild Copper wiring Attenuated, tortuous, mild AV crossing changes, mild Copper wiring   Periphery Attached    limited view due to poor dilation, attached, no heme, inferior paving stone degeneration / CR atrophy          IMAGING AND PROCEDURES  Imaging and Procedures for 03/24/2020  OCT, Retina - OU - Both Eyes       Right Eye Quality was good. Central Foveal Thickness: 315. Progression has been stable. Findings include normal foveal contour, no IRF, no SRF.   Left Eye Quality was good. Central Foveal Thickness: 388. Progression has improved. Findings include abnormal foveal contour, intraretinal fluid, epiretinal  membrane, no SRF (Minimal improvement in central IRF).   Notes *Images captured and stored on drive  Diagnosis / Impression:  OD: NFP, no IRF/SRF OS: CME w/ Minimal improvement in central IRF; +ERM  Clinical management:  See below  Abbreviations: NFP - Normal foveal profile. CME - cystoid macular edema. PED - pigment epithelial detachment. IRF - intraretinal fluid. SRF - subretinal fluid. EZ - ellipsoid zone. ERM - epiretinal membrane. ORA - outer retinal atrophy. ORT - outer retinal tubulation. SRHM - subretinal hyper-reflective material. IRHM - intraretinal hyper-reflective material                 ASSESSMENT/PLAN:    ICD-10-CM   1. Cystoid macular edema of left eye  H35.352   2. Retinal edema  H35.81 OCT, Retina - OU - Both Eyes  3. History of retinal detachment  Z86.69   4. Posterior vitreous detachment of right eye  H43.811   5. Essential hypertension  I10   6. Hypertensive retinopathy of both eyes  H35.033   7. Pseudophakia, both eyes  Z96.1   8. Primary open angle glaucoma of both eyes, unspecified glaucoma stage  H40.1130   9. Dry eyes  H04.123     1,2. CME OS  - pt with history of complicated CEIOL OS w/ RD in 2016  - history of CME since 2016 previously managed by Dr. Armanda Heritage   - was receiving intravitreal injections per pt report -- last visit w/ Dr. Posey Pronto in January 2021  - started on PF and Prolensa QID OS on 9.2.21  - BCVA OS improved to 20/50 from 20/70 today  - OCT shows mild improvement in IRF/SRF and foveal profile OS  -  history of glaucoma and currently on timolol qdaily OU, Alphagan P BID OU, and Latanoprost qhs OU  - recommended discontinuation of latanoprost -- possibly contributing to CME  - continue PF and Prolensa QID OS  - discussed possibility of STK OS -- pt wishes to continue to treat topically for now -- reasonable  - f/u 6 weeks, DFE, OCT  3. History of RD OS  - 2016 after cataract surgery Manuella Ghazi)  - s/p PPV w/ Dr. Posey Pronto in 2016  -  retina attached; appears stable  - monitor  4. PVD / vitreous syneresis OD  - symptomatic floaters OD -- worsening per pt report  - Discussed findings and prognosis  - No RT or RD on 360 peripheral exam  - Reviewed s/s of RT/RD  - Strict return precautions for any such RT/RD signs/symptoms  - f/u in 4-5 wks -- DFE/OCT  5,6. Hypertensive retinopathy OU - discussed importance of tight BP control - monitor  7. Pseudophakia OU  - s/p CE/IOL (OD: Dr. Talbert Forest, OS: Dr. Ruthell Rummage, 2016)  - s/p YAG OS -- [place], May 2021  - OS with iridonesis and poor dilation - monitor  8. POAG OU  - IOP 17 OU today  - currently on timolol qdaily OU, Alphagan P BID OU, and Latanoprost qhs OU  - recommend stopping latan OU as above  - pt reports irritation w/ timolol  9. Dry eyes OU - recommend artificial tears and lubricating ointment as needed    Ophthalmic Meds Ordered this visit:  Meds ordered this encounter  Medications  . Bromfenac Sodium (PROLENSA) 0.07 % SOLN    Sig: Place 1 drop into the left eye 4 (four) times daily.    Dispense:  6 mL    Refill:  3  . prednisoLONE acetate (PRED FORTE) 1 % ophthalmic suspension    Sig: Place 1 drop into the left eye 4 (four) times daily.    Dispense:  15 mL    Refill:  0       Return in about 6 weeks (around 05/05/2020) for f/u CME OS, DFE, OCT.  There are no Patient Instructions on file for this visit.   Explained the diagnoses, plan, and follow up with the patient and they expressed understanding.  Patient expressed understanding of the importance of proper follow up care.   This document serves as a record of services personally performed by Gardiner Sleeper, MD, PhD. It was created on their behalf by San Jetty. Owens Shark, OA an ophthalmic technician. The creation of this record is the provider's dictation and/or activities during the visit.    Electronically signed by: San Jetty. Owens Shark, [place] 12.14.2021 10:26 AM  Gardiner Sleeper, M.D., Ph.D. Diseases &  Surgery of the Retina and [place] 03/24/2020   I have reviewed the above documentation for accuracy and completeness, and I agree with the above. Gardiner Sleeper, M.D., Ph.D. 03/24/20 10:26 AM   Abbreviations: M myopia (nearsighted); A astigmatism; H hyperopia (farsighted); P presbyopia; Mrx spectacle prescription;  CTL contact lenses; OD right eye; OS left eye; OU both eyes  XT exotropia; ET esotropia; PEK punctate epithelial keratitis; PEE punctate epithelial erosions; DES dry eye syndrome; MGD meibomian gland dysfunction; ATs artificial tears; PFAT's preservative free artificial tears; [place] nuclear sclerotic cataract; PSC posterior subcapsular cataract; ERM epi-retinal membrane; PVD posterior vitreous detachment; RD retinal detachment; DM diabetes mellitus; DR diabetic retinopathy; NPDR non-proliferative diabetic retinopathy; PDR proliferative diabetic retinopathy; CSME clinically  significant macular edema; DME diabetic macular edema; dbh dot blot hemorrhages; CWS cotton wool spot; POAG primary open angle glaucoma; C/D cup-to-disc ratio; HVF humphrey visual field; GVF goldmann visual field; OCT optical coherence tomography; IOP intraocular pressure; BRVO Branch retinal vein occlusion; CRVO central retinal vein occlusion; CRAO central retinal artery occlusion; BRAO branch retinal artery occlusion; RT retinal tear; SB scleral buckle; PPV pars plana vitrectomy; VH Vitreous hemorrhage; PRP panretinal laser photocoagulation; IVK intravitreal kenalog; VMT vitreomacular traction; MH Macular hole;  NVD neovascularization of the disc; NVE neovascularization elsewhere; AREDS age related eye disease study; ARMD age related macular degeneration; POAG primary open angle glaucoma; EBMD epithelial/anterior basement membrane dystrophy; ACIOL anterior chamber intraocular lens; IOL intraocular lens; PCIOL posterior chamber intraocular lens; Phaco/IOL phacoemulsification with intraocular  lens placement; [place] photorefractive keratectomy; LASIK laser assisted in situ keratomileusis; HTN hypertension; DM diabetes mellitus; COPD chronic obstructive pulmonary disease

## 2020-03-24 ENCOUNTER — Other Ambulatory Visit: Payer: Self-pay

## 2020-03-24 ENCOUNTER — Ambulatory Visit (INDEPENDENT_AMBULATORY_CARE_PROVIDER_SITE_OTHER): Payer: Medicare Other | Admitting: Ophthalmology

## 2020-03-24 ENCOUNTER — Encounter (INDEPENDENT_AMBULATORY_CARE_PROVIDER_SITE_OTHER): Payer: Self-pay | Admitting: Ophthalmology

## 2020-03-24 DIAGNOSIS — H04123 Dry eye syndrome of bilateral lacrimal glands: Secondary | ICD-10-CM

## 2020-03-24 DIAGNOSIS — H3581 Retinal edema: Secondary | ICD-10-CM | POA: Diagnosis not present

## 2020-03-24 DIAGNOSIS — I10 Essential (primary) hypertension: Secondary | ICD-10-CM

## 2020-03-24 DIAGNOSIS — Z8669 Personal history of other diseases of the nervous system and sense organs: Secondary | ICD-10-CM | POA: Diagnosis not present

## 2020-03-24 DIAGNOSIS — Z961 Presence of intraocular lens: Secondary | ICD-10-CM

## 2020-03-24 DIAGNOSIS — H43811 Vitreous degeneration, right eye: Secondary | ICD-10-CM

## 2020-03-24 DIAGNOSIS — H35033 Hypertensive retinopathy, bilateral: Secondary | ICD-10-CM

## 2020-03-24 DIAGNOSIS — H35352 Cystoid macular degeneration, left eye: Secondary | ICD-10-CM | POA: Diagnosis not present

## 2020-03-24 DIAGNOSIS — H401130 Primary open-angle glaucoma, bilateral, stage unspecified: Secondary | ICD-10-CM

## 2020-03-24 MED ORDER — PROLENSA 0.07 % OP SOLN
1.0000 [drp] | Freq: Four times a day (QID) | OPHTHALMIC | 3 refills | Status: DC
Start: 2020-03-24 — End: 2020-05-05

## 2020-03-24 MED ORDER — PREDNISOLONE ACETATE 1 % OP SUSP
1.0000 [drp] | Freq: Four times a day (QID) | OPHTHALMIC | 0 refills | Status: DC
Start: 2020-03-24 — End: 2020-08-10

## 2020-03-25 ENCOUNTER — Ambulatory Visit (INDEPENDENT_AMBULATORY_CARE_PROVIDER_SITE_OTHER): Payer: Medicare Other | Admitting: Dermatology

## 2020-03-25 ENCOUNTER — Encounter: Payer: Self-pay | Admitting: Dermatology

## 2020-03-25 DIAGNOSIS — L814 Other melanin hyperpigmentation: Secondary | ICD-10-CM

## 2020-03-25 DIAGNOSIS — L570 Actinic keratosis: Secondary | ICD-10-CM | POA: Diagnosis not present

## 2020-03-25 DIAGNOSIS — L821 Other seborrheic keratosis: Secondary | ICD-10-CM

## 2020-03-25 DIAGNOSIS — Z1283 Encounter for screening for malignant neoplasm of skin: Secondary | ICD-10-CM

## 2020-03-25 DIAGNOSIS — L578 Other skin changes due to chronic exposure to nonionizing radiation: Secondary | ICD-10-CM

## 2020-03-25 DIAGNOSIS — D229 Melanocytic nevi, unspecified: Secondary | ICD-10-CM

## 2020-03-25 DIAGNOSIS — D1800 Hemangioma unspecified site: Secondary | ICD-10-CM

## 2020-03-25 NOTE — Progress Notes (Signed)
Follow-Up Visit   Subjective  Garrett Green is a 74 y.o. male who presents for the following: UBSE (Patient with history of AK's. Nothing new or changing patient is aware of. ).  Patient accompanied by wife and daughter.   The following portions of the chart were reviewed this encounter and updated as appropriate:   Tobacco  Allergies  Meds  Problems  Med Hx  Surg Hx  Fam Hx      Review of Systems:  No other skin or systemic complaints except as noted in HPI or Assessment and Plan.  Objective  Well appearing patient in no apparent distress; mood and affect are within normal limits.  All skin waist up examined.  Objective  Left Dorsal Hand x 1, occipital scalp x 1, left vertex scalp x 1, left parietal scalp x 1 (4): Erythematous thin papules/macules with gritty scale.    Assessment & Plan  AK (actinic keratosis) (4) Left Dorsal Hand x 1, occipital scalp x 1, left vertex scalp x 1, left parietal scalp x 1  Prior to procedure, discussed risks of blister formation, small wound, skin dyspigmentation, or rare scar following cryotherapy.   Destruction of lesion - Left Dorsal Hand x 1, occipital scalp x 1, left vertex scalp x 1, left parietal scalp x 1 Complexity: simple   Destruction method: cryotherapy   Informed consent: discussed and consent obtained   Lesion destroyed using liquid nitrogen: Yes   Cryotherapy cycles:  2 Outcome: patient tolerated procedure well with no complications   Post-procedure details: wound care instructions given     Lentigines - Scattered tan macules - Discussed due to sun exposure - Benign, observe - Call for any changes  Seborrheic Keratoses - Stuck-on, waxy, tan-brown papules and plaques  - Discussed benign etiology and prognosis. - Observe - Call for any changes  Melanocytic Nevi - Tan-brown and/or pink-flesh-colored symmetric macules and papules - Benign appearing on exam today - Observation - Call clinic for new or  changing moles - Recommend daily use of broad spectrum spf 30+ sunscreen to sun-exposed areas.   Hemangiomas - Red papules - Discussed benign nature - Observe - Call for any changes  Actinic Damage - Severe, chronic, secondary to cumulative UV radiation exposure over time - diffuse scaly erythematous macules and papules with underlying dyspigmentation - Discussed Prescription "Field Treatment" for Severe, Chronic Confluent Actinic Changes with Pre-Cancerous Actinic Keratoses Field treatment involves treatment of an entire area of skin that has confluent Actinic Changes (Sun/ Ultraviolet light damage) and PreCancerous Actinic Keratoses by method of PhotoDynamic Therapy (PDT) and/or prescription Topical Chemotherapy agents such as 5-fluorouracil, 5-fluorouracil/calcipotriene, and/or imiquimod.  The purpose is to decrease the number of clinically evident and subclinical PreCancerous lesions to prevent progression to development of skin cancer by chemically destroying early precancer changes that may or may not be visible.  It has been shown to reduce the risk of developing skin cancer in the treated area. As a result of treatment, redness, scaling, crusting, and open sores may occur during treatment course. One or more than one of these methods may be used and may have to be used several times to control, suppress and eliminate the PreCancerous changes. Discussed treatment course, expected reaction, and possible side effects. - Recommend daily broad spectrum sunscreen SPF 30+ to sun-exposed areas, reapply every 2 hours as needed.  - Call for new or changing lesions. - Start 5-fluorouracil/calcipotriene cream twice a day for 4 days to affected areas including left forehead,  brow and temple. Prescription sent to [facility]. Patient advised they will receive an email to purchase the medication online and have it sent to their home. Patient provided with handout reviewing treatment  course and side effects and advised to call or message [place] on MyChart with any concerns.  Skin cancer screening performed today.  Return in about 2 months (around 05/26/2020) for AK follow up s/p 5FU/calcipotriene, 1 year TBSE.  Graciella Belton, RMA, am acting as scribe for Forest Gleason, MD .  Documentation: I have reviewed the above documentation for accuracy and completeness, and I agree with the above.  Forest Gleason, MD

## 2020-03-25 NOTE — Patient Instructions (Addendum)
Melanoma ABCDEs  Melanoma is the most dangerous type of skin cancer, and is the leading cause of death from skin disease.  You are more likely to develop melanoma if you:  Have light-colored skin, light-colored eyes, or red or blond hair  Spend a lot of time in the sun  Tan regularly, either outdoors or in a tanning bed  Have had blistering sunburns, especially during childhood  Have a close family member who has had a melanoma  Have atypical moles or large birthmarks  Early detection of melanoma is key since treatment is typically straightforward and cure rates are extremely high if we catch it early.   The first sign of melanoma is often a change in a mole or a new dark spot.  The ABCDE system is a way of remembering the signs of melanoma.  A for asymmetry:  The two halves do not match. B for border:  The edges of the growth are irregular. C for color:  A mixture of colors are present instead of an even brown color. D for diameter:  Melanomas are usually (but not always) greater than 76mm - the size of a pencil eraser. E for evolution:  The spot keeps changing in size, shape, and color.  Please check your skin once per month between visits. You can use a small mirror in front and a large mirror behind you to keep an eye on the back side or your body.   If you see any new or changing lesions before your next follow-up, please call to schedule a visit.  Please continue daily skin protection including broad spectrum sunscreen SPF 30+ to sun-exposed areas, reapplying every 2 hours as needed when you're outdoors.   Instructions for [facility] Medications  One or more of your medications was sent to the [facility] mail order [facility] pharmacy. You will receive an email from them and can purchase the medicine through that link. It will then be mailed to your home at the address you confirmed. If for any reason you do not receive an email from them, please check your spam  folder. If you still do not find the email, please let us know. [facility] phone number is [phone].  5-Fluorouracil/Calcipotriene Patient Education   Actinic keratoses are the dry, red scaly spots on the skin caused by sun damage. A portion of these spots can turn into skin cancer with time, and treating them can help prevent development of skin cancer.   Treatment of these spots requires removal of the defective skin cells. There are various ways to remove actinic keratoses, including freezing with liquid nitrogen, treatment with creams, or treatment with a blue light procedure in the office.   5-fluorouracil cream is a topical cream used to treat actinic keratoses. It works by interfering with the growth of abnormal fast-growing skin cells, such as actinic keratoses. These cells peel off and are replaced by healthy ones.   5-fluorouracil/calcipotriene is a combination of the 5-fluorouracil cream with a vitamin D analog cream called calcipotriene. The calcipotriene alone does not treat actinic keratoses. However, when it is combined with 5-fluorouracil, it helps the 5-fluorouracil treat the actinic keratoses much faster so that the same results can be achieved with a much shorter treatment time.  INSTRUCTIONS FOR 5-FLUOROURACIL/CALCIPOTRIENE CREAM:   5-fluorouracil/calcipotriene cream typically only needs to be used for 4 days. A thin layer should be applied twice a day to the treatment areas (left forehead, brow and temple) recommended by your physician.  If your physician prescribed you separate tubes of 5-fluourouracil and calcipotriene, apply a thin layer of 5-fluorouracil followed by a thin layer of calcipotriene.   Avoid contact with your eyes, nostrils, and mouth. Do not use 5-fluorouracil/calcipotriene cream on infected or open wounds.   You will develop redness, irritation and some crusting at areas where you have pre-cancer damage/actinic keratoses. IF YOU DEVELOP PAIN,  BLEEDING, OR SIGNIFICANT CRUSTING, STOP THE TREATMENT EARLY - you have already gotten a good response and the actinic keratoses should clear up well.  Wash your hands after applying 5-fluorouracil 5% cream on your skin.   A moisturizer or sunscreen with a minimum SPF 30 should be applied each morning.   Once you have finished the treatment, you can apply a thin layer of Vaseline twice a day to irritated areas to soothe and calm the areas more quickly. If you experience significant discomfort, contact your physician.  For some patients it is necessary to repeat the treatment for best results.  SIDE EFFECTS: When using 5-fluorouracil/calcipotriene cream, you may have mild irritation, such as redness, dryness, swelling, or a mild burning sensation. This usually resolves within 2 weeks. The more actinic keratoses you have, the more redness and inflammation you can expect during treatment. Eye irritation has been reported rarely. If this occurs, please let us know.  If you have any trouble using this cream, please call the office. If you have any other questions about this information, please do not hesitate to ask me before you leave the office.  Cryotherapy Aftercare  . Wash gently with soap and water everyday.   Marland Kitchen Apply Vaseline and Band-Aid daily until healed.  Prior to procedure, discussed risks of blister formation, small wound, skin dyspigmentation, or rare scar following cryotherapy.

## 2020-04-06 ENCOUNTER — Other Ambulatory Visit (HOSPITAL_COMMUNITY): Payer: Self-pay | Admitting: Interventional Radiology

## 2020-04-06 DIAGNOSIS — I771 Stricture of artery: Secondary | ICD-10-CM

## 2020-04-20 ENCOUNTER — Other Ambulatory Visit: Payer: Self-pay

## 2020-04-20 ENCOUNTER — Ambulatory Visit (HOSPITAL_COMMUNITY): Payer: Medicare Other

## 2020-04-20 ENCOUNTER — Ambulatory Visit (HOSPITAL_COMMUNITY)
Admission: RE | Admit: 2020-04-20 | Discharge: 2020-04-20 | Disposition: A | Discharge status: Home / Self Care | Payer: Medicare Other | Source: Ambulatory Visit | Attending: Interventional Radiology | Admitting: Interventional Radiology

## 2020-04-20 DIAGNOSIS — I771 Stricture of artery: Secondary | ICD-10-CM | POA: Insufficient documentation

## 2020-04-20 DIAGNOSIS — I639 Cerebral infarction, unspecified: Secondary | ICD-10-CM | POA: Insufficient documentation

## 2020-04-21 ENCOUNTER — Telehealth (HOSPITAL_COMMUNITY): Payer: Self-pay

## 2020-04-21 NOTE — Telephone Encounter (Signed)
Called pt regarding recent imaging, no answer, left vm. AW  

## 2020-04-22 ENCOUNTER — Telehealth (HOSPITAL_COMMUNITY): Payer: Self-pay

## 2020-04-22 NOTE — Telephone Encounter (Signed)
Pt agreed to f/u in 6 months with mri/mra. AW

## 2020-04-30 NOTE — Progress Notes (Signed)
[facility] Note  05/05/2020     CHIEF COMPLAINT Patient presents for Retina Follow Up   HISTORY OF PRESENT ILLNESS: Garrett Green is a 75 y.o. male who presents to the clinic today for:  HPI    Retina Follow Up    Patient presents with  Other.  In left eye.  This started 6 weeks ago.  I, the attending physician,  performed the HPI with the patient and updated documentation appropriately.          Comments    Patient here for 6 weeks retina follow up for CME OS. Patient states vision doing pretty good the same. No worse. No eye pain.        Last edited by Bernarda Caffey, MD on 05/05/2020  9:09 AM. (History)    Pt states left eye is "no worse"  Referring physician: Birdie Sons, MD [address] [place],  [place] [address]  HISTORICAL INFORMATION:   Selected notes from the MEDICAL RECORD NUMBER    CURRENT MEDICATIONS: Current Outpatient Medications (Ophthalmic Drugs)  Medication Sig  . brimonidine (ALPHAGAN P) 0.1 % SOLN Place 1 drop into the left eye 2 (two) times daily.  . Bromfenac Sodium (PROLENSA) 0.07 % SOLN Place 1 drop into the left eye 4 (four) times daily.  Marland Kitchen latanoprost (XALATAN) 0.005 % ophthalmic solution Place 1 drop into both eyes at bedtime. (Patient not taking: Reported on 02/14/2020)  . prednisoLONE acetate (PRED FORTE) 1 % ophthalmic suspension Place 1 drop into the left eye 4 (four) times daily.  . timolol (TIMOPTIC) 0.5 % ophthalmic solution INSTILL ONE DROP TO THE AFFECTED EYE TWICE DAILY   No current facility-administered medications for this visit. (Ophthalmic Drugs)   Current Outpatient Medications (Other)  Medication Sig  . aspirin 325 MG tablet Take 325 mg by mouth daily.  Marland Kitchen atorvastatin (LIPITOR) 40 MG tablet Take 1 tablet (40 mg total) by mouth daily at 6 PM.  . clopidogrel (PLAVIX) 75 MG tablet TAKE 1 TABLET BY MOUTH EVERY DAY  . fluticasone (FLONASE) 50 MCG/ACT nasal spray Place 2 sprays into  both nostrils daily as needed for allergies.  Marland Kitchen HYDROcodone-acetaminophen (NORCO/VICODIN) 5-325 MG tablet Take 1 tablet by mouth every 6 (six) hours as needed.  . loratadine (CLARITIN) 10 MG tablet Take 10 mg by mouth daily as needed for allergies.  Marland Kitchen LORazepam (ATIVAN) 1 MG tablet Take 1 tablet (1 mg total) by mouth 3 (three) times daily as needed. (Patient taking differently: Take 1 mg by mouth as directed. Only for flying)  . ramipril (ALTACE) 10 MG capsule TAKE 1 CAPSULE BY MOUTH  DAILY  . zolpidem (AMBIEN) 10 MG tablet Take 0.5-1 tablets (5-10 mg total) by mouth at bedtime as needed for sleep.   No current facility-administered medications for this visit. (Other)      REVIEW OF SYSTEMS: ROS    Positive for: Gastrointestinal, Neurological, Musculoskeletal, Cardiovascular, Eyes   Negative for: Constitutional, Skin, Genitourinary, HENT, Endocrine, Respiratory, Psychiatric, Allergic/Imm, Heme/Lymph   Last edited by Theodore Demark, COA on 05/05/2020  8:35 AM. (History)       ALLERGIES Allergies  Allergen Reactions  . Sulfa Antibiotics Other (See Comments)    Lethargic and hypotension    PAST MEDICAL HISTORY Past Medical History:  Diagnosis Date  . COVID-19 07/2019  . History of blood transfusion 2014   "related to back OR"  . History of chicken pox   . Stroke (  [place])   . TIA (transient ischemic attack) 10/17/2017; 10/19/2017   Past Surgical History:  Procedure Laterality Date  . BACK SURGERY    . CATARACT EXTRACTION Right 05/2019   Dr. Talbert Forest  . CATARACT EXTRACTION Left 2017  . CATARACT EXTRACTION W/ INTRAOCULAR LENS IMPLANT Left   . COLONOSCOPY WITH PROPOFOL N/A 03/16/2016   Procedure: COLONOSCOPY WITH PROPOFOL;  Surgeon: Manya Silvas, MD;  Location: [facility] ENDOSCOPY;  Service: Endoscopy;  Laterality: N/A;  . ESOPHAGOGASTRODUODENOSCOPY (EGD) WITH ESOPHAGEAL DILATION  1990s  . EYE SURGERY Bilateral    Cat Sx  . IR ANGIO INTRA EXTRACRAN SEL COM CAROTID INNOMINATE BILAT  MOD SED  10/31/2017  . IR ANGIO INTRA EXTRACRAN SEL COM CAROTID INNOMINATE BILAT MOD SED  01/15/2019  . IR ANGIO INTRA EXTRACRAN SEL COM CAROTID INNOMINATE BILAT MOD SED  09/27/2019  . IR ANGIO VERTEBRAL SEL VERTEBRAL BILAT MOD SED  10/31/2017  . IR ANGIO VERTEBRAL SEL VERTEBRAL BILAT MOD SED  01/15/2019  . IR ANGIO VERTEBRAL SEL VERTEBRAL BILAT MOD SED  09/27/2019  . IR US GUIDE VASC ACCESS RIGHT  01/15/2019  . IR US GUIDE VASC ACCESS RIGHT  09/27/2019  . LUMBAR LAMINECTOMY/DECOMPRESSION MICRODISCECTOMY Left 07/26/2012   Procedure: LUMBAR LAMINECTOMY/DECOMPRESSION MICRODISCECTOMY 1 LEVEL;  Surgeon: Eustace Moore, MD;  Location: [facility]URO [facility]S;  Service: Neurosurgery;  Laterality: Left;  lumbar five sacral one  . SHOULDER ARTHROSCOPY WITH OPEN ROTATOR CUFF REPAIR Left 1990s  . TONSILLECTOMY      FAMILY HISTORY Family History  Problem Relation Age of Onset  . Heart disease Mother   . Cancer Sister   . Dementia Brother   . Dementia Sister   . Diabetes Brother   . Diabetes Sister   . Colon cancer Neg Hx   . Prostate cancer Neg Hx     SOCIAL HISTORY Social History   Tobacco Use  . Smoking status: Former Smoker    Packs/day: 1.00    Years: 25.00    Pack years: 25.00    Quit date: 03/03/1988    Years since quitting: 32.1  . Smokeless tobacco: Never Used  Vaping Use  . Vaping Use: Never used  Substance Use Topics  . Alcohol use: Yes    Alcohol/week: 1.0 standard drink    Types: 1 Standard drinks or equivalent per week  . Drug use: Never         OPHTHALMIC EXAM:  Base Eye Exam    Visual Acuity (Snellen - Linear)      Right Left   Dist [place] 20/20 20/50 -2   Dist ph [place]  NI       Tonometry (Tonopen, 8:33 AM)      Right Left   Pressure 16 20       Pupils      Dark Light Shape React APD   Right 3 2 Round Brisk None   Left 2 1 Round Brisk None       Visual Fields (Counting fingers)      Left Right    Full Full       Extraocular Movement      Right Left    Full Full        Neuro/Psych    Oriented x3: Yes   Mood/Affect: Normal       Dilation    Both eyes: 1.0% Mydriacyl, 2.5% Phenylephrine @ 8:33 AM        Slit Lamp and Fundus Exam    Slit Lamp Exam  Right Left   Lids/Lashes Dermatochalasis - upper lid Dermatochalasis - upper lid   Conjunctiva/Sclera Mild nasal Pinguecula 1+ Injection, prominent limbal vessels nasally   Cornea Mild arcus, well healed temporal cataract wounds, 1-2+ inferior Punctate epithelial erosions, trace Debris in tear film Mild arcus, well healed temporal cataract wounds, 2+ Punctate epithelial erosions, mild Debris in tear film, decreased TVUT   Anterior Chamber Deep and quiet Deep, 0.5+pigment   Iris Round and dilated mild iridodonesis, Round and poorly dilated to 3.0   Lens PC IOL in good position PC IOL with fine pigment depositon, open PC   Vitreous Vitreous syneresis, Posterior vitreous detachment, vitreous condensations Vitreous syneresis       Fundus Exam      Right Left   Disc Pink and Sharp Pink and Sharp, +PPA   C/D Ratio 0.5 0.6   Macula Flat, Blunted foveal reflex, mild RPE mottling and clumping, No heme or edema Blunted foveal reflex, central CME - improved, +ERM, RPE mottling and clumping, no heme   Vessels Attenuated, tortuous, mild AV crossing changes, mild Copper wiring Attenuated, tortuous, mild AV crossing changes, mild Copper wiring   Periphery Attached    limited view due to poor dilation, attached, no heme, inferior paving stone degeneration / CR atrophy          IMAGING AND PROCEDURES  Imaging and Procedures for 05/05/2020  OCT, Retina - OU - Both Eyes       Right Eye Quality was good. Central Foveal Thickness: 314. Progression has been stable. Findings include normal foveal contour, no IRF, no SRF.   Left Eye Quality was good. Central Foveal Thickness: 366. Progression has improved. Findings include abnormal foveal contour, epiretinal membrane, no SRF, no IRF (Interval improvement in  central IRF -- just tr cystic changes remain).   Notes *Images captured and stored on drive  Diagnosis / Impression:  OD: NFP, no IRF/SRF OS: Interval improvement in central IRF -- just tr cystic changes remain; +ERM  Clinical management:  See below  Abbreviations: NFP - Normal foveal profile. CME - cystoid macular edema. PED - pigment epithelial detachment. IRF - intraretinal fluid. SRF - subretinal fluid. EZ - ellipsoid zone. ERM - epiretinal membrane. ORA - outer retinal atrophy. ORT - outer retinal tubulation. SRHM - subretinal hyper-reflective material. IRHM - intraretinal hyper-reflective material                 ASSESSMENT/PLAN:    ICD-10-CM   1. Cystoid macular edema of left eye  H35.352   2. Retinal edema  H35.81 OCT, Retina - OU - Both Eyes  3. History of retinal detachment  Z86.69   4. Posterior vitreous detachment of right eye  H43.811   5. Essential hypertension  I10   6. Hypertensive retinopathy of both eyes  H35.033   7. Pseudophakia, both eyes  Z96.1   8. Primary open angle glaucoma of both eyes, unspecified glaucoma stage  H40.1130   9. Dry eyes  H04.123     1,2. CME OS  - pt with history of complicated CEIOL OS w/ RD in 2016  - history of CME since 2016 previously managed by Dr. Armanda Heritage   - was receiving intravitreal injections per pt report -- last visit w/ Dr. Posey Pronto in January 2021  - started on PF and Prolensa QID OS on 09.02.21  - stopped latanoprost on 12.14.21  - BCVA OS stable at 20/50  - OCT shows interval improvement in IRF/SRF and foveal profile OS  -  history of glaucoma and currently on timolol qdaily OU, Alphagan P BID OU, and Latanoprost qhs OU  - continue PF and Prolensa QID OS  - discussed possibility of STK OS -- pt wishes to continue to treat topically for now -- reasonable  - f/u 6 weeks, DFE, OCT  3. History of RD OS  - 2016 after cataract surgery Manuella Ghazi)  - s/p PPV w/ Dr. Posey Pronto in 2016  - retina attached; appears stable  -  monitor  4. PVD / vitreous syneresis OD  - symptomatic floaters OD -- worsening per pt report  - Discussed findings and prognosis  - No RT or RD on 360 peripheral exam  - Reviewed s/s of RT/RD  - Strict return precautions for any such RT/RD signs/symptoms  - f/u in 4-5 wks -- DFE/OCT  5,6. Hypertensive retinopathy OU - discussed importance of tight BP control - monitor  7. Pseudophakia OU  - s/p CE/IOL (OD: Dr. Talbert Forest, OS: Dr. Ruthell Rummage, 2016)  - s/p YAG OS -- [place], May 2021  - OS with iridonesis and poor dilation - monitor  8. POAG OU  - IOP 16,20 OU today -- pt did not use Alphagan P this morning  - currently on timolol qdaily OU, Alphagan P BID OU, and Latanoprost qhs OU  - pt has stopped latan OU as above  - pt reports irritation w/ timolol  9. Dry eyes OU - recommend artificial tears and lubricating ointment as needed    Ophthalmic Meds Ordered this visit:  Meds ordered this encounter  Medications  . Bromfenac Sodium (PROLENSA) 0.07 % SOLN    Sig: Place 1 drop into the left eye 4 (four) times daily.    Dispense:  6 mL    Refill:  3       Return in about 6 weeks (around 06/16/2020) for CME OS - Dilated Exam, OCT.  There are no Patient Instructions on file for this visit.  This document serves as a record of services personally performed by Gardiner Sleeper, MD, PhD. It was created on their behalf by Leeann Must, [place], an ophthalmic technician. The creation of this record is the provider's dictation and/or activities during the visit.    Electronically signed by: Leeann Must, [place] 01.20.2021 1:39 PM   This document serves as a record of services personally performed by Gardiner Sleeper, MD, PhD. It was created on their behalf by San Jetty. Owens Shark, OA an ophthalmic technician. The creation of this record is the provider's dictation and/or activities during the visit.    Electronically signed by: San Jetty. Owens Shark, [place] 01.25.2021 1:39 PM  Gardiner Sleeper, M.D.,  Ph.D. Diseases & Surgery of the Retina and [place] 05/05/2020   I have reviewed the above documentation for accuracy and completeness, and I agree with the above. Gardiner Sleeper, M.D., Ph.D. 05/05/20 1:39 PM   Abbreviations: M myopia (nearsighted); A astigmatism; H hyperopia (farsighted); P presbyopia; Mrx spectacle prescription;  CTL contact lenses; OD right eye; OS left eye; OU both eyes  XT exotropia; ET esotropia; PEK punctate epithelial keratitis; PEE punctate epithelial erosions; DES dry eye syndrome; MGD meibomian gland dysfunction; ATs artificial tears; PFAT's preservative free artificial tears; [place] nuclear sclerotic cataract; PSC posterior subcapsular cataract; ERM epi-retinal membrane; PVD posterior vitreous detachment; RD retinal detachment; DM diabetes mellitus; DR diabetic retinopathy; NPDR non-proliferative diabetic retinopathy; PDR proliferative diabetic retinopathy; CSME clinically significant macular edema; DME diabetic macular edema; dbh dot blot  hemorrhages; CWS cotton wool spot; POAG primary open angle glaucoma; C/D cup-to-disc ratio; HVF humphrey visual field; GVF goldmann visual field; OCT optical coherence tomography; IOP intraocular pressure; BRVO Branch retinal vein occlusion; CRVO central retinal vein occlusion; CRAO central retinal artery occlusion; BRAO branch retinal artery occlusion; RT retinal tear; SB scleral buckle; PPV pars plana vitrectomy; VH Vitreous hemorrhage; PRP panretinal laser photocoagulation; IVK intravitreal kenalog; VMT vitreomacular traction; MH Macular hole;  NVD neovascularization of the disc; NVE neovascularization elsewhere; AREDS age related eye disease study; ARMD age related macular degeneration; POAG primary open angle glaucoma; EBMD epithelial/anterior basement membrane dystrophy; ACIOL anterior chamber intraocular lens; IOL intraocular lens; PCIOL posterior chamber intraocular lens; Phaco/IOL phacoemulsification  with intraocular lens placement; [place] photorefractive keratectomy; LASIK laser assisted in situ keratomileusis; HTN hypertension; DM diabetes mellitus; COPD chronic obstructive pulmonary disease

## 2020-05-05 ENCOUNTER — Encounter (INDEPENDENT_AMBULATORY_CARE_PROVIDER_SITE_OTHER): Payer: Self-pay | Admitting: Ophthalmology

## 2020-05-05 ENCOUNTER — Ambulatory Visit (INDEPENDENT_AMBULATORY_CARE_PROVIDER_SITE_OTHER): Payer: Medicare Other | Admitting: Ophthalmology

## 2020-05-05 ENCOUNTER — Other Ambulatory Visit: Payer: Self-pay

## 2020-05-05 DIAGNOSIS — Z8669 Personal history of other diseases of the nervous system and sense organs: Secondary | ICD-10-CM | POA: Diagnosis not present

## 2020-05-05 DIAGNOSIS — H35352 Cystoid macular degeneration, left eye: Secondary | ICD-10-CM

## 2020-05-05 DIAGNOSIS — I10 Essential (primary) hypertension: Secondary | ICD-10-CM

## 2020-05-05 DIAGNOSIS — H3581 Retinal edema: Secondary | ICD-10-CM | POA: Diagnosis not present

## 2020-05-05 DIAGNOSIS — H04123 Dry eye syndrome of bilateral lacrimal glands: Secondary | ICD-10-CM

## 2020-05-05 DIAGNOSIS — H43811 Vitreous degeneration, right eye: Secondary | ICD-10-CM

## 2020-05-05 DIAGNOSIS — Z961 Presence of intraocular lens: Secondary | ICD-10-CM

## 2020-05-05 DIAGNOSIS — H401130 Primary open-angle glaucoma, bilateral, stage unspecified: Secondary | ICD-10-CM

## 2020-05-05 DIAGNOSIS — H35033 Hypertensive retinopathy, bilateral: Secondary | ICD-10-CM

## 2020-05-05 MED ORDER — PROLENSA 0.07 % OP SOLN: 1.0000 [drp] | Freq: Four times a day (QID) | OPHTHALMIC | 3 refills | Status: DC

## 2020-05-26 ENCOUNTER — Other Ambulatory Visit: Payer: Self-pay

## 2020-05-26 ENCOUNTER — Ambulatory Visit (INDEPENDENT_AMBULATORY_CARE_PROVIDER_SITE_OTHER): Payer: Medicare Other | Admitting: Dermatology

## 2020-05-26 ENCOUNTER — Encounter: Payer: Self-pay | Admitting: Dermatology

## 2020-05-26 DIAGNOSIS — L578 Other skin changes due to chronic exposure to nonionizing radiation: Secondary | ICD-10-CM

## 2020-05-26 DIAGNOSIS — L570 Actinic keratosis: Secondary | ICD-10-CM

## 2020-05-26 NOTE — Patient Instructions (Signed)
Cryotherapy Aftercare  . Wash gently with soap and water everyday.   . Apply Vaseline and Band-Aid daily until healed.  

## 2020-05-26 NOTE — Progress Notes (Signed)
   Follow-Up Visit   Subjective  Garrett Green is a 75 y.o. male who presents for the following: Actinic Keratosis (2 months f/u Aks treated with LN2 on the scalp and L hand, treated L forehead, brow and temple with 5FU with a good response ).  Wife and daughter with pt  The following portions of the chart were reviewed this encounter and updated as appropriate:   Tobacco  Allergies  Meds  Problems  Med Hx  Surg Hx  Fam Hx      Review of Systems:  No other skin or systemic complaints except as noted in HPI or Assessment and Plan.  Objective  Well appearing patient in no apparent distress; mood and affect are within normal limits.  A focused examination was performed including face, neck, chest and back and face, scalp, neck, hands . Relevant physical exam findings are noted in the Assessment and Plan.  Objective  Nose: Erythematous thin papules/macules with gritty scale on the nose   Assessment & Plan  AK (actinic keratosis) Nose  Prior to procedure, discussed risks of blister formation, small wound, skin dyspigmentation, or rare scar following cryotherapy.    Destruction of lesion - Nose Complexity: simple   Destruction method: cryotherapy   Informed consent: discussed and consent obtained   Timeout:  patient name, date of birth, surgical site, and procedure verified Lesion destroyed using liquid nitrogen: Yes   Region frozen until ice ball extended beyond lesion: Yes   Outcome: patient tolerated procedure well with no complications   Post-procedure details: wound care instructions given    Actinic Damage - chronic, secondary to cumulative UV radiation exposure/sun exposure over time. Improved s/p 5FU/calcipotriene to left temple and cheek - diffuse scaly erythematous macules with underlying dyspigmentation - Recommend daily broad spectrum sunscreen SPF 30+ to sun-exposed areas, reapply every 2 hours as needed.  - Call for new or changing lesions.  Return for  as scheduled Dec 2022 for TBSE .   I, Marye Round, CMA, am acting as scribe for Forest Gleason, MD .  Documentation: I have reviewed the above documentation for accuracy and completeness, and I agree with the above.  Forest Gleason, MD

## 2020-06-03 ENCOUNTER — Ambulatory Visit: Payer: Medicare Other | Admitting: Dermatology

## 2020-06-08 NOTE — Progress Notes (Signed)
[facility] Note  06/16/2020     CHIEF COMPLAINT Patient presents for Retina Follow Up   HISTORY OF PRESENT ILLNESS: Garrett Green is a 75 y.o. male who presents to the clinic today for:  HPI    Retina Follow Up    Patient presents with  Other.  In left eye.  This started months ago.  Severity is moderate.  Duration of 6 weeks.  Since onset it is stable.  I, the attending physician,  performed the HPI with the patient and updated documentation appropriately.          Comments    75 y/o male pt here for 6 wk f/u for CME OS.  No change in [place] OU.  Denies pain, FOL, new floaters.  PF and Prolensa QID OS.  Timolol QD OU, Alphagan P BID OU.       Last edited by Bernarda Caffey, MD on 06/17/2020 12:48 AM. (History)    pt states vision seems the same  Referring physician: Birdie Sons, MD [address] [place],  [place] [address]  HISTORICAL INFORMATION:   Selected notes from the MEDICAL RECORD NUMBER    CURRENT MEDICATIONS: Current Outpatient Medications (Ophthalmic Drugs)  Medication Sig   brimonidine (ALPHAGAN P) 0.1 % SOLN Place 1 drop into the left eye 2 (two) times daily.   Bromfenac Sodium (PROLENSA) 0.07 % SOLN Place 1 drop into the left eye 4 (four) times daily.   latanoprost (XALATAN) 0.005 % ophthalmic solution Place 1 drop into both eyes at bedtime.   prednisoLONE acetate (PRED FORTE) 1 % ophthalmic suspension Place 1 drop into the left eye 4 (four) times daily.   timolol (TIMOPTIC) 0.5 % ophthalmic solution INSTILL ONE DROP TO THE AFFECTED EYE TWICE DAILY   No current facility-administered medications for this visit. (Ophthalmic Drugs)   Current Outpatient Medications (Other)  Medication Sig   aspirin 325 MG tablet Take 325 mg by mouth daily.   atorvastatin (LIPITOR) 40 MG tablet Take 1 tablet (40 mg total) by mouth daily at 6 PM.   benzonatate (TESSALON) 200 MG capsule Take by mouth.   clopidogrel (PLAVIX) 75 MG  tablet TAKE 1 TABLET BY MOUTH EVERY DAY   fluticasone (FLONASE) 50 MCG/ACT nasal spray Place 2 sprays into both nostrils daily as needed for allergies.   HYDROcodone-acetaminophen (NORCO/VICODIN) 5-325 MG tablet Take 1 tablet by mouth every 6 (six) hours as needed.   loratadine (CLARITIN) 10 MG tablet Take 10 mg by mouth daily as needed for allergies.   LORazepam (ATIVAN) 1 MG tablet Take 1 tablet (1 mg total) by mouth 3 (three) times daily as needed. (Patient taking differently: Take 1 mg by mouth as directed. Only for flying)   ramipril (ALTACE) 10 MG capsule TAKE 1 CAPSULE BY MOUTH  DAILY   zolpidem (AMBIEN) 10 MG tablet Take 0.5-1 tablets (5-10 mg total) by mouth at bedtime as needed for sleep.   No current facility-administered medications for this visit. (Other)      REVIEW OF SYSTEMS: ROS    Positive for: Gastrointestinal, Musculoskeletal, Cardiovascular, Eyes   Negative for: Constitutional, Neurological, Skin, Genitourinary, HENT, Endocrine, Respiratory, Psychiatric, Allergic/Imm, Heme/Lymph   Last edited by Matthew Folks, COA on 06/16/2020  8:59 AM. (History)       ALLERGIES Allergies  Allergen Reactions   Sulfa Antibiotics Other (See Comments)    Lethargic and hypotension    PAST MEDICAL HISTORY Past Medical History:  Diagnosis Date   COVID-19 07/2019   History of blood transfusion 2014   "related to back OR"   History of chicken pox    Hypertensive retinopathy    OU   Retinal detachment    OS   Stroke ([place])    TIA (transient ischemic attack) 10/17/2017; 10/19/2017   Past Surgical History:  Procedure Laterality Date   BACK SURGERY     CATARACT EXTRACTION Right 05/2019   Dr. Talbert Forest   CATARACT EXTRACTION Left 2017   CATARACT EXTRACTION W/ INTRAOCULAR LENS IMPLANT Left    COLONOSCOPY WITH PROPOFOL N/A 03/16/2016   Procedure: COLONOSCOPY WITH PROPOFOL;  Surgeon: Manya Silvas, MD;  Location: [facility];  Service: Endoscopy;  Laterality:  N/A;   ESOPHAGOGASTRODUODENOSCOPY (EGD) WITH ESOPHAGEAL DILATION  1990s   EYE SURGERY Bilateral    Cat Sx   IR ANGIO INTRA EXTRACRAN SEL COM CAROTID INNOMINATE BILAT MOD SED  10/31/2017   IR ANGIO INTRA EXTRACRAN SEL COM CAROTID INNOMINATE BILAT MOD SED  01/15/2019   IR ANGIO INTRA EXTRACRAN SEL COM CAROTID INNOMINATE BILAT MOD SED  09/27/2019   IR ANGIO VERTEBRAL SEL VERTEBRAL BILAT MOD SED  10/31/2017   IR ANGIO VERTEBRAL SEL VERTEBRAL BILAT MOD SED  01/15/2019   IR ANGIO VERTEBRAL SEL VERTEBRAL BILAT MOD SED  09/27/2019   IR US GUIDE VASC ACCESS RIGHT  01/15/2019   IR US GUIDE VASC ACCESS RIGHT  09/27/2019   LUMBAR LAMINECTOMY/DECOMPRESSION MICRODISCECTOMY Left 07/26/2012   Procedure: LUMBAR LAMINECTOMY/DECOMPRESSION MICRODISCECTOMY 1 LEVEL;  Surgeon: Eustace Moore, MD;  Location: [facility];  Service: Neurosurgery;  Laterality: Left;  lumbar five sacral one   RETINAL DETACHMENT SURGERY Left 2016   Dr. Posey Pronto   SHOULDER ARTHROSCOPY WITH OPEN ROTATOR CUFF REPAIR Left 1990s   TONSILLECTOMY     YAG LASER APPLICATION Left 43/3295   Dr. Talbert Forest    FAMILY HISTORY Family History  Problem Relation Age of Onset   Heart disease Mother    Cancer Sister    Dementia Brother    Dementia Sister    Diabetes Brother    Diabetes Sister    Colon cancer Neg Hx    Prostate cancer Neg Hx     SOCIAL HISTORY Social History   Tobacco Use   Smoking status: Former Smoker    Packs/day: 1.00    Years: 25.00    Pack years: 25.00    Quit date: 03/03/1988    Years since quitting: 32.3   Smokeless tobacco: Never Used  Vaping Use   Vaping Use: Never used  Substance Use Topics   Alcohol use: Yes    Alcohol/week: 1.0 standard drink    Types: 1 Standard drinks or equivalent per week   Drug use: Never         OPHTHALMIC EXAM:  Base Eye Exam    Visual Acuity (Snellen - Linear)      Right Left   Dist [place] 20/20 20/50 -2   Dist ph [place]  NI       Tonometry (Tonopen, 9:02 AM)       Right Left   Pressure 17 18       Pupils      Dark Light Shape React APD   Right 3 2 Round Brisk None   Left 2 1 Round Brisk None       Visual Fields (Counting fingers)      Left Right    Full Full       Extraocular  Movement      Right Left    Full, Ortho Full, Ortho       Neuro/Psych    Oriented x3: Yes   Mood/Affect: Normal       Dilation    Both eyes: 1.0% Mydriacyl, 2.5% Phenylephrine @ 9:02 AM        Slit Lamp and Fundus Exam    Slit Lamp Exam      Right Left   Lids/Lashes Dermatochalasis - upper lid Dermatochalasis - upper lid   Conjunctiva/Sclera Mild nasal Pinguecula 1+ Injection, prominent limbal vessels nasally   Cornea Mild arcus, well healed temporal cataract wounds, 1-2+ inferior Punctate epithelial erosions, trace Debris in tear film Mild arcus, well healed temporal cataract wounds, 2+ Punctate epithelial erosions, mild Debris in tear film   Anterior Chamber Deep and quiet Deep, 0.5+cell/pigment   Iris Round and dilated mild iridodonesis, Round and poorly dilated to 3.5   Lens PC IOL in good position PC IOL with fine pigment depositon, open PC   Vitreous Vitreous syneresis, Posterior vitreous detachment, vitreous condensations Vitreous syneresis       Fundus Exam      Right Left   Disc Pink and Sharp Pink and Sharp, +PPA   C/D Ratio 0.5 0.6   Macula Flat, Blunted foveal reflex, mild RPE mottling and clumping, No heme or edema Blunted foveal reflex, central CME - improved, +ERM, RPE mottling and clumping, no heme   Vessels Attenuated, tortuous, mild AV crossing changes, mild Copper wiring Attenuated, tortuous, mild AV crossing changes, mild Copper wiring   Periphery Attached    limited view due to poor dilation, attached, no heme, inferior paving stone degeneration / CR atrophy          IMAGING AND PROCEDURES  Imaging and Procedures for 06/16/2020  OCT, Retina - OU - Both Eyes       Right Eye Quality was good. Central Foveal Thickness:  307. Progression has been stable. Findings include normal foveal contour, no IRF, no SRF.   Left Eye Quality was good. Central Foveal Thickness: 365. Progression has been stable. Findings include abnormal foveal contour, epiretinal membrane, no SRF, no IRF (stable improvement in central IRF -- just tr cystic changes remain).   Notes *Images captured and stored on drive  Diagnosis / Impression:  OD: NFP, no IRF/SRF OS: stable improvement in central IRF -- just tr cystic changes remain; +ERM  Clinical management:  See below  Abbreviations: NFP - Normal foveal profile. CME - cystoid macular edema. PED - pigment epithelial detachment. IRF - intraretinal fluid. SRF - subretinal fluid. EZ - ellipsoid zone. ERM - epiretinal membrane. ORA - outer retinal atrophy. ORT - outer retinal tubulation. SRHM - subretinal hyper-reflective material. IRHM - intraretinal hyper-reflective material                 ASSESSMENT/PLAN:    ICD-10-CM   1. Cystoid macular edema of left eye  H35.352   2. Retinal edema  H35.81 OCT, Retina - OU - Both Eyes  3. History of retinal detachment  Z86.69   4. Posterior vitreous detachment of right eye  H43.811   5. Essential hypertension  I10   6. Hypertensive retinopathy of both eyes  H35.033   7. Pseudophakia, both eyes  Z96.1   8. Primary open angle glaucoma of both eyes, unspecified glaucoma stage  H40.1130   9. Dry eyes  H04.123     1,2. CME OS  - pt with history of complicated CEIOL  OS w/ RD in 2016  - history of CME since 2016 previously managed by Dr. Armanda Heritage   - was receiving intravitreal injections per pt report -- last visit w/ Dr. Posey Pronto in January 2021  - started on PF and Prolensa QID OS on 09.02.21  - stopped latanoprost on 12.14.21  - BCVA OS stable at 20/50  - OCT shows stable improvement in IRF/SRF and foveal profile OS -- limited by ERM  - history of glaucoma and currently on timolol qdaily OU, Alphagan P BID OU, and Latanoprost qhs  OU  - start slow PF and Prolensa taper -- TID x3 weeks, then BID until f/u in 6 wks  - f/u 6 weeks, DFE, OCT  3. History of RD OS  - 2016 after cataract surgery Manuella Ghazi)  - s/p PPV w/ Dr. Posey Pronto in 2016  - retina attached; appears stable  - monitor  4. PVD / vitreous syneresis OD  - symptomatic floaters OD -- worsening per pt report  - Discussed findings and prognosis  - No RT or RD on 360 peripheral exam  - Reviewed s/s of RT/RD  - Strict return precautions for any such RT/RD signs/symptoms  - f/u in 6 wks -- DFE/OCT  5,6. Hypertensive retinopathy OU - discussed importance of tight BP control - monitor  7. Pseudophakia OU  - s/p CE/IOL (OD: Dr. Talbert Forest, OS: Dr. Ruthell Rummage, 2016)  - s/p YAG OS -- [place], May 2021  - OS with iridonesis and poor dilation - monitor  8. POAG OU  - IOP 17,18 OU today  - currently on timolol qdaily OU, Alphagan P BID OU, and Latanoprost qhs OU  - pt has stopped latan OU as above  - pt reports irritation w/ timolol  9. Dry eyes OU - recommend artificial tears and lubricating ointment as needed    Ophthalmic Meds Ordered this visit:  No orders of the defined types were placed in this encounter.      Return in about 6 weeks (around 07/28/2020) for f/u CME OS, DFE, OCT.  There are no Patient Instructions on file for this visit.  This document serves as a record of services personally performed by Gardiner Sleeper, MD, PhD. It was created on their behalf by Estill Bakes, COT an ophthalmic technician. The creation of this record is the provider's dictation and/or activities during the visit.    Electronically signed by: Estill Bakes, COT 2.28.22 @ 12:51 AM  Gardiner Sleeper, M.D., Ph.D. Diseases & Surgery of the Retina and [place] 06/16/2020   I have reviewed the above documentation for accuracy and completeness, and I agree with the above. Gardiner Sleeper, M.D., Ph.D. 06/17/20 12:51 AM   Abbreviations: M  myopia (nearsighted); A astigmatism; H hyperopia (farsighted); P presbyopia; Mrx spectacle prescription;  CTL contact lenses; OD right eye; OS left eye; OU both eyes  XT exotropia; ET esotropia; PEK punctate epithelial keratitis; PEE punctate epithelial erosions; DES dry eye syndrome; MGD meibomian gland dysfunction; ATs artificial tears; PFAT's preservative free artificial tears; [employer] nuclear sclerotic cataract; PSC posterior subcapsular cataract; ERM epi-retinal membrane; PVD posterior vitreous detachment; RD retinal detachment; DM diabetes mellitus; DR diabetic retinopathy; NPDR non-proliferative diabetic retinopathy; PDR proliferative diabetic retinopathy; CSME clinically significant macular edema; DME diabetic macular edema; dbh dot blot hemorrhages; CWS cotton wool spot; POAG primary open angle glaucoma; C/D cup-to-disc ratio; HVF humphrey visual field; GVF goldmann visual field; OCT optical coherence tomography; IOP intraocular pressure; BRVO  Branch retinal vein occlusion; CRVO central retinal vein occlusion; CRAO central retinal artery occlusion; BRAO branch retinal artery occlusion; RT retinal tear; SB scleral buckle; PPV pars plana vitrectomy; VH Vitreous hemorrhage; PRP panretinal laser photocoagulation; IVK intravitreal kenalog; VMT vitreomacular traction; MH Macular hole;  NVD neovascularization of the disc; NVE neovascularization elsewhere; AREDS age related eye disease study; ARMD age related macular degeneration; POAG primary open angle glaucoma; EBMD epithelial/anterior basement membrane dystrophy; ACIOL anterior chamber intraocular lens; IOL intraocular lens; PCIOL posterior chamber intraocular lens; Phaco/IOL phacoemulsification with intraocular lens placement; [place] photorefractive keratectomy; LASIK laser assisted in situ keratomileusis; HTN hypertension; DM diabetes mellitus; COPD chronic obstructive pulmonary disease

## 2020-06-16 ENCOUNTER — Encounter (INDEPENDENT_AMBULATORY_CARE_PROVIDER_SITE_OTHER): Payer: Self-pay | Admitting: Ophthalmology

## 2020-06-16 ENCOUNTER — Other Ambulatory Visit: Payer: Self-pay

## 2020-06-16 ENCOUNTER — Ambulatory Visit (INDEPENDENT_AMBULATORY_CARE_PROVIDER_SITE_OTHER): Payer: Medicare Other | Admitting: Ophthalmology

## 2020-06-16 DIAGNOSIS — H3581 Retinal edema: Secondary | ICD-10-CM | POA: Diagnosis not present

## 2020-06-16 DIAGNOSIS — H35352 Cystoid macular degeneration, left eye: Secondary | ICD-10-CM

## 2020-06-16 DIAGNOSIS — H401130 Primary open-angle glaucoma, bilateral, stage unspecified: Secondary | ICD-10-CM

## 2020-06-16 DIAGNOSIS — H43811 Vitreous degeneration, right eye: Secondary | ICD-10-CM | POA: Diagnosis not present

## 2020-06-16 DIAGNOSIS — H35033 Hypertensive retinopathy, bilateral: Secondary | ICD-10-CM

## 2020-06-16 DIAGNOSIS — Z8669 Personal history of other diseases of the nervous system and sense organs: Secondary | ICD-10-CM

## 2020-06-16 DIAGNOSIS — I10 Essential (primary) hypertension: Secondary | ICD-10-CM

## 2020-06-16 DIAGNOSIS — H04123 Dry eye syndrome of bilateral lacrimal glands: Secondary | ICD-10-CM

## 2020-06-16 DIAGNOSIS — Z961 Presence of intraocular lens: Secondary | ICD-10-CM

## 2020-06-17 ENCOUNTER — Encounter (INDEPENDENT_AMBULATORY_CARE_PROVIDER_SITE_OTHER): Payer: Self-pay | Admitting: Ophthalmology

## 2020-06-26 ENCOUNTER — Encounter: Payer: Self-pay | Admitting: Family Medicine

## 2020-06-26 ENCOUNTER — Other Ambulatory Visit: Payer: Self-pay | Admitting: *Deleted

## 2020-06-26 DIAGNOSIS — M47816 Spondylosis without myelopathy or radiculopathy, lumbar region: Secondary | ICD-10-CM

## 2020-06-26 MED ORDER — HYDROCODONE-ACETAMINOPHEN 5-325 MG PO TABS
1.0000 | ORAL_TABLET | Freq: Four times a day (QID) | ORAL | 0 refills | Status: DC | PRN
Start: 2020-06-26 — End: 2020-10-13

## 2020-07-24 ENCOUNTER — Other Ambulatory Visit (INDEPENDENT_AMBULATORY_CARE_PROVIDER_SITE_OTHER): Payer: Self-pay | Admitting: Ophthalmology

## 2020-07-27 NOTE — Progress Notes (Signed)
[facility] Note  07/28/2020     CHIEF COMPLAINT Patient presents for Retina Follow Up   HISTORY OF PRESENT ILLNESS: Garrett Green is a 75 y.o. male who presents to the clinic today for:  HPI    Retina Follow Up    Patient presents with  Other.  In left eye.  This started months ago.  Severity is moderate.  Duration of 6 weeks.  Since onset it is stable.  I, the attending physician,  performed the HPI with the patient and updated documentation appropriately.          Comments    75 y/o male pt here for 6 wk f/u for CME OS.  No major change in [place] OU.  Has occasional brief pain OS.  Denies FOL, has floaters OU.  Timolol QD OU, Alphagan P BID OU, Pred and Prolensa BID OS.       Last edited by Bernarda Caffey, MD on 07/29/2020 12:18 AM. (History)    pt is using PF and Prolensa BID OS and has not noticed any changes in vision  Referring physician: Birdie Sons, MD [address] [place],  [place] [address]  HISTORICAL INFORMATION:   Selected notes from the MEDICAL RECORD NUMBER    CURRENT MEDICATIONS: Current Outpatient Medications (Ophthalmic Drugs)  Medication Sig  . brimonidine (ALPHAGAN P) 0.1 % SOLN Place 1 drop into the left eye 2 (two) times daily.  . Bromfenac Sodium (PROLENSA) 0.07 % SOLN Place 1 drop into the left eye 4 (four) times daily.  Marland Kitchen latanoprost (XALATAN) 0.005 % ophthalmic solution Place 1 drop into both eyes at bedtime.  . prednisoLONE acetate (PRED FORTE) 1 % ophthalmic suspension Place 1 drop into the left eye 4 (four) times daily.  . timolol (TIMOPTIC) 0.5 % ophthalmic solution INSTILL ONE DROP TO THE AFFECTED EYE TWICE DAILY   No current facility-administered medications for this visit. (Ophthalmic Drugs)   Current Outpatient Medications (Other)  Medication Sig  . aspirin 325 MG tablet Take 325 mg by mouth daily.  Marland Kitchen atorvastatin (LIPITOR) 40 MG tablet Take 1 tablet (40 mg total) by mouth daily at 6 PM.  .  benzonatate (TESSALON) 200 MG capsule Take by mouth.  . clopidogrel (PLAVIX) 75 MG tablet TAKE 1 TABLET BY MOUTH EVERY DAY  . fluticasone (FLONASE) 50 MCG/ACT nasal spray Place 2 sprays into both nostrils daily as needed for allergies.  Marland Kitchen HYDROcodone-acetaminophen (NORCO/VICODIN) 5-325 MG tablet Take 1 tablet by mouth every 6 (six) hours as needed.  . loratadine (CLARITIN) 10 MG tablet Take 10 mg by mouth daily as needed for allergies.  Marland Kitchen LORazepam (ATIVAN) 1 MG tablet Take 1 tablet (1 mg total) by mouth 3 (three) times daily as needed. (Patient taking differently: Take 1 mg by mouth as directed. Only for flying)  . ramipril (ALTACE) 10 MG capsule TAKE 1 CAPSULE BY MOUTH  DAILY  . zolpidem (AMBIEN) 10 MG tablet Take 0.5-1 tablets (5-10 mg total) by mouth at bedtime as needed for sleep.   No current facility-administered medications for this visit. (Other)      REVIEW OF SYSTEMS: ROS    Positive for: Gastrointestinal, Neurological, Cardiovascular, Eyes   Negative for: Constitutional, Skin, Genitourinary, Musculoskeletal, HENT, Endocrine, Respiratory, Psychiatric, Allergic/Imm, Heme/Lymph   Last edited by Matthew Folks, COA on 07/28/2020  8:22 AM. (History)       ALLERGIES Allergies  Allergen Reactions  . Sulfa Antibiotics Other (See Comments)  Lethargic and hypotension    PAST MEDICAL HISTORY Past Medical History:  Diagnosis Date  . COVID-19 07/2019  . History of blood transfusion 2014   "related to back OR"  . History of chicken pox   . Hypertensive retinopathy    OU  . Retinal detachment    OS  . Stroke ([place])   . TIA (transient ischemic attack) 10/17/2017; 10/19/2017   Past Surgical History:  Procedure Laterality Date  . BACK SURGERY    . CATARACT EXTRACTION Right 05/2019   Dr. Talbert Forest  . CATARACT EXTRACTION Left 2017  . CATARACT EXTRACTION W/ INTRAOCULAR LENS IMPLANT Left   . COLONOSCOPY WITH PROPOFOL N/A 03/16/2016   Procedure: COLONOSCOPY WITH PROPOFOL;   Surgeon: Manya Silvas, MD;  Location: [facility] ENDOSCOPY;  Service: Endoscopy;  Laterality: N/A;  . ESOPHAGOGASTRODUODENOSCOPY (EGD) WITH ESOPHAGEAL DILATION  1990s  . EYE SURGERY Bilateral    Cat Sx  . IR ANGIO INTRA EXTRACRAN SEL COM CAROTID INNOMINATE BILAT MOD SED  10/31/2017  . IR ANGIO INTRA EXTRACRAN SEL COM CAROTID INNOMINATE BILAT MOD SED  01/15/2019  . IR ANGIO INTRA EXTRACRAN SEL COM CAROTID INNOMINATE BILAT MOD SED  09/27/2019  . IR ANGIO VERTEBRAL SEL VERTEBRAL BILAT MOD SED  10/31/2017  . IR ANGIO VERTEBRAL SEL VERTEBRAL BILAT MOD SED  01/15/2019  . IR ANGIO VERTEBRAL SEL VERTEBRAL BILAT MOD SED  09/27/2019  . IR US GUIDE VASC ACCESS RIGHT  01/15/2019  . IR US GUIDE VASC ACCESS RIGHT  09/27/2019  . LUMBAR LAMINECTOMY/DECOMPRESSION MICRODISCECTOMY Left 07/26/2012   Procedure: LUMBAR LAMINECTOMY/DECOMPRESSION MICRODISCECTOMY 1 LEVEL;  Surgeon: Eustace Moore, MD;  Location: [facility] NEURO ORS;  Service: Neurosurgery;  Laterality: Left;  lumbar five sacral one  . RETINAL DETACHMENT SURGERY Left 2016   Dr. Posey Pronto  . SHOULDER ARTHROSCOPY WITH OPEN ROTATOR CUFF REPAIR Left 1990s  . TONSILLECTOMY    . YAG LASER APPLICATION Left 16/1096   Dr. Talbert Forest    FAMILY HISTORY Family History  Problem Relation Age of Onset  . Heart disease Mother   . Cancer Sister   . Dementia Brother   . Dementia Sister   . Diabetes Brother   . Diabetes Sister   . Colon cancer Neg Hx   . Prostate cancer Neg Hx     SOCIAL HISTORY Social History   Tobacco Use  . Smoking status: Former Smoker    Packs/day: 1.00    Years: 25.00    Pack years: 25.00    Quit date: 03/03/1988    Years since quitting: 32.4  . Smokeless tobacco: Never Used  Vaping Use  . Vaping Use: Never used  Substance Use Topics  . Alcohol use: Yes    Alcohol/week: 1.0 standard drink    Types: 1 Standard drinks or equivalent per week  . Drug use: Never         OPHTHALMIC EXAM:  Base Eye Exam    Visual Acuity (Snellen - Linear)       Right Left   Dist [place] 20/25 20/50 -   Dist ph [place] 20/20 - NI       Tonometry (Tonopen, 8:23 AM)      Right Left   Pressure 16 17       Pupils      Dark Light Shape React APD   Right 3 2 Round Brisk None   Left 2 1 Round Brisk None       Visual Fields (Counting fingers)  Left Right    Full Full       Extraocular Movement      Right Left    Full, Ortho Full, Ortho       Neuro/Psych    Oriented x3: Yes   Mood/Affect: Normal       Dilation    Both eyes: 1.0% Mydriacyl, 2.5% Phenylephrine @ 8:23 AM        Slit Lamp and Fundus Exam    Slit Lamp Exam      Right Left   Lids/Lashes Dermatochalasis - upper lid Dermatochalasis - upper lid   Conjunctiva/Sclera Mild nasal Pinguecula 1+ Injection, prominent limbal vessels nasally   Cornea Mild arcus, well healed temporal cataract wounds, trace Punctate epithelial erosions, +Debris in tear film Mild arcus, well healed temporal cataract wounds, 2-3+ central Punctate epithelial erosions, mild Debris in tear film, decreased TBUT, trace endo pigment inferiorly   Anterior Chamber Deep and quiet Deep, 0.5+cell/pigment   Iris Round and dilated mild iridodonesis, Round and poorly dilated to 3.75   Lens PC IOL in good position PC IOL with fine pigment depositon, open PC   Vitreous Vitreous syneresis, Posterior vitreous detachment, vitreous condensations Vitreous syneresis       Fundus Exam      Right Left   Disc Pink and Sharp Pink and Sharp, +PPA   C/D Ratio 0.5 0.6   Macula Flat, Blunted foveal reflex, mild RPE mottling and clumping, No heme or edema Blunted foveal reflex, central CME - stably improved, +ERM, RPE mottling and clumping, no heme   Vessels Attenuated, tortuous, mild AV crossing changes, mild Copper wiring Attenuated, mild tortuousity   Periphery Attached    limited view due to poor dilation, attached, no heme, inferior paving stone degeneration / CR atrophy          IMAGING AND PROCEDURES  Imaging and Procedures  for 07/28/2020  OCT, Retina - OU - Both Eyes       Right Eye Quality was good. Central Foveal Thickness: 312. Progression has been stable. Findings include normal foveal contour, no IRF, no SRF.   Left Eye Quality was good. Central Foveal Thickness: 365. Progression has been stable. Findings include abnormal foveal contour, epiretinal membrane, no SRF, no IRF (stable improvement in central IRF).   Notes *Images captured and stored on drive  Diagnosis / Impression:  OD: NFP, no IRF/SRF OS: stable improvement in central IRF; +ERM  Clinical management:  See below  Abbreviations: NFP - Normal foveal profile. CME - cystoid macular edema. PED - pigment epithelial detachment. IRF - intraretinal fluid. SRF - subretinal fluid. EZ - ellipsoid zone. ERM - epiretinal membrane. ORA - outer retinal atrophy. ORT - outer retinal tubulation. SRHM - subretinal hyper-reflective material. IRHM - intraretinal hyper-reflective material                 ASSESSMENT/PLAN:    ICD-10-CM   1. Cystoid macular edema of left eye  H35.352   2. Retinal edema  H35.81 OCT, Retina - OU - Both Eyes  3. History of retinal detachment  Z86.69   4. Posterior vitreous detachment of right eye  H43.811   5. Essential hypertension  I10   6. Hypertensive retinopathy of both eyes  H35.033   7. Pseudophakia, both eyes  Z96.1   8. Primary open angle glaucoma of both eyes, unspecified glaucoma stage  H40.1130   9. Dry eyes  H04.123     1,2. CME OS  - pt with history of  complicated CEIOL OS w/ RD in 2016  - history of CME since 2016 previously managed by Dr. Armanda Heritage   - was receiving intravitreal injections per pt report -- last visit w/ Dr. Posey Pronto in January 2021  - started on PF and Prolensa QID OS here on 09.02.21  - stopped latanoprost on 12.14.21  - BCVA OS stable at 20/50  - OCT shows stable improvement in IRF/SRF and foveal profile OS -- limited by ERM  - history of glaucoma and currently on timolol  qdaily OU, Alphagan P BID OU, and Latanoprost qhs OU  - continue PF and Prolensa Qdaily  - discussed possible need for low dose, long-term maintenance PF/Prolensa, given history of recurrent CME  - f/u 6 weeks, DFE, OCT  3. History of RD OS  - 2016 after cataract surgery Manuella Ghazi)  - s/p PPV w/ Dr. Posey Pronto in 2016  - retina attached; appears stable  - monitor  4. PVD / vitreous syneresis OD  - symptomatic floaters OD -- worsening per pt report  - Discussed findings and prognosis  - No RT or RD on 360 peripheral exam  - Reviewed s/s of RT/RD  - Strict return precautions for any such RT/RD signs/symptoms  - f/u in 6 wks -- DFE/OCT  5,6. Hypertensive retinopathy OU - discussed importance of tight BP control - monitor  7. Pseudophakia OU  - s/p CE/IOL (OD: Dr. Talbert Forest, OS: Dr. Ruthell Rummage, 2016)  - s/p YAG OS -- [place], May 2021  - OS with iridonesis and poor dilation - monitor  8. POAG OU  - IOP 17,18 OU today  - currently on timolol qdaily OU, Alphagan P BID OU, and Latanoprost qhs OU  - pt has stopped latan OU as above  - pt reports irritation w/ timolol  9. Dry eyes OU - recommend artificial tears and lubricating ointment as needed    Ophthalmic Meds Ordered this visit:  No orders of the defined types were placed in this encounter.      Return in about 6 weeks (around 09/08/2020) for f/u CME OS, DFE, OCT.  There are no Patient Instructions on file for this visit.   This document serves as a record of services personally performed by Gardiner Sleeper, MD, PhD. It was created on their behalf by Estill Bakes, COT an ophthalmic technician. The creation of this record is the provider's dictation and/or activities during the visit.    Electronically signed by: Estill Bakes, COT @[phone] 12:20 AM   This document serves as a record of services personally performed by Gardiner Sleeper, MD, PhD. It was created on their behalf by San Jetty. Owens Shark, OA an ophthalmic technician. The  creation of this record is the provider's dictation and/or activities during the visit.    Electronically signed by: San Jetty. Marguerita Merles 04.19.2022 12:20 AM  Gardiner Sleeper, M.D., Ph.D. Diseases & Surgery of the Retina and [place] 07/28/2020   I have reviewed the above documentation for accuracy and completeness, and I agree with the above. Gardiner Sleeper, M.D., Ph.D. 07/29/20 12:20 AM  Abbreviations: M myopia (nearsighted); A astigmatism; H hyperopia (farsighted); P presbyopia; Mrx spectacle prescription;  CTL contact lenses; OD right eye; OS left eye; OU both eyes  XT exotropia; ET esotropia; PEK punctate epithelial keratitis; PEE punctate epithelial erosions; DES dry eye syndrome; MGD meibomian gland dysfunction; ATs artificial tears; PFAT's preservative free artificial tears; [place] nuclear sclerotic cataract; PSC posterior subcapsular cataract;  ERM epi-retinal membrane; PVD posterior vitreous detachment; RD retinal detachment; DM diabetes mellitus; DR diabetic retinopathy; NPDR non-proliferative diabetic retinopathy; PDR proliferative diabetic retinopathy; CSME clinically significant macular edema; DME diabetic macular edema; dbh dot blot hemorrhages; CWS cotton wool spot; POAG primary open angle glaucoma; C/D cup-to-disc ratio; HVF humphrey visual field; GVF goldmann visual field; OCT optical coherence tomography; IOP intraocular pressure; BRVO Branch retinal vein occlusion; CRVO central retinal vein occlusion; CRAO central retinal artery occlusion; BRAO branch retinal artery occlusion; RT retinal tear; SB scleral buckle; PPV pars plana vitrectomy; VH Vitreous hemorrhage; PRP panretinal laser photocoagulation; IVK intravitreal kenalog; VMT vitreomacular traction; MH Macular hole;  NVD neovascularization of the disc; NVE neovascularization elsewhere; AREDS age related eye disease study; ARMD age related macular degeneration; POAG primary open angle glaucoma; EBMD  epithelial/anterior basement membrane dystrophy; ACIOL anterior chamber intraocular lens; IOL intraocular lens; PCIOL posterior chamber intraocular lens; Phaco/IOL phacoemulsification with intraocular lens placement; [place] photorefractive keratectomy; LASIK laser assisted in situ keratomileusis; HTN hypertension; DM diabetes mellitus; COPD chronic obstructive pulmonary disease

## 2020-07-28 ENCOUNTER — Encounter (INDEPENDENT_AMBULATORY_CARE_PROVIDER_SITE_OTHER): Payer: Self-pay | Admitting: Ophthalmology

## 2020-07-28 ENCOUNTER — Other Ambulatory Visit: Payer: Self-pay

## 2020-07-28 ENCOUNTER — Ambulatory Visit (INDEPENDENT_AMBULATORY_CARE_PROVIDER_SITE_OTHER): Payer: Medicare Other | Admitting: Ophthalmology

## 2020-07-28 DIAGNOSIS — H35033 Hypertensive retinopathy, bilateral: Secondary | ICD-10-CM

## 2020-07-28 DIAGNOSIS — H3581 Retinal edema: Secondary | ICD-10-CM

## 2020-07-28 DIAGNOSIS — Z8669 Personal history of other diseases of the nervous system and sense organs: Secondary | ICD-10-CM | POA: Diagnosis not present

## 2020-07-28 DIAGNOSIS — H35352 Cystoid macular degeneration, left eye: Secondary | ICD-10-CM

## 2020-07-28 DIAGNOSIS — H43811 Vitreous degeneration, right eye: Secondary | ICD-10-CM

## 2020-07-28 DIAGNOSIS — H401130 Primary open-angle glaucoma, bilateral, stage unspecified: Secondary | ICD-10-CM

## 2020-07-28 DIAGNOSIS — I10 Essential (primary) hypertension: Secondary | ICD-10-CM

## 2020-07-28 DIAGNOSIS — Z961 Presence of intraocular lens: Secondary | ICD-10-CM

## 2020-07-28 DIAGNOSIS — H04123 Dry eye syndrome of bilateral lacrimal glands: Secondary | ICD-10-CM

## 2020-07-29 ENCOUNTER — Encounter (INDEPENDENT_AMBULATORY_CARE_PROVIDER_SITE_OTHER): Payer: Self-pay | Admitting: Ophthalmology

## 2020-08-02 ENCOUNTER — Other Ambulatory Visit: Payer: Self-pay | Admitting: Family Medicine

## 2020-08-03 NOTE — Telephone Encounter (Signed)
Requested medication (s) are due for refill today: no  Requested medication (s) are on the active medication list: yes  Last refill: 05/21/2020 Future visit scheduled: no  Notes to clinic: Patient is due for labs and follow up    Requested Prescriptions  Pending Prescriptions Disp Refills   atorvastatin (LIPITOR) 40 MG tablet [Pharmacy Med Name: ATORVASTATIN 40 MG TABLET] 90 tablet 3    Sig: TAKE 1 TABLET BY MOUTH DAILY AT 6:00PM      Cardiovascular:  Antilipid - Statins Failed - 08/02/2020  7:18 PM      Failed - Total Cholesterol in normal range and within 360 days    Cholesterol, Total  Date Value Ref Range Status  07/09/2019 107 100 - 199 mg/dL Final          Failed - LDL in normal range and within 360 days    LDL Chol Calc (NIH)  Date Value Ref Range Status  07/09/2019 40 0 - 99 mg/dL Final          Failed - HDL in normal range and within 360 days    HDL  Date Value Ref Range Status  07/09/2019 52 >39 mg/dL Final          Failed - Triglycerides in normal range and within 360 days    Triglycerides  Date Value Ref Range Status  07/09/2019 70 0 - 149 mg/dL Final          Failed - Valid encounter within last 12 months    Recent Outpatient Visits           7 months ago Prostate cancer screening   [facility] Birdie Sons, MD   1 year ago Essential (primary) hypertension   [facility] Birdie Sons, MD   1 year ago Encounter for [employer] annual wellness exam   [facility] Birdie Sons, MD   2 years ago Encounter for [employer] annual wellness exam   [facility] Birdie Sons, MD   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      Failed - ALT in normal range and within 360 days    ALT  Date Value Ref Range Status  07/09/2019 19 0 - 44 IU/L Final          Failed - AST in normal range and within 360 days    AST  Date Value Ref Range Status  07/09/2019 21 0 - 40 IU/L Final          Failed - HCT in normal range and within 180 days    HCT  Date Value Ref Range Status  09/27/2019 40.1 39.0 - 52.0 % Final   Hematocrit  Date Value Ref Range Status  12/11/2018 44.0 37.5 - 51.0 % Final          Failed - HGB in normal range and within 180 days    Hemoglobin  Date  Value Ref Range Status  09/27/2019 13.9 13.0 - 17.0 g/dL Final  12/11/2018 14.9 13.0 - 17.7 g/dL Final          Failed - PLT in normal range and within 180 days    Platelets  Date Value Ref Range Status  09/27/2019 183 150 - 400 K/uL Final  12/11/2018 205 150 - 450 x10E3/uL Final          Failed - Valid encounter within last 6 months    Recent Outpatient Visits           7 months ago Prostate cancer screening   [facility] Birdie Sons, MD   1 year ago Essential (primary) hypertension   [facility] Birdie Sons, MD   1 year ago Encounter for Medicare annual wellness exam   [facility] Birdie Sons, MD   2 years ago Encounter for [employer] annual wellness exam   [facility] Birdie Sons, MD   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

## 2020-09-14 NOTE — Progress Notes (Signed)
[facility] Note  09/15/2020     CHIEF COMPLAINT Patient presents for Retina Follow Up   HISTORY OF PRESENT ILLNESS: Garrett Green is a 75 y.o. male who presents to the clinic today for:  HPI    Retina Follow Up    Patient presents with  Other.  In left eye.  Duration of 7 weeks.  Since onset it is stable.  I, the attending physician,  performed the HPI with the patient and updated documentation appropriately.          Comments    7 week follow up CME OS- Vision appears stable OU.  Using Prolensa and Prednisolone qd, at times pt states he uses them BID if eye feels irritated OS.  Using Timolol qd OS and Brimonidine BID OS.        Last edited by Bernarda Caffey, MD on 09/15/2020  9:01 AM. (History)    pt states vision has not changes, he is using PF and Prolensa Qdaily, will occasionally use BID if eyes feel irritated  Referring physician: Birdie Sons, MD [address] [place],  [place] [address]  HISTORICAL INFORMATION:   Selected notes from the MEDICAL RECORD NUMBER    CURRENT MEDICATIONS: Current Outpatient Medications (Ophthalmic Drugs)  Medication Sig  . brimonidine (ALPHAGAN P) 0.1 % SOLN Place 1 drop into the left eye 2 (two) times daily.  . timolol (TIMOPTIC) 0.5 % ophthalmic solution INSTILL ONE DROP TO THE AFFECTED EYE TWICE DAILY  . Bromfenac Sodium (PROLENSA) 0.07 % SOLN Place 1 drop into the left eye daily.  Marland Kitchen latanoprost (XALATAN) 0.005 % ophthalmic solution Place 1 drop into both eyes at bedtime.  . prednisoLONE acetate (PRED FORTE) 1 % ophthalmic suspension Place 1 drop into the left eye daily.   No current facility-administered medications for this visit. (Ophthalmic Drugs)   Current Outpatient Medications (Other)  Medication Sig  . aspirin 325 MG tablet Take 325 mg by mouth daily.  Marland Kitchen atorvastatin (LIPITOR) 40 MG tablet Take 1 tablet (40 mg total) by mouth daily.  . benzonatate (TESSALON) 200 MG capsule Take by  mouth.  . clopidogrel (PLAVIX) 75 MG tablet TAKE 1 TABLET BY MOUTH EVERY DAY  . fluticasone (FLONASE) 50 MCG/ACT nasal spray Place 2 sprays into both nostrils daily as needed for allergies.  Marland Kitchen HYDROcodone-acetaminophen (NORCO/VICODIN) 5-325 MG tablet Take 1 tablet by mouth every 6 (six) hours as needed.  . loratadine (CLARITIN) 10 MG tablet Take 10 mg by mouth daily as needed for allergies.  Marland Kitchen LORazepam (ATIVAN) 1 MG tablet Take 1 tablet (1 mg total) by mouth 3 (three) times daily as needed. (Patient taking differently: Take 1 mg by mouth as directed. Only for flying)  . ramipril (ALTACE) 10 MG capsule TAKE 1 CAPSULE BY MOUTH  DAILY  . zolpidem (AMBIEN) 10 MG tablet Take 0.5-1 tablets (5-10 mg total) by mouth at bedtime as needed for sleep.   No current facility-administered medications for this visit. (Other)      REVIEW OF SYSTEMS: ROS    Positive for: Gastrointestinal, Neurological, Musculoskeletal, Cardiovascular, Eyes   Negative for: Constitutional, Skin, Genitourinary, HENT, Endocrine, Respiratory, Psychiatric, Allergic/Imm, Heme/Lymph   Last edited by Leonie Douglas, COA on 09/15/2020  8:21 AM. (History)       ALLERGIES Allergies  Allergen Reactions  . Sulfa Antibiotics Other (See Comments)    Lethargic and hypotension    PAST MEDICAL HISTORY Past Medical History:  Diagnosis Date  .  COVID-19 07/2019  . History of blood transfusion 2014   "related to back OR"  . History of chicken pox   . Hypertensive retinopathy    OU  . Retinal detachment    OS  . Stroke ([place])   . TIA (transient ischemic attack) 10/17/2017; 10/19/2017   Past Surgical History:  Procedure Laterality Date  . BACK SURGERY    . CATARACT EXTRACTION Right 05/2019   Dr. Talbert Forest  . CATARACT EXTRACTION Left 2017  . CATARACT EXTRACTION W/ INTRAOCULAR LENS IMPLANT Left   . COLONOSCOPY WITH PROPOFOL N/A 03/16/2016   Procedure: COLONOSCOPY WITH PROPOFOL;  Surgeon: Manya Silvas, MD;  Location: [facility] ENDOSCOPY;   Service: Endoscopy;  Laterality: N/A;  . ESOPHAGOGASTRODUODENOSCOPY (EGD) WITH ESOPHAGEAL DILATION  1990s  . EYE SURGERY Bilateral    Cat Sx  . IR ANGIO INTRA EXTRACRAN SEL COM CAROTID INNOMINATE BILAT MOD SED  10/31/2017  . IR ANGIO INTRA EXTRACRAN SEL COM CAROTID INNOMINATE BILAT MOD SED  01/15/2019  . IR ANGIO INTRA EXTRACRAN SEL COM CAROTID INNOMINATE BILAT MOD SED  09/27/2019  . IR ANGIO VERTEBRAL SEL VERTEBRAL BILAT MOD SED  10/31/2017  . IR ANGIO VERTEBRAL SEL VERTEBRAL BILAT MOD SED  01/15/2019  . IR ANGIO VERTEBRAL SEL VERTEBRAL BILAT MOD SED  09/27/2019  . IR US GUIDE VASC ACCESS RIGHT  01/15/2019  . IR US GUIDE VASC ACCESS RIGHT  09/27/2019  . LUMBAR LAMINECTOMY/DECOMPRESSION MICRODISCECTOMY Left 07/26/2012   Procedure: LUMBAR LAMINECTOMY/DECOMPRESSION MICRODISCECTOMY 1 LEVEL;  Surgeon: Eustace Moore, MD;  Location: [facility];  Service: Neurosurgery;  Laterality: Left;  lumbar five sacral one  . RETINAL DETACHMENT SURGERY Left 2016   Dr. Posey Pronto  . SHOULDER ARTHROSCOPY WITH OPEN ROTATOR CUFF REPAIR Left 1990s  . TONSILLECTOMY    . YAG LASER APPLICATION Left 01/1750   Dr. Talbert Forest    FAMILY HISTORY Family History  Problem Relation Age of Onset  . Heart disease Mother   . Cancer Sister   . Dementia Brother   . Dementia Sister   . Diabetes Brother   . Diabetes Sister   . Colon cancer Neg Hx   . Prostate cancer Neg Hx     SOCIAL HISTORY Social History   Tobacco Use  . Smoking status: Former Smoker    Packs/day: 1.00    Years: 25.00    Pack years: 25.00    Quit date: 03/03/1988    Years since quitting: 32.5  . Smokeless tobacco: Never Used  Vaping Use  . Vaping Use: Never used  Substance Use Topics  . Alcohol use: Yes    Alcohol/week: 1.0 standard drink    Types: 1 Standard drinks or equivalent per week  . Drug use: Never         OPHTHALMIC EXAM:  Base Eye Exam    Visual Acuity (Snellen - Linear)      Right Left   Dist [place] 20/20 20/50-   Dist ph [place]  20/50 +2        Tonometry (Tonopen, 8:26 AM)      Right Left   Pressure 15 12       Pupils      Dark Light Shape React APD   Right 3 2 Round Brisk None   Left 2 1 Round Slow None       Visual Fields (Counting fingers)      Left Right    Full Full       Extraocular Movement  Right Left    Full Full       Neuro/Psych    Oriented x3: Yes   Mood/Affect: Normal       Dilation    Both eyes: 1.0% Mydriacyl, 2.5% Phenylephrine @ 8:26 AM        Slit Lamp and Fundus Exam    Slit Lamp Exam      Right Left   Lids/Lashes Dermatochalasis - upper lid Dermatochalasis - upper lid   Conjunctiva/Sclera Mild nasal Pinguecula 1+ Injection, prominent limbal vessels nasally   Cornea Mild arcus, well healed temporal cataract wounds, 1+ Punctate epithelial erosions, 2-3+Debris in tear film Mild arcus, well healed temporal cataract wounds, 2+ central Punctate epithelial erosions, 1+Debris in tear film, decreased TBUT, trace endo pigment inferiorly   Anterior Chamber Deep and quiet Deep, 0.5+cell/pigment   Iris Round and dilated mild iridodonesis, Round and poorly dilated to 3.75   Lens PC IOL in good position PC IOL with fine pigment depositon, open PC   Vitreous Vitreous syneresis, Posterior vitreous detachment, vitreous condensations Vitreous syneresis       Fundus Exam      Right Left   Disc Pink and Sharp Pink and Sharp, +PPA   C/D Ratio 0.6 0.6   Macula Flat, Blunted foveal reflex, ERM, mild RPE mottling and clumping, No heme or edema Blunted foveal reflex, central CME - stably improved, +ERM, RPE mottling and clumping, no heme   Vessels Attenuated, tortuous, mild AV crossing changes, mild Copper wiring Attenuated, mild tortuousity   Periphery Attached    limited view due to poor dilation, attached, no heme, inferior paving stone degeneration / CR atrophy          IMAGING AND PROCEDURES  Imaging and Procedures for 09/15/2020  OCT, Retina - OU - Both Eyes       Right Eye Quality  was good. Central Foveal Thickness: 331. Progression has been stable. Findings include normal foveal contour, no IRF, no SRF.   Left Eye Quality was good. Central Foveal Thickness: 360. Progression has been stable. Findings include abnormal foveal contour, epiretinal membrane, no SRF, no IRF (stable improvement in central IRF).   Notes *Images captured and stored on drive  Diagnosis / Impression:  OD: NFP, no IRF/SRF OS: stable improvement in central IRF; +ERM  Clinical management:  See below  Abbreviations: NFP - Normal foveal profile. CME - cystoid macular edema. PED - pigment epithelial detachment. IRF - intraretinal fluid. SRF - subretinal fluid. EZ - ellipsoid zone. ERM - epiretinal membrane. ORA - outer retinal atrophy. ORT - outer retinal tubulation. SRHM - subretinal hyper-reflective material. IRHM - intraretinal hyper-reflective material                 ASSESSMENT/PLAN:    ICD-10-CM   1. Cystoid macular edema of left eye  H35.352   2. Retinal edema  H35.81 OCT, Retina - OU - Both Eyes  3. History of retinal detachment  Z86.69   4. Posterior vitreous detachment of right eye  H43.811   5. Essential hypertension  I10   6. Hypertensive retinopathy of both eyes  H35.033   7. Pseudophakia, both eyes  Z96.1   8. Primary open angle glaucoma of both eyes, unspecified glaucoma stage  H40.1130   9. Dry eyes  H04.123     1,2. CME OS  - pt with history of complicated CEIOL OS w/ RD in 2016  - history of CME since 2016 previously managed by Dr. Armanda Heritage   -  was receiving intravitreal injections per pt report -- last visit w/ Dr. Posey Pronto in January 2021  - started on PF and Prolensa QID OS here on 09.02.21  - stopped latanoprost on 12.14.21  - BCVA OS stable at 20/50  - OCT shows stable improvement in IRF/SRF and foveal profile OS -- limited by ERM  - history of glaucoma and currently on timolol qdaily OU, Alphagan P BID OU, and Latanoprost qhs OU  - continue PF and  Prolensa Qdaily  - discussed possible need for low dose, long-term maintenance PF/Prolensa, given history of recurrent CME  - f/u 4-6 months, DFE, OCT  3. History of RD OS  - 2016 after cataract surgery Manuella Ghazi)  - s/p PPV w/ Dr. Posey Pronto in 2016  - retina attached; appears stable  - monitor  4. PVD / vitreous syneresis OD+  - symptomatic floaters OD -- improved  - Discussed findings and prognosis  - No RT or RD on 360 peripheral exam  - Reviewed s/s of RT/RD  - Strict return precautions for any such RT/RD signs/symptoms  - f/u in 6 wks -- DFE/OCT  5,6. Hypertensive retinopathy OU - discussed importance of tight BP control - monitor  7. Pseudophakia OU  - s/p CE/IOL (OD: Dr. Talbert Forest, OS: Dr. Ruthell Rummage, 2016)  - s/p YAG OS -- [place], May 2021  - OS with iridonesis and poor dilation - monitor  8. POAG OU  - IOP 15,12 today  - currently on timolol qdaily OU, Alphagan P BID OU  - pt has stopped latan OU as above  - pt reports irritation w/ timolol  9. Dry eyes OU - recommend artificial tears and lubricating ointment as needed    Ophthalmic Meds Ordered this visit:  Meds ordered this encounter  Medications  . Bromfenac Sodium (PROLENSA) 0.07 % SOLN    Sig: Place 1 drop into the left eye daily.    Dispense:  6 mL    Refill:  10  . prednisoLONE acetate (PRED FORTE) 1 % ophthalmic suspension    Sig: Place 1 drop into the left eye daily.    Dispense:  10 mL    Refill:  10       Return for f/u 4-6 months, CME OS, DFE, OCT.  There are no Patient Instructions on file for this visit.   This document serves as a record of services personally performed by Gardiner Sleeper, MD, PhD. It was created on their behalf by Estill Bakes, COT an ophthalmic technician. The creation of this record is the provider's dictation and/or activities during the visit.    Electronically signed by: Estill Bakes, COT 6.6.22 @ 11:03 PM   This document serves as a record of services personally performed  by Gardiner Sleeper, MD, PhD. It was created on their behalf by San Jetty. Owens Shark, OA an ophthalmic technician. The creation of this record is the provider's dictation and/or activities during the visit.    Electronically signed by: San Jetty. Owens Shark, [place] 06.07.2022 11:03 PM   Gardiner Sleeper, M.D., Ph.D. Diseases & Surgery of the Retina and Vitreous [facility]  I have reviewed the above documentation for accuracy and completeness, and I agree with the above. Gardiner Sleeper, M.D., Ph.D. 09/15/20 11:03 PM   Abbreviations: M myopia (nearsighted); A astigmatism; H hyperopia (farsighted); P presbyopia; Mrx spectacle prescription;  CTL contact lenses; OD right eye; OS left eye; OU both eyes  XT exotropia; ET esotropia; PEK punctate  epithelial keratitis; PEE punctate epithelial erosions; DES dry eye syndrome; MGD meibomian gland dysfunction; ATs artificial tears; PFAT's preservative free artificial tears; [place] nuclear sclerotic cataract; PSC posterior subcapsular cataract; ERM epi-retinal membrane; PVD posterior vitreous detachment; RD retinal detachment; DM diabetes mellitus; DR diabetic retinopathy; NPDR non-proliferative diabetic retinopathy; PDR proliferative diabetic retinopathy; CSME clinically significant macular edema; DME diabetic macular edema; dbh dot blot hemorrhages; CWS cotton wool spot; POAG primary open angle glaucoma; C/D cup-to-disc ratio; HVF humphrey visual field; GVF goldmann visual field; OCT optical coherence tomography; IOP intraocular pressure; BRVO Branch retinal vein occlusion; CRVO central retinal vein occlusion; CRAO central retinal artery occlusion; BRAO branch retinal artery occlusion; RT retinal tear; SB scleral buckle; PPV pars plana vitrectomy; VH Vitreous hemorrhage; PRP panretinal laser photocoagulation; IVK intravitreal kenalog; VMT vitreomacular traction; MH Macular hole;  NVD neovascularization of the disc; NVE neovascularization elsewhere; AREDS age  related eye disease study; ARMD age related macular degeneration; POAG primary open angle glaucoma; EBMD epithelial/anterior basement membrane dystrophy; ACIOL anterior chamber intraocular lens; IOL intraocular lens; PCIOL posterior chamber intraocular lens; Phaco/IOL phacoemulsification with intraocular lens placement; [place] photorefractive keratectomy; LASIK laser assisted in situ keratomileusis; HTN hypertension; DM diabetes mellitus; COPD chronic obstructive pulmonary disease

## 2020-09-15 ENCOUNTER — Other Ambulatory Visit: Payer: Self-pay

## 2020-09-15 ENCOUNTER — Encounter (INDEPENDENT_AMBULATORY_CARE_PROVIDER_SITE_OTHER): Payer: Self-pay | Admitting: Ophthalmology

## 2020-09-15 ENCOUNTER — Ambulatory Visit (INDEPENDENT_AMBULATORY_CARE_PROVIDER_SITE_OTHER): Payer: Medicare Other | Admitting: Ophthalmology

## 2020-09-15 DIAGNOSIS — I10 Essential (primary) hypertension: Secondary | ICD-10-CM

## 2020-09-15 DIAGNOSIS — Z8669 Personal history of other diseases of the nervous system and sense organs: Secondary | ICD-10-CM | POA: Diagnosis not present

## 2020-09-15 DIAGNOSIS — H35033 Hypertensive retinopathy, bilateral: Secondary | ICD-10-CM

## 2020-09-15 DIAGNOSIS — H43811 Vitreous degeneration, right eye: Secondary | ICD-10-CM | POA: Diagnosis not present

## 2020-09-15 DIAGNOSIS — H3581 Retinal edema: Secondary | ICD-10-CM

## 2020-09-15 DIAGNOSIS — H401130 Primary open-angle glaucoma, bilateral, stage unspecified: Secondary | ICD-10-CM

## 2020-09-15 DIAGNOSIS — H04123 Dry eye syndrome of bilateral lacrimal glands: Secondary | ICD-10-CM

## 2020-09-15 DIAGNOSIS — Z961 Presence of intraocular lens: Secondary | ICD-10-CM

## 2020-09-15 DIAGNOSIS — H35352 Cystoid macular degeneration, left eye: Secondary | ICD-10-CM | POA: Diagnosis not present

## 2020-09-15 MED ORDER — PREDNISOLONE ACETATE 1 % OP SUSP: 1.0000 [drp] | Freq: Every day | OPHTHALMIC | 10 refills | Status: DC

## 2020-09-15 MED ORDER — PROLENSA 0.07 % OP SOLN: 1.0000 [drp] | Freq: Every day | OPHTHALMIC | 10 refills | Status: DC

## 2020-10-02 ENCOUNTER — Encounter (INDEPENDENT_AMBULATORY_CARE_PROVIDER_SITE_OTHER): Payer: Self-pay | Admitting: Ophthalmology

## 2020-10-05 ENCOUNTER — Other Ambulatory Visit (INDEPENDENT_AMBULATORY_CARE_PROVIDER_SITE_OTHER): Payer: Self-pay | Admitting: Ophthalmology

## 2020-10-05 MED ORDER — TIMOLOL MALEATE 0.5 % OP SOLN: 1.0000 [drp] | Freq: Every day | OPHTHALMIC | 10 refills | Status: DC

## 2020-10-13 ENCOUNTER — Other Ambulatory Visit: Payer: Self-pay | Admitting: Family Medicine

## 2020-10-13 DIAGNOSIS — M47816 Spondylosis without myelopathy or radiculopathy, lumbar region: Secondary | ICD-10-CM

## 2020-10-13 MED ORDER — HYDROCODONE-ACETAMINOPHEN 5-325 MG PO TABS
1.0000 | ORAL_TABLET | Freq: Four times a day (QID) | ORAL | 0 refills | Status: DC | PRN
Start: 2020-10-13 — End: 2021-01-28

## 2020-11-10 ENCOUNTER — Other Ambulatory Visit (HOSPITAL_COMMUNITY): Payer: Self-pay | Admitting: Interventional Radiology

## 2020-11-10 DIAGNOSIS — I771 Stricture of artery: Secondary | ICD-10-CM

## 2020-12-09 ENCOUNTER — Ambulatory Visit (HOSPITAL_COMMUNITY)
Admission: RE | Admit: 2020-12-09 | Discharge: 2020-12-09 | Disposition: A | Discharge status: Home / Self Care | Payer: Medicare Other | Source: Ambulatory Visit | Attending: Interventional Radiology | Admitting: Interventional Radiology

## 2020-12-09 ENCOUNTER — Ambulatory Visit (HOSPITAL_COMMUNITY): Payer: Medicare Other

## 2020-12-09 ENCOUNTER — Encounter (HOSPITAL_COMMUNITY): Payer: Self-pay

## 2020-12-09 ENCOUNTER — Other Ambulatory Visit: Payer: Self-pay

## 2020-12-09 DIAGNOSIS — I771 Stricture of artery: Secondary | ICD-10-CM | POA: Diagnosis present

## 2020-12-09 DIAGNOSIS — I6782 Cerebral ischemia: Secondary | ICD-10-CM | POA: Diagnosis not present

## 2020-12-09 DIAGNOSIS — Z8673 Personal history of transient ischemic attack (TIA), and cerebral infarction without residual deficits: Secondary | ICD-10-CM | POA: Diagnosis not present

## 2020-12-15 ENCOUNTER — Telehealth (HOSPITAL_COMMUNITY): Payer: Self-pay

## 2020-12-15 NOTE — Telephone Encounter (Signed)
Pt agreed to f/u in 6 months with [facility]. AW

## 2020-12-23 ENCOUNTER — Other Ambulatory Visit: Payer: Self-pay

## 2020-12-23 ENCOUNTER — Ambulatory Visit (INDEPENDENT_AMBULATORY_CARE_PROVIDER_SITE_OTHER): Payer: Medicare Other | Admitting: Family Medicine

## 2020-12-23 ENCOUNTER — Encounter: Payer: Self-pay | Admitting: Family Medicine

## 2020-12-23 VITALS — BP 138/72 | HR 64 | Temp 97.3°F | Resp 16 | Ht 68.0 in | Wt 184.0 lb

## 2020-12-23 DIAGNOSIS — I6990 Unspecified sequelae of unspecified cerebrovascular disease: Secondary | ICD-10-CM

## 2020-12-23 DIAGNOSIS — Z289 Immunization not carried out for unspecified reason: Secondary | ICD-10-CM | POA: Diagnosis not present

## 2020-12-23 DIAGNOSIS — Z23 Encounter for immunization: Secondary | ICD-10-CM | POA: Diagnosis not present

## 2020-12-23 DIAGNOSIS — Z0000 Encounter for general adult medical examination without abnormal findings: Secondary | ICD-10-CM | POA: Diagnosis not present

## 2020-12-23 DIAGNOSIS — R7303 Prediabetes: Secondary | ICD-10-CM

## 2020-12-23 DIAGNOSIS — M479 Spondylosis, unspecified: Secondary | ICD-10-CM

## 2020-12-23 DIAGNOSIS — Z125 Encounter for screening for malignant neoplasm of prostate: Secondary | ICD-10-CM

## 2020-12-23 DIAGNOSIS — G4700 Insomnia, unspecified: Secondary | ICD-10-CM

## 2020-12-23 DIAGNOSIS — I10 Essential (primary) hypertension: Secondary | ICD-10-CM

## 2020-12-23 DIAGNOSIS — E785 Hyperlipidemia, unspecified: Secondary | ICD-10-CM

## 2020-12-23 MED ORDER — ZOLPIDEM TARTRATE 10 MG PO TABS: 5.0000 mg | ORAL_TABLET | Freq: Every evening | ORAL | 1 refills | Status: DC | PRN

## 2020-12-23 MED ORDER — SHINGRIX 50 MCG/0.5ML IM SUSR: 0.5000 mL | Freq: Once | INTRAMUSCULAR | 0 refills | Status: AC

## 2020-12-23 NOTE — Patient Instructions (Signed)
.   Please review the attached list of medications and notify my office if there are any errors.   . Please bring all of your medications to every appointment so we can make sure that our medication list is the same as yours.   

## 2020-12-23 NOTE — Progress Notes (Addendum)
Complete Physical Exam       Patient: Garrett Green, Male    DOB: 12/26/1945, 75 y.o.   MRN: [id number] Visit Date: 12/23/2020  Today's Provider: Lelon Huh, MD    Subjective    Garrett Green is a 75 y.o. male who presents today for his complete physical examination He reports consuming a general diet. The patient does not participate in regular exercise at present. He generally feels fairly well. He reports sleeping poorly. He does not have additional problems to discuss today.   HPI Prediabetes, Follow-up  Lab Results  Component Value Date   HGBA1C 6.5 (H) 12/13/2019   HGBA1C 6.0 (H) 07/09/2019   HGBA1C 5.8 (H) 12/11/2018   GLUCOSE 128 (H) 09/27/2019   GLUCOSE 105 (H) 07/09/2019   GLUCOSE 119 (H) 01/15/2019    Last seen for for this1  year  ago.  Management since that visit includes advising patient to get more strict with diet and strictly avoid sweets and white starchy foods.. Current symptoms include none and have been stable.  Prior visit with dietician: no Current diet: well balanced Current exercise: none  Pertinent Labs:    Component Value Date/Time   CHOL 107 07/09/2019 0907   TRIG 70 07/09/2019 0907   CHOLHDL 2.1 07/09/2019 0907   CHOLHDL 4.2 10/18/2017 0555   CREATININE 1.05 09/27/2019 1019    Wt Readings from Last 3 Encounters:  12/23/20 184 lb (83.5 kg)  12/13/19 189 lb (85.7 kg)  09/27/19 185 lb (83.9 kg)    -----------------------------------------------------------------------------------------   Hypertension, follow-up  BP Readings from Last 3 Encounters:  12/23/20 (!) 142/72  12/13/19 132/70  09/27/19 (!) 143/61   Wt Readings from Last 3 Encounters:  12/23/20 184 lb (83.5 kg)  12/13/19 189 lb (85.7 kg)  09/27/19 185 lb (83.9 kg)     He was last seen for hypertension 1  year  ago.  BP at that visit was 132/70. Management since that visit includes continuing same medication  He reports poor compliance with  treatment. He is not having side effects.  He is following a Regular diet. He is not exercising. He does not smoke.  Use of agents associated with hypertension: NSAIDS.   Outside blood pressures are 130/ 70. Symptoms: No chest pain No chest pressure  No palpitations No syncope  No dyspnea No orthopnea  No paroxysmal nocturnal dyspnea No lower extremity edema   Pertinent labs: Lab Results  Component Value Date   NA 139 09/27/2019   K 5.0 09/27/2019   CREATININE 1.05 09/27/2019   GFRNONAA >60 09/27/2019   GFRAA >60 09/27/2019   GLUCOSE 128 (H) 09/27/2019     The ASCVD Risk score (Arnett DK, et al., 2019) failed to calculate for the following reasons:   The patient has a prior MI or stroke diagnosis   ---------------------------------------------------------------------------------------------------   Lipid/Cholesterol, Follow-up  Last lipid panel Other pertinent labs  Lab Results  Component Value Date   CHOL 107 07/09/2019   HDL 52 07/09/2019   LDLCALC 40 07/09/2019   TRIG 70 07/09/2019   CHOLHDL 2.1 07/09/2019   Lab Results  Component Value Date   ALT 19 07/09/2019   AST 21 07/09/2019   PLT 183 09/27/2019     He was last seen for this 1  year  ago.  Management since that visit includes continue same medication.  He reports good compliance with treatment. He is not having side effects.   Symptoms: No chest pain  No chest pressure/discomfort  No dyspnea No lower extremity edema  No numbness or tingling of extremity No orthopnea  No palpitations No paroxysmal nocturnal dyspnea  No speech difficulty No syncope   Current diet: well balanced Current exercise: none  The ASCVD Risk score (Arnett DK, et al., 2019) failed to calculate for the following reasons:   The patient has a prior MI or stroke diagnosis  ---------------------------------------------------------------------------------------------------   Follow up for insomnia:  The patient was last  seen for this 1  year  ago. Changes made at last visit include none; refill provided for Ambien.  He reports good compliance with treatment. He feels that condition is Unchanged. He is not having side effects.   -----------------------------------------------------------------------------------------    Medications: Outpatient Medications Prior to Visit  Medication Sig   aspirin 325 MG tablet Take 325 mg by mouth daily.   atorvastatin (LIPITOR) 40 MG tablet Take 1 tablet (40 mg total) by mouth daily.   benzonatate (TESSALON) 200 MG capsule Take by mouth.   brimonidine (ALPHAGAN P) 0.1 % SOLN Place 1 drop into the left eye 2 (two) times daily.   Bromfenac Sodium (PROLENSA) 0.07 % SOLN Place 1 drop into the left eye daily.   clopidogrel (PLAVIX) 75 MG tablet TAKE 1 TABLET BY MOUTH EVERY DAY   fluticasone (FLONASE) 50 MCG/ACT nasal spray Place 2 sprays into both nostrils daily as needed for allergies.   HYDROcodone-acetaminophen (NORCO/VICODIN) 5-325 MG tablet Take 1 tablet by mouth every 6 (six) hours as needed.   loratadine (CLARITIN) 10 MG tablet Take 10 mg by mouth daily as needed for allergies.   LORazepam (ATIVAN) 1 MG tablet Take 1 tablet (1 mg total) by mouth 3 (three) times daily as needed. (Patient taking differently: Take 1 mg by mouth as directed. Only for flying)   prednisoLONE acetate (PRED FORTE) 1 % ophthalmic suspension Place 1 drop into the left eye daily.   ramipril (ALTACE) 10 MG capsule TAKE 1 CAPSULE BY MOUTH  DAILY   zolpidem (AMBIEN) 10 MG tablet Take 0.5-1 tablets (5-10 mg total) by mouth at bedtime as needed for sleep.   [DISCONTINUED] latanoprost (XALATAN) 0.005 % ophthalmic solution Place 1 drop into both eyes at bedtime.   [DISCONTINUED] timolol (TIMOPTIC) 0.5 % ophthalmic solution Place 1 drop into both eyes daily.   No facility-administered medications prior to visit.    Allergies  Allergen Reactions   Sulfa Antibiotics Other (See Comments)    Lethargic  and hypotension    Patient Care Team: Birdie Sons, MD as PCP - General (Family Medicine) Oh, Lupita Dawn, MD (Inactive) as Consulting Physician (Gastroenterology) Jalene Mullet, MD as Consulting Physician (Ophthalmology) Garvin Fila, MD as Consulting Physician (Neurology) Frann Rider, NP as Nurse Practitioner (Neurology) Luanne Bras, MD as Consulting Physician (Interventional Radiology) Brendolyn Patty, MD (Dermatology)  Review of Systems  Constitutional:  Negative for appetite change, chills, fatigue and fever.  HENT:  Negative for congestion, ear pain, hearing loss, nosebleeds and trouble swallowing.   Eyes:  Negative for pain and visual disturbance.  Respiratory:  Negative for cough, chest tightness and shortness of breath.   Cardiovascular:  Negative for chest pain, palpitations and leg swelling.  Gastrointestinal:  Negative for abdominal pain, blood in stool, constipation, diarrhea, nausea and vomiting.  Endocrine: Negative for polydipsia, polyphagia and polyuria.  Genitourinary:  Negative for dysuria and flank pain.  Musculoskeletal:  Negative for arthralgias, back pain, joint swelling, myalgias and neck stiffness.  Skin:  Negative for color  change, rash and wound.  Neurological:  Negative for dizziness, tremors, seizures, speech difficulty, weakness, light-headedness and headaches.  Psychiatric/Behavioral:  Negative for behavioral problems, confusion, decreased concentration, dysphoric mood and sleep disturbance. The patient is not nervous/anxious.   All other systems reviewed and are negative.      Objective    Vitals: BP (!) 142/72 (BP Location: Left Arm, Patient Position: Sitting, Cuff Size: Large)   Pulse 64   Temp (!) 97.3 F (36.3 C) (Temporal)   Resp 16   Ht '5\' 8"'$  (1.727 m)   Wt 184 lb (83.5 kg)   BMI 27.98 kg/m    Today's Vitals   12/23/20 0851 12/23/20 0901 12/23/20 0904  BP: (!) 166/80 (!) 167/83 (!) 142/72  Pulse: 64    Resp: 16    Temp:  (!) 97.3 F (36.3 C)    TempSrc: Temporal    Weight: 184 lb (83.5 kg)    Height: '5\' 8"'$  (1.727 m)     Body mass index is 27.98 kg/m.   Physical Exam   General Appearance:     Well developed, well nourished male. Alert, cooperative, in no acute distress, appears stated age  Head:    Normocephalic, without obvious abnormality, atraumatic  Eyes:    PERRL, conjunctiva/corneas clear, EOM's intact, fundi    benign, both eyes       Ears:    Normal TM's and external ear canals, both ears  Neck:   Supple, symmetrical, trachea midline, no adenopathy;       thyroid:  No enlargement/tenderness/nodules; no carotid   bruit or JVD  Back:     Symmetric, no curvature, ROM normal, no CVA tenderness  Lungs:     Clear to auscultation bilaterally, respirations unlabored  Chest wall:    No tenderness or deformity  Heart:    Normal heart rate. Normal rhythm. No murmurs, rubs, or gallops.  S1 and S2 normal  Abdomen:     Soft, non-tender, bowel sounds active all four quadrants,    no masses, no organomegaly  Genitalia:    deferred  Rectal:    deferred  Extremities:   All extremities are intact. No cyanosis or edema  Pulses:   2+ and symmetric all extremities  Skin:   Skin color, texture, turgor normal, no rashes or lesions  Lymph nodes:   Cervical, supraclavicular, and axillary nodes normal  Neurologic:   CNII-XII intact. Normal strength, sensation and reflexes      throughout    Assessment & Plan     1. Annual physical exam   2. Prostate cancer screening  - PSA Total (Reflex To Free) ([facility] only)  3. Prescription for Shingrix. Vaccine not administered in office.   - Zoster Vaccine Adjuvanted [facility]) injection; Inject 0.5 mLs into the muscle once for 1 dose.  Dispense: 0.5 mL; Refill: 0 #1  4. Prediabetes  - Hemoglobin A1c  5. Hyperlipidemia, unspecified hyperlipidemia type He is tolerating atorvastatin well with no adverse effects.   - CBC - Comprehensive metabolic panel - Lipid  panel  6. 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   8. Need for influenza vaccination  He declined flu vaccine today.   9. Essential (primary) hypertension Well controlled.  Continue current medications.   Continue current medications.    10. Insomnia, unspecified type Refill  zolpidem (AMBIEN) 10 MG tablet; Take 0.5-1 tablets (5-10 mg total) by mouth at bedtime as needed for  sleep.  Dispense: 90 tablet; Refill: 1    Discussed health benefits of physical activity, and encouraged him to engage in regular exercise appropriate for his age and condition.    Future Appointments  Date Time Provider [place]  03/23/2021  8:30 AM Bernarda Caffey, MD [facility] None  03/25/2021 11:15 AM Alfonso Patten, MD [facility] None  06/23/2021  8:00 AM Caryn Section Kirstie Peri, MD [facility]        The entirety of the information documented in the History of Present Illness, Review of Systems and Physical Exam were personally obtained by me. Portions of this information were initially documented by the CMA and reviewed by me for thoroughness and accuracy.     Lelon Huh, MD  [facility] [phone] (phone) [phone] (fax)  [place]

## 2020-12-23 NOTE — Progress Notes (Signed)
Annual Wellness Visit     Patient: Garrett Green, Male    DOB: 03/20/1946, 75 y.o.   MRN: [id number] Visit Date: 12/23/2020  Today's Provider: Lelon Huh, MD    Subjective    Garrett Green is a 75 y.o. male who presents today for his Annual Wellness Visit.    Medications: Outpatient Medications Prior to Visit  Medication Sig   aspirin 325 MG tablet Take 325 mg by mouth daily.   atorvastatin (LIPITOR) 40 MG tablet Take 1 tablet (40 mg total) by mouth daily.   brimonidine (ALPHAGAN P) 0.1 % SOLN Place 1 drop into the left eye 2 (two) times daily.   Bromfenac Sodium (PROLENSA) 0.07 % SOLN Place 1 drop into the left eye daily.   clopidogrel (PLAVIX) 75 MG tablet TAKE 1 TABLET BY MOUTH EVERY DAY   fluticasone (FLONASE) 50 MCG/ACT nasal spray Place 2 sprays into both nostrils daily as needed for allergies.   HYDROcodone-acetaminophen (NORCO/VICODIN) 5-325 MG tablet Take 1 tablet by mouth every 6 (six) hours as needed.   loratadine (CLARITIN) 10 MG tablet Take 10 mg by mouth daily as needed for allergies.   LORazepam (ATIVAN) 1 MG tablet Take 1 tablet (1 mg total) by mouth 3 (three) times daily as needed. (Patient taking differently: Take 1 mg by mouth as directed. Only for flying)   prednisoLONE acetate (PRED FORTE) 1 % ophthalmic suspension Place 1 drop into the left eye daily.   ramipril (ALTACE) 10 MG capsule TAKE 1 CAPSULE BY MOUTH  DAILY   [DISCONTINUED] benzonatate (TESSALON) 200 MG capsule Take by mouth.   [DISCONTINUED] zolpidem (AMBIEN) 10 MG tablet Take 0.5-1 tablets (5-10 mg total) by mouth at bedtime as needed for sleep.   [DISCONTINUED] latanoprost (XALATAN) 0.005 % ophthalmic solution Place 1 drop into both eyes at bedtime.   [DISCONTINUED] timolol (TIMOPTIC) 0.5 % ophthalmic solution Place 1 drop into both eyes daily.   No facility-administered medications prior to visit.    Allergies  Allergen Reactions   Sulfa Antibiotics Other (See Comments)     Lethargic and hypotension    Patient Care Team: Birdie Sons, MD as PCP - General (Family Medicine) Oh, Lupita Dawn, MD (Inactive) as Consulting Physician (Gastroenterology) Jalene Mullet, MD as Consulting Physician (Ophthalmology) Garvin Fila, MD as Consulting Physician (Neurology) Frann Rider, NP as Nurse Practitioner (Neurology) Luanne Bras, MD as Consulting Physician (Interventional Radiology) Brendolyn Patty, MD (Dermatology)    Objective     Most recent functional status assessment: In your present state of health, do you have any difficulty performing the following activities: 12/23/2020  Hearing? N  Vision? N  Difficulty concentrating or making decisions? N  Walking or climbing stairs? N  Dressing or bathing? N  Doing errands, shopping? N  Some recent data might be hidden   Most recent fall risk assessment: Fall Risk  12/23/2020  Falls in the past year? 0  Number falls in past yr: 0  Injury with Fall? 0  Risk for fall due to : No Fall Risks  Follow up Falls evaluation completed    Most recent depression screenings: PHQ 2/9 Scores 12/23/2020 12/13/2019  PHQ - 2 Score 0 0  PHQ- 9 Score 3 3   Most recent cognitive screening: No flowsheet data found. Most recent Audit-C alcohol use screening Alcohol Use Disorder Test (AUDIT) 12/23/2020  1. How often do you have a drink containing alcohol? 2  2. How many drinks containing alcohol do you  have on a typical day when you are drinking? 0  3. How often do you have six or more drinks on one occasion? 0  AUDIT-C Score 2  Alcohol Brief Interventions/Follow-up -   A score of 3 or more in women, and 4 or more in men indicates increased risk for alcohol abuse, EXCEPT if all of the points are from question 1   No results found for any visits on 12/23/20.  Assessment & Plan     Annual wellness visit done today including the all of the following: Reviewed patient's Family Medical History Reviewed and updated list  of patient's medical providers Assessment of cognitive impairment was done Assessed patient's functional ability Established a written schedule for health screening [place] Completed and Reviewed  Exercise Activities and Dietary recommendations  Goals   None     Immunization History  Administered Date(s) Administered   Fluad Quad(high Dose 65+) 12/11/2018, 04/08/2020   Influenza, High Dose Seasonal PF 11/30/2016, 02/12/2018   Pneumococcal Conjugate-13 10/23/2013   Pneumococcal Polysaccharide-23 10/27/2014   Td 11/26/2015   Tdap 06/20/2005   Zoster, Live 09/02/2009    Health Maintenance  Topic Date Due   COVID-19 Vaccine (1) Never done   Zoster Vaccines- Shingrix (1 of 2) Never done   INFLUENZA VACCINE  07/09/2021 (Originally 11/09/2020)   TETANUS/TDAP  11/25/2025   COLONOSCOPY (Pts 45-56yr Insurance coverage will need to be confirmed)  03/16/2026   Hepatitis C Screening  Completed   PNA vac Low Risk Adult  Completed   HPV VACCINES  Aged Out     Discussed health benefits of physical activity, and encouraged him to engage in regular exercise appropriate for his age and condition.            DLelon Huh MD  [phone](phone) [phone](fax)  [place]

## 2020-12-24 LAB — COMPREHENSIVE METABOLIC PANEL
ALT: 23 IU/L (ref 0–44)
AST: 24 IU/L (ref 0–40)
Albumin/Globulin Ratio: 2.3 — ABNORMAL HIGH (ref 1.2–2.2)
Albumin: 4.5 g/dL (ref 3.7–4.7)
Alkaline Phosphatase: 83 IU/L (ref 44–121)
BUN/Creatinine Ratio: 12 (ref 10–24)
BUN: 13 mg/dL (ref 8–27)
Bilirubin Total: 0.6 mg/dL (ref 0.0–1.2)
CO2: 21 mmol/L (ref 20–29)
Calcium: 9.4 mg/dL (ref 8.6–10.2)
Chloride: 105 mmol/L (ref 96–106)
Creatinine, Ser: 1.11 mg/dL (ref 0.76–1.27)
Globulin, Total: 2 g/dL (ref 1.5–4.5)
Glucose: 128 mg/dL — ABNORMAL HIGH (ref 65–99)
Potassium: 5 mmol/L (ref 3.5–5.2)
Sodium: 139 mmol/L (ref 134–144)
Total Protein: 6.5 g/dL (ref 6.0–8.5)
eGFR: 71 mL/min/{1.73_m2} (ref >59)

## 2020-12-24 LAB — CBC
Hematocrit: 43.6 % (ref 37.5–51.0)
Hemoglobin: 14.8 g/dL (ref 13.0–17.7)
MCH: 30.8 pg (ref 26.6–33.0)
MCHC: 33.9 g/dL (ref 31.5–35.7)
MCV: 91 fL (ref 79–97)
Platelets: 174 10*3/uL (ref 150–450)
RBC: 4.81 x10E6/uL (ref 4.14–5.80)
RDW: 11.4 % — ABNORMAL LOW (ref 11.6–15.4)
WBC: 8 10*3/uL (ref 3.4–10.8)

## 2020-12-24 LAB — LIPID PANEL
Chol/HDL Ratio: 2.4 ratio (ref 0.0–5.0)
Cholesterol, Total: 123 mg/dL (ref 100–199)
HDL: 52 mg/dL (ref >39)
LDL Chol Calc (NIH): 54 mg/dL (ref 0–99)
Triglycerides: 87 mg/dL (ref 0–149)
VLDL Cholesterol Cal: 17 mg/dL (ref 5–40)

## 2020-12-24 LAB — HEMOGLOBIN A1C
Est. average glucose Bld gHb Est-mCnc: 126 mg/dL
Hgb A1c MFr Bld: 6 % — ABNORMAL HIGH (ref 4.8–5.6)

## 2020-12-24 LAB — PSA TOTAL (REFLEX TO FREE): Prostate Specific Ag, Serum: 0.6 ng/mL (ref 0.0–4.0)

## 2021-01-01 ENCOUNTER — Other Ambulatory Visit: Payer: Self-pay | Admitting: Family Medicine

## 2021-01-28 ENCOUNTER — Other Ambulatory Visit: Payer: Self-pay | Admitting: Family Medicine

## 2021-01-28 ENCOUNTER — Encounter (INDEPENDENT_AMBULATORY_CARE_PROVIDER_SITE_OTHER): Payer: Self-pay | Admitting: Ophthalmology

## 2021-01-28 ENCOUNTER — Other Ambulatory Visit (INDEPENDENT_AMBULATORY_CARE_PROVIDER_SITE_OTHER): Payer: Self-pay | Admitting: Ophthalmology

## 2021-01-28 DIAGNOSIS — M47816 Spondylosis without myelopathy or radiculopathy, lumbar region: Secondary | ICD-10-CM

## 2021-01-28 MED ORDER — BRIMONIDINE TARTRATE 0.1 % OP SOLN
1.0000 [drp] | Freq: Two times a day (BID) | OPHTHALMIC | 10 refills | Status: DC
Start: 2021-01-28 — End: 2022-03-16

## 2021-01-29 ENCOUNTER — Other Ambulatory Visit: Payer: Self-pay | Admitting: Family Medicine

## 2021-01-29 MED ORDER — HYDROCODONE-ACETAMINOPHEN 5-325 MG PO TABS
1.0000 | ORAL_TABLET | Freq: Four times a day (QID) | ORAL | 0 refills | Status: DC | PRN
Start: 2021-01-29 — End: 2021-06-04

## 2021-03-17 NOTE — Progress Notes (Signed)
[facility] Note  03/23/2021     CHIEF COMPLAINT Patient presents for Retina Follow Up   HISTORY OF PRESENT ILLNESS: Garrett Green is a 75 y.o. male who presents to the clinic today for:   HPI     Retina Follow Up   Patient presents with  Other.  In left eye.  Severity is moderate.  Duration of 6 months.  Since onset it is stable.  I, the attending physician,  performed the HPI with the patient and updated documentation appropriately.        Comments   Pt here for 6 mo ret f/u for CME OS. Pt states vision in OS isnt as good as it was before. Pt does report elevated IOP, was seen @ [facility]-Dr. Rick Duff. Pt does report taking drops, Alphagan P BID OS, Prolensa QD OS and PF BID OS.       Last edited by Bernarda Caffey, MD on 03/23/2021  1:03 PM.     Pt reports using PF BID OS and Prolensa Qdaily OS as well as Alphagan P BID OS, pt states he is no longer using timolol bc it was making his eye red   Referring physician: Thelma Comp, [place],  [place] [address]  HISTORICAL INFORMATION:   Selected notes from the MEDICAL RECORD NUMBER Referred by Dr. Rick Duff   CURRENT MEDICATIONS: Current Outpatient Medications (Ophthalmic Drugs)  Medication Sig   brimonidine (ALPHAGAN P) 0.1 % SOLN Place 1 drop into both eyes 2 (two) times daily.   Loteprednol Etabonate (LOTEMAX SM) 0.38 % GEL Place 1 drop into the left eye daily.   prednisoLONE acetate (PRED FORTE) 1 % ophthalmic suspension Place 1 drop into the left eye daily.   Bromfenac Sodium (PROLENSA) 0.07 % SOLN Place 1 drop into the left eye daily.   No current facility-administered medications for this visit. (Ophthalmic Drugs)   Current Outpatient Medications (Other)  Medication Sig   aspirin 325 MG tablet Take 325 mg by mouth daily.   atorvastatin (LIPITOR) 40 MG tablet Take 1 tablet (40 mg total) by mouth daily.   clopidogrel (PLAVIX) 75 MG tablet TAKE 1  TABLET BY MOUTH EVERY DAY   fluticasone (FLONASE) 50 MCG/ACT nasal spray Place 2 sprays into both nostrils daily as needed for allergies.   HYDROcodone-acetaminophen (NORCO/VICODIN) 5-325 MG tablet Take 1 tablet by mouth every 6 (six) hours as needed.   loratadine (CLARITIN) 10 MG tablet Take 10 mg by mouth daily as needed for allergies.   ramipril (ALTACE) 10 MG capsule TAKE 1 CAPSULE BY MOUTH  DAILY   zolpidem (AMBIEN) 10 MG tablet Take 0.5-1 tablets (5-10 mg total) by mouth at bedtime as needed for sleep.   LORazepam (ATIVAN) 1 MG tablet Take 1 tablet (1 mg total) by mouth 3 (three) times daily as needed. (Patient taking differently: Take 1 mg by mouth as directed. Only for flying)   No current facility-administered medications for this visit. (Other)   REVIEW OF SYSTEMS: ROS   Positive for: Gastrointestinal, Neurological, Musculoskeletal, Cardiovascular, Eyes Negative for: Constitutional, Skin, Genitourinary, HENT, Endocrine, Respiratory, Psychiatric, Allergic/Imm, Heme/Lymph Last edited by Kingsley Spittle, COT on 03/23/2021  8:28 AM.     ALLERGIES Allergies  Allergen Reactions   Sulfa Antibiotics Other (See Comments)    Lethargic and hypotension    PAST MEDICAL HISTORY Past Medical History:  Diagnosis Date   COVID-19 07/2019   History of blood transfusion 2014   "  related to back OR"   History of chicken pox    Hypertensive retinopathy    OU   Retinal detachment    OS   Stroke ([place])    TIA (transient ischemic attack) 10/17/2017; 10/19/2017   Past Surgical History:  Procedure Laterality Date   BACK SURGERY     CATARACT EXTRACTION Right 05/2019   Dr. Talbert Forest   CATARACT EXTRACTION Left 2017   CATARACT EXTRACTION W/ INTRAOCULAR LENS IMPLANT Left    COLONOSCOPY WITH PROPOFOL N/A 03/16/2016   Procedure: COLONOSCOPY WITH PROPOFOL;  Surgeon: Manya Silvas, MD;  Location: [facility];  Service: Endoscopy;  Laterality: N/A;   ESOPHAGOGASTRODUODENOSCOPY (EGD) WITH  ESOPHAGEAL DILATION  1990s   EYE SURGERY Bilateral    Cat Sx   IR ANGIO INTRA EXTRACRAN SEL COM CAROTID INNOMINATE BILAT MOD SED  10/31/2017   IR ANGIO INTRA EXTRACRAN SEL COM CAROTID INNOMINATE BILAT MOD SED  01/15/2019   IR ANGIO INTRA EXTRACRAN SEL COM CAROTID INNOMINATE BILAT MOD SED  09/27/2019   IR ANGIO VERTEBRAL SEL VERTEBRAL BILAT MOD SED  10/31/2017   IR ANGIO VERTEBRAL SEL VERTEBRAL BILAT MOD SED  01/15/2019   IR ANGIO VERTEBRAL SEL VERTEBRAL BILAT MOD SED  09/27/2019   IR US GUIDE VASC ACCESS RIGHT  01/15/2019   IR US GUIDE VASC ACCESS RIGHT  09/27/2019   LUMBAR LAMINECTOMY/DECOMPRESSION MICRODISCECTOMY Left 07/26/2012   Procedure: LUMBAR LAMINECTOMY/DECOMPRESSION MICRODISCECTOMY 1 LEVEL;  Surgeon: Eustace Moore, MD;  Location: [facility];  Service: Neurosurgery;  Laterality: Left;  lumbar five sacral one   RETINAL DETACHMENT SURGERY Left 2016   Dr. Posey Pronto   SHOULDER ARTHROSCOPY WITH OPEN ROTATOR CUFF REPAIR Left 1990s   TONSILLECTOMY     YAG LASER APPLICATION Left 82/5003   Dr. Talbert Forest    FAMILY HISTORY Family History  Problem Relation Age of Onset   Heart disease Mother    Cancer Sister    Dementia Brother    Dementia Sister    Diabetes Brother    Diabetes Sister    Colon cancer Neg Hx    Prostate cancer Neg Hx     SOCIAL HISTORY Social History   Tobacco Use   Smoking status: Former    Packs/day: 1.00    Years: 25.00    Pack years: 25.00    Types: Cigarettes    Quit date: 03/03/1988    Years since quitting: 33.0   Smokeless tobacco: Never  Vaping Use   Vaping Use: Never used  Substance Use Topics   Alcohol use: Yes    Alcohol/week: 1.0 standard drink    Types: 1 Standard drinks or equivalent per week   Drug use: Never       OPHTHALMIC EXAM:  Base Eye Exam     Visual Acuity (Snellen - Linear)       Right Left   Dist [place] 20/20 -1 20/70 -1   Dist ph [place]  20/60 -2         Tonometry (Tonopen, 8:39 AM)       Right Left   Pressure 16 25          Tonometry #2 (Tonopen, 8:58 AM)       Right Left   Pressure 15 25         Pupils       Dark Light Shape React APD   Right 2 1 Round Minimal None   Left 2 1 Round Minimal None  Visual Fields (Counting fingers)       Left Right    Full Full         Extraocular Movement       Right Left    Full, Ortho Full, Ortho         Neuro/Psych     Oriented x3: Yes   Mood/Affect: Normal         Dilation     Both eyes: 1.0% Mydriacyl, 2.5% Phenylephrine @ 8:40 AM           Slit Lamp and Fundus Exam     Slit Lamp Exam       Right Left   Lids/Lashes Dermatochalasis - upper lid Dermatochalasis - upper lid   Conjunctiva/Sclera Mild nasal Pinguecula 1+ Injection, prominent limbal vessels nasally   Cornea Mild arcus, well healed temporal cataract wounds, 1-2+ inferior Punctate epithelial erosions Mild arcus, well healed temporal cataract wounds, 2+ central Punctate epithelial erosions   Anterior Chamber Deep and quiet Deep, 0.5+cell/pigment   Iris Round and dilated mild iridodonesis, Round and poorly dilated to 3.3   Lens PC IOL in good position PC IOL with fine pigment depositon, open PC   Anterior Vitreous Vitreous syneresis, Posterior vitreous detachment, vitreous condensations Vitreous syneresis         Fundus Exam       Right Left   Disc Pink and Sharp Pink and Sharp, +PPA   C/D Ratio 0.4 0.6   Macula Flat, Blunted foveal reflex, ERM, mild RPE mottling and clumping, No heme or edema Blunted foveal reflex, central CME - stably improved, +ERM, RPE mottling and clumping, no heme   Vessels Attenuated, tortuous, mild AV crossing changes, mild Copper wiring Attenuated, mild tortuousity   Periphery Attached    limited view due to poor dilation, attached, no heme, inferior paving stone degeneration / CR atrophy           Refraction     Manifest Refraction       Sphere Cylinder Axis Dist VA   Right       Left -0.75 +0.75 170 20/80             IMAGING AND PROCEDURES  Imaging and Procedures for 03/23/2021  OCT, Retina - OU - Both Eyes       Right Eye Quality was good. Central Foveal Thickness: 336. Progression has been stable. Findings include no IRF, no SRF, abnormal foveal contour, epiretinal membrane (Mild ERM with blunting of central foveal contour).   Left Eye Quality was good. Central Foveal Thickness: 354. Progression has been stable. Findings include abnormal foveal contour, epiretinal membrane, no SRF, no IRF, macular pucker (Mild ERM with blunted foveal profile and mild pucker).   Notes *Images captured and stored on drive  Diagnosis / Impression:  OD: Mild ERM with blunting of central foveal contour OS: Mild ERM with blunted foveal profile and mild pucker  Clinical management:  See below  Abbreviations: NFP - Normal foveal profile. CME - cystoid macular edema. PED - pigment epithelial detachment. IRF - intraretinal fluid. SRF - subretinal fluid. EZ - ellipsoid zone. ERM - epiretinal membrane. ORA - outer retinal atrophy. ORT - outer retinal tubulation. SRHM - subretinal hyper-reflective material. IRHM - intraretinal hyper-reflective material               ASSESSMENT/PLAN:    ICD-10-CM   1. Cystoid macular edema of left eye  H35.352     2. Retinal edema  H35.81 OCT, Retina -  OU - Both Eyes    3. History of retinal detachment  Z86.69     4. Epiretinal membrane (ERM) of both eyes  H35.373     5. Posterior vitreous detachment of right eye  H43.811     6. Essential hypertension  I10     7. Hypertensive retinopathy of both eyes  H35.033     8. Pseudophakia, both eyes  Z96.1     9. Primary open angle glaucoma of both eyes, unspecified glaucoma stage  H40.1130     10. Dry eyes  H04.123      1,2. CME OS  - pt with history of complicated CEIOL OS w/ RD in 2016  - history of CME since 2016 previously managed by Dr. Armanda Heritage   - was receiving intravitreal injections per pt report -- last  visit w/ Dr. Posey Pronto in January 2021  - started on PF and Prolensa QID OS here on 09.02.21  - stopped latanoprost on 12.14.21  - BCVA OS slightly decreased to 20/60 from 20/50  - OCT shows stable improvement in CME  - history of glaucoma / steroid response and was previously on timolol qdaily OU, Alphagan P BID OU, and Latanoprost qhs OU  - pt reports no longer taking timolol or latanoprost per Dr. Rick Duff - pt reports taking PF BID OS and Prolensa qdaily -- was supposed to be qdaily for both - IOP elevated to 26,27 OS -- ?steroid response - will start Vyzulta qhs OS due to increased IOP today  - cont Prolensa qdaily, but will switch PF to Lotemax SM qdaily OS to address possible steroid response  - discussed possible need for low dose, long-term maintenance steroid/Prolensa, given history of recurrent CME  - f/u 2-3 weeks, IOP check, DFE, OCT  3. History of RD OS  - 2016 after cataract surgery Manuella Ghazi)  - s/p PPV w/ Dr. Posey Pronto in 2016  - retina attached; appears stable  - monitor  4. Epiretinal membrane, both eyes  - The natural history, anatomy, potential for loss of vision, and treatment options including vitrectomy techniques and the complications of endophthalmitis, retinal detachment, vitreous hemorrhage, cataract progression and permanent vision loss discussed with the patient. - mild ERM OU - BCVA OD: 20/20; OS: 20/60 - asymptomatic, no metamorphopsia - OCT shows mild progression of ERM OU and interval blunting of foveal contour - no indication for surgery at this time - monitor for now  5. PVD / vitreous syneresis OD+  - symptomatic floaters OD -- improved  - Discussed findings and prognosis  - No RT or RD on 360 peripheral exam  - Reviewed s/s of RT/RD  - Strict return precautions for any such RT/RD signs/symptoms  - f/u in 6 wks -- DFE/OCT  6,7. Hypertensive retinopathy OU - discussed importance of tight BP control - monitor  8. Pseudophakia OU  - s/p CE/IOL (OD:  Dr. Talbert Forest, OS: Dr. Ruthell Rummage, 2016)  - s/p YAG OS -- [place], May 2021  - OS with iridonesis and poor dilation - monitor  9. POAG OU  - IOP 16,25 today -- ?steroid response  - currently on timolol qdaily OU, Alphagan P BID OU -- pt not taking timolol  - pt has stopped latan OU as above -- will restart Vyzulta qhs OS today due to increased IOP  - pt reports irritation w/ timolol  10. Dry eyes OU - recommend artificial tears and lubricating ointment as needed  Ophthalmic Meds Ordered this visit:  Meds ordered  this encounter  Medications   Bromfenac Sodium (PROLENSA) 0.07 % SOLN    Sig: Place 1 drop into the left eye daily.    Dispense:  6 mL    Refill:  10   Loteprednol Etabonate (LOTEMAX SM) 0.38 % GEL    Sig: Place 1 drop into the left eye daily.    Dispense:  5 g    Refill:  10     Return for f/u 2-3 weeks, IOP check OS.  There are no Patient Instructions on file for this visit.   This document serves as a record of services personally performed by Gardiner Sleeper, MD, PhD. It was created on their behalf by Estill Bakes, COT an ophthalmic technician. The creation of this record is the provider's dictation and/or activities during the visit.    Electronically signed by: Estill Bakes, COT 12.7.22 @ 1:04 PM   This document serves as a record of services personally performed by Gardiner Sleeper, MD, PhD. It was created on their behalf by San Jetty. Owens Shark, OA an ophthalmic technician. The creation of this record is the provider's dictation and/or activities during the visit.    Electronically signed by: San Jetty. Owens Shark, [place] 12.13.2022 1:04 PM  Gardiner Sleeper, M.D., Ph.D. Diseases & Surgery of the Retina and Vitreous [facility]  I have reviewed the above documentation for accuracy and completeness, and I agree with the above. Gardiner Sleeper, M.D., Ph.D. 03/23/21 1:10 PM   Abbreviations: M myopia (nearsighted); A astigmatism; H hyperopia (farsighted); P  presbyopia; Mrx spectacle prescription;  CTL contact lenses; OD right eye; OS left eye; OU both eyes  XT exotropia; ET esotropia; PEK punctate epithelial keratitis; PEE punctate epithelial erosions; DES dry eye syndrome; MGD meibomian gland dysfunction; ATs artificial tears; PFAT's preservative free artificial tears; [place] nuclear sclerotic cataract; PSC posterior subcapsular cataract; ERM epi-retinal membrane; PVD posterior vitreous detachment; RD retinal detachment; DM diabetes mellitus; DR diabetic retinopathy; NPDR non-proliferative diabetic retinopathy; PDR proliferative diabetic retinopathy; CSME clinically significant macular edema; DME diabetic macular edema; dbh dot blot hemorrhages; CWS cotton wool spot; POAG primary open angle glaucoma; C/D cup-to-disc ratio; HVF humphrey visual field; GVF goldmann visual field; OCT optical coherence tomography; IOP intraocular pressure; BRVO Branch retinal vein occlusion; CRVO central retinal vein occlusion; CRAO central retinal artery occlusion; BRAO branch retinal artery occlusion; RT retinal tear; SB scleral buckle; PPV pars plana vitrectomy; VH Vitreous hemorrhage; PRP panretinal laser photocoagulation; IVK intravitreal kenalog; VMT vitreomacular traction; MH Macular hole;  NVD neovascularization of the disc; NVE neovascularization elsewhere; AREDS age related eye disease study; ARMD age related macular degeneration; POAG primary open angle glaucoma; EBMD epithelial/anterior basement membrane dystrophy; ACIOL anterior chamber intraocular lens; IOL intraocular lens; PCIOL posterior chamber intraocular lens; Phaco/IOL phacoemulsification with intraocular lens placement; [place] photorefractive keratectomy; LASIK laser assisted in situ keratomileusis; HTN hypertension; DM diabetes mellitus; COPD chronic obstructive pulmonary disease

## 2021-03-23 ENCOUNTER — Ambulatory Visit (INDEPENDENT_AMBULATORY_CARE_PROVIDER_SITE_OTHER): Payer: Medicare Other | Admitting: Ophthalmology

## 2021-03-23 ENCOUNTER — Encounter (INDEPENDENT_AMBULATORY_CARE_PROVIDER_SITE_OTHER): Payer: Self-pay | Admitting: Ophthalmology

## 2021-03-23 ENCOUNTER — Other Ambulatory Visit: Payer: Self-pay

## 2021-03-23 DIAGNOSIS — I10 Essential (primary) hypertension: Secondary | ICD-10-CM | POA: Diagnosis not present

## 2021-03-23 DIAGNOSIS — H35373 Puckering of macula, bilateral: Secondary | ICD-10-CM

## 2021-03-23 DIAGNOSIS — H3581 Retinal edema: Secondary | ICD-10-CM

## 2021-03-23 DIAGNOSIS — H35352 Cystoid macular degeneration, left eye: Secondary | ICD-10-CM

## 2021-03-23 DIAGNOSIS — Z8669 Personal history of other diseases of the nervous system and sense organs: Secondary | ICD-10-CM

## 2021-03-23 DIAGNOSIS — H43811 Vitreous degeneration, right eye: Secondary | ICD-10-CM

## 2021-03-23 DIAGNOSIS — H35033 Hypertensive retinopathy, bilateral: Secondary | ICD-10-CM | POA: Diagnosis not present

## 2021-03-23 DIAGNOSIS — Z961 Presence of intraocular lens: Secondary | ICD-10-CM

## 2021-03-23 DIAGNOSIS — H401130 Primary open-angle glaucoma, bilateral, stage unspecified: Secondary | ICD-10-CM

## 2021-03-23 DIAGNOSIS — H04123 Dry eye syndrome of bilateral lacrimal glands: Secondary | ICD-10-CM

## 2021-03-23 MED ORDER — PROLENSA 0.07 % OP SOLN: 1.0000 [drp] | Freq: Every day | OPHTHALMIC | 10 refills | Status: AC

## 2021-03-23 MED ORDER — LOTEMAX SM 0.38 % OP GEL: 1.0000 [drp] | Freq: Every day | OPHTHALMIC | 10 refills | Status: DC

## 2021-03-25 ENCOUNTER — Ambulatory Visit (INDEPENDENT_AMBULATORY_CARE_PROVIDER_SITE_OTHER): Payer: Medicare Other | Admitting: Dermatology

## 2021-03-25 ENCOUNTER — Encounter: Payer: Self-pay | Admitting: Dermatology

## 2021-03-25 ENCOUNTER — Other Ambulatory Visit: Payer: Self-pay

## 2021-03-25 DIAGNOSIS — L578 Other skin changes due to chronic exposure to nonionizing radiation: Secondary | ICD-10-CM

## 2021-03-25 DIAGNOSIS — D1801 Hemangioma of skin and subcutaneous tissue: Secondary | ICD-10-CM

## 2021-03-25 DIAGNOSIS — D229 Melanocytic nevi, unspecified: Secondary | ICD-10-CM

## 2021-03-25 DIAGNOSIS — L814 Other melanin hyperpigmentation: Secondary | ICD-10-CM

## 2021-03-25 DIAGNOSIS — Z1283 Encounter for screening for malignant neoplasm of skin: Secondary | ICD-10-CM | POA: Diagnosis not present

## 2021-03-25 DIAGNOSIS — L738 Other specified follicular disorders: Secondary | ICD-10-CM | POA: Diagnosis not present

## 2021-03-25 DIAGNOSIS — L570 Actinic keratosis: Secondary | ICD-10-CM

## 2021-03-25 DIAGNOSIS — Z872 Personal history of diseases of the skin and subcutaneous tissue: Secondary | ICD-10-CM

## 2021-03-25 DIAGNOSIS — L219 Seborrheic dermatitis, unspecified: Secondary | ICD-10-CM | POA: Diagnosis not present

## 2021-03-25 DIAGNOSIS — L821 Other seborrheic keratosis: Secondary | ICD-10-CM

## 2021-03-25 NOTE — Progress Notes (Signed)
Follow-Up Visit   Subjective  Garrett Green is a 75 y.o. male who presents for the following: FBSE (Patient here for full body skin exam and skin cancer screening. Patient with hx of AK's. He has noticed a new spot at [facility] area, present for a few weeks. /).  Patient accompanied by wife.   The following portions of the chart were reviewed this encounter and updated as appropriate:   Tobacco   Allergies   Meds   Problems   Med Hx   Surg Hx   Fam Hx       Review of Systems:  No other skin or systemic complaints except as noted in HPI or Assessment and Plan.  Objective  Well appearing patient in no apparent distress; mood and affect are within normal limits.  A full examination was performed including scalp, head, eyes, ears, nose, lips, neck, chest, axillae, abdomen, back, buttocks, bilateral upper extremities, bilateral lower extremities, hands, feet, fingers, toes, fingernails, and toenails. All findings within normal limits unless otherwise noted below.  face Small yellow papules with a central dell.   right forehead x 5, frontal scalp x 2 (7) Erythematous thin papules/macules with gritty scale.   Head - Anterior (Face) Scaly erythematous patch at [facility]   Assessment & Plan  Sebaceous hyperplasia face  Benign-appearing.  Observation.  Call clinic for new or changing lesions.   AK (actinic keratosis) (7) right forehead x 5, frontal scalp x 2  Prior to procedure, discussed risks of blister formation, small wound, skin dyspigmentation, or rare scar following cryotherapy. Recommend Vaseline ointment to treated areas while healing.  Actinic keratoses are precancerous spots that appear secondary to cumulative UV radiation exposure/sun exposure over time. They are chronic with expected duration over 1 year. A portion of actinic keratoses will progress to squamous cell carcinoma of the skin. It is not possible to reliably predict which spots will progress to skin cancer  and so treatment is recommended to prevent development of skin cancer.  Recommend daily broad spectrum sunscreen SPF 30+ to sun-exposed areas, reapply every 2 hours as needed.  Recommend staying in the shade or wearing long sleeves, sun glasses (UVA+UVB protection) and wide brim hats (4-inch brim around the entire circumference of the hat). Call for new or changing lesions.  Pt prefers to call if not resolved rather than schedule follow-up for recheck Aks.   Destruction of lesion - right forehead x 5, frontal scalp x 2  Destruction method: cryotherapy   Informed consent: discussed and consent obtained   Lesion destroyed using liquid nitrogen: Yes   Cryotherapy cycles:  2 Outcome: patient tolerated procedure well with no complications   Post-procedure details: wound care instructions given    Seborrheic dermatitis Head - Anterior (Face)  Seborrheic Dermatitis  -  is a chronic persistent rash characterized by pinkness and scaling most commonly of the mid face but also can occur on the scalp (dandruff), ears; mid chest, mid back and groin.  It tends to be exacerbated by stress and cooler weather.  People who have neurologic disease may experience new onset or exacerbation of existing seborrheic dermatitis.  The condition is not curable but treatable and can be controlled.  Pt declines treatment at this time  Reassured no evidence of malignancy on exam today  Lentigines - Scattered tan macules - Due to sun exposure - Benign-appearing, observe - Recommend daily broad spectrum sunscreen SPF 30+ to sun-exposed areas, reapply every 2 hours as needed. - Call for  any changes  Seborrheic Keratoses - Stuck-on, waxy, tan-brown papules and/or plaques  - Benign-appearing - Discussed benign etiology and prognosis. - Observe - Call for any changes  Melanocytic Nevi - Tan-brown and/or pink-flesh-colored symmetric macules and papules - Benign appearing on exam today - Observation - Call  clinic for new or changing moles - Recommend daily use of broad spectrum spf 30+ sunscreen to sun-exposed areas.   Hemangiomas - Red papules - Discussed benign nature - Observe - Call for any changes  Actinic Damage - Chronic condition, secondary to cumulative UV/sun exposure - diffuse scaly erythematous macules with underlying dyspigmentation - Recommend daily broad spectrum sunscreen SPF 30+ to sun-exposed areas, reapply every 2 hours as needed.  - Staying in the shade or wearing long sleeves, sun glasses (UVA+UVB protection) and wide brim hats (4-inch brim around the entire circumference of the hat) are also recommended for sun protection.  - Call for new or changing lesions.  Skin cancer screening performed today.  History of PreCancerous Actinic Keratosis  - site(s) of PreCancerous Actinic Keratosis clear today. - these may recur and new lesions may form requiring treatment to prevent transformation into skin cancer - observe for new or changing spots and contact [place] for appointment if occur - photoprotection with sun protective clothing; sunglasses and broad spectrum sunscreen with SPF of at least 30 + and frequent self skin exams recommended - yearly exams by a dermatologist recommended for persons with history of PreCancerous Actinic Keratoses  Return in about 1 year (around 03/25/2022) for TBSE.  Graciella Belton, RMA, am acting as scribe for Forest Gleason, MD .  Documentation: I have reviewed the above documentation for accuracy and completeness, and I agree with the above.  Forest Gleason, MD

## 2021-03-25 NOTE — Patient Instructions (Addendum)
Cryotherapy Aftercare  Wash gently with soap and water everyday.   Apply Vaseline and Band-Aid daily until healed.   Prior to procedure, discussed risks of blister formation, small wound, skin dyspigmentation, or rare scar following cryotherapy. Recommend Vaseline ointment to treated areas while healing.  Melanoma ABCDEs  Melanoma is the most dangerous type of skin cancer, and is the leading cause of death from skin disease.  You are more likely to develop melanoma if you: Have light-colored skin, light-colored eyes, or red or blond hair Spend a lot of time in the sun Tan regularly, either outdoors or in a tanning bed Have had blistering sunburns, especially during childhood Have a close family member who has had a melanoma Have atypical moles or large birthmarks  Early detection of melanoma is key since treatment is typically straightforward and cure rates are extremely high if we catch it early.   The first sign of melanoma is often a change in a mole or a new dark spot.  The ABCDE system is a way of remembering the signs of melanoma.  A for asymmetry:  The two halves do not match. B for border:  The edges of the growth are irregular. C for color:  A mixture of colors are present instead of an even brown color. D for diameter:  Melanomas are usually (but not always) greater than 6mm - the size of a pencil eraser. E for evolution:  The spot keeps changing in size, shape, and color.  Please check your skin once per month between visits. You can use a small mirror in front and a large mirror behind you to keep an eye on the back side or your body.   If you see any new or changing lesions before your next follow-up, please call to schedule a visit.  Please continue daily skin protection including broad spectrum sunscreen SPF 30+ to sun-exposed areas, reapplying every 2 hours as needed when you're outdoors.    If You Need Anything After Your Visit  If you have any questions or  concerns for your doctor, please call our main line at [phone] and press option 4 to reach your doctor's medical assistant. If no one answers, please leave a voicemail as directed and we will return your call as soon as possible. Messages left after 4 pm will be answered the following business day.   You may also send us a message via MyChart. We typically respond to MyChart messages within 1-2 business days.  For prescription refills, please ask your pharmacy to contact our office. Our fax number is [phone].  If you have an urgent issue when the clinic is closed that cannot wait until the next business day, you can page your doctor at the number below.    Please note that while we do our best to be available for urgent issues outside of office hours, we are not available 24/7.   If you have an urgent issue and are unable to reach us, you may choose to seek medical care at your doctor's office, retail clinic, urgent care center, or emergency room.  If you have a medical emergency, please immediately call 911 or go to the emergency department.  Pager Numbers  - Dr. Kowalski: [phone]  - Dr. Moye: [phone]  - Dr. Stewart: [phone]  In the event of inclement weather, please call our main line at [phone] for an update on the status of any delays or closures.  Dermatology Medication Tips: Please keep the boxes that   topical medications come in in order to help keep track of the instructions about where and how to use these. Pharmacies typically print the medication instructions only on the boxes and not directly on the medication tubes.   If your medication is too expensive, please contact our office at [phone] option 4 or send us a message through MyChart.   We are unable to tell what your co-pay for medications will be in advance as this is different depending on your insurance coverage. However, we may be able to find a substitute medication at lower cost or  fill out paperwork to get insurance to cover a needed medication.   If a prior authorization is required to get your medication covered by your insurance company, please allow us 1-2 business days to complete this process.  Drug prices often vary depending on where the prescription is filled and some pharmacies may offer cheaper prices.  The website www.goodrx.com contains coupons for medications through different pharmacies. The prices here do not account for what the cost may be with help from insurance (it may be cheaper with your insurance), but the website can give you the price if you did not use any insurance.  - You can print the associated coupon and take it with your prescription to the pharmacy.  - You may also stop by our office during regular business hours and pick up a GoodRx coupon card.  - If you need your prescription sent electronically to a different pharmacy, notify our office through [facility] MyChart or by phone at [phone] option 4.     Si Usted Necesita Algo Despus de Su Visita  Tambin puede enviarnos un mensaje a travs de MyChart. Por lo general respondemos a los mensajes de MyChart en el transcurso de 1 a 2 das hbiles.  Para renovar recetas, por favor pida a su farmacia que se ponga en contacto con nuestra oficina. Nuestro nmero de fax es el [phone].  Si tiene un asunto urgente cuando la clnica est cerrada y que no puede esperar hasta el siguiente da hbil, puede llamar/localizar a su doctor(a) al nmero que aparece a continuacin.   Por favor, tenga en cuenta que aunque hacemos todo lo posible para estar disponibles para asuntos urgentes fuera del horario de oficina, no estamos disponibles las 24 horas del da, los 7 das de la semana.   Si tiene un problema urgente y no puede comunicarse con nosotros, puede optar por buscar atencin mdica  en el consultorio de su doctor(a), en una clnica privada, en un centro de atencin urgente o en una sala  de emergencias.  Si tiene una emergencia mdica, por favor llame inmediatamente al 911 o vaya a la sala de emergencias.  Nmeros de bper  - Dr. Kowalski: [phone]  - Dra. Moye: [phone]  - Dra. Stewart: [phone]  En caso de inclemencias del tiempo, por favor llame a nuestra lnea principal al [phone] para una actualizacin sobre el estado de cualquier retraso o cierre.  Consejos para la medicacin en dermatologa: Por favor, guarde las cajas en las que vienen los medicamentos de uso tpico para ayudarle a seguir las instrucciones sobre dnde y cmo usarlos. Las farmacias generalmente imprimen las instrucciones del medicamento slo en las cajas y no directamente en los tubos del medicamento.   Si su medicamento es muy caro, por favor, pngase en contacto con nuestra oficina llamando al [phone] y presione la opcin 4 o envenos un mensaje a travs de MyChart.   No   podemos decirle cul ser su copago por los medicamentos por adelantado ya que esto es diferente dependiendo de la cobertura de su seguro. Sin embargo, es posible que podamos encontrar un medicamento sustituto a menor costo o llenar un formulario para que el seguro cubra el medicamento que se considera necesario.   Si se requiere una autorizacin previa para que su compaa de seguros cubra su medicamento, por favor permtanos de 1 a 2 das hbiles para completar este proceso.  Los precios de los medicamentos varan con frecuencia dependiendo del lugar de dnde se surte la receta y alguna farmacias pueden ofrecer precios ms baratos.  El sitio web www.goodrx.com tiene cupones para medicamentos de diferentes farmacias. Los precios aqu no tienen en cuenta lo que podra costar con la ayuda del seguro (puede ser ms barato con su seguro), pero el sitio web puede darle el precio si no utiliz ningn seguro.  - Puede imprimir el cupn correspondiente y llevarlo con su receta a la farmacia.  - Tambin puede pasar  por nuestra oficina durante el horario de atencin regular y recoger una tarjeta de cupones de GoodRx.  - Si necesita que su receta se enve electrnicamente a una farmacia diferente, informe a nuestra oficina a travs de MyChart de [facility] o por telfono llamando al [phone] y presione la opcin 4.  

## 2021-03-26 ENCOUNTER — Encounter: Payer: Self-pay | Admitting: Urology

## 2021-03-26 ENCOUNTER — Ambulatory Visit (INDEPENDENT_AMBULATORY_CARE_PROVIDER_SITE_OTHER): Payer: Medicare Other | Admitting: Urology

## 2021-03-26 VITALS — BP 175/74 | HR 76 | Ht 68.0 in | Wt 174.0 lb

## 2021-03-26 DIAGNOSIS — R361 Hematospermia: Secondary | ICD-10-CM

## 2021-03-26 LAB — URINALYSIS, COMPLETE
Bilirubin, UA: NEGATIVE
Glucose, UA: NEGATIVE
Leukocytes,UA: NEGATIVE
Nitrite, UA: NEGATIVE
Protein,UA: NEGATIVE
RBC, UA: NEGATIVE
Specific Gravity, UA: 1.025 (ref 1.005–1.030)
Urobilinogen, Ur: 0.2 mg/dL (ref 0.2–1.0)
pH, UA: 5.5 (ref 5.0–7.5)

## 2021-03-26 LAB — MICROSCOPIC EXAMINATION
Bacteria, UA: NONE SEEN
WBC, UA: NONE SEEN /hpf (ref 0–5)

## 2021-03-26 NOTE — Progress Notes (Signed)
03/26/2021 9:33 AM   Adline Potter 16-Dec-1945 [id number]  Referring provider: Birdie Sons, MD [address] [place],  [place] [address]  Chief Complaint  Patient presents with   Other    HPI: Garrett Green is a 75 y.o. male referred for evaluation of hematospermia.  For the last 12 months has noted intermittent pink-tinged semen Last week after ejaculation he noted decreased semen viscosity and the appearance of "pure blood" No pain with ejaculation or gross hematuria PSA has been checked regularly and 3 months ago was stable at 0.3 On ASA/Plavix for prior CVA No bothersome LUTS or dysuria   PMH: Past Medical History:  Diagnosis Date   COVID-19 07/2019   History of blood transfusion 2014   "related to back OR"   History of chicken pox    Hypertensive retinopathy    OU   Retinal detachment    OS   Stroke ([place])    TIA (transient ischemic attack) 10/17/2017; 10/19/2017    Surgical History: Past Surgical History:  Procedure Laterality Date   BACK SURGERY     CATARACT EXTRACTION Right 05/2019   Dr. Talbert Forest   CATARACT EXTRACTION Left 2017   CATARACT EXTRACTION W/ INTRAOCULAR LENS IMPLANT Left    COLONOSCOPY WITH PROPOFOL N/A 03/16/2016   Procedure: COLONOSCOPY WITH PROPOFOL;  Surgeon: Manya Silvas, MD;  Location: [facility];  Service: Endoscopy;  Laterality: N/A;   ESOPHAGOGASTRODUODENOSCOPY (EGD) WITH ESOPHAGEAL DILATION  1990s   EYE SURGERY Bilateral    Cat Sx   IR ANGIO INTRA EXTRACRAN SEL COM CAROTID INNOMINATE BILAT MOD SED  10/31/2017   IR ANGIO INTRA EXTRACRAN SEL COM CAROTID INNOMINATE BILAT MOD SED  01/15/2019   IR ANGIO INTRA EXTRACRAN SEL COM CAROTID INNOMINATE BILAT MOD SED  09/27/2019   IR ANGIO VERTEBRAL SEL VERTEBRAL BILAT MOD SED  10/31/2017   IR ANGIO VERTEBRAL SEL VERTEBRAL BILAT MOD SED  01/15/2019   IR ANGIO VERTEBRAL SEL VERTEBRAL BILAT MOD SED  09/27/2019   IR US GUIDE VASC ACCESS RIGHT  01/15/2019   IR US GUIDE VASC ACCESS  RIGHT  09/27/2019   LUMBAR LAMINECTOMY/DECOMPRESSION MICRODISCECTOMY Left 07/26/2012   Procedure: LUMBAR LAMINECTOMY/DECOMPRESSION MICRODISCECTOMY 1 LEVEL;  Surgeon: Eustace Moore, MD;  Location: [facility];  Service: Neurosurgery;  Laterality: Left;  lumbar five sacral one   RETINAL DETACHMENT SURGERY Left 2016   Dr. Posey Pronto   SHOULDER ARTHROSCOPY WITH OPEN ROTATOR CUFF REPAIR Left 1990s   TONSILLECTOMY     YAG LASER APPLICATION Left 19/1478   Dr. Talbert Forest    Home Medications:  Allergies as of 03/26/2021       Reactions   Sulfa Antibiotics Other (See Comments)   Lethargic and hypotension        Medication List        Accurate as of March 26, 2021  9:33 AM. If you have any questions, ask your nurse or doctor.          STOP taking these medications    LORazepam 1 MG tablet Commonly known as: ATIVAN Stopped by: Abbie Sons, MD       TAKE these medications    aspirin 325 MG tablet Take 325 mg by mouth daily.   atorvastatin 40 MG tablet Commonly known as: LIPITOR Take 1 tablet (40 mg total) by mouth daily.   brimonidine 0.1 % Soln Commonly known as: ALPHAGAN P Place 1 drop into both eyes 2 (two) times daily.   clopidogrel 75  MG tablet Commonly known as: PLAVIX TAKE 1 TABLET BY MOUTH EVERY DAY   fluticasone 50 MCG/ACT nasal spray Commonly known as: FLONASE Place 2 sprays into both nostrils daily as needed for allergies.   HYDROcodone-acetaminophen 5-325 MG tablet Commonly known as: NORCO/VICODIN Take 1 tablet by mouth every 6 (six) hours as needed.   loratadine 10 MG tablet Commonly known as: CLARITIN Take 10 mg by mouth daily as needed for allergies.   Lotemax SM 0.38 % Gel Generic drug: Loteprednol Etabonate Place 1 drop into the left eye daily.   prednisoLONE acetate 1 % ophthalmic suspension Commonly known as: PRED FORTE Place 1 drop into the left eye daily.   Prolensa 0.07 % Soln Generic drug: Bromfenac Sodium Place 1 drop into the left  eye daily.   ramipril 10 MG capsule Commonly known as: ALTACE TAKE 1 CAPSULE BY MOUTH  DAILY   zolpidem 10 MG tablet Commonly known as: AMBIEN Take 0.5-1 tablets (5-10 mg total) by mouth at bedtime as needed for sleep.        Allergies:  Allergies  Allergen Reactions   Sulfa Antibiotics Other (See Comments)    Lethargic and hypotension    Family History: Family History  Problem Relation Age of Onset   Heart disease Mother    Cancer Sister    Dementia Brother    Dementia Sister    Diabetes Brother    Diabetes Sister    Colon cancer Neg Hx    Prostate cancer Neg Hx     Social History:  reports that he quit smoking about 33 years ago. His smoking use included cigarettes. He has a 25.00 pack-year smoking history. He has never used smokeless tobacco. He reports current alcohol use of about 1.0 standard drink per week. He reports that he does not use drugs.   Physical Exam: BP (!) 175/74    Pulse 76    Ht 5\' 8"  (1.727 m)    Wt 174 lb (78.9 kg)    BMI 26.46 kg/m   Constitutional:  Alert and oriented, No acute distress. HEENT: [place] AT, moist mucus membranes.  Trachea midline, no masses. Cardiovascular: No clubbing, cyanosis, or edema. Respiratory: Normal respiratory effort, no increased work of breathing. GU: Prostate 35 g, smooth without nodules Skin: No rashes, bruises or suspicious lesions. Neurologic: Grossly intact, no focal deficits, moving all 4 extremities. Psychiatric: Normal mood and affect.  Laboratory Data:  Urinalysis Dipstick/microscopy negative   Assessment & Plan:    1.  Hematospermia Benign DRE; low and stable PSA Urinalysis normal We discussed that hematospermia is common and in the vast majority cases self-limiting and indicative of benign etiology We discussed malignant etiologies were very rare Recommend monitoring and if he has persistent hematospermia will schedule pelvic CT/cystoscopy 6 month follow-up scheduled   Abbie Sons,  MD  [facility] [address], [place], [place] [address] [phone]

## 2021-04-08 NOTE — Progress Notes (Shared)
[facility] Note  04/12/2021     CHIEF COMPLAINT Patient presents for No chief complaint on file.    HISTORY OF PRESENT ILLNESS: Garrett Green is a 75 y.o. male who presents to the clinic today for:     Pt reports using PF BID OS and Prolensa Qdaily OS as well as Alphagan P BID OS, pt states he is no longer using timolol bc it was making his eye red   Referring physician: Birdie Sons, MD [address] [place],  [place] [address]  HISTORICAL INFORMATION:   Selected notes from the MEDICAL RECORD NUMBER Referred by Dr. Rick Duff   CURRENT MEDICATIONS: Current Outpatient Medications (Ophthalmic Drugs)  Medication Sig   brimonidine (ALPHAGAN P) 0.1 % SOLN Place 1 drop into both eyes 2 (two) times daily.   Bromfenac Sodium (PROLENSA) 0.07 % SOLN Place 1 drop into the left eye daily.   Loteprednol Etabonate (LOTEMAX SM) 0.38 % GEL Place 1 drop into the left eye daily.   prednisoLONE acetate (PRED FORTE) 1 % ophthalmic suspension Place 1 drop into the left eye daily.   No current facility-administered medications for this visit. (Ophthalmic Drugs)   Current Outpatient Medications (Other)  Medication Sig   aspirin 325 MG tablet Take 325 mg by mouth daily.   atorvastatin (LIPITOR) 40 MG tablet Take 1 tablet (40 mg total) by mouth daily.   clopidogrel (PLAVIX) 75 MG tablet TAKE 1 TABLET BY MOUTH EVERY DAY   fluticasone (FLONASE) 50 MCG/ACT nasal spray Place 2 sprays into both nostrils daily as needed for allergies.   HYDROcodone-acetaminophen (NORCO/VICODIN) 5-325 MG tablet Take 1 tablet by mouth every 6 (six) hours as needed.   loratadine (CLARITIN) 10 MG tablet Take 10 mg by mouth daily as needed for allergies.   ramipril (ALTACE) 10 MG capsule TAKE 1 CAPSULE BY MOUTH  DAILY   zolpidem (AMBIEN) 10 MG tablet Take 0.5-1 tablets (5-10 mg total) by mouth at bedtime as needed for sleep.   No current facility-administered medications for  this visit. (Other)   REVIEW OF SYSTEMS:   ALLERGIES Allergies  Allergen Reactions   Sulfa Antibiotics Other (See Comments)    Lethargic and hypotension    PAST MEDICAL HISTORY Past Medical History:  Diagnosis Date   COVID-19 07/2019   History of blood transfusion 2014   "related to back OR"   History of chicken pox    Hypertensive retinopathy    OU   Retinal detachment    OS   Stroke ([place])    TIA (transient ischemic attack) 10/17/2017; 10/19/2017   Past Surgical History:  Procedure Laterality Date   BACK SURGERY     CATARACT EXTRACTION Right 05/2019   Dr. Talbert Forest   CATARACT EXTRACTION Left 2017   CATARACT EXTRACTION W/ INTRAOCULAR LENS IMPLANT Left    COLONOSCOPY WITH PROPOFOL N/A 03/16/2016   Procedure: COLONOSCOPY WITH PROPOFOL;  Surgeon: Manya Silvas, MD;  Location: [facility] ENDOSCOPY;  Service: Endoscopy;  Laterality: N/A;   ESOPHAGOGASTRODUODENOSCOPY (EGD) WITH ESOPHAGEAL DILATION  1990s   EYE SURGERY Bilateral    Cat Sx   IR ANGIO INTRA EXTRACRAN SEL COM CAROTID INNOMINATE BILAT MOD SED  10/31/2017   IR ANGIO INTRA EXTRACRAN SEL COM CAROTID INNOMINATE BILAT MOD SED  01/15/2019   IR ANGIO INTRA EXTRACRAN SEL COM CAROTID INNOMINATE BILAT MOD SED  09/27/2019   IR ANGIO VERTEBRAL SEL VERTEBRAL BILAT MOD SED  10/31/2017   IR ANGIO VERTEBRAL  SEL VERTEBRAL BILAT MOD SED  01/15/2019   IR ANGIO VERTEBRAL SEL VERTEBRAL BILAT MOD SED  09/27/2019   IR US GUIDE VASC ACCESS RIGHT  01/15/2019   IR US GUIDE VASC ACCESS RIGHT  09/27/2019   LUMBAR LAMINECTOMY/DECOMPRESSION MICRODISCECTOMY Left 07/26/2012   Procedure: LUMBAR LAMINECTOMY/DECOMPRESSION MICRODISCECTOMY 1 LEVEL;  Surgeon: Eustace Moore, MD;  Location: [facility];  Service: Neurosurgery;  Laterality: Left;  lumbar five sacral one   RETINAL DETACHMENT SURGERY Left 2016   Dr. Posey Pronto   SHOULDER ARTHROSCOPY WITH OPEN ROTATOR CUFF REPAIR Left 1990s   TONSILLECTOMY     YAG LASER APPLICATION Left 37/1696   Dr. Talbert Forest    FAMILY  HISTORY Family History  Problem Relation Age of Onset   Heart disease Mother    Cancer Sister    Dementia Brother    Dementia Sister    Diabetes Brother    Diabetes Sister    Colon cancer Neg Hx    Prostate cancer Neg Hx     SOCIAL HISTORY Social History   Tobacco Use   Smoking status: Former    Packs/day: 1.00    Years: 25.00    Pack years: 25.00    Types: Cigarettes    Quit date: 03/03/1988    Years since quitting: 33.1   Smokeless tobacco: Never  Vaping Use   Vaping Use: Never used  Substance Use Topics   Alcohol use: Yes    Alcohol/week: 1.0 standard drink    Types: 1 Standard drinks or equivalent per week   Drug use: Never       OPHTHALMIC EXAM:  Not recorded     IMAGING AND PROCEDURES  Imaging and Procedures for 04/12/2021             ASSESSMENT/PLAN:    ICD-10-CM   1. Cystoid macular edema of left eye  H35.352     2. History of retinal detachment  Z86.69     3. Epiretinal membrane (ERM) of both eyes  H35.373     4. Posterior vitreous detachment of right eye  H43.811     5. Essential hypertension  I10     6. Hypertensive retinopathy of both eyes  H35.033     7. Pseudophakia, both eyes  Z96.1     8. Primary open angle glaucoma of both eyes, unspecified glaucoma stage  H40.1130     9. Dry eyes  H04.123       1. CME OS  - pt with history of complicated CEIOL OS w/ RD in 2016  - history of CME since 2016 previously managed by Dr. Armanda Heritage   - was receiving intravitreal injections per pt report -- last visit w/ Dr. Posey Pronto in January 2021  - started on PF and Prolensa QID OS here on 09.02.21  - stopped latanoprost on 12.14.21  - BCVA OS slightly decreased to 20/60 from 20/50  - OCT shows stable improvement in CME  - history of glaucoma / steroid response and was previously on timolol qdaily OU, Alphagan P BID OU, and Latanoprost qhs OU  - pt reports no longer taking timolol or latanoprost per Dr. Rick Duff - pt reports taking PF BID  OS and Prolensa qdaily -- was supposed to be qdaily for both - IOP elevated to 26,27 OS -- ?steroid response - will start Vyzulta qhs OS due to increased IOP today  - cont Prolensa qdaily, but will switch PF to Lotemax SM qdaily OS to address possible steroid response  -  discussed possible need for low dose, long-term maintenance steroid/Prolensa, given history of recurrent CME  - f/u 2-3 weeks, IOP check, DFE, OCT  2. History of RD OS  - 2016 after cataract surgery Manuella Ghazi)  - s/p PPV w/ Dr. Posey Pronto in 2016  - retina attached; appears stable  - monitor  3. Epiretinal membrane, both eyes  - The natural history, anatomy, potential for loss of vision, and treatment options including vitrectomy techniques and the complications of endophthalmitis, retinal detachment, vitreous hemorrhage, cataract progression and permanent vision loss discussed with the patient. - mild ERM OU - BCVA OD: 20/20; OS: 20/60 - asymptomatic, no metamorphopsia - OCT shows mild progression of ERM OU and interval blunting of foveal contour - no indication for surgery at this time - monitor for now  4. PVD / vitreous syneresis OD+  - symptomatic floaters OD -- improved  - Discussed findings and prognosis  - No RT or RD on 360 peripheral exam  - Reviewed s/s of RT/RD  - Strict return precautions for any such RT/RD signs/symptoms  - f/u in 6 weeks DFE, OCT  5,6. Hypertensive retinopathy OU - discussed importance of tight BP control - monitor  7. Pseudophakia OU  - s/p CE/IOL (OD: Dr. Talbert Forest, OS: Dr. Ruthell Rummage, 2016)  - s/p YAG OS -- Bevis, May 2021  - OS with iridonesis and poor dilation - monitor  8. POAG OU  - IOP 16,25 today -- ?steroid response  - currently on timolol qdaily OU, Alphagan P BID OU -- pt not taking timolol  - pt has stopped latan OU as above -- will restart Vyzulta qhs OS today due to increased IOP  - pt reports irritation with timolol  9. Dry eyes OU - recommend artificial tears and  lubricating ointment as needed  Ophthalmic Meds Ordered this visit:  No orders of the defined types were placed in this encounter.    No follow-ups on file.  There are no Patient Instructions on file for this visit.   This document serves as a record of services personally performed by Gardiner Sleeper, MD, PhD. It was created on their behalf by Roselee Nova, COMT. The creation of this record is the provider's dictation and/or activities during the visit.  Electronically signed by: Roselee Nova, COMT 04/08/21 9:11 AM   Gardiner Sleeper, M.D., Ph.D. Diseases & Surgery of the Retina and Vitreous [facility]: M myopia (nearsighted); A astigmatism; H hyperopia (farsighted); P presbyopia; Mrx spectacle prescription;  CTL contact lenses; OD right eye; OS left eye; OU both eyes  XT exotropia; ET esotropia; PEK punctate epithelial keratitis; PEE punctate epithelial erosions; DES dry eye syndrome; MGD meibomian gland dysfunction; ATs artificial tears; PFAT's preservative free artificial tears; [place] nuclear sclerotic cataract; PSC posterior subcapsular cataract; ERM epi-retinal membrane; PVD posterior vitreous detachment; RD retinal detachment; DM diabetes mellitus; DR diabetic retinopathy; NPDR non-proliferative diabetic retinopathy; PDR proliferative diabetic retinopathy; CSME clinically significant macular edema; DME diabetic macular edema; dbh dot blot hemorrhages; CWS cotton wool spot; POAG primary open angle glaucoma; C/D cup-to-disc ratio; HVF humphrey visual field; GVF goldmann visual field; OCT optical coherence tomography; IOP intraocular pressure; BRVO Branch retinal vein occlusion; CRVO central retinal vein occlusion; CRAO central retinal artery occlusion; BRAO branch retinal artery occlusion; RT retinal tear; SB scleral buckle; PPV pars plana vitrectomy; VH Vitreous hemorrhage; PRP panretinal laser photocoagulation; IVK intravitreal kenalog; VMT  vitreomacular traction; MH Macular hole;  NVD neovascularization  of the disc; NVE neovascularization elsewhere; AREDS age related eye disease study; ARMD age related macular degeneration; POAG primary open angle glaucoma; EBMD epithelial/anterior basement membrane dystrophy; ACIOL anterior chamber intraocular lens; IOL intraocular lens; PCIOL posterior chamber intraocular lens; Phaco/IOL phacoemulsification with intraocular lens placement; [place] photorefractive keratectomy; LASIK laser assisted in situ keratomileusis; HTN hypertension; DM diabetes mellitus; COPD chronic obstructive pulmonary disease

## 2021-04-12 ENCOUNTER — Other Ambulatory Visit (INDEPENDENT_AMBULATORY_CARE_PROVIDER_SITE_OTHER): Payer: Self-pay

## 2021-04-12 ENCOUNTER — Encounter (INDEPENDENT_AMBULATORY_CARE_PROVIDER_SITE_OTHER): Payer: Medicare Other | Admitting: Ophthalmology

## 2021-04-12 MED ORDER — VYZULTA 0.024 % OP SOLN: 1.0000 [drp] | Freq: Every day | OPHTHALMIC | 3 refills | Status: DC

## 2021-04-14 NOTE — Progress Notes (Shared)
[facility] Note  04/19/2021     CHIEF COMPLAINT Patient presents for No chief complaint on file.    HISTORY OF PRESENT ILLNESS: Garrett Green is a 76 y.o. male who presents to the clinic today for:     Pt reports using PF BID OS and Prolensa Qdaily OS as well as Alphagan P BID OS, pt states he is no longer using timolol bc it was making his eye red   Referring physician: Birdie Sons, MD [address] [place],  [place] [address]  HISTORICAL INFORMATION:   Selected notes from the MEDICAL RECORD NUMBER Referred by Dr. Rick Duff   CURRENT MEDICATIONS: Current Outpatient Medications (Ophthalmic Drugs)  Medication Sig   brimonidine (ALPHAGAN P) 0.1 % SOLN Place 1 drop into both eyes 2 (two) times daily.   Bromfenac Sodium (PROLENSA) 0.07 % SOLN Place 1 drop into the left eye daily.   Latanoprostene Bunod (VYZULTA) 0.024 % SOLN Place 1 drop into both eyes at bedtime.   Loteprednol Etabonate (LOTEMAX SM) 0.38 % GEL Place 1 drop into the left eye daily.   prednisoLONE acetate (PRED FORTE) 1 % ophthalmic suspension Place 1 drop into the left eye daily.   No current facility-administered medications for this visit. (Ophthalmic Drugs)   Current Outpatient Medications (Other)  Medication Sig   aspirin 325 MG tablet Take 325 mg by mouth daily.   atorvastatin (LIPITOR) 40 MG tablet Take 1 tablet (40 mg total) by mouth daily.   clopidogrel (PLAVIX) 75 MG tablet TAKE 1 TABLET BY MOUTH EVERY DAY   fluticasone (FLONASE) 50 MCG/ACT nasal spray Place 2 sprays into both nostrils daily as needed for allergies.   HYDROcodone-acetaminophen (NORCO/VICODIN) 5-325 MG tablet Take 1 tablet by mouth every 6 (six) hours as needed.   loratadine (CLARITIN) 10 MG tablet Take 10 mg by mouth daily as needed for allergies.   ramipril (ALTACE) 10 MG capsule TAKE 1 CAPSULE BY MOUTH  DAILY   zolpidem (AMBIEN) 10 MG tablet Take 0.5-1 tablets (5-10 mg total) by  mouth at bedtime as needed for sleep.   No current facility-administered medications for this visit. (Other)   REVIEW OF SYSTEMS:   ALLERGIES Allergies  Allergen Reactions   Sulfa Antibiotics Other (See Comments)    Lethargic and hypotension    PAST MEDICAL HISTORY Past Medical History:  Diagnosis Date   COVID-19 07/2019   History of blood transfusion 2014   "related to back OR"   History of chicken pox    Hypertensive retinopathy    OU   Retinal detachment    OS   Stroke ([address])    TIA (transient ischemic attack) 10/17/2017; 10/19/2017   Past Surgical History:  Procedure Laterality Date   BACK SURGERY     CATARACT EXTRACTION Right 05/2019   Dr. Talbert Forest   CATARACT EXTRACTION Left 2017   CATARACT EXTRACTION W/ INTRAOCULAR LENS IMPLANT Left    COLONOSCOPY WITH PROPOFOL N/A 03/16/2016   Procedure: COLONOSCOPY WITH PROPOFOL;  Surgeon: Manya Silvas, MD;  Location: [facility] ENDOSCOPY;  Service: Endoscopy;  Laterality: N/A;   ESOPHAGOGASTRODUODENOSCOPY (EGD) WITH ESOPHAGEAL DILATION  1990s   EYE SURGERY Bilateral    Cat Sx   IR ANGIO INTRA EXTRACRAN SEL COM CAROTID INNOMINATE BILAT MOD SED  10/31/2017   IR ANGIO INTRA EXTRACRAN SEL COM CAROTID INNOMINATE BILAT MOD SED  01/15/2019   IR ANGIO INTRA EXTRACRAN SEL COM CAROTID INNOMINATE BILAT MOD SED  09/27/2019  IR ANGIO VERTEBRAL SEL VERTEBRAL BILAT MOD SED  10/31/2017   IR ANGIO VERTEBRAL SEL VERTEBRAL BILAT MOD SED  01/15/2019   IR ANGIO VERTEBRAL SEL VERTEBRAL BILAT MOD SED  09/27/2019   IR US GUIDE VASC ACCESS RIGHT  01/15/2019   IR US GUIDE VASC ACCESS RIGHT  09/27/2019   LUMBAR LAMINECTOMY/DECOMPRESSION MICRODISCECTOMY Left 07/26/2012   Procedure: LUMBAR LAMINECTOMY/DECOMPRESSION MICRODISCECTOMY 1 LEVEL;  Surgeon: Eustace Moore, MD;  Location: [facility];  Service: Neurosurgery;  Laterality: Left;  lumbar five sacral one   RETINAL DETACHMENT SURGERY Left 2016   Dr. Posey Pronto   SHOULDER ARTHROSCOPY WITH OPEN ROTATOR CUFF REPAIR Left  1990s   TONSILLECTOMY     YAG LASER APPLICATION Left 12/8117   Dr. Talbert Forest    FAMILY HISTORY Family History  Problem Relation Age of Onset   Heart disease Mother    Cancer Sister    Dementia Brother    Dementia Sister    Diabetes Brother    Diabetes Sister    Colon cancer Neg Hx    Prostate cancer Neg Hx     SOCIAL HISTORY Social History   Tobacco Use   Smoking status: Former    Packs/day: 1.00    Years: 25.00    Pack years: 25.00    Types: Cigarettes    Quit date: 03/03/1988    Years since quitting: 33.1   Smokeless tobacco: Never  Vaping Use   Vaping Use: Never used  Substance Use Topics   Alcohol use: Yes    Alcohol/week: 1.0 standard drink    Types: 1 Standard drinks or equivalent per week   Drug use: Never       OPHTHALMIC EXAM:  Not recorded     IMAGING AND PROCEDURES  Imaging and Procedures for 04/19/2021             ASSESSMENT/PLAN:    ICD-10-CM   1. Cystoid macular edema of left eye  H35.352     2. History of retinal detachment  Z86.69     3. Epiretinal membrane (ERM) of both eyes  H35.373     4. Posterior vitreous detachment of right eye  H43.811     5. Essential hypertension  I10     6. Hypertensive retinopathy of both eyes  H35.033     7. Pseudophakia, both eyes  Z96.1     8. Primary open angle glaucoma of both eyes, unspecified glaucoma stage  H40.1130     9. Dry eyes  H04.123       1. CME OS  - pt with history of complicated CEIOL OS w/ RD in 2016  - history of CME since 2016 previously managed by Dr. Armanda Heritage   - was receiving intravitreal injections per pt report -- last visit w/ Dr. Posey Pronto in January 2021  - started on PF and Prolensa QID OS here on 09.02.21  - stopped latanoprost on 12.14.21  - BCVA OS slightly decreased to 20/60 from 20/50  - OCT shows stable improvement in CME  - history of glaucoma / steroid response and was previously on timolol qdaily OU, Alphagan P BID OU, and Latanoprost qhs OU  - pt  reports no longer taking timolol or latanoprost per Dr. Rick Duff - pt reports taking PF BID OS and Prolensa qdaily -- was supposed to be qdaily for both - IOP elevated to 26,27 OS -- ?steroid response - will start Vyzulta qhs OS due to increased IOP today  - cont Prolensa qdaily,  but will switch PF to Lotemax SM qdaily OS to address possible steroid response  - discussed possible need for low dose, long-term maintenance steroid/Prolensa, given history of recurrent CME  - f/u 2-3 weeks, IOP check, DFE, OCT  2. History of RD OS  - 2016 after cataract surgery Manuella Ghazi)  - s/p PPV w/ Dr. Posey Pronto in 2016  - retina attached; appears stable  - monitor  3. Epiretinal membrane, both eyes  - The natural history, anatomy, potential for loss of vision, and treatment options including vitrectomy techniques and the complications of endophthalmitis, retinal detachment, vitreous hemorrhage, cataract progression and permanent vision loss discussed with the patient. - mild ERM OU - BCVA OD: 20/20; OS: 20/60 - asymptomatic, no metamorphopsia - OCT shows mild progression of ERM OU and interval blunting of foveal contour - no indication for surgery at this time - monitor for now  4. PVD / vitreous syneresis OD+  - symptomatic floaters OD -- improved  - Discussed findings and prognosis  - No RT or RD on 360 peripheral exam  - Reviewed s/s of RT/RD  - Strict return precautions for any such RT/RD signs/symptoms  - f/u in 6 weeks---DFE, OCT  5,6. Hypertensive retinopathy OU - discussed importance of tight BP control - monitor  7. Pseudophakia OU  - s/p CE/IOL (OD: Dr. Talbert Forest, OS: Dr. Ruthell Rummage, 2016)  - s/p YAG OS -- Bevis, May 2021  - OS with iridonesis and poor dilation - monitor  8. POAG OU  - IOP 16,25 today -- ?steroid response  - currently on timolol qdaily OU, Alphagan P BID OU -- pt not taking timolol  - pt has stopped latan OU as above -- will restart Vyzulta qhs OS today due to increased  IOP  - pt reports irritation w/ timolol  9. Dry eyes OU - recommend artificial tears and lubricating ointment as needed  Ophthalmic Meds Ordered this visit:  No orders of the defined types were placed in this encounter.    No follow-ups on file.  There are no Patient Instructions on file for this visit.   This document serves as a record of services personally performed by Gardiner Sleeper, MD, PhD. It was created on their behalf by Roselee Nova, COMT. The creation of this record is the provider's dictation and/or activities during the visit.  Electronically signed by: Roselee Nova, COMT 04/14/21 10:47 AM    Gardiner Sleeper, M.D., Ph.D. Diseases & Surgery of the Retina and Vitreous [facility]: M myopia (nearsighted); A astigmatism; H hyperopia (farsighted); P presbyopia; Mrx spectacle prescription;  CTL contact lenses; OD right eye; OS left eye; OU both eyes  XT exotropia; ET esotropia; PEK punctate epithelial keratitis; PEE punctate epithelial erosions; DES dry eye syndrome; MGD meibomian gland dysfunction; ATs artificial tears; PFAT's preservative free artificial tears; [place] nuclear sclerotic cataract; PSC posterior subcapsular cataract; ERM epi-retinal membrane; PVD posterior vitreous detachment; RD retinal detachment; DM diabetes mellitus; DR diabetic retinopathy; NPDR non-proliferative diabetic retinopathy; PDR proliferative diabetic retinopathy; CSME clinically significant macular edema; DME diabetic macular edema; dbh dot blot hemorrhages; CWS cotton wool spot; POAG primary open angle glaucoma; C/D cup-to-disc ratio; HVF humphrey visual field; GVF goldmann visual field; OCT optical coherence tomography; IOP intraocular pressure; BRVO Branch retinal vein occlusion; CRVO central retinal vein occlusion; CRAO central retinal artery occlusion; BRAO branch retinal artery occlusion; RT retinal tear; SB scleral buckle; PPV pars plana vitrectomy; VH  Vitreous hemorrhage;  PRP panretinal laser photocoagulation; IVK intravitreal kenalog; VMT vitreomacular traction; MH Macular hole;  NVD neovascularization of the disc; NVE neovascularization elsewhere; AREDS age related eye disease study; ARMD age related macular degeneration; POAG primary open angle glaucoma; EBMD epithelial/anterior basement membrane dystrophy; ACIOL anterior chamber intraocular lens; IOL intraocular lens; PCIOL posterior chamber intraocular lens; Phaco/IOL phacoemulsification with intraocular lens placement; [place] photorefractive keratectomy; LASIK laser assisted in situ keratomileusis; HTN hypertension; DM diabetes mellitus; COPD chronic obstructive pulmonary disease

## 2021-04-15 NOTE — Progress Notes (Signed)
[facility] Note  04/16/2021     CHIEF COMPLAINT Patient presents for Retina Follow Up    HISTORY OF PRESENT ILLNESS: Garrett Green is a 76 y.o. male who presents to the clinic today for:   HPI     Retina Follow Up   Patient presents with  Other.  In left eye.  This started 3 weeks ago.  I, the attending physician,  performed the HPI with the patient and updated documentation appropriately.        Comments   Patient here for 3 weeks retina follow up for CME OS, IOP check OS. Patient states vision about the same. OS has a little bit of pain. Has been using drops but not this am it blurs vision.       Last edited by Bernarda Caffey, MD on 04/16/2021  8:45 AM.    Pt missed his appt on Monday bc he was sick, he is using Alphagan P BID OS, Vyzulta QHS OU, Lotemax and Prolensa both QD OS, pt did not use drops this morning bc they blur his vision   Referring physician: Birdie Sons, MD [address] [place],  [place] [address]  HISTORICAL INFORMATION:   Selected notes from the MEDICAL RECORD NUMBER Referred by Dr. Rick Duff   CURRENT MEDICATIONS: Current Outpatient Medications (Ophthalmic Drugs)  Medication Sig   brimonidine (ALPHAGAN P) 0.1 % SOLN Place 1 drop into both eyes 2 (two) times daily.   Bromfenac Sodium (PROLENSA) 0.07 % SOLN Place 1 drop into the left eye daily.   Latanoprostene Bunod (VYZULTA) 0.024 % SOLN Place 1 drop into both eyes at bedtime.   Loteprednol Etabonate (LOTEMAX SM) 0.38 % GEL Place 1 drop into the left eye daily.   prednisoLONE acetate (PRED FORTE) 1 % ophthalmic suspension Place 1 drop into the left eye daily.   No current facility-administered medications for this visit. (Ophthalmic Drugs)   Current Outpatient Medications (Other)  Medication Sig   aspirin 325 MG tablet Take 325 mg by mouth daily.   atorvastatin (LIPITOR) 40 MG tablet Take 1 tablet (40 mg total) by mouth daily.   clopidogrel  (PLAVIX) 75 MG tablet TAKE 1 TABLET BY MOUTH EVERY DAY   fluticasone (FLONASE) 50 MCG/ACT nasal spray Place 2 sprays into both nostrils daily as needed for allergies.   HYDROcodone-acetaminophen (NORCO/VICODIN) 5-325 MG tablet Take 1 tablet by mouth every 6 (six) hours as needed.   loratadine (CLARITIN) 10 MG tablet Take 10 mg by mouth daily as needed for allergies.   ramipril (ALTACE) 10 MG capsule TAKE 1 CAPSULE BY MOUTH  DAILY   zolpidem (AMBIEN) 10 MG tablet Take 0.5-1 tablets (5-10 mg total) by mouth at bedtime as needed for sleep.   No current facility-administered medications for this visit. (Other)   REVIEW OF SYSTEMS: ROS   Positive for: Gastrointestinal, Neurological, Musculoskeletal, Cardiovascular, Eyes Negative for: Constitutional, Skin, Genitourinary, HENT, Endocrine, Respiratory, Psychiatric, Allergic/Imm, Heme/Lymph Last edited by Theodore Demark, COA on 04/16/2021  7:55 AM.     ALLERGIES Allergies  Allergen Reactions   Sulfa Antibiotics Other (See Comments)    Lethargic and hypotension   PAST MEDICAL HISTORY Past Medical History:  Diagnosis Date   COVID-19 07/2019   History of blood transfusion 2014   "related to back OR"   History of chicken pox    Hypertensive retinopathy    OU   Retinal detachment    OS   Stroke (  [place])    TIA (transient ischemic attack) 10/17/2017; 10/19/2017   Past Surgical History:  Procedure Laterality Date   BACK SURGERY     CATARACT EXTRACTION Right 05/2019   Dr. Talbert Forest   CATARACT EXTRACTION Left 2017   CATARACT EXTRACTION W/ INTRAOCULAR LENS IMPLANT Left    COLONOSCOPY WITH PROPOFOL N/A 03/16/2016   Procedure: COLONOSCOPY WITH PROPOFOL;  Surgeon: Manya Silvas, MD;  Location: [facility] ENDOSCOPY;  Service: Endoscopy;  Laterality: N/A;   ESOPHAGOGASTRODUODENOSCOPY (EGD) WITH ESOPHAGEAL DILATION  1990s   EYE SURGERY Bilateral    Cat Sx   IR ANGIO INTRA EXTRACRAN SEL COM CAROTID INNOMINATE BILAT MOD SED  10/31/2017   IR ANGIO INTRA  EXTRACRAN SEL COM CAROTID INNOMINATE BILAT MOD SED  01/15/2019   IR ANGIO INTRA EXTRACRAN SEL COM CAROTID INNOMINATE BILAT MOD SED  09/27/2019   IR ANGIO VERTEBRAL SEL VERTEBRAL BILAT MOD SED  10/31/2017   IR ANGIO VERTEBRAL SEL VERTEBRAL BILAT MOD SED  01/15/2019   IR ANGIO VERTEBRAL SEL VERTEBRAL BILAT MOD SED  09/27/2019   IR US GUIDE VASC ACCESS RIGHT  01/15/2019   IR US GUIDE VASC ACCESS RIGHT  09/27/2019   LUMBAR LAMINECTOMY/DECOMPRESSION MICRODISCECTOMY Left 07/26/2012   Procedure: LUMBAR LAMINECTOMY/DECOMPRESSION MICRODISCECTOMY 1 LEVEL;  Surgeon: Eustace Moore, MD;  Location: [facility];  Service: Neurosurgery;  Laterality: Left;  lumbar five sacral one   RETINAL DETACHMENT SURGERY Left 2016   Dr. Posey Pronto   SHOULDER ARTHROSCOPY WITH OPEN ROTATOR CUFF REPAIR Left 1990s   TONSILLECTOMY     YAG LASER APPLICATION Left 04/270   Dr. Talbert Forest   FAMILY HISTORY Family History  Problem Relation Age of Onset   Heart disease Mother    Cancer Sister    Dementia Brother    Dementia Sister    Diabetes Brother    Diabetes Sister    Colon cancer Neg Hx    Prostate cancer Neg Hx    SOCIAL HISTORY Social History   Tobacco Use   Smoking status: Former    Packs/day: 1.00    Years: 25.00    Pack years: 25.00    Types: Cigarettes    Quit date: 03/03/1988    Years since quitting: 33.1   Smokeless tobacco: Never  Vaping Use   Vaping Use: Never used  Substance Use Topics   Alcohol use: Yes    Alcohol/week: 1.0 standard drink    Types: 1 Standard drinks or equivalent per week   Drug use: Never       OPHTHALMIC EXAM:  Base Eye Exam     Visual Acuity (Snellen - Linear)       Right Left   Dist [place] 20/20 -2 20/40 -2   Dist ph [place]  NI         Tonometry (Tonopen, 7:52 AM)       Right Left   Pressure 13 22         Pupils       Dark Light Shape React APD   Right 2 1 Round Minimal None   Left 2 1 Round Minimal None         Visual Fields (Counting fingers)       Left Right     Full Full         Extraocular Movement       Right Left    Full, Ortho Full, Ortho         Neuro/Psych     Oriented x3: Yes  Mood/Affect: Normal         Dilation     Both eyes: 1.0% Mydriacyl, 2.5% Phenylephrine @ 7:52 AM           Slit Lamp and Fundus Exam     Slit Lamp Exam       Right Left   Lids/Lashes Dermatochalasis - upper lid Dermatochalasis - upper lid   Conjunctiva/Sclera Mild nasal Pinguecula 1-2+ Injection, prominent limbal vessels nasally   Cornea Mild arcus, well healed temporal cataract wounds, 1-2+ inferior Punctate epithelial erosions Mild arcus, well healed temporal cataract wounds, trace Punctate epithelial erosions   Anterior Chamber Deep and quiet Deep, 0.5+cell/pigment   Iris Round and dilated mild iridodonesis, Round and poorly dilated to 3.0   Lens PC IOL in good position PC IOL with fine pigment depositon, open PC   Anterior Vitreous Vitreous syneresis, Posterior vitreous detachment, vitreous condensations Vitreous syneresis         Fundus Exam       Right Left   Disc Pink and Sharp Pink and Sharp, +PPA, +cupping   C/D Ratio 0.4 0.7   Macula Flat, Blunted foveal reflex, ERM, mild RPE mottling and clumping, No heme or edema Blunted foveal reflex, central CME - stably improved, +ERM, RPE mottling and clumping, no heme   Vessels Attenuated, tortuous, mild AV crossing changes, mild Copper wiring Attenuated, mild tortuousity   Periphery Attached    limited view due to poor dilation, attached, no heme, inferior paving stone degeneration / CR atrophy            IMAGING AND PROCEDURES  Imaging and Procedures for 04/16/2021  OCT, Retina - OU - Both Eyes       Right Eye Quality was good. Central Foveal Thickness: 355. Progression has been stable. Findings include no IRF, no SRF, abnormal foveal contour, epiretinal membrane (Mild ERM with blunting of foveal contour).   Left Eye Quality was good. Central Foveal Thickness: 346.  Progression has been stable. Findings include abnormal foveal contour, epiretinal membrane, no SRF, no IRF, macular pucker (Mild ERM with blunted foveal profile and mild pucker, stable improvement in IRF/SRF).   Notes *Images captured and stored on drive  Diagnosis / Impression:  OD: Mild ERM with blunting of central foveal contour OS: Mild ERM with blunted foveal profile and mild pucker, stable improvement in IRF/SRF  Clinical management:  See below  Abbreviations: NFP - Normal foveal profile. CME - cystoid macular edema. PED - pigment epithelial detachment. IRF - intraretinal fluid. SRF - subretinal fluid. EZ - ellipsoid zone. ERM - epiretinal membrane. ORA - outer retinal atrophy. ORT - outer retinal tubulation. SRHM - subretinal hyper-reflective material. IRHM - intraretinal hyper-reflective material            ASSESSMENT/PLAN:    ICD-10-CM   1. Cystoid macular edema of left eye  H35.352 OCT, Retina - OU - Both Eyes    2. History of retinal detachment  Z86.69     3. Epiretinal membrane (ERM) of both eyes  H35.373     4. Posterior vitreous detachment of right eye  H43.811     5. Essential hypertension  I10     6. Hypertensive retinopathy of both eyes  H35.033     7. Pseudophakia, both eyes  Z96.1     8. Primary open angle glaucoma of both eyes, unspecified glaucoma stage  H40.1130     9. Dry eyes  H04.123      1. CME OS  -  pt with history of complicated CEIOL OS w/ RD in 2016  - history of CME since 2016 previously managed by Dr. Armanda Heritage   - was receiving intravitreal injections per pt report -- last visit w/ Dr. Posey Pronto in January 2021  - started on PF and Prolensa QID OS here on 09.02.21  - stopped latanoprost on 12.14.21  - BCVA OS improved to 20/40 from 20/60  - OCT shows stable improvement in CME  - history of glaucoma / steroid response and was previously on timolol qdaily OU, Alphagan P BID OU, and Latanoprost qhs OU  - pt reports no longer taking timolol  or latanoprost per Dr. Rick Duff - pt reports taking Lotemax SM and Prolensa qdaily -- okay to stop Lotemax - IOP elevated to 22 OS -- ?steroid response but improved - cont Vyzulta qhs OU due to increased IOP today  - discussed possible need for low dose, long-term maintenance steroid/Prolensa, given history of recurrent CME  - pt did not use drops this morning  - f/u 2-3 weeks, IOP check, DFE, OCT  2. History of RD OS  - 2016 after cataract surgery Manuella Ghazi)  - s/p PPV w/ Dr. Posey Pronto in 2016  - retina attached; appears stable  - monitor  3. Epiretinal membrane, both eyes  - mild ERM OU - BCVA OD: 20/20; OS: 20/40 - asymptomatic, no metamorphopsia - OCT shows mild progression of ERM OU and interval blunting of foveal contour - no indication for surgery at this time - monitor for now  4. PVD / vitreous syneresis OD+  - symptomatic floaters OD -- improved  - Discussed findings and prognosis  - No RT or RD on 360 peripheral exam  - Reviewed s/s of RT/RD  - Strict return precautions for any such RT/RD signs/symptoms  - f/u in 6 wks -- DFE/OCT  5,6. Hypertensive retinopathy OU - discussed importance of tight BP control - monitor  7. Pseudophakia OU  - s/p CE/IOL (OD: Dr. Talbert Forest, OS: Dr. Ruthell Rummage, 2016)  - s/p YAG OS -- [place], May 2021  - OS with iridonesis and poor dilation - monitor  8. POAG OU  - IOP 13,22 today -- ?steroid response  - currently on Alphagan P BID OS, vyzulta BID OU  - history of timolol causing irritation  - pt did not use drops this morning  9. Dry eyes OU - recommend artificial tears and lubricating ointment as needed  Ophthalmic Meds Ordered this visit:  No orders of the defined types were placed in this encounter.    Return for f/u 2-3 weeks, CME OS, IOP check, DFE, OCT.  There are no Patient Instructions on file for this visit.   This document serves as a record of services personally performed by Gardiner Sleeper, MD, PhD. It was created on their  behalf by Orvan Falconer, an ophthalmic technician. The creation of this record is the provider's dictation and/or activities during the visit.    Electronically signed by: Orvan Falconer, OA, 04/16/21  8:54 AM  This document serves as a record of services personally performed by Gardiner Sleeper, MD, PhD. It was created on their behalf by San Jetty. Owens Shark, OA an ophthalmic technician. The creation of this record is the provider's dictation and/or activities during the visit.    Electronically signed by: San Jetty. Owens Shark, [place] 01.06.2023 8:54 AM    Gardiner Sleeper, M.D., Ph.D. Diseases & Surgery of the Retina and [place]  Abbreviations: M myopia (nearsighted); A astigmatism; H hyperopia (farsighted); P presbyopia; Mrx spectacle prescription;  CTL contact lenses; OD right eye; OS left eye; OU both eyes  XT exotropia; ET esotropia; PEK punctate epithelial keratitis; PEE punctate epithelial erosions; DES dry eye syndrome; MGD meibomian gland dysfunction; ATs artificial tears; PFAT's preservative free artificial tears; [place] nuclear sclerotic cataract; PSC posterior subcapsular cataract; ERM epi-retinal membrane; PVD posterior vitreous detachment; RD retinal detachment; DM diabetes mellitus; DR diabetic retinopathy; NPDR non-proliferative diabetic retinopathy; PDR proliferative diabetic retinopathy; CSME clinically significant macular edema; DME diabetic macular edema; dbh dot blot hemorrhages; CWS cotton wool spot; POAG primary open angle glaucoma; C/D cup-to-disc ratio; HVF humphrey visual field; GVF goldmann visual field; OCT optical coherence tomography; IOP intraocular pressure; BRVO Branch retinal vein occlusion; CRVO central retinal vein occlusion; CRAO central retinal artery occlusion; BRAO branch retinal artery occlusion; RT retinal tear; SB scleral buckle; PPV pars plana vitrectomy; VH Vitreous hemorrhage; PRP panretinal laser photocoagulation; IVK  intravitreal kenalog; VMT vitreomacular traction; MH Macular hole;  NVD neovascularization of the disc; NVE neovascularization elsewhere; AREDS age related eye disease study; ARMD age related macular degeneration; POAG primary open angle glaucoma; EBMD epithelial/anterior basement membrane dystrophy; ACIOL anterior chamber intraocular lens; IOL intraocular lens; PCIOL posterior chamber intraocular lens; Phaco/IOL phacoemulsification with intraocular lens placement; [place] photorefractive keratectomy; LASIK laser assisted in situ keratomileusis; HTN hypertension; DM diabetes mellitus; COPD chronic obstructive pulmonary disease

## 2021-04-16 ENCOUNTER — Ambulatory Visit (INDEPENDENT_AMBULATORY_CARE_PROVIDER_SITE_OTHER): Payer: Medicare Other | Admitting: Ophthalmology

## 2021-04-16 ENCOUNTER — Other Ambulatory Visit: Payer: Self-pay

## 2021-04-16 ENCOUNTER — Encounter (INDEPENDENT_AMBULATORY_CARE_PROVIDER_SITE_OTHER): Payer: Self-pay | Admitting: Ophthalmology

## 2021-04-16 DIAGNOSIS — H35373 Puckering of macula, bilateral: Secondary | ICD-10-CM | POA: Diagnosis not present

## 2021-04-16 DIAGNOSIS — I10 Essential (primary) hypertension: Secondary | ICD-10-CM

## 2021-04-16 DIAGNOSIS — H35352 Cystoid macular degeneration, left eye: Secondary | ICD-10-CM | POA: Diagnosis not present

## 2021-04-16 DIAGNOSIS — H43811 Vitreous degeneration, right eye: Secondary | ICD-10-CM

## 2021-04-16 DIAGNOSIS — H35033 Hypertensive retinopathy, bilateral: Secondary | ICD-10-CM

## 2021-04-16 DIAGNOSIS — H401130 Primary open-angle glaucoma, bilateral, stage unspecified: Secondary | ICD-10-CM

## 2021-04-16 DIAGNOSIS — Z961 Presence of intraocular lens: Secondary | ICD-10-CM

## 2021-04-16 DIAGNOSIS — H04123 Dry eye syndrome of bilateral lacrimal glands: Secondary | ICD-10-CM

## 2021-04-16 DIAGNOSIS — Z8669 Personal history of other diseases of the nervous system and sense organs: Secondary | ICD-10-CM

## 2021-04-19 ENCOUNTER — Encounter (INDEPENDENT_AMBULATORY_CARE_PROVIDER_SITE_OTHER): Payer: Medicare Other | Admitting: Ophthalmology

## 2021-04-19 DIAGNOSIS — Z8669 Personal history of other diseases of the nervous system and sense organs: Secondary | ICD-10-CM

## 2021-04-19 DIAGNOSIS — I10 Essential (primary) hypertension: Secondary | ICD-10-CM

## 2021-04-19 DIAGNOSIS — H43811 Vitreous degeneration, right eye: Secondary | ICD-10-CM

## 2021-04-19 DIAGNOSIS — Z961 Presence of intraocular lens: Secondary | ICD-10-CM

## 2021-04-19 DIAGNOSIS — H35373 Puckering of macula, bilateral: Secondary | ICD-10-CM

## 2021-04-19 DIAGNOSIS — H35352 Cystoid macular degeneration, left eye: Secondary | ICD-10-CM

## 2021-04-19 DIAGNOSIS — H04123 Dry eye syndrome of bilateral lacrimal glands: Secondary | ICD-10-CM

## 2021-04-19 DIAGNOSIS — H401130 Primary open-angle glaucoma, bilateral, stage unspecified: Secondary | ICD-10-CM

## 2021-04-19 DIAGNOSIS — H35033 Hypertensive retinopathy, bilateral: Secondary | ICD-10-CM

## 2021-05-04 NOTE — Progress Notes (Addendum)
[facility] Note  05/07/2021     CHIEF COMPLAINT Patient presents for Retina Follow Up    HISTORY OF PRESENT ILLNESS: Garrett Green is a 76 y.o. male who presents to the clinic today for:   HPI     Retina Follow Up   Severity is moderate.  I, the attending physician,  performed the HPI with the patient and updated documentation appropriately.        Comments   Pt here for 3 wk ret f/u CME OS. Pt states he went to [facility], Dr. Rick Duff yesterday and had a change in gtts taken due to pressure. Currently on Alphagan P BID , Prolensa QD and Prednisolone QID OS. Pt states he ended up using 1 drop of Lotemax last Thursday and his eye reacted, became swollen. Went to see Dr. B and reports his pressure was in the 73s. Dr. B placed on the new gtts regimen. OS has improved pressure wise and reports his pressure yesterday was 26 OS. His vision in OS has not returned to normal but says he is seeing gradual improvement. Dr. B informed pt that his cornea would take several days to heal due to the gtts administered to lower his pressure.       Last edited by Bernarda Caffey, MD on 05/07/2021  2:19 PM.     Pt has been seeing Dr. Rick Duff for elevated IOP, last Friday his pressure was in the 2's, yesterday it had come down to 26, Dr. B thinks he is allergic to [facility], he states he woke up one morning after using it and his eye was swollen shut and red, he is on brimonidine BID, PF QID   Referring physician: Thelma Comp, OD [place],  [place] [address]  HISTORICAL INFORMATION:   Selected notes from the MEDICAL RECORD NUMBER Referred by Dr. Rick Duff   CURRENT MEDICATIONS: Current Outpatient Medications (Ophthalmic Drugs)  Medication Sig   brimonidine (ALPHAGAN P) 0.1 % SOLN Place 1 drop into both eyes 2 (two) times daily.   Bromfenac Sodium (PROLENSA) 0.07 % SOLN Place 1 drop into the left eye daily.   dorzolamide (TRUSOPT)  2 % ophthalmic solution Place 1 drop into the left eye 2 (two) times daily.   prednisoLONE acetate (PRED FORTE) 1 % ophthalmic suspension Place 1 drop into the left eye daily. (Patient taking differently: Place 1 drop into the left eye in the morning, at noon, in the evening, and at bedtime.)   Latanoprostene Bunod (VYZULTA) 0.024 % SOLN Place 1 drop into both eyes at bedtime. (Patient not taking: Reported on 05/07/2021)   Loteprednol Etabonate (LOTEMAX SM) 0.38 % GEL Place 1 drop into the left eye daily. (Patient not taking: Reported on 05/07/2021)   No current facility-administered medications for this visit. (Ophthalmic Drugs)   Current Outpatient Medications (Other)  Medication Sig   aspirin 325 MG tablet Take 325 mg by mouth daily.   atorvastatin (LIPITOR) 40 MG tablet Take 1 tablet (40 mg total) by mouth daily.   clopidogrel (PLAVIX) 75 MG tablet TAKE 1 TABLET BY MOUTH EVERY DAY   fluticasone (FLONASE) 50 MCG/ACT nasal spray Place 2 sprays into both nostrils daily as needed for allergies.   HYDROcodone-acetaminophen (NORCO/VICODIN) 5-325 MG tablet Take 1 tablet by mouth every 6 (six) hours as needed.   loratadine (CLARITIN) 10 MG tablet Take 10 mg by mouth daily as needed for allergies.   ramipril (ALTACE) 10 MG capsule TAKE  1 CAPSULE BY MOUTH  DAILY   zolpidem (AMBIEN) 10 MG tablet Take 0.5-1 tablets (5-10 mg total) by mouth at bedtime as needed for sleep.   No current facility-administered medications for this visit. (Other)   REVIEW OF SYSTEMS: ROS   Positive for: Gastrointestinal, Neurological, Musculoskeletal, Cardiovascular, Eyes Negative for: Constitutional, Skin, Genitourinary, HENT, Endocrine, Respiratory, Psychiatric, Allergic/Imm, Heme/Lymph Last edited by Kingsley Spittle, COT on 05/07/2021  8:27 AM.      ALLERGIES Allergies  Allergen Reactions   Sulfa Antibiotics Other (See Comments)    Lethargic and hypotension   PAST MEDICAL HISTORY Past Medical History:   Diagnosis Date   COVID-19 07/2019   History of blood transfusion 2014   "related to back OR"   History of chicken pox    Hypertensive retinopathy    OU   Retinal detachment    OS   Stroke ([place])    TIA (transient ischemic attack) 10/17/2017; 10/19/2017   Past Surgical History:  Procedure Laterality Date   BACK SURGERY     CATARACT EXTRACTION Right 05/2019   Dr. Talbert Forest   CATARACT EXTRACTION Left 2017   CATARACT EXTRACTION W/ INTRAOCULAR LENS IMPLANT Left    COLONOSCOPY WITH PROPOFOL N/A 03/16/2016   Procedure: COLONOSCOPY WITH PROPOFOL;  Surgeon: Manya Silvas, MD;  [facility];  Service: Endoscopy;  Laterality: N/A;   ESOPHAGOGASTRODUODENOSCOPY (EGD) WITH ESOPHAGEAL DILATION  1990s   EYE SURGERY Bilateral    Cat Sx   IR ANGIO INTRA EXTRACRAN SEL COM CAROTID INNOMINATE BILAT MOD SED  10/31/2017   IR ANGIO INTRA EXTRACRAN SEL COM CAROTID INNOMINATE BILAT MOD SED  01/15/2019   IR ANGIO INTRA EXTRACRAN SEL COM CAROTID INNOMINATE BILAT MOD SED  09/27/2019   IR ANGIO VERTEBRAL SEL VERTEBRAL BILAT MOD SED  10/31/2017   IR ANGIO VERTEBRAL SEL VERTEBRAL BILAT MOD SED  01/15/2019   IR ANGIO VERTEBRAL SEL VERTEBRAL BILAT MOD SED  09/27/2019   IR US GUIDE VASC ACCESS RIGHT  01/15/2019   IR US GUIDE VASC ACCESS RIGHT  09/27/2019   LUMBAR LAMINECTOMY/DECOMPRESSION MICRODISCECTOMY Left 07/26/2012   Procedure: LUMBAR LAMINECTOMY/DECOMPRESSION MICRODISCECTOMY 1 LEVEL;  Surgeon: Eustace Moore, MD;  Location: [place] [facility]URO ORS;  Service: Neurosurgery;  Laterality: Left;  lumbar five sacral one   RETINAL DETACHMENT SURGERY Left 2016   Dr. Posey Pronto   SHOULDER ARTHROSCOPY WITH OPEN ROTATOR CUFF REPAIR Left 1990s   TONSILLECTOMY     YAG LASER APPLICATION Left 81/1914   Dr. Talbert Forest   FAMILY HISTORY Family History  Problem Relation Age of Onset   Heart disease Mother    Cancer Sister    Dementia Brother    Dementia Sister    Diabetes Brother    Diabetes Sister    Colon cancer Neg Hx    Prostate  cancer Neg Hx    SOCIAL HISTORY Social History   Tobacco Use   Smoking status: Former    Packs/day: 1.00    Years: 25.00    Pack years: 25.00    Types: Cigarettes    Quit date: 03/03/1988    Years since quitting: 33.2   Smokeless tobacco: Never  Vaping Use   Vaping Use: Never used  Substance Use Topics   Alcohol use: Yes    Alcohol/week: 1.0 standard drink    Types: 1 Standard drinks or equivalent per week   Drug use: Never       OPHTHALMIC EXAM:  Base Eye Exam     Visual Acuity (Snellen -  Linear)       Right Left   Dist [place] 20/20 -1 20/400   Dist ph [place]  20/200         Tonometry (Tonopen, 8:38 AM)       Right Left   Pressure 17 36         Tonometry #2 (Tonopen, 8:38 AM)       Right Left   Pressure  33         Tonometry #3 (Applanation, 9:06 AM)       Right Left   Pressure  39         Pupils       Dark Light Shape React APD   Right 2 1 Round Minimal None   Left 2 2 Round NR None         Visual Fields (Counting fingers)       Left Right    Full Full         Extraocular Movement       Right Left    Full, Ortho Full, Ortho         Neuro/Psych     Oriented x3: Yes   Mood/Affect: Normal           Slit Lamp and Fundus Exam     Slit Lamp Exam       Right Left   Lids/Lashes Dermatochalasis - upper lid Dermatochalasis - upper lid, no edema   Conjunctiva/Sclera Mild nasal Pinguecula White and quiet   Cornea Mild arcus, well healed temporal cataract wounds, 1-2+ inferior Punctate epithelial erosions Mild arcus, well healed temporal cataract wounds, 3-4+ Punctate epithelial erosions, irregular epi   Anterior Chamber Deep and quiet Deep, 1+cell/pigment   Iris Round and dilated mild iridodonesis, Round and poorly dilated to 3.0   Lens PC IOL in good position PC IOL with fine pigment depositon, open PC   Anterior Vitreous Vitreous syneresis, Posterior vitreous detachment, vitreous condensations Vitreous syneresis          Fundus Exam       Right Left   Disc Pink and Sharp Pink and Sharp, +PPA, +cupping   C/D Ratio 0.4 0.7   Macula Flat, Blunted foveal reflex, ERM, mild RPE mottling and clumping, No heme or edema Blunted foveal reflex, central CME - stably improved, +ERM, RPE mottling and clumping, no heme   Vessels Attenuated, tortuous, mild AV crossing changes, mild Copper wiring Attenuated, mild tortuousity   Periphery Attached    limited view due to poor dilation, attached, no heme, inferior paving stone degeneration / CR atrophy            IMAGING AND PROCEDURES  Imaging and Procedures for 05/07/2021  OCT, Retina - OU - Both Eyes       Right Eye Quality was good. Central Foveal Thickness: 362. Progression has been stable. Findings include no IRF, no SRF, abnormal foveal contour, epiretinal membrane (Mild ERM with blunting of foveal contour).   Left Eye Quality was borderline. Central Foveal Thickness: 377. Progression has been stable. Findings include abnormal foveal contour, epiretinal membrane, no SRF, no IRF, macular pucker (Mild ERM with blunted foveal profile and mild pucker; no IRF/CME).   Notes *Images captured and stored on drive  Diagnosis / Impression:  OD: Mild ERM with blunting of central foveal contour OS: Mild ERM with blunted foveal profile and mild pucker; no IRF/CME  Clinical management:  See below  Abbreviations: NFP - Normal foveal profile. CME - cystoid macular  edema. PED - pigment epithelial detachment. IRF - intraretinal fluid. SRF - subretinal fluid. EZ - ellipsoid zone. ERM - epiretinal membrane. ORA - outer retinal atrophy. ORT - outer retinal tubulation. SRHM - subretinal hyper-reflective material. IRHM - intraretinal hyper-reflective material            ASSESSMENT/PLAN:    ICD-10-CM   1. Cystoid macular edema of left eye  H35.352 OCT, Retina - OU - Both Eyes    2. History of retinal detachment  Z86.69     3. Epiretinal membrane (ERM) of both eyes   H35.373 OCT, Retina - OU - Both Eyes    4. Posterior vitreous detachment of right eye  H43.811     5. Essential hypertension  I10     6. Hypertensive retinopathy of both eyes  H35.033     7. Pseudophakia, both eyes  Z96.1     8. Primary open angle glaucoma of both eyes, unspecified glaucoma stage  H40.1130     9. Dry eyes  H04.123       1. CME OS  - pt with history of complicated CEIOL OS w/ RD in 2016  - history of CME since 2016 previously managed by Dr. Armanda Heritage   - was receiving intravitreal injections per pt report -- last visit w/ Dr. Posey Pronto in January 2021  - started on PF and Prolensa QID OS here on 09.02.21  - stopped latanoprost on 12.14.21  - BCVA OS improved to 20/40 from 20/60  - OCT shows stable improvement in CME  - history of glaucoma / steroid response and was previously on timolol qdaily OU, Alphagan P BID OU, and Latanoprost qhs OU  - pt reports no longer taking timolol or latanoprost per Dr. Rick Duff - latanoprost switched to Vyzulta, but now stopped due to adverse rxn - today, CME remains stably resolved, but AC with 1+ cell pigment - IOP elevated to 36 OS (MD applanation) -- ?steroid response but improved - cont PF QID OS - cont Alphagan P BID OU - will add dorzolamide BID OS  - pt scheduled to f/u with Dr. Rick Duff next week  - f/u 3-4 weeks, IOP check, DFE, OCT  2. History of RD OS  - 2016 after cataract surgery Manuella Ghazi)  - s/p PPV w/ Dr. Posey Pronto in 2016  - retina attached; appears stable  - monitor   3. Epiretinal membrane, both eyes  - mild ERM OU - BCVA OD: 20/20; OS: 20/40 - asymptomatic, no metamorphopsia - OCT shows mild progression of ERM OU and interval blunting of foveal contour - no indication for surgery at this time - monitor for now  4. PVD / vitreous syneresis OD+  - symptomatic floaters OD -- improved  - Discussed findings and prognosis  - No RT or RD on 360 peripheral exam  - Reviewed s/s of RT/RD  - Strict return  precautions for any such RT/RD signs/symptoms  - f/u in 6 wks -- DFE/OCT  5,6. Hypertensive retinopathy OU - discussed importance of tight BP control - monitor   7. Pseudophakia OU  - s/p CE/IOL (OD: Dr. Talbert Forest, OS: Dr. Ruthell Rummage, 2016)  - s/p YAG OS -- [place], May 2021  - OS with iridonesis and poor dilation - monitor   8. POAG OU  - IOP 17,36 today -- ?steroid response  - currently on Alphagan P BID OS, vyzulta BID OU -- taken off Vyzulta for possible allergic reaction  - history of timolol causing irritation  -  will start Dorzolamide BID OS only  9. Dry eyes OU - recommend artificial tears and lubricating ointment as needed  Ophthalmic Meds Ordered this visit:  Meds ordered this encounter  Medications   dorzolamide (TRUSOPT) 2 % ophthalmic solution    Sig: Place 1 drop into the left eye 2 (two) times daily.    Dispense:  10 mL    Refill:  1     Return for f/u 3-4 weeks, CME / IOP check OS, DFE, OCT.  There are no Patient Instructions on file for this visit.   This document serves as a record of services personally performed by Gardiner Sleeper, MD, PhD. It was created on their behalf by Leonie Douglas, an ophthalmic technician. The creation of this record is the provider's dictation and/or activities during the visit.    Electronically signed by: Leonie Douglas COA, 05/07/21  2:38 PM  This document serves as a record of services personally performed by Gardiner Sleeper, MD, PhD. It was created on their behalf by San Jetty. Owens Shark, OA an ophthalmic technician. The creation of this record is the provider's dictation and/or activities during the visit.    Electronically signed by: San Jetty. Owens Shark, [place] 01.27.2023 2:38 PM   Gardiner Sleeper, M.D., Ph.D. Diseases & Surgery of the Retina and [place] 05/07/2021  I have reviewed the above documentation for accuracy and completeness, and I agree with the above. Gardiner Sleeper, M.D., Ph.D. 05/07/21 2:38  PM   Abbreviations: M myopia (nearsighted); A astigmatism; H hyperopia (farsighted); P presbyopia; Mrx spectacle prescription;  CTL contact lenses; OD right eye; OS left eye; OU both eyes  XT exotropia; ET esotropia; PEK punctate epithelial keratitis; PEE punctate epithelial erosions; DES dry eye syndrome; MGD meibomian gland dysfunction; ATs artificial tears; PFAT's preservative free artificial tears; [place] nuclear sclerotic cataract; PSC posterior subcapsular cataract; ERM epi-retinal membrane; PVD posterior vitreous detachment; RD retinal detachment; DM diabetes mellitus; DR diabetic retinopathy; NPDR non-proliferative diabetic retinopathy; PDR proliferative diabetic retinopathy; CSME clinically significant macular edema; DME diabetic macular edema; dbh dot blot hemorrhages; CWS cotton wool spot; POAG primary open angle glaucoma; C/D cup-to-disc ratio; HVF humphrey visual field; GVF goldmann visual field; OCT optical coherence tomography; IOP intraocular pressure; BRVO Branch retinal vein occlusion; CRVO central retinal vein occlusion; CRAO central retinal artery occlusion; BRAO branch retinal artery occlusion; RT retinal tear; SB scleral buckle; PPV pars plana vitrectomy; VH Vitreous hemorrhage; PRP panretinal laser photocoagulation; IVK intravitreal kenalog; VMT vitreomacular traction; MH Macular hole;  NVD neovascularization of the disc; NVE neovascularization elsewhere; AREDS age related eye disease study; ARMD age related macular degeneration; POAG primary open angle glaucoma; EBMD epithelial/anterior basement membrane dystrophy; ACIOL anterior chamber intraocular lens; IOL intraocular lens; PCIOL posterior chamber intraocular lens; Phaco/IOL phacoemulsification with intraocular lens placement; [place] photorefractive keratectomy; LASIK laser assisted in situ keratomileusis; HTN hypertension; DM diabetes mellitus; COPD chronic obstructive pulmonary disease

## 2021-05-07 ENCOUNTER — Encounter (INDEPENDENT_AMBULATORY_CARE_PROVIDER_SITE_OTHER): Payer: Self-pay | Admitting: Ophthalmology

## 2021-05-07 ENCOUNTER — Other Ambulatory Visit: Payer: Self-pay

## 2021-05-07 ENCOUNTER — Ambulatory Visit (INDEPENDENT_AMBULATORY_CARE_PROVIDER_SITE_OTHER): Payer: Medicare Other | Admitting: Ophthalmology

## 2021-05-07 DIAGNOSIS — Z8669 Personal history of other diseases of the nervous system and sense organs: Secondary | ICD-10-CM | POA: Diagnosis not present

## 2021-05-07 DIAGNOSIS — Z961 Presence of intraocular lens: Secondary | ICD-10-CM

## 2021-05-07 DIAGNOSIS — H43811 Vitreous degeneration, right eye: Secondary | ICD-10-CM

## 2021-05-07 DIAGNOSIS — H35352 Cystoid macular degeneration, left eye: Secondary | ICD-10-CM

## 2021-05-07 DIAGNOSIS — H401130 Primary open-angle glaucoma, bilateral, stage unspecified: Secondary | ICD-10-CM

## 2021-05-07 DIAGNOSIS — H04123 Dry eye syndrome of bilateral lacrimal glands: Secondary | ICD-10-CM

## 2021-05-07 DIAGNOSIS — H35373 Puckering of macula, bilateral: Secondary | ICD-10-CM | POA: Diagnosis not present

## 2021-05-07 DIAGNOSIS — I10 Essential (primary) hypertension: Secondary | ICD-10-CM

## 2021-05-07 DIAGNOSIS — H35033 Hypertensive retinopathy, bilateral: Secondary | ICD-10-CM

## 2021-05-07 MED ORDER — DORZOLAMIDE HCL 2 % OP SOLN: 1.0000 [drp] | Freq: Two times a day (BID) | OPHTHALMIC | 1 refills | Status: DC

## 2021-05-20 NOTE — Progress Notes (Signed)
[facility] Note  05/28/2021     CHIEF COMPLAINT Patient presents for Retina Follow Up  HISTORY OF PRESENT ILLNESS: Garrett Green is a 76 y.o. male who presents to the clinic today for:   HPI     Retina Follow Up   Patient presents with  Other.  In left eye.  This started 3 weeks ago.  I, the attending physician,  performed the HPI with the patient and updated documentation appropriately.        Comments   Patient here for 3 weeks retina follow up for CME OS. Patient states vision getting better from where it was last time. Clearing up. Using drops.      Last edited by Bernarda Caffey, MD on 05/28/2021 12:20 PM.    Pt states he saw Dr. Rick Duff last week and his IOP was 17 in his left eye, using PF and Prolensa QD, Alphagan P and Cosopt BID OS   Referring physician: Thelma Comp, [place],  [place] [address]  HISTORICAL INFORMATION:   Selected notes from the MEDICAL RECORD NUMBER Referred by Dr. Rick Duff   CURRENT MEDICATIONS: Current Outpatient Medications (Ophthalmic Drugs)  Medication Sig   brimonidine (ALPHAGAN P) 0.1 % SOLN Place 1 drop into both eyes 2 (two) times daily.   Bromfenac Sodium (PROLENSA) 0.07 % SOLN Place 1 drop into the left eye daily.   dorzolamide (TRUSOPT) 2 % ophthalmic solution Place 1 drop into the left eye 2 (two) times daily.   prednisoLONE acetate (PRED FORTE) 1 % ophthalmic suspension Place 1 drop into the left eye daily. (Patient taking differently: Place 1 drop into the left eye in the morning, at noon, in the evening, and at bedtime.)   Latanoprostene Bunod (VYZULTA) 0.024 % SOLN Place 1 drop into both eyes at bedtime. (Patient not taking: Reported on 05/07/2021)   Loteprednol Etabonate (LOTEMAX SM) 0.38 % GEL Place 1 drop into the left eye daily. (Patient not taking: Reported on 05/07/2021)   No current facility-administered medications for this visit. (Ophthalmic Drugs)   Current  Outpatient Medications (Other)  Medication Sig   aspirin 325 MG tablet Take 325 mg by mouth daily.   atorvastatin (LIPITOR) 40 MG tablet Take 1 tablet (40 mg total) by mouth daily.   clopidogrel (PLAVIX) 75 MG tablet TAKE 1 TABLET BY MOUTH EVERY DAY   fluticasone (FLONASE) 50 MCG/ACT nasal spray Place 2 sprays into both nostrils daily as needed for allergies.   HYDROcodone-acetaminophen (NORCO/VICODIN) 5-325 MG tablet Take 1 tablet by mouth every 6 (six) hours as needed.   loratadine (CLARITIN) 10 MG tablet Take 10 mg by mouth daily as needed for allergies.   ramipril (ALTACE) 10 MG capsule TAKE 1 CAPSULE BY MOUTH  DAILY   zolpidem (AMBIEN) 10 MG tablet Take 0.5-1 tablets (5-10 mg total) by mouth at bedtime as needed for sleep.   No current facility-administered medications for this visit. (Other)   REVIEW OF SYSTEMS: ROS   Positive for: Gastrointestinal, Neurological, Musculoskeletal, Cardiovascular, Eyes Negative for: Constitutional, Skin, Genitourinary, HENT, Endocrine, Respiratory, Psychiatric, Allergic/Imm, Heme/Lymph Last edited by Theodore Demark, COA on 05/28/2021  8:47 AM.     ALLERGIES Allergies  Allergen Reactions   Sulfa Antibiotics Other (See Comments)    Lethargic and hypotension   PAST MEDICAL HISTORY Past Medical History:  Diagnosis Date   COVID-19 07/2019   History of blood transfusion 2014   "related to back OR"   History  of chicken pox    Hypertensive retinopathy    OU   Retinal detachment    OS   Stroke ([place])    TIA (transient ischemic attack) 10/17/2017; 10/19/2017   Past Surgical History:  Procedure Laterality Date   BACK SURGERY     CATARACT EXTRACTION Right 05/2019   Dr. Talbert Forest   CATARACT EXTRACTION Left 2017   CATARACT EXTRACTION W/ INTRAOCULAR LENS IMPLANT Left    COLONOSCOPY WITH PROPOFOL N/A 03/16/2016   Procedure: COLONOSCOPY WITH PROPOFOL;  Surgeon: Manya Silvas, MD;  Location: [facility]COPY;  Service: Endoscopy;  Laterality: N/A;    ESOPHAGOGASTRODUODENOSCOPY (EGD) WITH ESOPHAGEAL DILATION  1990s   EYE SURGERY Bilateral    Cat Sx   IR ANGIO INTRA EXTRACRAN SEL COM CAROTID INNOMINATE BILAT MOD SED  10/31/2017   IR ANGIO INTRA EXTRACRAN SEL COM CAROTID INNOMINATE BILAT MOD SED  01/15/2019   IR ANGIO INTRA EXTRACRAN SEL COM CAROTID INNOMINATE BILAT MOD SED  09/27/2019   IR ANGIO VERTEBRAL SEL VERTEBRAL BILAT MOD SED  10/31/2017   IR ANGIO VERTEBRAL SEL VERTEBRAL BILAT MOD SED  01/15/2019   IR ANGIO VERTEBRAL SEL VERTEBRAL BILAT MOD SED  09/27/2019   IR US GUIDE VASC ACCESS RIGHT  01/15/2019   IR US GUIDE VASC ACCESS RIGHT  09/27/2019   LUMBAR LAMINECTOMY/DECOMPRESSION MICRODISCECTOMY Left 07/26/2012   Procedure: LUMBAR LAMINECTOMY/DECOMPRESSION MICRODISCECTOMY 1 LEVEL;  Surgeon: Eustace Moore, MD;  Location: [facility];  Service: Neurosurgery;  Laterality: Left;  lumbar five sacral one   RETINAL DETACHMENT SURGERY Left 2016   Dr. Posey Pronto   SHOULDER ARTHROSCOPY WITH OPEN ROTATOR CUFF REPAIR Left 1990s   TONSILLECTOMY     YAG LASER APPLICATION Left [id number]   Dr. Talbert Forest   FAMILY HISTORY Family History  Problem Relation Age of Onset   Heart disease Mother    Cancer Sister    Dementia Brother    Dementia Sister    Diabetes Brother    Diabetes Sister    Colon cancer Neg Hx    Prostate cancer Neg Hx    SOCIAL HISTORY Social History   Tobacco Use   Smoking status: Former    Packs/day: 1.00    Years: 25.00    Pack years: 25.00    Types: Cigarettes    Quit date: 03/03/1988    Years since quitting: 33.2   Smokeless tobacco: Never  Vaping Use   Vaping Use: Never used  Substance Use Topics   Alcohol use: Yes    Alcohol/week: 1.0 standard drink    Types: 1 Standard drinks or equivalent per week   Drug use: Never       OPHTHALMIC EXAM:  Base Eye Exam     Visual Acuity (Snellen - Linear)       Right Left   Dist [place] 20/20 20/80   Dist ph [place]  20/70 -1         Tonometry (Tonopen, 8:43 AM)       Right Left    Pressure 11 05         Tonometry #2 (Tonopen, 9:23 AM)       Right Left   Pressure 17 13         Pupils       Dark Light Shape React APD   Right 2 1 Round Minimal None   Left 2 1 Round Minimal None         Visual Fields (Counting fingers)  Left Right    Full Full         Extraocular Movement       Right Left    Full, Ortho Full, Ortho         Neuro/Psych     Oriented x3: Yes   Mood/Affect: Normal         Dilation     Both eyes: 1.0% Mydriacyl, 2.5% Phenylephrine @ 8:43 AM           Slit Lamp and Fundus Exam     Slit Lamp Exam       Right Left   Lids/Lashes Dermatochalasis - upper lid Dermatochalasis - upper lid, no edema   Conjunctiva/Sclera Mild nasal Pinguecula White and quiet   Cornea Mild arcus, well healed temporal cataract wounds, 1-2+ inferior Punctate epithelial erosions Mild arcus, well healed temporal cataract wounds, 2+ Punctate epithelial erosions, irregular epi   Anterior Chamber Deep and quiet Deep, 1+cell/pigment   Iris Round and dilated mild iridodonesis, Round and poorly dilated to 3.5   Lens PC IOL in good position PC IOL with fine pigment depositon, open PC   Anterior Vitreous Vitreous syneresis, Posterior vitreous detachment, vitreous condensations Vitreous syneresis         Fundus Exam       Right Left   Disc Pink and Sharp Pink and Sharp, +PPA, +cupping   C/D Ratio 0.4 0.7   Macula Flat, Blunted foveal reflex, ERM, mild RPE mottling and clumping, No heme or edema Blunted foveal reflex, central CME - stably improved, +ERM, RPE mottling and clumping, no heme   Vessels Attenuated, tortuous, mild AV crossing changes, mild Copper wiring Attenuated, mild tortuousity   Periphery Attached    limited view due to poor dilation, attached, no heme, inferior paving stone degeneration / CR atrophy            IMAGING AND PROCEDURES  Imaging and Procedures for 05/28/2021  OCT, Retina - OU - Both Eyes       Right  Eye Quality was good. Central Foveal Thickness: 362. Progression has been stable. Findings include no IRF, no SRF, abnormal foveal contour, epiretinal membrane (Mild ERM with blunting of foveal contour -- stable).   Left Eye Quality was borderline. Central Foveal Thickness: 377. Progression has been stable. Findings include abnormal foveal contour, epiretinal membrane, no SRF, no IRF, macular pucker (Mild ERM with blunted foveal profile and mild pucker; no IRF/CME).   Notes *Images captured and stored on drive  Diagnosis / Impression:  OD: Mild ERM with blunting of central foveal contour OS: Mild ERM with blunted foveal profile and mild pucker; no IRF/CME  Clinical management:  See below  Abbreviations: NFP - Normal foveal profile. CME - cystoid macular edema. PED - pigment epithelial detachment. IRF - intraretinal fluid. SRF - subretinal fluid. EZ - ellipsoid zone. ERM - epiretinal membrane. ORA - outer retinal atrophy. ORT - outer retinal tubulation. SRHM - subretinal hyper-reflective material. IRHM - intraretinal hyper-reflective material             ASSESSMENT/PLAN:    ICD-10-CM   1. Cystoid macular edema of left eye  H35.352 OCT, Retina - OU - Both Eyes    2. History of retinal detachment  Z86.69     3. Epiretinal membrane (ERM) of both eyes  H35.373     4. Posterior vitreous detachment of right eye  H43.811     5. Essential hypertension  I10     6. Hypertensive retinopathy of  both eyes  H35.033     7. Pseudophakia, both eyes  Z96.1     8. Primary open angle glaucoma of both eyes, unspecified glaucoma stage  H40.1130     9. Dry eyes  H04.123      1. CME OS  - pt with history of complicated CEIOL OS w/ RD in 2016  - history of CME since 2016 previously managed by Dr. Armanda Heritage   - was receiving intravitreal injections per pt report -- last visit w/ Dr. Posey Pronto in January 2021  - started on PF and Prolensa QID OS here on 09.02.21  - stopped latanoprost on  12.14.21  - BCVA OS improved to 20/70 from 20/200  - OCT shows stable improvement in CME  - history of glaucoma / steroid response and was previously on timolol qdaily OU, Alphagan P BID OU, and Latanoprost qhs OU - pt reports no longer taking timolol or latanoprost per Dr. Rick Duff - latanoprost switched to Vyzulta, but now stopped due to adverse rxn - today, CME remains stably resolved, but AC with 1+ cell pigment - IOP okay at 17 OS (MD tonopen)  - cont PF and Prolensa qdaily OS - cont Alphagan P BID OU - cont dorzolamide BID OS  - f/u 2-3 months, IOP check, DFE, OCT  2. History of RD OS  - 2016 after cataract surgery Manuella Ghazi)  - s/p PPV w/ Dr. Posey Pronto in 2016  - retina attached; appears stable  - monitor   3. Epiretinal membrane, both eyes  - mild ERM OU - BCVA OD: 20/20; OS: 20/70 - asymptomatic, no metamorphopsia - OCT shows mild ERM OU with interval blunting of foveal contour - no indication for surgery at this time - monitor for now  4. PVD / vitreous syneresis OD+  - symptomatic floaters OD -- improved  - Discussed findings and prognosis  - No RT or RD on 360 peripheral exam  - Reviewed s/s of RT/RD  - Strict return precautions for any such RT/RD signs/symptoms   - monitor  5,6. Hypertensive retinopathy OU - discussed importance of tight BP control - monitor   7. Pseudophakia OU  - s/p CE/IOL (OD: Dr. Talbert Forest, OS: Dr. Ruthell Rummage, 2016)  - s/p YAG OS -- [place], May 2021  - OS with iridonesis and poor dilation - monitor   8. POAG OU  - IOP 17,13 today  - currently on Alphagan P BID OS, Dorzolamide BID OS -- cont  - history of timolol and vyzulta causing irritation / adverse rxns  9. Dry eyes OU - recommend artificial tears and lubricating ointment as needed   Ophthalmic Meds Ordered this visit:  No orders of the defined types were placed in this encounter.    Return for f/u 2-3 months, CME OS, DFE, OCT.  There are no Patient Instructions on file for this  visit.   This document serves as a record of services personally performed by Gardiner Sleeper, MD, PhD. It was created on their behalf by Leonie Douglas, an ophthalmic technician. The creation of this record is the provider's dictation and/or activities during the visit.    Electronically signed by: Leonie Douglas COA, 05/28/21  12:25 PM  This document serves as a record of services personally performed by Gardiner Sleeper, MD, PhD. It was created on their behalf by San Jetty. Owens Shark, OA an ophthalmic technician. The creation of this record is the provider's dictation and/or activities during the visit.    Electronically signed  by: San Jetty Marguerita Merles 02.17.2023 12:25 PM  Gardiner Sleeper, M.D., Ph.D. Diseases & Surgery of the Retina and Vitreous [facility]  I have reviewed the above documentation for accuracy and completeness, and I agree with the above. Gardiner Sleeper, M.D., Ph.D. 05/28/21 12:25 PM   Abbreviations: M myopia (nearsighted); A astigmatism; H hyperopia (farsighted); P presbyopia; Mrx spectacle prescription;  CTL contact lenses; OD right eye; OS left eye; OU both eyes  XT exotropia; ET esotropia; PEK punctate epithelial keratitis; PEE punctate epithelial erosions; DES dry eye syndrome; MGD meibomian gland dysfunction; ATs artificial tears; PFAT's preservative free artificial tears; [place] nuclear sclerotic cataract; PSC posterior subcapsular cataract; ERM epi-retinal membrane; PVD posterior vitreous detachment; RD retinal detachment; DM diabetes mellitus; DR diabetic retinopathy; NPDR non-proliferative diabetic retinopathy; PDR proliferative diabetic retinopathy; CSME clinically significant macular edema; DME diabetic macular edema; dbh dot blot hemorrhages; CWS cotton wool spot; POAG primary open angle glaucoma; C/D cup-to-disc ratio; HVF humphrey visual field; GVF goldmann visual field; OCT optical coherence tomography; IOP intraocular pressure; BRVO Branch retinal  vein occlusion; CRVO central retinal vein occlusion; CRAO central retinal artery occlusion; BRAO branch retinal artery occlusion; RT retinal tear; SB scleral buckle; PPV pars plana vitrectomy; VH Vitreous hemorrhage; PRP panretinal laser photocoagulation; IVK intravitreal kenalog; VMT vitreomacular traction; MH Macular hole;  NVD neovascularization of the disc; NVE neovascularization elsewhere; AREDS age related eye disease study; ARMD age related macular degeneration; POAG primary open angle glaucoma; EBMD epithelial/anterior basement membrane dystrophy; ACIOL anterior chamber intraocular lens; IOL intraocular lens; PCIOL posterior chamber intraocular lens; Phaco/IOL phacoemulsification with intraocular lens placement; [place] photorefractive keratectomy; LASIK laser assisted in situ keratomileusis; HTN hypertension; DM diabetes mellitus; COPD chronic obstructive pulmonary disease

## 2021-05-28 ENCOUNTER — Other Ambulatory Visit: Payer: Self-pay

## 2021-05-28 ENCOUNTER — Ambulatory Visit (INDEPENDENT_AMBULATORY_CARE_PROVIDER_SITE_OTHER): Payer: Medicare Other | Admitting: Ophthalmology

## 2021-05-28 ENCOUNTER — Encounter (INDEPENDENT_AMBULATORY_CARE_PROVIDER_SITE_OTHER): Payer: Self-pay | Admitting: Ophthalmology

## 2021-05-28 DIAGNOSIS — H35352 Cystoid macular degeneration, left eye: Secondary | ICD-10-CM

## 2021-05-28 DIAGNOSIS — I10 Essential (primary) hypertension: Secondary | ICD-10-CM

## 2021-05-28 DIAGNOSIS — H43811 Vitreous degeneration, right eye: Secondary | ICD-10-CM | POA: Diagnosis not present

## 2021-05-28 DIAGNOSIS — H35033 Hypertensive retinopathy, bilateral: Secondary | ICD-10-CM

## 2021-05-28 DIAGNOSIS — Z8669 Personal history of other diseases of the nervous system and sense organs: Secondary | ICD-10-CM

## 2021-05-28 DIAGNOSIS — H401130 Primary open-angle glaucoma, bilateral, stage unspecified: Secondary | ICD-10-CM

## 2021-05-28 DIAGNOSIS — H35373 Puckering of macula, bilateral: Secondary | ICD-10-CM | POA: Diagnosis not present

## 2021-05-28 DIAGNOSIS — H04123 Dry eye syndrome of bilateral lacrimal glands: Secondary | ICD-10-CM

## 2021-05-28 DIAGNOSIS — Z961 Presence of intraocular lens: Secondary | ICD-10-CM

## 2021-06-04 ENCOUNTER — Other Ambulatory Visit: Payer: Self-pay | Admitting: Family Medicine

## 2021-06-04 DIAGNOSIS — M47816 Spondylosis without myelopathy or radiculopathy, lumbar region: Secondary | ICD-10-CM

## 2021-06-06 MED ORDER — HYDROCODONE-ACETAMINOPHEN 5-325 MG PO TABS: 1.0000 | ORAL_TABLET | Freq: Four times a day (QID) | ORAL | 0 refills | Status: DC | PRN

## 2021-06-22 NOTE — Progress Notes (Signed)
?  ? ? ?I,Roshena L Chambers,acting as a scribe for Lelon Huh, MD.,have documented all relevant documentation on the behalf of Lelon Huh, MD,as directed by  Lelon Huh, MD while in the presence of Lelon Huh, MD. ? ? ? ?Established patient visit ? ? ?Patient: Garrett Green   DOB: Sep 24, 1945   76 y.o. Male  MRN: [id number] ?Visit Date: 06/23/2021 ? ?Today's healthcare provider: Lelon Huh, MD  ? ?Chief Complaint  ?Patient presents with  ? Hypertension  ? Prediabetes  ? ?Subjective  ?  ?HPI  ?Prediabetes, Follow-up ? ?Lab Results  ?Component Value Date  ? HGBA1C 5.8 (A) 06/23/2021  ? HGBA1C 6.0 (H) 12/23/2020  ? HGBA1C 6.5 (H) 12/13/2019  ? GLUCOSE 128 (H) 12/23/2020  ? GLUCOSE 128 (H) 09/27/2019  ? GLUCOSE 105 (H) 07/09/2019  ? ? ?Last seen for for this 6 months ago.  ?Management since that visit includes continuing lifestyle modifications. ?Current symptoms include none and have been stable. ? ?Prior visit with dietician: no ?Current diet: in general, an "unhealthy" diet ?Current exercise: yard work ? ?Pertinent Labs: ?   ?Component Value Date/Time  ? CHOL 123 12/23/2020 0946  ? TRIG 87 12/23/2020 0946  ? CHOLHDL 2.4 12/23/2020 0946  ? CHOLHDL 4.2 10/18/2017 0555  ? CREATININE 1.11 12/23/2020 0946  ? ? ?Wt Readings from Last 3 Encounters:  ?06/23/21 179 lb (81.2 kg)  ?03/26/21 174 lb (78.9 kg)  ?12/23/20 184 lb (83.5 kg)  ? ? ?-----------------------------------------------------------------------------------------  ? ?Hypertension, follow-up ? ?BP Readings from Last 3 Encounters:  ?06/23/21 (!) 181/68  ?03/26/21 (!) 175/74  ?12/23/20 138/72  ? Wt Readings from Last 3 Encounters:  ?06/23/21 179 lb (81.2 kg)  ?03/26/21 174 lb (78.9 kg)  ?12/23/20 184 lb (83.5 kg)  ?  ? ?He was last seen for hypertension 6 months ago.  ?BP at that visit was 138/72. Management since that visit includes continue same medication. ? ?He reports good compliance with treatment. ?He is not having side effects.  ?He is  following a Regular diet. ?He is exercising. ?He does not smoke. ? ?Use of agents associated with hypertension: NSAIDS.  ? ?Outside blood pressures are 130/80. ?Symptoms: ?No chest pain No chest pressure  ?No palpitations No syncope  ?No dyspnea No orthopnea  ?No paroxysmal nocturnal dyspnea No lower extremity edema  ?  ? ?---------------------------------------------------------------------------------------------------  ? ?Medications: ?Outpatient Medications Prior to Visit  ?Medication Sig  ? aspirin 325 MG tablet Take 325 mg by mouth daily.  ? atorvastatin (LIPITOR) 40 MG tablet Take 1 tablet (40 mg total) by mouth daily.  ? brimonidine (ALPHAGAN P) 0.1 % SOLN Place 1 drop into both eyes 2 (two) times daily.  ? Bromfenac Sodium (PROLENSA) 0.07 % SOLN Place 1 drop into the left eye daily.  ? clopidogrel (PLAVIX) 75 MG tablet TAKE 1 TABLET BY MOUTH EVERY DAY  ? dorzolamide (TRUSOPT) 2 % ophthalmic solution Place 1 drop into the left eye 2 (two) times daily.  ? fluticasone (FLONASE) 50 MCG/ACT nasal spray Place 2 sprays into both nostrils daily as needed for allergies.  ? HYDROcodone-acetaminophen (NORCO/VICODIN) 5-325 MG tablet Take 1 tablet by mouth every 6 (six) hours as needed.  ? loratadine (CLARITIN) 10 MG tablet Take 10 mg by mouth daily as needed for allergies.  ? prednisoLONE acetate (PRED FORTE) 1 % ophthalmic suspension Place 1 drop into the left eye daily. (Patient taking differently: Place 1 drop into the left eye in the morning, at noon, in the  evening, and at bedtime.)  ? ramipril (ALTACE) 10 MG capsule TAKE 1 CAPSULE BY MOUTH  DAILY  ? zolpidem (AMBIEN) 10 MG tablet Take 0.5-1 tablets (5-10 mg total) by mouth at bedtime as needed for sleep.  ? [DISCONTINUED] Latanoprostene Bunod (VYZULTA) 0.024 % SOLN Place 1 drop into both eyes at bedtime. (Patient not taking: Reported on 05/07/2021)  ? [DISCONTINUED] Loteprednol Etabonate (LOTEMAX SM) 0.38 % GEL Place 1 drop into the left eye daily. (Patient not  taking: Reported on 05/07/2021)  ? ?No facility-administered medications prior to visit.  ? ? ?Review of Systems  ?Constitutional:  Negative for appetite change, chills and fever.  ?Respiratory:  Negative for chest tightness, shortness of breath and wheezing.   ?Cardiovascular:  Negative for chest pain and palpitations.  ?Gastrointestinal:  Negative for abdominal pain, nausea and vomiting.  ? ? ?  Objective  ?  ?BP (!) 181/68 (BP Location: Right Arm, Patient Position: Sitting, Cuff Size: Large)   Pulse (!) 59   Temp 97.6 ?F (36.4 ?C) (Oral)   Resp 12   Wt 179 lb (81.2 kg)   SpO2 100% Comment: room air  BMI 27.22 kg/m?  ? ?Today's Vitals  ? 06/23/21 0813 06/23/21 0817  ?BP: (!) 175/74 (!) 181/68  ?Pulse: (!) 59   ?Resp: 12   ?Temp: 97.6 ?F (36.4 ?C)   ?TempSrc: Oral   ?SpO2: 100%   ?Weight: 179 lb (81.2 kg)   ? ?Body mass index is 27.22 kg/m?.  ? ? ?Physical Exam  ? ?General appearance:  ?Well developed, well nourished male, cooperative and in no acute distress ?Head: Normocephalic, without obvious abnormality, atraumatic ?Respiratory: Respirations even and unlabored, normal respiratory rate ?Extremities: All extremities are intact.  ?Skin: Skin color, texture, turgor normal. No rashes seen  ?Psych: Appropriate mood and affect. ?Neurologic: Mental status: Alert, oriented to person, place, and time, thought content appropriate.  ? ?Results for orders placed or performed in visit on 06/23/21  ?POCT HgB A1C  ?Result Value Ref Range  ? Hemoglobin A1C 5.8 (A) 4.0 - 5.6 %  ? Est. average glucose Bld gHb Est-mCnc 120   ? ? Assessment & Plan  ?  ? ?1. Prediabetes ?Doing well with diet, a1c improving.  ? ?2. Mild concentric left ventricular hypertrophy (LVH) ? ? ?3. Essential (primary) hypertension ?Uncontrolled, he reports good compliance with ramipril. Will add amLODipine (NORVASC) 2.5 MG tablet; Take 1 tablet (2.5 mg total) by mouth daily.  Dispense: 90 tablet; Refill: 1  ?Encourage healthy low sodium diet.  ? ?Future  Appointments  ?Date Time Provider [place]  ?07/30/2021  8:30 AM Bernarda Caffey, MD [facility] None  ?09/29/2021  8:40 AM Birdie Sons, MD [facility]  ?09/29/2021  1:15 PM Bernardo Heater, Ronda Fairly, MD [facility] None  ?03/31/2022 11:40 AM Alfonso Patten, MD [facility] None  ?  ?   ? ?The entirety of the information documented in the History of Present Illness, Review of Systems and Physical Exam were personally obtained by me. Portions of this information were initially documented by the CMA and reviewed by me for thoroughness and accuracy.   ? ? ?Lelon Huh, MD  ?[facility] ?[phone] (phone) ?[phone] (fax) ? ?[facility]  ?

## 2021-06-23 ENCOUNTER — Other Ambulatory Visit: Payer: Self-pay

## 2021-06-23 ENCOUNTER — Ambulatory Visit (INDEPENDENT_AMBULATORY_CARE_PROVIDER_SITE_OTHER): Payer: Medicare Other | Admitting: Family Medicine

## 2021-06-23 ENCOUNTER — Encounter: Payer: Self-pay | Admitting: Family Medicine

## 2021-06-23 VITALS — BP 154/80 | HR 59 | Temp 97.6°F | Resp 12 | Wt 179.0 lb

## 2021-06-23 DIAGNOSIS — R7303 Prediabetes: Secondary | ICD-10-CM | POA: Diagnosis not present

## 2021-06-23 DIAGNOSIS — I10 Essential (primary) hypertension: Secondary | ICD-10-CM

## 2021-06-23 DIAGNOSIS — I517 Cardiomegaly: Secondary | ICD-10-CM

## 2021-06-23 LAB — POCT GLYCOSYLATED HEMOGLOBIN (HGB A1C)
Est. average glucose Bld gHb Est-mCnc: 120
Hemoglobin A1C: 5.8 % — AB (ref 4.0–5.6)

## 2021-06-23 MED ORDER — AMLODIPINE BESYLATE 2.5 MG PO TABS: 2.5000 mg | ORAL_TABLET | Freq: Every day | ORAL | 1 refills | Status: DC

## 2021-06-23 NOTE — Patient Instructions (Signed)
.   Please review the attached list of medications and notify my office if there are any errors.   . Please bring all of your medications to every appointment so we can make sure that our medication list is the same as yours.   

## 2021-07-01 ENCOUNTER — Other Ambulatory Visit (INDEPENDENT_AMBULATORY_CARE_PROVIDER_SITE_OTHER): Payer: Self-pay | Admitting: Ophthalmology

## 2021-07-30 ENCOUNTER — Encounter (INDEPENDENT_AMBULATORY_CARE_PROVIDER_SITE_OTHER): Payer: Medicare Other | Admitting: Ophthalmology

## 2021-08-04 NOTE — Progress Notes (Signed)
?[facility] Note ? ?08/17/2021 ? ?  ? ?CHIEF COMPLAINT ?Patient presents for Retina Follow Up ? ?HISTORY OF PRESENT ILLNESS: ?Garrett Green is a 76 y.o. male who presents to the clinic today for:  ? ?HPI   ? ? Retina Follow Up   ?Patient presents with  Other.  In left eye.  This started 3 months ago.  I, the attending physician,  performed the HPI with the patient and updated documentation appropriately. ? ?  ?  ? ? Comments   ?Patient here for 3 months retina follow up for CME OS. Patient states vision no better. About the same as last visit. Has more stuff floating around in eye. Sometimes has eye pain OD.  ? ?  ?  ?Last edited by Bernarda Caffey, MD on 08/17/2021  9:52 AM.  ?  ?Pt is using PF and Prolensa OS once a day, Alphagan P BID OU, dorzolamide BID OS, pt feels like vision is a little blurry ? ? ?Referring physician: ?Birdie Sons, MD ?[address] ?[address] ?[place],  [place] [address] ? ?HISTORICAL INFORMATION:  ? ?Selected notes from the [facility] ?Referred by Dr. Rick Duff  ? ?CURRENT MEDICATIONS: ?Current Outpatient Medications (Ophthalmic Drugs)  ?Medication Sig  ? brimonidine (ALPHAGAN P) 0.1 % SOLN Place 1 drop into both eyes 2 (two) times daily.  ? Bromfenac Sodium (PROLENSA) 0.07 % SOLN Place 1 drop into the left eye daily.  ? dorzolamide (TRUSOPT) 2 % ophthalmic solution INSTILL 1 DROP INTO LEFT EYE TWICE A DAY  ? prednisoLONE acetate (PRED FORTE) 1 % ophthalmic suspension Place 1 drop into the left eye daily. (Patient taking differently: Place 1 drop into the left eye in the morning, at noon, in the evening, and at bedtime.)  ? ?No current facility-administered medications for this visit. (Ophthalmic Drugs)  ? ?Current Outpatient Medications (Other)  ?Medication Sig  ? amLODipine (NORVASC) 2.5 MG tablet Take 1 tablet (2.5 mg total) by mouth daily.  ? aspirin 325 MG tablet Take 325 mg by mouth daily.  ? atorvastatin (LIPITOR) 40 MG tablet Take 1 tablet  (40 mg total) by mouth daily.  ? clopidogrel (PLAVIX) 75 MG tablet TAKE 1 TABLET BY MOUTH EVERY DAY  ? fluticasone (FLONASE) 50 MCG/ACT nasal spray Place 2 sprays into both nostrils daily as needed for allergies.  ? HYDROcodone-acetaminophen (NORCO/VICODIN) 5-325 MG tablet Take 1 tablet by mouth every 6 (six) hours as needed.  ? loratadine (CLARITIN) 10 MG tablet Take 10 mg by mouth daily as needed for allergies.  ? ramipril (ALTACE) 10 MG capsule TAKE 1 CAPSULE BY MOUTH  DAILY  ? zolpidem (AMBIEN) 10 MG tablet Take 0.5-1 tablets (5-10 mg total) by mouth at bedtime as needed for sleep.  ? ?No current facility-administered medications for this visit. (Other)  ? ?REVIEW OF SYSTEMS: ?ROS   ?Positive for: Gastrointestinal, Neurological, Musculoskeletal, Cardiovascular, Eyes ?Negative for: Constitutional, Skin, Genitourinary, HENT, Endocrine, Respiratory, Psychiatric, Allergic/Imm, Heme/Lymph ?Last edited by Theodore Demark, COA on 08/17/2021  8:46 AM.  ?  ? ?ALLERGIES ?Allergies  ?Allergen Reactions  ? Sulfa Antibiotics Other (See Comments)  ?  Lethargic and hypotension  ? ?PAST MEDICAL HISTORY ?Past Medical History:  ?Diagnosis Date  ? COVID-19 07/2019  ? History of blood transfusion 2014  ? "related to back OR"  ? History of chicken pox   ? Hypertensive retinopathy   ? OU  ? Retinal detachment   ? OS  ?  Stroke [facility])   ? TIA (transient ischemic attack) 10/17/2017; 10/19/2017  ? ?Past Surgical History:  ?Procedure Laterality Date  ? BACK SURGERY    ? CATARACT EXTRACTION Right 05/2019  ? Dr. Talbert Forest  ? CATARACT EXTRACTION Left 2017  ? CATARACT EXTRACTION W/ INTRAOCULAR LENS IMPLANT Left   ? COLONOSCOPY WITH PROPOFOL N/A 03/16/2016  ? Procedure: COLONOSCOPY WITH PROPOFOL;  Surgeon: Manya Silvas, MD;  Location: [facility] ENDOSCOPY;  Service: Endoscopy;  Laterality: N/A;  ? ESOPHAGOGASTRODUODENOSCOPY (EGD) WITH ESOPHAGEAL DILATION  1990s  ? EYE SURGERY Bilateral   ? Cat Sx  ? IR ANGIO INTRA EXTRACRAN SEL COM CAROTID INNOMINATE  BILAT MOD SED  10/31/2017  ? IR ANGIO INTRA EXTRACRAN SEL COM CAROTID INNOMINATE BILAT MOD SED  01/15/2019  ? IR ANGIO INTRA EXTRACRAN SEL COM CAROTID INNOMINATE BILAT MOD SED  09/27/2019  ? IR ANGIO VERTEBRAL SEL VERTEBRAL BILAT MOD SED  10/31/2017  ? IR ANGIO VERTEBRAL SEL VERTEBRAL BILAT MOD SED  01/15/2019  ? IR ANGIO VERTEBRAL SEL VERTEBRAL BILAT MOD SED  09/27/2019  ? IR US GUIDE VASC ACCESS RIGHT  01/15/2019  ? IR US GUIDE VASC ACCESS RIGHT  09/27/2019  ? LUMBAR LAMINECTOMY/DECOMPRESSION MICRODISCECTOMY Left 07/26/2012  ? Procedure: LUMBAR LAMINECTOMY/DECOMPRESSION MICRODISCECTOMY 1 LEVEL;  Surgeon: Eustace Moore, MD;  Location: [facility];  Service: Neurosurgery;  Laterality: Left;  lumbar five sacral one  ? RETINAL DETACHMENT SURGERY Left 2016  ? Dr. Posey Pronto  ? SHOULDER ARTHROSCOPY WITH OPEN ROTATOR CUFF REPAIR Left 1990s  ? TONSILLECTOMY    ? YAG LASER APPLICATION Left 11/1446  ? Dr. Talbert Forest  ? ?FAMILY HISTORY ?Family History  ?Problem Relation Age of Onset  ? Heart disease Mother   ? Cancer Sister   ? Dementia Brother   ? Dementia Sister   ? Diabetes Brother   ? Diabetes Sister   ? Colon cancer Neg Hx   ? Prostate cancer Neg Hx   ? ?SOCIAL HISTORY ?Social History  ? ?Tobacco Use  ? Smoking status: Former  ?  Packs/day: 1.00  ?  Years: 25.00  ?  Pack years: 25.00  ?  Types: Cigarettes  ?  Quit date: 03/03/1988  ?  Years since quitting: 33.4  ? Smokeless tobacco: Never  ?Vaping Use  ? Vaping Use: Never used  ?Substance Use Topics  ? Alcohol use: Yes  ?  Alcohol/week: 1.0 standard drink  ?  Types: 1 Standard drinks or equivalent per week  ? Drug use: Never  ?  ? ?  ?OPHTHALMIC EXAM: ? ?Base Eye Exam   ? ? Visual Acuity (Snellen - Linear)   ? ?   Right Left  ? Dist [employer] 20/20 -1 20/50  ? Dist ph [place]  NI  ? ?  ?  ? ? Tonometry (Tonopen, 8:42 AM)   ? ?   Right Left  ? Pressure 18 17  ? ?  ?  ? ? Pupils   ? ?   Dark Light Shape React APD  ? Right 2 1 Round Brisk None  ? Left 2 1 Round Brisk None  ? ?  ?  ? ? Visual Fields  (Counting fingers)   ? ?   Left Right  ?  Full Full  ? ?  ?  ? ? Extraocular Movement   ? ?   Right Left  ?  Full, Ortho Full, Ortho  ? ?  ?  ? ? Neuro/Psych   ? ? Oriented x3: Yes  ?  Mood/Affect: Normal  ? ?  ?  ? ? Dilation   ? ? Both eyes: 1.0% Mydriacyl, 2.5% Phenylephrine @ 8:42 AM  ? ?  ?  ? ?  ? ?Slit Lamp and Fundus Exam   ? ? Slit Lamp Exam   ? ?   Right Left  ? Lids/Lashes Dermatochalasis - upper lid Dermatochalasis - upper lid, Meibomian gland dysfunction  ? Conjunctiva/Sclera Mild nasal Pinguecula, 1+ Injection White and quiet  ? Cornea Mild arcus, well healed temporal cataract wounds, 1-2+ inferior Punctate epithelial erosions, mild tear film debris Mild arcus, well healed temporal cataract wounds, 2-3+ Punctate epithelial erosions, irregular epi, tear film debris  ? Anterior Chamber Deep and quiet Deep, 1+cell/pigment  ? Iris Round and dilated mild iridodonesis, Round and poorly dilated to 3.0  ? Lens PC IOL in good position PC IOL with fine pigment depositon, open PC  ? Anterior Vitreous Vitreous syneresis, Posterior vitreous detachment, vitreous condensations, Weiss ring Vitreous syneresis  ? ?  ?  ? ? Fundus Exam   ? ?   Right Left  ? Disc Pink and Sharp mild pallor, sharp rim, +PPA, +cupping  ? C/D Ratio 0.5 0.7  ? Macula Flat, Blunted foveal reflex, ERM, mild RPE mottling and clumping, No heme or edema Blunted foveal reflex, central CME - stably improved, +ERM, RPE mottling and clumping, no heme  ? Vessels mild attenuation, mild tortuosity Attenuated, mild tortuosity  ? Periphery Attached, No heme limited view due to poor dilation, attached, no heme, inferior paving stone degeneration / CR atrophy  ? ?  ?  ? ?  ? ? ?IMAGING AND PROCEDURES  ?Imaging and Procedures for 08/17/2021 ? ?OCT, Retina - OU - Both Eyes   ? ?   ?Right Eye ?Quality was good. Central Foveal Thickness: 370. Progression has been stable. Findings include no IRF, no SRF, abnormal foveal contour, epiretinal membrane (?mild thickening  of ERM with blunting of foveal contour ).  ? ?Left Eye ?Quality was borderline. Central Foveal Thickness: 334. Progression has been stable. Findings include abnormal foveal contour, epiretinal membrane, no

## 2021-08-17 ENCOUNTER — Ambulatory Visit (INDEPENDENT_AMBULATORY_CARE_PROVIDER_SITE_OTHER): Payer: Medicare Other | Admitting: Ophthalmology

## 2021-08-17 ENCOUNTER — Encounter (INDEPENDENT_AMBULATORY_CARE_PROVIDER_SITE_OTHER): Payer: Self-pay | Admitting: Ophthalmology

## 2021-08-17 DIAGNOSIS — H35373 Puckering of macula, bilateral: Secondary | ICD-10-CM

## 2021-08-17 DIAGNOSIS — H401130 Primary open-angle glaucoma, bilateral, stage unspecified: Secondary | ICD-10-CM

## 2021-08-17 DIAGNOSIS — H43811 Vitreous degeneration, right eye: Secondary | ICD-10-CM

## 2021-08-17 DIAGNOSIS — I10 Essential (primary) hypertension: Secondary | ICD-10-CM | POA: Diagnosis not present

## 2021-08-17 DIAGNOSIS — Z8669 Personal history of other diseases of the nervous system and sense organs: Secondary | ICD-10-CM | POA: Diagnosis not present

## 2021-08-17 DIAGNOSIS — Z961 Presence of intraocular lens: Secondary | ICD-10-CM

## 2021-08-17 DIAGNOSIS — H35352 Cystoid macular degeneration, left eye: Secondary | ICD-10-CM | POA: Diagnosis not present

## 2021-08-17 DIAGNOSIS — H04123 Dry eye syndrome of bilateral lacrimal glands: Secondary | ICD-10-CM

## 2021-08-17 DIAGNOSIS — H35033 Hypertensive retinopathy, bilateral: Secondary | ICD-10-CM

## 2021-09-07 ENCOUNTER — Other Ambulatory Visit (HOSPITAL_COMMUNITY): Payer: Self-pay | Admitting: Interventional Radiology

## 2021-09-07 DIAGNOSIS — I771 Stricture of artery: Secondary | ICD-10-CM

## 2021-09-10 NOTE — Progress Notes (Signed)
I,[name],acting as a scribe for Lelon Huh, MD.,have documented all relevant documentation on the behalf of Lelon Huh, MD,as directed by  Lelon Huh, MD while in the presence of Lelon Huh, MD.  Established patient visit   Patient: Garrett Green   DOB: 07-20-1945   76 y.o. Male  MRN: [id number] Visit Date: 09/14/2021  Today's healthcare provider: Lelon Huh, MD   Chief Complaint  Patient presents with   Hypertension   Diabetes   Subjective    Hypertension, follow-up  BP Readings from Last 3 Encounters:  09/14/21 139/64  06/23/21 (!) 154/80  03/26/21 (!) 175/74   Wt Readings from Last 3 Encounters:  09/14/21 179 lb 9.6 oz (81.5 kg)  06/23/21 179 lb (81.2 kg)  03/26/21 174 lb (78.9 kg)     He was last seen for hypertension 06-23-21.  BP at that visit was 154/80. Management since that visit includes adding AmLODipine 2.5 MG tablet; Take 1 tablet daily. Encouraged healthy low sodium diet.    He reports excellent compliance with treatment. He is not having side effects. He is following a Regular diet. He is exercising. He does not smoke.  Use of agents associated with hypertension. None   Outside blood pressures are: Symptoms: No chest pain No chest pressure  No palpitations No syncope  No dyspnea No orthopnea  No paroxysmal nocturnal dyspnea No lower extremity edema   Pertinent labs Lab Results  Component Value Date   CHOL 123 12/23/2020   HDL 52 12/23/2020   LDLCALC 54 12/23/2020   TRIG 87 12/23/2020   CHOLHDL 2.4 12/23/2020   Lab Results  Component Value Date   NA 139 12/23/2020   K 5.0 12/23/2020   CREATININE 1.11 12/23/2020   EGFR 71 12/23/2020   GLUCOSE 128 (H) 12/23/2020     The ASCVD Risk score (Arnett DK, et al., 2019) failed to calculate for the following reasons:   The valid total cholesterol range is 130 to 320  mg/dL  --------------------------------------------------------------------------------------------------- Prediabetes, Follow-up  Lab Results  Component Value Date   HGBA1C 5.8 (A) 06/23/2021   HGBA1C 6.0 (H) 12/23/2020   HGBA1C 6.5 (H) 12/13/2019   GLUCOSE 128 (H) 12/23/2020   GLUCOSE 128 (H) 09/27/2019   GLUCOSE 105 (H) 07/09/2019    Last seen for predibetes 06-23-21. Management since that visit includes: Doing well with diet, a1c improving . Current symptoms include hypoglycemia  and have been unchanged. A couple episodes  Prior visit with dietician: no Current diet: in general, a "healthy" diet   Current exercise: yard work  Pertinent Labs:    Component Value Date/Time   CHOL 123 12/23/2020 0946   TRIG 87 12/23/2020 0946   CHOLHDL 2.4 12/23/2020 0946   CHOLHDL 4.2 10/18/2017 0555   CREATININE 1.11 12/23/2020 0946    Wt Readings from Last 3 Encounters:  09/14/21 179 lb 9.6 oz (81.5 kg)  06/23/21 179 lb (81.2 kg)  03/26/21 174 lb (78.9 kg)    -----------------------------------------------------------------------------------------    Medications: Outpatient Medications Prior to Visit  Medication Sig   amLODipine (NORVASC) 2.5 MG tablet Take 1 tablet (2.5 mg total) by mouth daily.   aspirin 325 MG tablet Take 325 mg by mouth daily.   atorvastatin (LIPITOR) 40 MG tablet Take 1 tablet (40 mg total) by mouth daily.   brimonidine (ALPHAGAN P) 0.1 % SOLN Place 1 drop into both eyes 2 (two) times daily.   Bromfenac Sodium (PROLENSA) 0.07 % SOLN Place 1 drop  into the left eye daily.   clopidogrel (PLAVIX) 75 MG tablet TAKE 1 TABLET BY MOUTH EVERY DAY   dorzolamide (TRUSOPT) 2 % ophthalmic solution INSTILL 1 DROP INTO LEFT EYE TWICE A DAY   fluticasone (FLONASE) 50 MCG/ACT nasal spray Place 2 sprays into both nostrils daily as needed for allergies.   HYDROcodone-acetaminophen (NORCO/VICODIN) 5-325 MG tablet Take 1 tablet by mouth every 6 (six) hours as needed.    loratadine (CLARITIN) 10 MG tablet Take 10 mg by mouth daily as needed for allergies.   prednisoLONE acetate (PRED FORTE) 1 % ophthalmic suspension Place 1 drop into the left eye daily. (Patient taking differently: Place 1 drop into the left eye in the morning, at noon, in the evening, and at bedtime.)   ramipril (ALTACE) 10 MG capsule TAKE 1 CAPSULE BY MOUTH  DAILY   zolpidem (AMBIEN) 10 MG tablet Take 0.5-1 tablets (5-10 mg total) by mouth at bedtime as needed for sleep.   No facility-administered medications prior to visit.       Objective    BP 139/64 (BP Location: Right Arm, Patient Position: Sitting, Cuff Size: Normal)   Pulse 63   Temp 97.6 F (36.4 C) (Oral)   Resp 16   Wt 179 lb 9.6 oz (81.5 kg)   SpO2 100%   BMI 27.31 kg/m    Physical Exam   General: Appearance:     Well developed, well nourished male in no acute distress  Eyes:    PERRL, conjunctiva/corneas clear, EOM's intact       Lungs:     Clear to auscultation bilaterally, respirations unlabored  Heart:    Normal heart rate. Normal rhythm. No murmurs, rubs, or gallops.    MS:   All extremities are intact.    Neurologic:   Awake, alert, oriented x 3. No apparent focal neurological defect.         Results for orders placed or performed in visit on 09/14/21  POCT glycosylated hemoglobin (Hb A1C)  Result Value Ref Range   Hemoglobin A1C 6.1 (A) 4.0 - 5.6 %   Est. average glucose Bld gHb Est-mCnc 128     Assessment & Plan     1. Prediabetes Fairly well controlled with diet.   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  Dispense: 120 tablet; Refill: 0  3. Insomnia, unspecified type refill zolpidem (AMBIEN) 10 MG tablet; Take 0.5-1 tablets (5-10 mg total) by mouth at bedtime as needed for sleep.  Dispense: 90 tablet; Refill: 1  4. Fear of flying refill  LORazepam (ATIVAN) 1 MG tablet; Take 1 tablet (1 mg total) by mouth 3 (three) times daily as needed.  Dispense: 10 tablet; Refill: 1      The entirety of the information documented in the History of Present Illness, Review of Systems and Physical Exam were personally obtained by me. Portions of this information were initially documented by the CMA and reviewed by me for thoroughness and accuracy.     Lelon Huh, MD  [facility] [phone] (phone) [phone] (fax)  [place]

## 2021-09-14 ENCOUNTER — Ambulatory Visit (INDEPENDENT_AMBULATORY_CARE_PROVIDER_SITE_OTHER): Payer: Medicare Other | Admitting: Family Medicine

## 2021-09-14 ENCOUNTER — Encounter: Payer: Self-pay | Admitting: Family Medicine

## 2021-09-14 VITALS — BP 139/64 | HR 63 | Temp 97.6°F | Resp 16 | Wt 179.6 lb

## 2021-09-14 DIAGNOSIS — G4700 Insomnia, unspecified: Secondary | ICD-10-CM

## 2021-09-14 DIAGNOSIS — F40243 Fear of flying: Secondary | ICD-10-CM

## 2021-09-14 DIAGNOSIS — M47816 Spondylosis without myelopathy or radiculopathy, lumbar region: Secondary | ICD-10-CM

## 2021-09-14 DIAGNOSIS — R7303 Prediabetes: Secondary | ICD-10-CM

## 2021-09-14 LAB — POCT GLYCOSYLATED HEMOGLOBIN (HGB A1C)
Est. average glucose Bld gHb Est-mCnc: 128
Hemoglobin A1C: 6.1 % — AB (ref 4.0–5.6)

## 2021-09-14 MED ORDER — ZOLPIDEM TARTRATE 10 MG PO TABS: 5.0000 mg | ORAL_TABLET | Freq: Every evening | ORAL | 1 refills | Status: DC | PRN

## 2021-09-14 MED ORDER — LORAZEPAM 1 MG PO TABS: 1.0000 mg | ORAL_TABLET | Freq: Three times a day (TID) | ORAL | 1 refills | Status: DC | PRN

## 2021-09-14 MED ORDER — HYDROCODONE-ACETAMINOPHEN 5-325 MG PO TABS: 1.0000 | ORAL_TABLET | Freq: Four times a day (QID) | ORAL | 0 refills | Status: DC | PRN

## 2021-09-14 NOTE — Patient Instructions (Signed)
.   Please review the attached list of medications and notify my office if there are any errors.   . Please bring all of your medications to every appointment so we can make sure that our medication list is the same as yours.   

## 2021-09-20 ENCOUNTER — Ambulatory Visit: Payer: Self-pay | Admitting: *Deleted

## 2021-09-20 NOTE — Telephone Encounter (Signed)
Patient advised. Appointment scheduled.  

## 2021-09-20 NOTE — Telephone Encounter (Signed)
  Chief Complaint: sinus pressure and drainage Symptoms: sinus pressure- forehead, eye, sinus drainage in throat Frequency: started Saturday Pertinent Negatives: Patient denies fever, congestion Disposition: '[]'$ ED /'[]'$ Urgent Care (no appt availability in office) / '[]'$ Appointment(In office/virtual)/ '[]'$  [facility]/ '[x]'$ Home Care/ '[]'$ Refused Recommended Disposition /'[]'$ [facility]/ '[]'$  Follow-up with PCP Additional Notes: Patient is requesting treatment- prednisone for sinus pressure. Advised needs appointment- but patient states he was just in office 09/14/21 and request provider call in medication for sinus infection.

## 2021-09-20 NOTE — Telephone Encounter (Signed)
Summary: advice - sinus infection   Pt called in stating he has sinus infection, caller was requesting a prednisone pack be sent in, pt stated he was recently seen, pt needed advice.      Reason for Disposition  [1] Sinus congestion as part of a cold AND [2] present < 10 days  Answer Assessment - Initial Assessment Questions 1. LOCATION: "Where does it hurt?"      Pain and pressure forehead, eyes 2. ONSET: "When did the sinus pain start?"  (e.g., hours, days)      Saturday 3. SEVERITY: "How bad is the pain?"   (Scale 1-10; mild, moderate or severe)   - MILD (1-3): doesn't interfere with normal activities    - MODERATE (4-7): interferes with normal activities (e.g., work or school) or awakens from sleep   - SEVERE (8-10): excruciating pain and patient unable to do any normal activities        Mild/moderate 4. RECURRENT SYMPTOM: "Have you ever had sinus problems before?" If Yes, ask: "When was the last time?" and "What happened that time?"      Yes- seasonal - antibiotic/prednisone  5. NASAL CONGESTION: "Is the nose blocked?" If Yes, ask: "Can you open it or must you breathe through your mouth?"     Not blocked- drainage present in throat 6. NASAL DISCHARGE: "Do you have discharge from your nose?" If so ask, "What color?"     no 7. FEVER: "Do you have a fever?" If Yes, ask: "What is it, how was it measured, and when did it start?"      no 8. OTHER SYMPTOMS: "Do you have any other symptoms?" (e.g., sore throat, cough, earache, difficulty breathing)     Sinus pain, drainage- throat irritation, feels bad 9. PREGNANCY: "Is there any chance you are pregnant?" "When was your last menstrual period?"  Protocols used: Sinus Pain or Congestion-A-AH

## 2021-09-20 NOTE — Telephone Encounter (Signed)
Needs office visit.

## 2021-09-21 ENCOUNTER — Encounter: Payer: Self-pay | Admitting: Family Medicine

## 2021-09-21 ENCOUNTER — Ambulatory Visit (INDEPENDENT_AMBULATORY_CARE_PROVIDER_SITE_OTHER): Payer: Medicare Other | Admitting: Family Medicine

## 2021-09-21 ENCOUNTER — Ambulatory Visit (HOSPITAL_COMMUNITY)
Admission: RE | Admit: 2021-09-21 | Discharge: 2021-09-21 | Disposition: A | Discharge status: Home / Self Care | Payer: Medicare Other | Source: Ambulatory Visit | Attending: Interventional Radiology | Admitting: Interventional Radiology

## 2021-09-21 VITALS — BP 123/82 | HR 76 | Temp 97.6°F | Resp 16 | Wt 177.8 lb

## 2021-09-21 DIAGNOSIS — I6623 Occlusion and stenosis of bilateral posterior cerebral arteries: Secondary | ICD-10-CM | POA: Diagnosis not present

## 2021-09-21 DIAGNOSIS — R202 Paresthesia of skin: Secondary | ICD-10-CM | POA: Diagnosis not present

## 2021-09-21 DIAGNOSIS — I6603 Occlusion and stenosis of bilateral middle cerebral arteries: Secondary | ICD-10-CM | POA: Insufficient documentation

## 2021-09-21 DIAGNOSIS — I771 Stricture of artery: Secondary | ICD-10-CM | POA: Insufficient documentation

## 2021-09-21 DIAGNOSIS — J323 Chronic sphenoidal sinusitis: Secondary | ICD-10-CM | POA: Diagnosis not present

## 2021-09-21 DIAGNOSIS — J329 Chronic sinusitis, unspecified: Secondary | ICD-10-CM

## 2021-09-21 DIAGNOSIS — Z8673 Personal history of transient ischemic attack (TIA), and cerebral infarction without residual deficits: Secondary | ICD-10-CM | POA: Insufficient documentation

## 2021-09-21 DIAGNOSIS — R9089 Other abnormal findings on diagnostic imaging of central nervous system: Secondary | ICD-10-CM | POA: Diagnosis not present

## 2021-09-21 MED ORDER — CEFDINIR 300 MG PO CAPS: 600.0000 mg | ORAL_CAPSULE | Freq: Every day | ORAL | 0 refills | Status: AC

## 2021-09-21 MED ORDER — PREDNISONE 10 MG PO TABS: ORAL_TABLET | ORAL | 0 refills | Status: AC

## 2021-09-21 NOTE — Progress Notes (Signed)
I,Garrett Green,acting as a scribe for Garrett Huh, MD.,have documented all relevant documentation on the behalf of Garrett Huh, MD,as directed by  Garrett Huh, MD while in the presence of Garrett Huh, MD.  Established patient visit   Patient: Garrett Green   DOB: 02/24/1946   76 y.o. Male  MRN: [id number] Visit Date: 09/21/2021  Today's healthcare provider: Lelon Huh, MD   Chief Complaint  Patient presents with   Sinus Problem  CC: sinus pressure/drainage  Subjective     Garrett Green is a 76 yr old male presenting for sinus pressure and drainage.  Pressure in forehead, headache, eyes burning/red and sinus drainage in throat. Onset of symptoms Friday. Patient denies fever, sore throat, cough, earache, difficulty breathing.  Taking [facility] sinus daytime /nite time with some temporary relief. Has also been using fluticasone nasal spray all spring, but is not helping with current sinus congestion.    Medications: Outpatient Medications Prior to Visit  Medication Sig   amLODipine (NORVASC) 2.5 MG tablet Take 1 tablet (2.5 mg total) by mouth daily.   aspirin 325 MG tablet Take 325 mg by mouth daily.   atorvastatin (LIPITOR) 40 MG tablet Take 1 tablet (40 mg total) by mouth daily.   brimonidine (ALPHAGAN P) 0.1 % SOLN Place 1 drop into both eyes 2 (two) times daily.   Bromfenac Sodium (PROLENSA) 0.07 % SOLN Place 1 drop into the left eye daily.   clopidogrel (PLAVIX) 75 MG tablet TAKE 1 TABLET BY MOUTH EVERY DAY   dorzolamide (TRUSOPT) 2 % ophthalmic solution INSTILL 1 DROP INTO LEFT EYE TWICE A DAY   fluticasone (FLONASE) 50 MCG/ACT nasal spray Place 2 sprays into both nostrils daily as needed for allergies.   HYDROcodone-acetaminophen (NORCO/VICODIN) 5-325 MG tablet Take 1 tablet by mouth every 6 (six) hours as needed.   loratadine (CLARITIN) 10 MG tablet Take 10 mg by mouth daily as needed for allergies.   LORazepam (ATIVAN) 1 MG tablet Take 1 tablet (1 mg total) by  mouth 3 (three) times daily as needed.   prednisoLONE acetate (PRED FORTE) 1 % ophthalmic suspension Place 1 drop into the left eye daily. (Patient taking differently: Place 1 drop into the left eye in the morning, at noon, in the evening, and at bedtime.)   ramipril (ALTACE) 10 MG capsule TAKE 1 CAPSULE BY MOUTH  DAILY   zolpidem (AMBIEN) 10 MG tablet Take 0.5-1 tablets (5-10 mg total) by mouth at bedtime as needed for sleep.   No facility-administered medications prior to visit.    Review of Systems  HENT:  Positive for congestion, sinus pressure and sinus pain.   Eyes:  Positive for discharge and redness.  Allergic/Immunologic: Positive for environmental allergies.  Neurological:  Positive for dizziness.       Objective    BP 123/82 (BP Location: Right Arm, Patient Position: Sitting, Cuff Size: Normal)   Pulse 76   Temp 97.6 F (36.4 C) (Oral)   Resp 16   Wt 177 lb 12.8 oz (80.6 kg)   SpO2 98%   BMI 27.03 kg/m    Physical Exam   General Appearance:     Well developed, well nourished male, alert, cooperative, in no acute distress  HENT:   bilateral TM normal without fluid or infection, neck without nodes, frontal and maxillary sinus tender, post nasal drip noted, and nasal mucosa congested  Eyes:    PERRL, conjunctiva/corneas clear, EOM's intact       Lungs:  Clear to auscultation bilaterally, respirations unlabored  Heart:    Normal heart rate. Normal rhythm. No murmurs, rubs, or gallops.    Neurologic:   Awake, alert, oriented x 3. No apparent focal neurological           defect.        Assessment & Plan     1. Sinusitis, unspecified chronicity, unspecified location  - cefdinir (OMNICEF) 300 MG capsule; Take 2 capsules (600 mg total) by mouth daily for 7 days.  Dispense: 14 capsule; Refill: 0 - predniSONE (DELTASONE) 10 MG tablet; 6 tablets for 1 day, then 5 for 1 day, then 4 for 1 day, then 3 for 1 day, then 2 for 1 day then 1 for 1 day.  Dispense: 21 tablet;  Refill: 0       The entirety of the information documented in the History of Present Illness, Review of Systems and Physical Exam were personally obtained by me. Portions of this information were initially documented by the CMA and reviewed by me for thoroughness and accuracy.     Garrett Huh, MD  [facility] [phone] (phone) [phone] (fax)  [place]

## 2021-09-28 ENCOUNTER — Other Ambulatory Visit (HOSPITAL_COMMUNITY): Payer: Self-pay | Admitting: Interventional Radiology

## 2021-09-28 DIAGNOSIS — I771 Stricture of artery: Secondary | ICD-10-CM

## 2021-09-29 ENCOUNTER — Ambulatory Visit: Payer: Medicare Other | Admitting: Family Medicine

## 2021-09-29 ENCOUNTER — Ambulatory Visit: Payer: Medicare Other | Admitting: Urology

## 2021-09-30 ENCOUNTER — Other Ambulatory Visit: Payer: Self-pay | Admitting: Family Medicine

## 2021-10-04 ENCOUNTER — Other Ambulatory Visit (INDEPENDENT_AMBULATORY_CARE_PROVIDER_SITE_OTHER): Payer: Self-pay | Admitting: Ophthalmology

## 2021-10-04 IMAGING — MR MR HEAD W/O CM
10 of 16 series · 26 of 48 positions shown · non-contrast
Comparison: Head MRA 09/10/2019 and head MRI 11/23/2018

CLINICAL DATA: Follow-up intracranial arterial stenoses. History of
stroke.

EXAM:
MRI HEAD WITHOUT CONTRAST
MRA HEAD WITHOUT CONTRAST
TECHNIQUE: Multiplanar, multiecho pulse sequences of the brain and surrounding
structures were obtained without intravenous contrast. Angiographic
images of the head were obtained using MRA technique without
contrast.

[Series 5: DWI · axial · 3.0mm · 0.88mm/px · z∈[-88,+53]mm · 6 of 96 slices shown (1 of 4)]
[im 1/96]
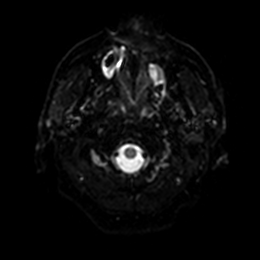
[im 20/96]
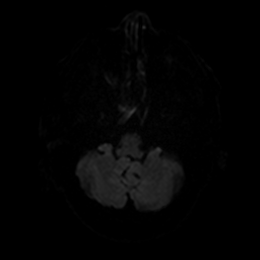
[im 39/96]
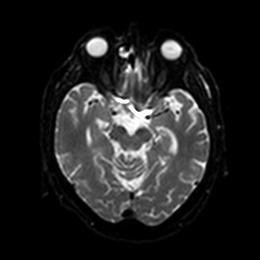
[im 58/96]
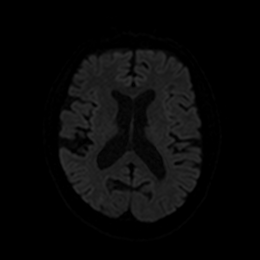
[im 77/96]
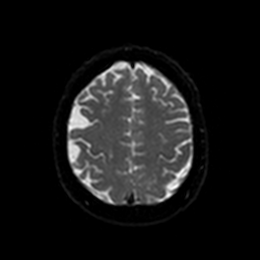
[im 96/96]
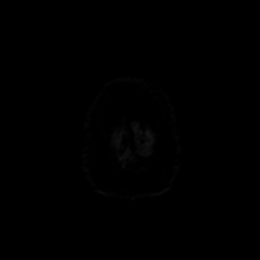

[Series 6: DWI · axial · 3.0mm · 0.88mm/px · z∈[-88,+53]mm · 3 of 48 slices shown (2 of 4)]
[im 1/48]
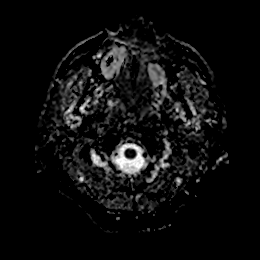
[im 24/48]
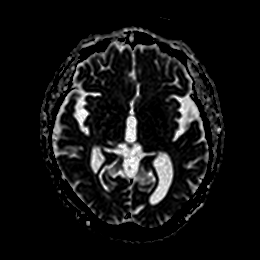
[im 48/48]
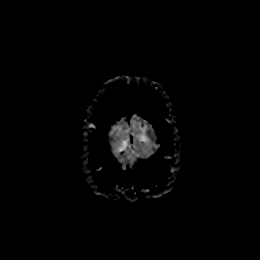

[Series 7: DWI · coronal · 4.0mm · 0.88mm/px · 4 of 64 slices shown (3 of 4)]
[im 1/64]
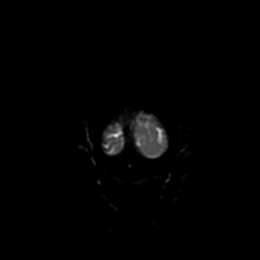
[im 22/64]
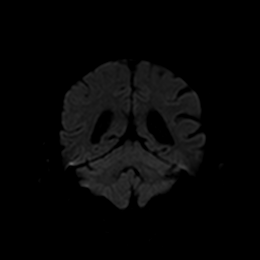
[im 43/64]
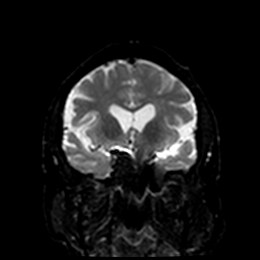
[im 64/64]
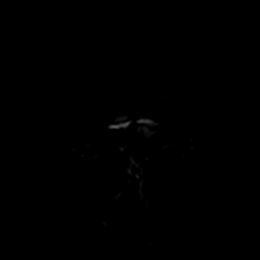

[Series 8: DWI · coronal · 4.0mm · 0.88mm/px · 2 of 32 slices shown (4 of 4)]
[im 1/32]
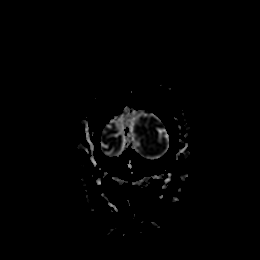
[im 32/32]
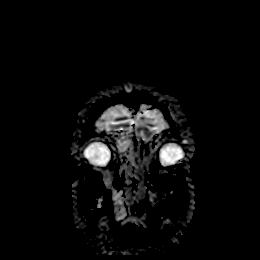

[Series 13: T1 · sagittal · 5.0mm · 0.75mm/px · 1 of 23 slices shown]
[im 1/23]
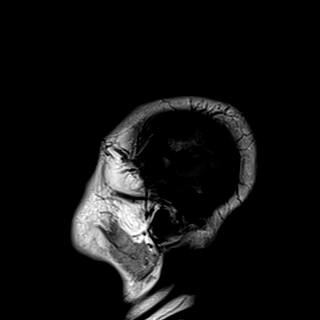

[Series 14: T2 · axial · 5.0mm · 0.72mm/px · 1 of 25 slices shown (1 of 2)]
[im 1/25]
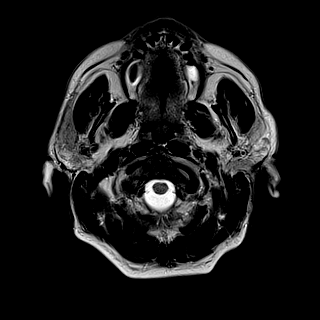

[Series 15: FLAIR · axial · 5.0mm · 0.45mm/px · 1 of 25 slices shown]
[im 1/25]
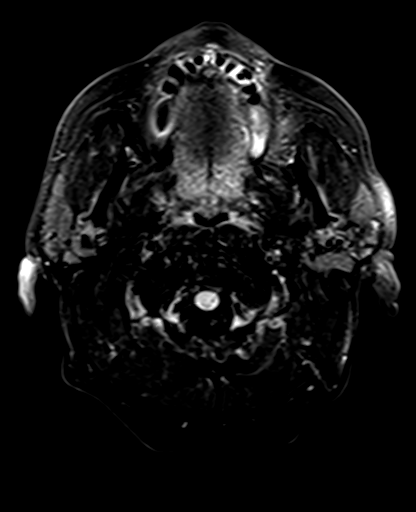

[Series 17: pha_images · axial · 3.0mm · 0.90mm/px · z∈[-101,+75]mm · 3 of 56 slices shown]
[im 1/56]
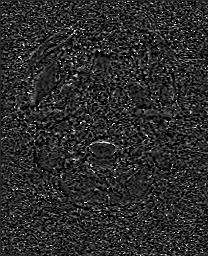
[im 28/56]
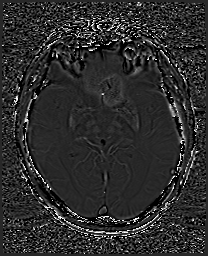
[im 56/56]
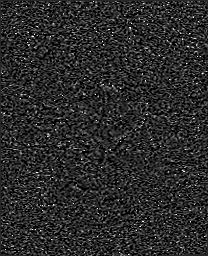

[Series 18: swi_images · axial · 3.0mm · 0.90mm/px · z∈[-101,+75]mm · 3 of 60 slices shown]
[im 1/60]
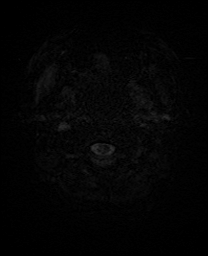
[im 30/60]
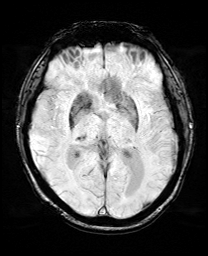
[im 60/60]
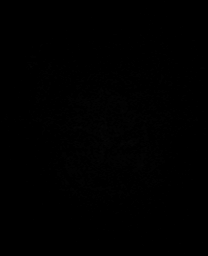

[Series 21: T2 · coronal · 5.0mm · 0.34mm/px · 2 of 29 slices shown (2 of 2)]
[im 1/29]
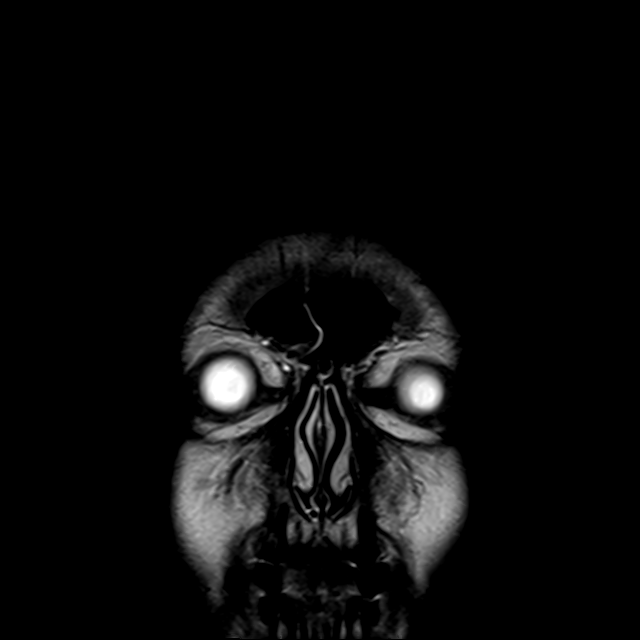
[im 29/29]
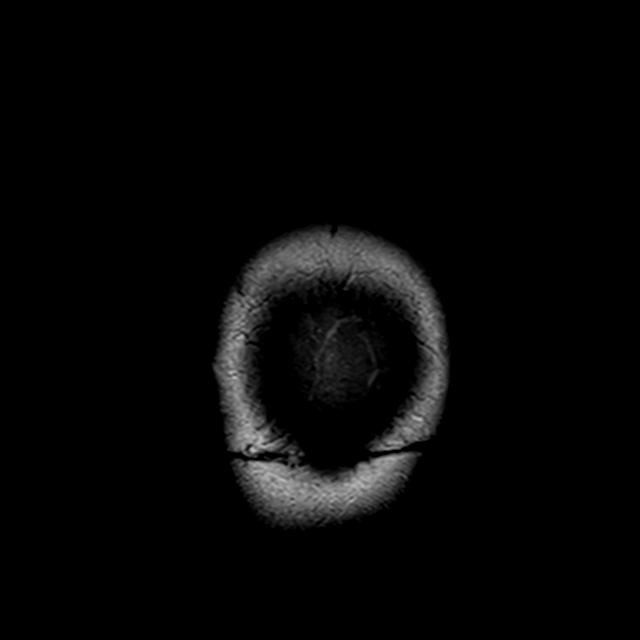

[26 of 48 positions shown; findings below may reference images not displayed]

FINDINGS: MRI HEAD FINDINGS

Brain: No acute infarct, mass, midline shift, or extra-axial fluid
collection is identified. A chronic lacunar infarct is again noted
in the right thalamus with a small amount of associated chronic
blood products. There are also 2 foci of chronic microhemorrhage in
the right parietal lobe. T2 hyperintensities in the cerebral white
matter bilaterally are unchanged and nonspecific but compatible with
minimal chronic small vessel ischemic disease. Mild to moderate
cerebral atrophy is unchanged.

Vascular: Major intracranial vascular flow voids are preserved.

Skull and upper cervical spine: Unremarkable bone marrow signal air

Sinuses/Orbits: Bilateral cataract extraction. Chronic opacification
of the right sphenoid sinus by complex material. Increased, moderate
bilateral ethmoid air cell mucosal thickening. New mild mucosal
thickening in the right frontal and both maxillary sinuses with
small amount of fluid in the right maxillary sinus. New small right
mastoid effusion.

Other: None.

MRA HEAD FINDINGS

The visualized distal vertebral arteries are widely patent to the
basilar with the left being slightly dominant. Patent bilateral
PICAs, a right AICA, and bilateral SCAs are visualized. The basilar
artery is widely patent. Posterior communicating arteries are not
identified and may be diminutive or absent. The PCAs are patent with
unchanged mild distal left P1 and severe bilateral P2 stenoses.

The internal carotid arteries are patent from skull base to carotid
termini without evidence of a significant stenosis. ACAs and MCAs
are patent without evidence of a proximal branch occlusion or
significant A1 stenosis. Severe distal right M1 and mild distal left
M1 stenoses are similar to the prior MRA. No aneurysm is identified.
IMPRESSION: 1. No acute intracranial abnormality.
2. Chronic right thalamic infarct.
3. Unchanged head MRA including severe right M1 and bilateral P2
stenoses.

## 2021-10-08 ENCOUNTER — Ambulatory Visit (HOSPITAL_COMMUNITY): Payer: Medicare Other

## 2021-10-19 ENCOUNTER — Ambulatory Visit (HOSPITAL_COMMUNITY)
Admission: RE | Admit: 2021-10-19 | Discharge: 2021-10-19 | Disposition: A | Discharge status: Home / Self Care | Payer: Medicare Other | Source: Ambulatory Visit | Attending: Interventional Radiology | Admitting: Interventional Radiology

## 2021-10-19 DIAGNOSIS — I771 Stricture of artery: Secondary | ICD-10-CM

## 2021-10-20 HISTORY — PX: IR RADIOLOGIST EVAL & MGMT: IMG5224

## 2021-10-21 ENCOUNTER — Other Ambulatory Visit (HOSPITAL_COMMUNITY): Payer: Self-pay | Admitting: Interventional Radiology

## 2021-10-21 DIAGNOSIS — I771 Stricture of artery: Secondary | ICD-10-CM

## 2021-10-28 ENCOUNTER — Other Ambulatory Visit: Payer: Self-pay | Admitting: Student

## 2021-10-28 DIAGNOSIS — I6381 Other cerebral infarction due to occlusion or stenosis of small artery: Secondary | ICD-10-CM

## 2021-10-29 ENCOUNTER — Encounter (HOSPITAL_COMMUNITY): Payer: Self-pay

## 2021-10-29 ENCOUNTER — Other Ambulatory Visit (HOSPITAL_COMMUNITY): Payer: Self-pay | Admitting: Interventional Radiology

## 2021-10-29 ENCOUNTER — Other Ambulatory Visit: Payer: Self-pay

## 2021-10-29 ENCOUNTER — Ambulatory Visit (HOSPITAL_COMMUNITY)
Admission: RE | Admit: 2021-10-29 | Discharge: 2021-10-29 | Disposition: A | Discharge status: Home / Self Care | Payer: Medicare Other | Source: Ambulatory Visit | Attending: Interventional Radiology | Admitting: Interventional Radiology

## 2021-10-29 DIAGNOSIS — Z8673 Personal history of transient ischemic attack (TIA), and cerebral infarction without residual deficits: Secondary | ICD-10-CM | POA: Diagnosis not present

## 2021-10-29 DIAGNOSIS — R202 Paresthesia of skin: Secondary | ICD-10-CM | POA: Diagnosis not present

## 2021-10-29 DIAGNOSIS — I6603 Occlusion and stenosis of bilateral middle cerebral arteries: Secondary | ICD-10-CM | POA: Diagnosis not present

## 2021-10-29 DIAGNOSIS — Z7982 Long term (current) use of aspirin: Secondary | ICD-10-CM | POA: Insufficient documentation

## 2021-10-29 DIAGNOSIS — Z7902 Long term (current) use of antithrombotics/antiplatelets: Secondary | ICD-10-CM | POA: Diagnosis not present

## 2021-10-29 DIAGNOSIS — I771 Stricture of artery: Secondary | ICD-10-CM

## 2021-10-29 DIAGNOSIS — I6381 Other cerebral infarction due to occlusion or stenosis of small artery: Secondary | ICD-10-CM

## 2021-10-29 HISTORY — PX: IR US GUIDE VASC ACCESS RIGHT: IMG2390

## 2021-10-29 HISTORY — PX: IR ANGIO VERTEBRAL SEL VERTEBRAL BILAT MOD SED: IMG5369

## 2021-10-29 HISTORY — PX: IR ANGIO INTRA EXTRACRAN SEL COM CAROTID INNOMINATE BILAT MOD SED: IMG5360

## 2021-10-29 LAB — BASIC METABOLIC PANEL
Anion gap: 11 (ref 5–15)
BUN: 18 mg/dL (ref 8–23)
CO2: 22 mmol/L (ref 22–32)
Calcium: 9 mg/dL (ref 8.9–10.3)
Chloride: 106 mmol/L (ref 98–111)
Creatinine, Ser: 1.13 mg/dL (ref 0.61–1.24)
GFR, Estimated: >60 mL/min (ref >60)
Glucose, Bld: 126 mg/dL — ABNORMAL HIGH (ref 70–99)
Potassium: 4.4 mmol/L (ref 3.5–5.1)
Sodium: 139 mmol/L (ref 135–145)

## 2021-10-29 LAB — PROTIME-INR
INR: 1 (ref 0.8–1.2)
Prothrombin Time: 13.4 seconds (ref 11.4–15.2)

## 2021-10-29 MED ORDER — FENTANYL CITRATE (PF) 100 MCG/2ML IJ SOLN
INTRAMUSCULAR | Status: AC | PRN
  Administered 2021-10-29: 25 ug via INTRAVENOUS

## 2021-10-29 MED ORDER — NITROGLYCERIN 1 MG/10 ML FOR IR/CATH LAB
INTRA_ARTERIAL | Status: AC | PRN
  Administered 2021-10-29: 200 ug via INTRA_ARTERIAL

## 2021-10-29 MED ORDER — MIDAZOLAM HCL 2 MG/2ML IJ SOLN
INTRAMUSCULAR | Status: AC
  Filled 2021-10-29: qty 2

## 2021-10-29 MED ORDER — HEPARIN SODIUM (PORCINE) 1000 UNIT/ML IJ SOLN
INTRAMUSCULAR | Status: AC
  Filled 2021-10-29: qty 10

## 2021-10-29 MED ORDER — SODIUM CHLORIDE 0.9 % IV SOLN: INTRAVENOUS | Status: DC

## 2021-10-29 MED ORDER — LIDOCAINE HCL 1 % IJ SOLN
INTRAMUSCULAR | Status: AC
  Administered 2021-10-29: 5 mL
  Filled 2021-10-29: qty 20

## 2021-10-29 MED ORDER — NITROGLYCERIN 1 MG/10 ML FOR IR/CATH LAB
INTRA_ARTERIAL | Status: AC
  Filled 2021-10-29: qty 10

## 2021-10-29 MED ORDER — FENTANYL CITRATE (PF) 100 MCG/2ML IJ SOLN
INTRAMUSCULAR | Status: AC
  Filled 2021-10-29: qty 2

## 2021-10-29 MED ORDER — VERAPAMIL HCL 2.5 MG/ML IV SOLN
INTRAVENOUS | Status: AC
  Filled 2021-10-29: qty 2

## 2021-10-29 MED ORDER — IOHEXOL 300 MG/ML  SOLN
100.0000 mL | Freq: Once | INTRAMUSCULAR | Status: AC | PRN
  Administered 2021-10-29: 64 mL via INTRA_ARTERIAL

## 2021-10-29 MED ORDER — VERAPAMIL HCL 2.5 MG/ML IV SOLN: INTRA_ARTERIAL | Status: AC | PRN

## 2021-10-29 MED ORDER — ATROPINE SULFATE 1 MG/ML IV SOLN
INTRAVENOUS | Status: AC
  Filled 2021-10-29: qty 1

## 2021-10-29 MED ORDER — MIDAZOLAM HCL 2 MG/2ML IJ SOLN
INTRAMUSCULAR | Status: AC | PRN
  Administered 2021-10-29: 1 mg via INTRAVENOUS

## 2021-10-29 MED ORDER — SODIUM CHLORIDE 0.9 % IV SOLN: INTRAVENOUS | Status: AC

## 2021-10-29 NOTE — Sedation Documentation (Signed)
Pt transported to Short Stay with this RN and family. Pt alert and resting in bed. TR band assessed at bedside in short stay. No bleeding or hematoma present. Right radial pulse +2. Hand off of care completed

## 2021-10-29 NOTE — Procedures (Signed)
INR Status post four-vessel cerebral arteriogram. Right radial approach. Findings. 1.  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 2.  Approximately 50% stenosis left middle cerebral artery mid M1 segment. 3.  70% stenosis left posterior cerebral artery distal P1 segment, and a proximal 50% P3 segment. 4.  Approximately 50% stenosis of right PCA proximal P1 segment.  Arlean Hopping MD

## 2021-10-29 NOTE — H&P (Signed)
Chief Complaint: Patient was seen in consultation today for follow up MRI results  Supervising Physician: Luanne Bras  Patient Status: [facility] - Out-pt  History of Present Illness: Garrett Green is a 76 y.o. male known to [place] for follow up of intracranial stenoses. He's had multiple angiograms in the past following him since he's had hx of stroke with residual left sided paresthesias. He remains on ASA and Plavix in addition to all his other medications.  PMHx, meds, labs, imaging, allergies reviewed. Feels well, no recent fevers, chills, illness. Feels his left-sided weakness is overall the same, potentially mildly increased some days but not today.   He had recent MRI which showed concern for progression of stenosis at the right M1 segment.  This was discussed with Dr. Estanislado Pandy in consultation 10/19/21 and he presents to [facility] today for diagnostic angiogram.  He has been NPO.  His wife is available for transportation.    Past Medical History:  Diagnosis Date   COVID-19 07/2019   History of blood transfusion 2014   "related to back OR"   History of chicken pox    Hypertensive retinopathy    OU   Retinal detachment    OS   Stroke ([place])    TIA (transient ischemic attack) 10/17/2017; 10/19/2017    Past Surgical History:  Procedure Laterality Date   BACK SURGERY     CATARACT EXTRACTION Right 05/2019   Dr. Talbert Forest   CATARACT EXTRACTION Left 2017   CATARACT EXTRACTION W/ INTRAOCULAR LENS IMPLANT Left    COLONOSCOPY WITH PROPOFOL N/A 03/16/2016   Procedure: COLONOSCOPY WITH PROPOFOL;  Surgeon: Manya Silvas, MD;  Location: [place];  Service: Endoscopy;  Laterality: N/A;   ESOPHAGOGASTRODUODENOSCOPY (EGD) WITH ESOPHAGEAL DILATION  1990s   EYE SURGERY Bilateral    Cat Sx   IR ANGIO INTRA EXTRACRAN SEL COM CAROTID INNOMINATE BILAT MOD SED  10/31/2017   IR ANGIO INTRA EXTRACRAN SEL COM CAROTID INNOMINATE BILAT MOD SED  01/15/2019   IR ANGIO INTRA EXTRACRAN  SEL COM CAROTID INNOMINATE BILAT MOD SED  09/27/2019   IR ANGIO VERTEBRAL SEL VERTEBRAL BILAT MOD SED  10/31/2017   IR ANGIO VERTEBRAL SEL VERTEBRAL BILAT MOD SED  01/15/2019   IR ANGIO VERTEBRAL SEL VERTEBRAL BILAT MOD SED  09/27/2019   IR RADIOLOGIST EVAL & MGMT  10/20/2021   IR US GUIDE VASC ACCESS RIGHT  01/15/2019   IR US GUIDE VASC ACCESS RIGHT  09/27/2019   LUMBAR LAMINECTOMY/DECOMPRESSION MICRODISCECTOMY Left 07/26/2012   Procedure: LUMBAR LAMINECTOMY/DECOMPRESSION MICRODISCECTOMY 1 LEVEL;  Surgeon: Eustace Moore, MD;  Location: [place] NEURO ORS;  Service: Neurosurgery;  Laterality: Left;  lumbar five sacral one   RETINAL DETACHMENT SURGERY Left 2016   Dr. Posey Pronto   SHOULDER ARTHROSCOPY WITH OPEN ROTATOR CUFF REPAIR Left 1990s   TONSILLECTOMY     YAG LASER APPLICATION Left [id number]   Dr. Talbert Forest    Allergies: Sulfa antibiotics and Clavulanic acid  Medications: Prior to Admission medications   Medication Sig Start Date End Date Taking? Authorizing Provider  aspirin 325 MG tablet Take 325 mg by mouth daily.    [provider]  atorvastatin (LIPITOR) 40 MG tablet Take 1 tablet (40 mg total) by mouth daily at 6 PM. 07/12/19   Fisher, Kirstie Peri, MD  brimonidine (ALPHAGAN P) 0.1 % SOLN Place 1 drop into the left eye 2 (two) times daily.    [provider]  clopidogrel (PLAVIX) 75 MG tablet Take 1 tablet (75  mg total) by mouth daily. 07/12/19   Birdie Sons, MD  fluticasone (FLONASE) 50 MCG/ACT nasal spray Place 2 sprays into both nostrils daily as needed for allergies. 11/30/16   Birdie Sons, MD  HYDROcodone-acetaminophen (NORCO/VICODIN) 5-325 MG tablet Take 1 tablet by mouth every 6 (six) hours as needed. 06/26/19   Birdie Sons, MD  latanoprost (XALATAN) 0.005 % ophthalmic solution Place 1 drop into both eyes at bedtime.    [provider]  loratadine (CLARITIN) 10 MG tablet Take 10 mg by mouth daily as needed for allergies.    [provider]  LORazepam  (ATIVAN) 1 MG tablet Take 1 tablet (1 mg total) by mouth 3 (three) times daily as needed. Patient taking differently: Take 1 mg by mouth as directed. Only for flying 11/30/16   Birdie Sons, MD  ramipril (ALTACE) 10 MG capsule TAKE 1 CAPSULE BY MOUTH  DAILY 08/17/19   Birdie Sons, MD  zolpidem (AMBIEN) 10 MG tablet Take 0.5-1 tablets (5-10 mg total) by mouth at bedtime as needed for sleep. 12/11/18   Birdie Sons, MD     Family History  Problem Relation Age of Onset   Heart disease Mother    Cancer Sister    Dementia Brother    Dementia Sister    Diabetes Brother    Diabetes Sister    Colon cancer Neg Hx    Prostate cancer Neg Hx     Social History   Socioeconomic History   Marital status: Married    Spouse name: Not on file   Number of children: Not on file   Years of education: Not on file   Highest education level: Not on file  Occupational History    Employer: RETIRED  Tobacco Use   Smoking status: Former    Packs/day: 1.00    Years: 25.00    Total pack years: 25.00    Types: Cigarettes    Quit date: 03/03/1988    Years since quitting: 33.6   Smokeless tobacco: Never  Vaping Use   Vaping Use: Never used  Substance and Sexual Activity   Alcohol use: Yes    Alcohol/week: 1.0 standard drink of alcohol    Types: 1 Standard drinks or equivalent per week   Drug use: Never   Sexual activity: Yes  Other Topics Concern   Not on file  Social History Narrative   Not on file   Social Determinants of Health   Financial Resource Strain: Not on file  Food Insecurity: Not on file  Transportation Needs: Not on file  Physical Activity: Not on file  Stress: Not on file  Social Connections: Not on file     Review of Systems: A 12 point ROS discussed and pertinent positives are indicated in the HPI above.  All other systems are negative.  Review of Systems  Constitutional:  Negative for fatigue and fever.  Eyes:  Positive for redness (right, chronic).   Respiratory:  Negative for cough and shortness of breath.   Cardiovascular:  Negative for chest pain.  Gastrointestinal:  Negative for abdominal pain.  Musculoskeletal:  Negative for back pain.  Psychiatric/Behavioral:  Negative for behavioral problems and confusion.     Vital Signs: BP 126/70   Pulse (!) 56   Temp 97.7 F (36.5 C) (Temporal)   Resp 18   Ht '5\' 8"'$  (1.727 m)   Wt 180 lb (81.6 kg)   SpO2 100%   BMI 27.37 kg/m  Physical Exam Vitals and nursing note reviewed.  Constitutional:      General: He is not in acute distress.    Appearance: Normal appearance. He is not ill-appearing.  HENT:     Mouth/Throat:     Mouth: Mucous membranes are moist.     Pharynx: Oropharynx is clear.  Cardiovascular:     Rate and Rhythm: Normal rate and regular rhythm.     Pulses: Normal pulses.     Heart sounds: Normal heart sounds.  Pulmonary:     Effort: Pulmonary effort is normal. No respiratory distress.     Breath sounds: Normal breath sounds.  Skin:    General: Skin is warm and dry.  Neurological:     Mental Status: He is alert and oriented to person, place, and time.     Motor: Weakness (left-sided, chronic, stable) present.  Psychiatric:        Mood and Affect: Mood normal.        Thought Content: Thought content normal.        Judgment: Judgment normal.     Imaging: IR Radiologist Eval & Mgmt  Result Date: 10/20/2021 EXAM: ESTABLISHED PATIENT OFFICE VISIT CHIEF COMPLAINT: Follow-up to the results of a recent MRI MRA of the brain. Current Pain Level: 1-10 HISTORY OF PRESENT ILLNESS: Patient is a 76 year old right handed gentleman who returns in follow-up accompanied by his spouse to discuss the results of a recent follow-up MRI MRA of the brain. Clinically, the patient reports continued symptoms of left-sided upper and lower extremity tightness associated with an exercise/work induced weakness in both his left arm and leg. He reports the symptoms to be persistent though  transitory. There is no associated symptoms of visual aberrations such as blurred vision, diplopia, or blindness. There are no symptoms of speech difficulties. However, during these spells he does have difficulty with walking. Symptoms may last a few minutes. Patient in addition also reports symptoms of dizziness and vertigo while laying supine and turning his head from side to side. Similar though less pronounced in nature may occur while patient is standing up from a lying position. No history of seizures or of loss of consciousness or loss of awareness. Patient denies any symptoms of nausea or vomiting, difficulty swallowing, perioral numbness, or of drop attacks. He reports his diabetes to be reasonably well controlled with A1C to be in the range of 6-6.1. He presently takes no medications for diabetes control. He denies any recent chest pain, shortness of breath or palpitations. Denies any symptoms of difficulty breathing, abnormal snoring, wheezing, or of hemoptysis. Reports his appetite is normal.  Weight is steady. Denies any symptoms of dysuria, hematuria, or polyuria. Denies any constipation or diarrhea. Denies any recent symptoms of chills, fever or rigors. Diagnosis * : Date . * : [id number] * : 07/2019 . * : History of blood transfusion * : 2014 * : "related to back OR" . * : History of chicken pox * : . * : Hypertensive retinopathy * : * : OU . * : Retinal detachment * : * : OS . * : Stroke ([place]) * : . * : TIA (transient ischemic attack) * : 10/17/2017; 10/19/2017 Past Surgical History: Procedure * : Laterality * : Date . * : BACK SURGERY * : * : . * : CATARACT EXTRACTION * : Right * : 05/2019 * : Dr. Talbert Forest . * : CATARACT EXTRACTION * : Left * : 2017 . * : CATARACT EXTRACTION W/ INTRAOCULAR LENS  IMPLANT * : Left * : . * : COLONOSCOPY WITH PROPOFOL * : N/A * : 03/16/2016 * : Procedure: COLONOSCOPY WITH PROPOFOL; Surgeon: Manya Silvas, MD; Location: [facility] ENDOSCOPY; Service: Endoscopy; Laterality: N/A; . *  : ESOPHAGOGASTRODUODENOSCOPY (EGD) WITH ESOPHAGEAL DILATION * : * : 1990s . * : EYE SURGERY * : Bilateral * : * : Cat Sx . * : IR ANGIO INTRA EXTRACRAN SEL COM CAROTID INNOMINATE BILAT MOD SED * : * : 10/31/2017 . * : IR ANGIO INTRA EXTRACRAN SEL COM CAROTID INNOMINATE BILAT MOD SED * : * : 01/15/2019 . * : IR ANGIO INTRA EXTRACRAN SEL COM CAROTID INNOMINATE BILAT MOD SED * : * : 09/27/2019 . * : IR ANGIO VERTEBRAL SEL VERTEBRAL BILAT MOD SED * : * : 10/31/2017 . * : IR ANGIO VERTEBRAL SEL VERTEBRAL BILAT MOD SED * : * : 01/15/2019 . * : IR ANGIO VERTEBRAL SEL VERTEBRAL BILAT MOD SED * : * : 09/27/2019 . * : IR US GUIDE VASC ACCESS RIGHT * : * : 01/15/2019 . * : IR US GUIDE VASC ACCESS RIGHT * : * : 09/27/2019 . * : LUMBAR LAMINECTOMY/DECOMPRESSION MICRODISCECTOMY * : Left * : 07/26/2012 * : Procedure: LUMBAR LAMINECTOMY/DECOMPRESSION MICRODISCECTOMY 1 LEVEL; Surgeon: Eustace Moore, MD; Location: [place] [facility]URO ORS; Service: Neurosurgery; Laterality: Left; lumbar five sacral one . * : RETINAL DETACHMENT SURGERY * : Left * : 2016 * : Dr. Posey Pronto . * : SHOULDER ARTHROSCOPY WITH OPEN ROTATOR CUFF REPAIR * : Left * : 1990s . * : TONSILLECTOMY * : * : . * : YAG LASER APPLICATION * : Left * : 08/2019 * : Dr. Talbert Forest FAMILY HISTORY Family History Problem * : Relation * : Age of Onset . * : Heart disease * : Mother * : . * : Cancer * : Sister * : . * : Dementia * : Brother * : . * : Dementia * : Sister * : . * : Diabetes * : Brother * : . * : Diabetes * : Sister * : . * : Colon cancer * : Neg Hx * : . * : Prostate cancer * : Neg Hx * : SOCIAL HISTORY Tobacco Use . * : Smoking status: * : Former * : * : Packs/day: * : 1.00 * : * : Years: * : 25.00 * : * : Pack years: * : 25.00 * : * : Types: * : Cigarettes * : * : Quit date: * : 03/03/1988 * : * : Years since quitting: * : 33.4 . * : Smokeless tobacco: * : Never Vaping Use . * : Vaping Use: * : Never used Substance Use Topics . * : Alcohol use: * : Yes * : * : Alcohol/week: * : 1.0 standard  drink * : * : Types: * : 1 Standard drinks or equivalent per week . * : Drug use: * : Never Objective OPHTHALMIC EXAM: Imaging and Procedures for 08/17/2021 OCT, Retina - OU - Both Eyes Right Eye Quality was good. Central Foveal Thickness: 370. Progression has been stable. Findings include no IRF, no SRF, abnormal foveal contour, epiretinal membrane (?mild thickening of ERM with blunting of foveal contour ). Left Eye Quality was borderline. Central Foveal Thickness: 334. Progression has been stable. Findings include abnormal foveal contour, epiretinal membrane, no SRF, no IRF, macular pucker (Mild ERM with blunted foveal profile and mild pucker; no IRF/CME). 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 CME - cystoid macular edema. PED - pigment epithelial detachment. IRF - intraretinal fluid. SRF - subretinal fluid. EZ - ellipsoid zone. ERM - epiretinal membrane. ORA - outer retinal atrophy. ORT - outer retinal tubulation. SRHM - subretinal hyper-reflective material. IRHM - intraretinal hyper-reflective material REVIEW OF SYSTEMS: Negative unless as mentioned above. PHYSICAL EXAMINATION: Patient awake alert oriented to time place space. Affect appropriate. Responsive appropriate. Neurologically no lateralizing neurological features noted grossly. Station and gait within normal limits. ASSESSMENT AND PLAN: The patient's most recent MRI MRA of the brain results were reviewed and discussed. These were compared to previous MRI MRA of the brain, and an arteriogram of June of 2021. The most recent MRA of the brain reveals significant worsening of the previously noted atherosclerotic related narrowing of the right middle cerebral artery distal M1 segment. The narrowing on the left remains  grossly stable as does the proximal basilar artery. Given the patient's recurrent left-sided weakness especially related to use, and the vertiginous symptoms discussion ensued regarding further follow-up to more accurately assessing the degree of suspected worsening stenosis involving the right middle cerebral artery and also to evaluate the left middle cerebral artery and the proximal basilar artery. It was felt that the diagnostic catheter arteriogram would be the most accurate way to assess the suspected abnormalities. The patient understands the procedure of diagnostic catheter arteriogram having had 2 or 3 of these previously. The patient had no difficulties following those procedures. The other option was to continue with conservative surveillance with neuroimaging at 6 months. Both the patient and spouse would like to proceed with a diagnostic catheter arteriogram to evaluate more accurately the suspected changes seen on the most recent MRA of the brain of his left-sided symptoms. This will be arranged as soon as possible. In the meantime, the patient advised to continue with his dual antiplatelets in addition to his other medications, and also to maintain adequate hydration. They were asked to call should they have any concerns or questions. Electronically Signed   By: Luanne Bras M.D.   On: 10/20/2021 07:54     Labs:  CBC: Recent Labs    12/23/20 0946  WBC 8.0  HGB 14.8  HCT 43.6  PLT 174     COAGS: Recent Labs    10/29/21 0903  INR 1.0     BMP: Recent Labs    12/23/20 0946 10/29/21 0903  NA 139 139  K 5.0 4.4  CL 105 106  CO2 21 22  GLUCOSE 128* 126*  BUN 13 18  CALCIUM 9.4 9.0  CREATININE 1.11 1.13  GFRNONAA  --  >60     LIVER FUNCTION TESTS: Recent Labs    12/23/20 0946  BILITOT 0.6  AST 24  ALT 23  ALKPHOS 83  PROT 6.5  ALBUMIN 4.5     TUMOR MARKERS: No results for input(s): "AFPTM", "CEA", "CA199", "CHROMGRNA" in the last 8760  hours.  Assessment and Plan: Hx of prior CVA with residual left sided deficits. Review of MRI suggests progression of  stenosis. Although he is not having any new persistent symptoms, diagnostic angiogram was dicussed with the patient by Dr. Estanislado Pandy and Garrett Green elects to proceed.  Has continued his aspirin and Plavix as prescribed.  NPO today.   Risks and benefits of angiogram  were discussed with the patient including, but not limited to bleeding, infection, vascular injury or contrast induced renal failure.  This interventional procedure involves the use of X-rays and because of the nature of the planned procedure, it is possible that we will have prolonged use of X-ray fluoroscopy.  Potential radiation risks to you include (but are not limited to) the following: - A slightly elevated risk for cancer  several years later in life. This risk is typically less than 0.5% percent. This risk is low in comparison to the normal incidence of human cancer, which is 33% for women and 50% for men according to the [place]. - Radiation induced injury can include skin redness, resembling a rash, tissue breakdown / ulcers and hair loss (which can be temporary or permanent).   The likelihood of either of these occurring depends on the difficulty of the procedure and whether you are sensitive to radiation due to previous procedures, disease, or genetic conditions.   IF your procedure requires a prolonged use of radiation, you will be notified and given written instructions for further action.  It is your responsibility to monitor the irradiated area for the 2 weeks following the procedure and to notify your physician if you are concerned that you have suffered a radiation induced injury.    All of the patient's questions were answered, patient is agreeable to proceed.  Consent signed and in chart.   Thank you for this interesting consult.  I greatly enjoyed meeting Garrett Green  and look forward to participating in their care.  A copy of this report was sent to the requesting provider on this date.  Electronically Signed: Docia Barrier, PA 10/29/2021, 10:33 AM   I spent a total of 20 minutes in face to face in clinical consultation, greater than 50% of which was counseling/coordinating care for intracranial arterial stenosis

## 2021-10-29 NOTE — Progress Notes (Signed)
Pt ambulated to [facility]

## 2021-10-29 NOTE — Sedation Documentation (Signed)
Dr. Estanislado Pandy requested that patients oxygen via Nasal Canula and end tital CO2 be removed. IR tech removed nasal canula. This RN informed Dr. Estanislado Pandy that the patient needs to be on oxygen and full monitoring. Dr. Estanislado Pandy disagreed and stated that he needs it off for imaging. This RN informed Dr. Estanislado Pandy that it needs to be on for monitoring and patient safety. This RN replaced the Nasal Canula, still infusing at 2LPM

## 2021-11-15 ENCOUNTER — Other Ambulatory Visit: Payer: Self-pay | Admitting: Family Medicine

## 2021-11-15 DIAGNOSIS — I10 Essential (primary) hypertension: Secondary | ICD-10-CM

## 2021-11-17 ENCOUNTER — Other Ambulatory Visit: Payer: Self-pay | Admitting: Family Medicine

## 2021-11-17 DIAGNOSIS — M47816 Spondylosis without myelopathy or radiculopathy, lumbar region: Secondary | ICD-10-CM

## 2021-11-18 ENCOUNTER — Other Ambulatory Visit (INDEPENDENT_AMBULATORY_CARE_PROVIDER_SITE_OTHER): Payer: Self-pay | Admitting: Ophthalmology

## 2021-11-18 MED ORDER — HYDROCODONE-ACETAMINOPHEN 5-325 MG PO TABS: 1.0000 | ORAL_TABLET | Freq: Four times a day (QID) | ORAL | 0 refills | Status: DC | PRN

## 2021-12-03 ENCOUNTER — Other Ambulatory Visit: Payer: Self-pay | Admitting: Family Medicine

## 2021-12-17 ENCOUNTER — Telehealth: Payer: Self-pay | Admitting: Family Medicine

## 2021-12-17 DIAGNOSIS — I10 Essential (primary) hypertension: Secondary | ICD-10-CM

## 2021-12-17 MED ORDER — AMLODIPINE BESYLATE 2.5 MG PO TABS: 2.5000 mg | ORAL_TABLET | Freq: Every day | ORAL | 3 refills | Status: DC

## 2021-12-17 NOTE — Telephone Encounter (Signed)
Resent rx

## 2021-12-17 NOTE — Telephone Encounter (Signed)
[facility] faxed refill request for the following medications:  amLODipine (NORVASC) 2.5 MG tablet   Please advise.

## 2021-12-22 ENCOUNTER — Encounter: Payer: Medicare Other | Admitting: Family Medicine

## 2021-12-24 ENCOUNTER — Encounter: Payer: Self-pay | Admitting: Family Medicine

## 2021-12-24 ENCOUNTER — Ambulatory Visit (INDEPENDENT_AMBULATORY_CARE_PROVIDER_SITE_OTHER): Payer: Medicare Other | Admitting: Family Medicine

## 2021-12-24 VITALS — BP 137/71 | HR 63 | Resp 16 | Ht 68.0 in | Wt 178.0 lb

## 2021-12-24 DIAGNOSIS — I10 Essential (primary) hypertension: Secondary | ICD-10-CM

## 2021-12-24 DIAGNOSIS — M25551 Pain in right hip: Secondary | ICD-10-CM

## 2021-12-24 DIAGNOSIS — R200 Anesthesia of skin: Secondary | ICD-10-CM

## 2021-12-24 DIAGNOSIS — R7303 Prediabetes: Secondary | ICD-10-CM | POA: Diagnosis not present

## 2021-12-24 DIAGNOSIS — E785 Hyperlipidemia, unspecified: Secondary | ICD-10-CM

## 2021-12-24 DIAGNOSIS — Z0000 Encounter for general adult medical examination without abnormal findings: Secondary | ICD-10-CM | POA: Diagnosis not present

## 2021-12-24 DIAGNOSIS — I6990 Unspecified sequelae of unspecified cerebrovascular disease: Secondary | ICD-10-CM

## 2021-12-24 DIAGNOSIS — M25552 Pain in left hip: Secondary | ICD-10-CM

## 2021-12-24 DIAGNOSIS — R202 Paresthesia of skin: Secondary | ICD-10-CM

## 2021-12-24 DIAGNOSIS — Z125 Encounter for screening for malignant neoplasm of prostate: Secondary | ICD-10-CM

## 2021-12-24 NOTE — Patient Instructions (Signed)
Please review the attached list of medications and notify my office if there are any errors.   I recommend that you get a flu vaccine this year. Please call our office at [phone] at your earliest convenience to schedule a flu shot.

## 2021-12-24 NOTE — Progress Notes (Unsigned)
Annual Wellness Visit     Patient: Garrett Green, Male    DOB: July 13, 1945, 76 y.o.   MRN: [id number] Visit Date: 12/24/2021  Today's Provider: Lelon Huh, MD   Chief Complaint  Patient presents with   Annual Exam   Subjective    Garrett Green is a 76 y.o. male who presents today for his Annual Wellness Visit.    Medications: Outpatient Medications Prior to Visit  Medication Sig   amLODipine (NORVASC) 2.5 MG tablet Take 1 tablet (2.5 mg total) by mouth daily.   aspirin 325 MG tablet Take 325 mg by mouth daily.   atorvastatin (LIPITOR) 40 MG tablet TAKE 1 TABLET BY MOUTH EVERY DAY   brimonidine (ALPHAGAN P) 0.1 % SOLN Place 1 drop into both eyes 2 (two) times daily.   Bromfenac Sodium (PROLENSA) 0.07 % SOLN Place 1 drop into the left eye daily.   clopidogrel (PLAVIX) 75 MG tablet TAKE 1 TABLET BY MOUTH EVERY DAY   dorzolamide (TRUSOPT) 2 % ophthalmic solution INSTILL 1 DROP INTO LEFT EYE TWICE A DAY   fluticasone (FLONASE) 50 MCG/ACT nasal spray Place 2 sprays into both nostrils daily as needed for allergies.   HYDROcodone-acetaminophen (NORCO/VICODIN) 5-325 MG tablet Take 1 tablet by mouth every 6 (six) hours as needed.   loratadine (CLARITIN) 10 MG tablet Take 10 mg by mouth daily as needed for allergies.   LORazepam (ATIVAN) 1 MG tablet Take 1 tablet (1 mg total) by mouth 3 (three) times daily as needed. (Patient taking differently: Take 1 mg by mouth 3 (three) times daily as needed (flying).)   prednisoLONE acetate (PRED FORTE) 1 % ophthalmic suspension INSTILL 1 DROP INTO LEFT EYE EVERY DAY   Pseudoeph-Doxylamine-DM-APAP (NYQUIL PO) Take 1 Dose by mouth at bedtime as needed (congestion).   ramipril (ALTACE) 10 MG capsule TAKE 1 CAPSULE BY MOUTH  DAILY   zolpidem (AMBIEN) 10 MG tablet Take 0.5-1 tablets (5-10 mg total) by mouth at bedtime as needed for sleep.   No facility-administered medications prior to visit.    Allergies  Allergen Reactions   Sulfa  Antibiotics Other (See Comments)    Lethargic and hypotension   Clavulanic Acid Diarrhea    Patient Care Team: Birdie Sons, MD as PCP - General (Family Medicine) Oh, Lupita Dawn, MD (Inactive) as Consulting Physician (Gastroenterology) Jalene Mullet, MD as Consulting Physician (Ophthalmology) Garvin Fila, MD as Consulting Physician (Neurology) Frann Rider, NP as Nurse Practitioner (Neurology) Luanne Bras, MD as Consulting Physician (Interventional Radiology) Brendolyn Patty, MD (Dermatology)    {Labs  Heme  Chem  Endocrine  Serology  Results Review (optional):23779}    Objective     Most recent functional status assessment:    06/23/2021    8:15 AM  In your present state of health, do you have any difficulty performing the following activities:  Hearing? 0  Vision? 0  Difficulty concentrating or making decisions? 0  Walking or climbing stairs? 0  Dressing or bathing? 0  Doing errands, shopping? 0   Most recent fall risk assessment:    06/23/2021    8:15 AM  Fall Risk   Falls in the past year? 0  Number falls in past yr: 0  Injury with Fall? 0  Risk for fall due to : No Fall Risks  Follow up Falls evaluation completed    Most recent depression screenings:    06/23/2021    8:14 AM 12/23/2020    8:57 AM  PHQ 2/9 Scores  PHQ - 2 Score 0 0  PHQ- 9 Score 2 3   Most recent cognitive screening:     No data to display         Most recent Audit-C alcohol use screening    06/23/2021    8:15 AM  Alcohol Use Disorder Test (AUDIT)  1. How often do you have a drink containing alcohol? 2  2. How many drinks containing alcohol do you have on a typical day when you are drinking? 0  3. How often do you have six or more drinks on one occasion? 0  AUDIT-C Score 2   A score of 3 or more in women, and 4 or more in men indicates increased risk for alcohol abuse, EXCEPT if all of the points are from question 1   No results found for any visits on  12/24/21.  Assessment & Plan     Annual wellness visit done today including the all of the following: Reviewed patient's Family Medical History Reviewed and updated list of patient's medical providers Assessment of cognitive impairment was done Assessed patient's functional ability Established a written schedule for health screening services Health Risk Assessent Completed and Reviewed  Exercise Activities and Dietary recommendations  Goals   None     Immunization History  Administered Date(s) Administered   Fluad Quad(high Dose 65+) 12/11/2018, 04/08/2020   Influenza, High Dose Seasonal PF 11/30/2016, 02/12/2018   Pneumococcal Conjugate-13 10/23/2013   Pneumococcal Polysaccharide-23 10/27/2014   Td 11/26/2015   Tdap 06/20/2005   Zoster, Live 09/02/2009    Health Maintenance  Topic Date Due   COVID-19 Vaccine (1) Never done   Zoster Vaccines- Shingrix (1 of 2) Never done   INFLUENZA VACCINE  11/09/2021   TETANUS/TDAP  11/25/2025   COLONOSCOPY (Pts 45-18yr Insurance coverage will need to be confirmed)  03/16/2026   Pneumonia Vaccine 76 Years old  Completed   Hepatitis C Screening  Completed   HPV VACCINES  Aged Out     Discussed health benefits of physical activity, and encouraged him to engage in regular exercise appropriate for his age and condition.    ***  No follow-ups on file.     {provider attestation***:1}   DLelon Huh MD  [phone](phone) [phone](fax)  [place]

## 2021-12-24 NOTE — Progress Notes (Unsigned)
I,Tiffany J Bragg,acting as a scribe for Lelon Huh, MD.,have documented all relevant documentation on the behalf of Lelon Huh, MD,as directed by  Lelon Huh, MD while in the presence of Lelon Huh, MD.  Complete physical exam   Patient: Garrett Green   DOB: [birth year]   76 y.o. Male  MRN: [id number] Visit Date: 12/24/2021  Today's healthcare provider: Lelon Huh, MD   Chief Complaint  Patient presents with   Annual Exam   Subjective    Garrett Green is a 76 y.o. male who presents today for a complete physical exam.  He reports consuming a general diet. Home exercise routine includes house and yardwork. He generally feels well. He reports sleeping poorly. He does have additional problems to discuss today. States his hips hurt and feet go numb when he sits for long periods.    Past Medical History:  Diagnosis Date   COVID-19 07/2019   History of blood transfusion 2014   "related to back OR"   History of chicken pox    Hypertensive retinopathy    OU   Retinal detachment    OS   Stroke ([place])    TIA (transient ischemic attack) 10/17/2017; 10/19/2017   Past Surgical History:  Procedure Laterality Date   BACK SURGERY     CATARACT EXTRACTION Right 05/2019   Dr. Talbert Forest   CATARACT EXTRACTION Left 2017   CATARACT EXTRACTION W/ INTRAOCULAR LENS IMPLANT Left    COLONOSCOPY WITH PROPOFOL N/A 03/16/2016   Procedure: COLONOSCOPY WITH PROPOFOL;  Surgeon: Manya Silvas, MD;  Location: [facility];  Service: Endoscopy;  Laterality: N/A;   ESOPHAGOGASTRODUODENOSCOPY (EGD) WITH ESOPHAGEAL DILATION  1990s   EYE SURGERY Bilateral    Cat Sx   IR ANGIO INTRA EXTRACRAN SEL COM CAROTID INNOMINATE BILAT MOD SED  10/31/2017   IR ANGIO INTRA EXTRACRAN SEL COM CAROTID INNOMINATE BILAT MOD SED  01/15/2019   IR ANGIO INTRA EXTRACRAN SEL COM CAROTID INNOMINATE BILAT MOD SED  09/27/2019   IR ANGIO INTRA EXTRACRAN SEL COM CAROTID INNOMINATE BILAT MOD SED  10/29/2021   IR ANGIO  VERTEBRAL SEL VERTEBRAL BILAT MOD SED  10/31/2017   IR ANGIO VERTEBRAL SEL VERTEBRAL BILAT MOD SED  01/15/2019   IR ANGIO VERTEBRAL SEL VERTEBRAL BILAT MOD SED  09/27/2019   IR ANGIO VERTEBRAL SEL VERTEBRAL BILAT MOD SED  10/29/2021   IR RADIOLOGIST EVAL & MGMT  10/20/2021   IR US GUIDE VASC ACCESS RIGHT  01/15/2019   IR US GUIDE VASC ACCESS RIGHT  09/27/2019   IR US GUIDE VASC ACCESS RIGHT  10/29/2021   LUMBAR LAMINECTOMY/DECOMPRESSION MICRODISCECTOMY Left 07/26/2012   Procedure: LUMBAR LAMINECTOMY/DECOMPRESSION MICRODISCECTOMY 1 LEVEL;  Surgeon: Eustace Moore, MD;  Location: [facility];  Service: Neurosurgery;  Laterality: Left;  lumbar five sacral one   RETINAL DETACHMENT SURGERY Left 2016   Dr. Posey Pronto   SHOULDER ARTHROSCOPY WITH OPEN ROTATOR CUFF REPAIR Left 1990s   TONSILLECTOMY     YAG LASER APPLICATION Left 93/5701   Dr. Talbert Forest   Social History   Socioeconomic History   Marital status: Married    Spouse name: Not on file   Number of children: Not on file   Years of education: Not on file   Highest education level: Not on file  Occupational History    Employer: RETIRED  Tobacco Use   Smoking status: Former    Packs/day: 1.00    Years: 25.00    Total pack years: 25.00  Types: Cigarettes    Quit date: 03/03/1988    Years since quitting: 33.8   Smokeless tobacco: Never  Vaping Use   Vaping Use: Never used  Substance and Sexual Activity   Alcohol use: Yes    Alcohol/week: 1.0 standard drink of alcohol    Types: 1 Standard drinks or equivalent per week   Drug use: Never   Sexual activity: Yes  Other Topics Concern   Not on file  Social History Narrative   Not on file   Social Determinants of Health   Financial Resource Strain: Not on file  Food Insecurity: Not on file  Transportation Needs: Not on file  Physical Activity: Not on file  Stress: Not on file  Social Connections: Not on file  Intimate Partner Violence: Not on file   Family Status  Relation Name Status    Mother  Deceased   Father  Deceased   Sister  Deceased   Brother  Deceased   Sister  Deceased   Sister  Deceased   Brother  Alive   Sister  Alive   Neg Hx  (Not Specified)   Family History  Problem Relation Age of Onset   Heart disease Mother    Cancer Sister    Dementia Brother    Dementia Sister    Diabetes Brother    Diabetes Sister    Colon cancer Neg Hx    Prostate cancer Neg Hx    Allergies  Allergen Reactions   Sulfa Antibiotics Other (See Comments)    Lethargic and hypotension   Clavulanic Acid Diarrhea    Patient Care Team: Birdie Sons, MD as PCP - General (Family Medicine) Oh, Lupita Dawn, MD (Inactive) as Consulting Physician (Gastroenterology) Jalene Mullet, MD as Consulting Physician (Ophthalmology) Garvin Fila, MD as Consulting Physician (Neurology) Frann Rider, NP as Nurse Practitioner (Neurology) Luanne Bras, MD as Consulting Physician (Interventional Radiology) Brendolyn Patty, MD (Dermatology)   Medications: Outpatient Medications Prior to Visit  Medication Sig   amLODipine (NORVASC) 2.5 MG tablet Take 1 tablet (2.5 mg total) by mouth daily.   aspirin 325 MG tablet Take 325 mg by mouth daily.   atorvastatin (LIPITOR) 40 MG tablet TAKE 1 TABLET BY MOUTH EVERY DAY   brimonidine (ALPHAGAN P) 0.1 % SOLN Place 1 drop into both eyes 2 (two) times daily.   Bromfenac Sodium (PROLENSA) 0.07 % SOLN Place 1 drop into the left eye daily.   clopidogrel (PLAVIX) 75 MG tablet TAKE 1 TABLET BY MOUTH EVERY DAY   dorzolamide (TRUSOPT) 2 % ophthalmic solution INSTILL 1 DROP INTO LEFT EYE TWICE A DAY   fluticasone (FLONASE) 50 MCG/ACT nasal spray Place 2 sprays into both nostrils daily as needed for allergies.   HYDROcodone-acetaminophen (NORCO/VICODIN) 5-325 MG tablet Take 1 tablet by mouth every 6 (six) hours as needed.   loratadine (CLARITIN) 10 MG tablet Take 10 mg by mouth daily as needed for allergies.   LORazepam (ATIVAN) 1 MG tablet Take 1  tablet (1 mg total) by mouth 3 (three) times daily as needed. (Patient taking differently: Take 1 mg by mouth 3 (three) times daily as needed (flying).)   prednisoLONE acetate (PRED FORTE) 1 % ophthalmic suspension INSTILL 1 DROP INTO LEFT EYE EVERY DAY   Pseudoeph-Doxylamine-DM-APAP (NYQUIL PO) Take 1 Dose by mouth at bedtime as needed (congestion).   ramipril (ALTACE) 10 MG capsule TAKE 1 CAPSULE BY MOUTH  DAILY   zolpidem (AMBIEN) 10 MG tablet Take 0.5-1 tablets (5-10 mg total)  by mouth at bedtime as needed for sleep.   No facility-administered medications prior to visit.    Review of Systems  Constitutional:  Negative for chills, diaphoresis and fever.  HENT:  Negative for congestion, ear discharge, ear pain, hearing loss, nosebleeds, sore throat and tinnitus.   Eyes:  Negative for photophobia, pain, discharge and redness.  Respiratory:  Negative for cough, shortness of breath, wheezing and stridor.   Cardiovascular:  Negative for chest pain, palpitations and leg swelling.  Gastrointestinal:  Negative for abdominal pain, blood in stool, constipation, diarrhea, nausea and vomiting.  Endocrine: Negative for polydipsia.  Genitourinary:  Negative for dysuria, flank pain, frequency, hematuria and urgency.  Musculoskeletal:  Negative for back pain, myalgias and neck pain.  Skin:  Negative for rash.  Allergic/Immunologic: Negative for environmental allergies.  Neurological:  Negative for dizziness, tremors, seizures, weakness and headaches.  Hematological:  Does not bruise/bleed easily.  Psychiatric/Behavioral:  Negative for hallucinations and suicidal ideas. The patient is not nervous/anxious.     {Labs  Heme  Chem  Endocrine  Serology  Results Review (optional):23779}  Objective     BP 137/71 (BP Location: Right Arm, Patient Position: Sitting, Cuff Size: Normal)   Pulse 63   Resp 16   Ht '5\' 8"'$  (1.727 m)   Wt 178 lb (80.7 kg)   SpO2 100%   BMI 27.06 kg/m  {Show previous vital  signs (optional):23777}   Physical Exam    General Appearance:    Well developed, well nourished male. Alert, cooperative, in no acute distress, appears stated age  Head:    Normocephalic, without obvious abnormality, atraumatic  Eyes:    PERRL, conjunctiva/corneas clear, EOM's intact, fundi    benign, both eyes       Ears:    Normal TM's and external ear canals, both ears  Nose:   Nares normal, septum midline, mucosa normal, no drainage   or sinus tenderness  Throat:   Lips, mucosa, and tongue normal; teeth and gums normal  Neck:   Supple, symmetrical, trachea midline, no adenopathy;       thyroid:  No enlargement/tenderness/nodules; no carotid   bruit or JVD  Back:     Symmetric, no curvature, ROM normal, no CVA tenderness  Lungs:     Clear to auscultation bilaterally, respirations unlabored  Chest wall:    No tenderness or deformity  Heart:    Normal heart rate. Normal rhythm. No murmurs, rubs, or gallops.  S1 and S2 normal  Abdomen:     Soft, non-tender, bowel sounds active all four quadrants,    no masses, no organomegaly  Genitalia:    deferred  Rectal:    deferred  Extremities:   All extremities are intact. No cyanosis or edema  Pulses:   2+ and symmetric all extremities  Skin:   Skin color, texture, turgor normal, no rashes or lesions  Lymph nodes:   Cervical, supraclavicular, and axillary nodes normal  Neurologic:   CNII-XII intact. Normal strength, sensation and reflexes      throughout    Assessment & Plan    1. Annual physical exam   2. Numbness and tingling of both legs  - Vitamin B12 - VITAMIN D 25 Hydroxy (Vit-D Deficiency, Fractures)  Is not causing pain or keeping awake at night.   3. Bilateral hip pain   4. 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 Continue aggressive risk factor  modification.   6. Hyperlipidemia, unspecified hyperlipidemia type He is tolerating  atorvastatin well with no adverse effects.   - Lipid panel - TSH  7. Prostate cancer screening  - PSA Total (Reflex To Free) ([facility] only)  8. Essential (primary) hypertension Well controlled. Continue current medications.   - CBC - Comprehensive metabolic panel - EKG 89-HTDS  He declined flu vaccine today.     The entirety of the information documented in the History of Present Illness, Review of Systems and Physical Exam were personally obtained by me. Portions of this information were initially documented by the CMA and reviewed by me for thoroughness and accuracy.     Lelon Huh, MD  [facility] [phone] (phone) [phone] (fax)  [place]

## 2021-12-25 LAB — COMPREHENSIVE METABOLIC PANEL
ALT: 15 IU/L (ref 0–44)
AST: 18 IU/L (ref 0–40)
Albumin/Globulin Ratio: 2 (ref 1.2–2.2)
Albumin: 4.5 g/dL (ref 3.8–4.8)
Alkaline Phosphatase: 82 IU/L (ref 44–121)
BUN/Creatinine Ratio: 14 (ref 10–24)
BUN: 15 mg/dL (ref 8–27)
Bilirubin Total: 0.8 mg/dL (ref 0.0–1.2)
CO2: 22 mmol/L (ref 20–29)
Calcium: 9.7 mg/dL (ref 8.6–10.2)
Chloride: 107 mmol/L — ABNORMAL HIGH (ref 96–106)
Creatinine, Ser: 1.07 mg/dL (ref 0.76–1.27)
Globulin, Total: 2.2 g/dL (ref 1.5–4.5)
Glucose: 120 mg/dL — ABNORMAL HIGH (ref 70–99)
Potassium: 5.2 mmol/L (ref 3.5–5.2)
Sodium: 141 mmol/L (ref 134–144)
Total Protein: 6.7 g/dL (ref 6.0–8.5)
eGFR: 73 mL/min/{1.73_m2} (ref >59)

## 2021-12-25 LAB — LIPID PANEL
Chol/HDL Ratio: 2.2 ratio (ref 0.0–5.0)
Cholesterol, Total: 119 mg/dL (ref 100–199)
HDL: 54 mg/dL (ref >39)
LDL Chol Calc (NIH): 50 mg/dL (ref 0–99)
Triglycerides: 75 mg/dL (ref 0–149)
VLDL Cholesterol Cal: 15 mg/dL (ref 5–40)

## 2021-12-25 LAB — CBC
Hematocrit: 41.2 % (ref 37.5–51.0)
Hemoglobin: 14.1 g/dL (ref 13.0–17.7)
MCH: 31.1 pg (ref 26.6–33.0)
MCHC: 34.2 g/dL (ref 31.5–35.7)
MCV: 91 fL (ref 79–97)
Platelets: 185 10*3/uL (ref 150–450)
RBC: 4.54 x10E6/uL (ref 4.14–5.80)
RDW: 11.8 % (ref 11.6–15.4)
WBC: 8.4 10*3/uL (ref 3.4–10.8)

## 2021-12-25 LAB — TSH: TSH: 1.44 u[IU]/mL (ref 0.450–4.500)

## 2021-12-25 LAB — VITAMIN D 25 HYDROXY (VIT D DEFICIENCY, FRACTURES): Vit D, 25-Hydroxy: 33.5 ng/mL (ref 30.0–100.0)

## 2021-12-25 LAB — PSA TOTAL (REFLEX TO FREE): Prostate Specific Ag, Serum: 0.7 ng/mL (ref 0.0–4.0)

## 2021-12-25 LAB — HEMOGLOBIN A1C
Est. average glucose Bld gHb Est-mCnc: 123 mg/dL
Hgb A1c MFr Bld: 5.9 % — ABNORMAL HIGH (ref 4.8–5.6)

## 2021-12-25 LAB — VITAMIN B12: Vitamin B-12: 283 pg/mL (ref 232–1245)

## 2022-01-13 NOTE — Progress Notes (Addendum)
[facility]  01/17/2022     CHIEF COMPLAINT Patient presents for Retina Follow Up  HISTORY OF PRESENT ILLNESS: Garrett Green is a 76 y.o. male who presents to the clinic today for:   HPI     Retina Follow Up   Patient presents with  Other.  In both eyes.  Severity is moderate.  Duration of 5 months.  Since onset it is gradually worsening.  I, the attending physician,  performed the HPI with the patient and updated documentation appropriately.        Comments   Pt here for 5 mo ret f/u for CME OS. Pt states he feels VA is worsening in OD, unsure about OS. Pt states its been approx 2 mo since he noticed while driving hes unable to see distance as well in OD, especially at night. Some pressure sensation in OD reported yesterday, this has happened recently, comes and goes.Seeing more floaters in OD along w/ intermittent FOL at nighttime. Pt reports taking PF and Prolensa QD OS, Alphagan P BID OU, and dorzolamide QD OD (should be BID but pt states it burns badly when he puts it in)      Last edited by Bernarda Caffey, MD on 01/17/2022  9:14 AM.    Pt feels like right eye distance vision is worse today   Referring physician: Birdie Sons, MD [address] [place],  [place] [address]  HISTORICAL INFORMATION:   Selected notes from the MEDICAL RECORD NUMBER Referred by Dr. Rick Duff   CURRENT MEDICATIONS: Current Outpatient Medications (Ophthalmic Drugs)  Medication Sig   brimonidine (ALPHAGAN P) 0.1 % SOLN Place 1 drop into both eyes 2 (two) times daily.   Bromfenac Sodium (PROLENSA) 0.07 % SOLN Place 1 drop into the left eye daily.   dorzolamide (TRUSOPT) 2 % ophthalmic solution INSTILL 1 DROP INTO LEFT EYE TWICE A DAY   prednisoLONE acetate (PRED FORTE) 1 % ophthalmic suspension INSTILL 1 DROP INTO LEFT EYE EVERY DAY   No current facility-administered medications for this visit. (Ophthalmic Drugs)   Current Outpatient  Medications (Other)  Medication Sig   amLODipine (NORVASC) 2.5 MG tablet Take 1 tablet (2.5 mg total) by mouth daily.   aspirin 325 MG tablet Take 325 mg by mouth daily.   atorvastatin (LIPITOR) 40 MG tablet Take 1 tablet (40 mg total) by mouth daily.   clopidogrel (PLAVIX) 75 MG tablet Take 1 tablet (75 mg total) by mouth daily.   fluticasone (FLONASE) 50 MCG/ACT nasal spray Place 2 sprays into both nostrils daily as needed for allergies.   HYDROcodone-acetaminophen (NORCO/VICODIN) 5-325 MG tablet Take 1 tablet by mouth every 6 (six) hours as needed.   loratadine (CLARITIN) 10 MG tablet Take 10 mg by mouth daily as needed for allergies.   LORazepam (ATIVAN) 1 MG tablet Take 1 tablet (1 mg total) by mouth 3 (three) times daily as needed. (Patient taking differently: Take 1 mg by mouth 3 (three) times daily as needed (flying).)   Pseudoeph-Doxylamine-DM-APAP (NYQUIL PO) Take 1 Dose by mouth at bedtime as needed (congestion).   ramipril (ALTACE) 10 MG capsule TAKE 1 CAPSULE BY MOUTH  DAILY   zolpidem (AMBIEN) 10 MG tablet Take 0.5-1 tablets (5-10 mg total) by mouth at bedtime as needed for sleep.   No current facility-administered medications for this visit. (Other)   REVIEW OF SYSTEMS: ROS   Positive for: Gastrointestinal, Neurological, Musculoskeletal, Cardiovascular, Eyes Negative for: Constitutional, Skin, Genitourinary,  HENT, Endocrine, Respiratory, Psychiatric, Allergic/Imm, Heme/Lymph Last edited by Kingsley Spittle, COT on 01/17/2022  8:21 AM.     ALLERGIES Allergies  Allergen Reactions   Sulfa Antibiotics Other (See Comments)    Lethargic and hypotension   Clavulanic Acid Diarrhea   PAST MEDICAL HISTORY Past Medical History:  Diagnosis Date   COVID-19 07/2019   History of blood transfusion 2014   "related to back OR"   History of chicken pox    Hypertensive retinopathy    OU   Retinal detachment    OS   Stroke ([place])    TIA (transient ischemic attack) 10/17/2017;  10/19/2017   Past Surgical History:  Procedure Laterality Date   BACK SURGERY     CATARACT EXTRACTION Right 05/2019   Dr. Talbert Forest   CATARACT EXTRACTION Left 2017   CATARACT EXTRACTION W/ INTRAOCULAR LENS IMPLANT Left    COLONOSCOPY WITH PROPOFOL N/A 03/16/2016   Procedure: COLONOSCOPY WITH PROPOFOL;  Surgeon: Manya Silvas, MD;  Location[facility] ENDOSCOPY;  Service: Endoscopy;  Laterality: N/A;   ESOPHAGOGASTRODUODENOSCOPY (EGD) WITH ESOPHAGEAL DILATION  1990s   EYE SURGERY Bilateral    Cat Sx   IR ANGIO INTRA EXTRACRAN SEL COM CAROTID INNOMINATE BILAT MOD SED  10/31/2017   IR ANGIO INTRA EXTRACRAN SEL COM CAROTID INNOMINATE BILAT MOD SED  01/15/2019   IR ANGIO INTRA EXTRACRAN SEL COM CAROTID INNOMINATE BILAT MOD SED  09/27/2019   IR ANGIO INTRA EXTRACRAN SEL COM CAROTID INNOMINATE BILAT MOD SED  10/29/2021   IR ANGIO VERTEBRAL SEL VERTEBRAL BILAT MOD SED  10/31/2017   IR ANGIO VERTEBRAL SEL VERTEBRAL BILAT MOD SED  01/15/2019   IR ANGIO VERTEBRAL SEL VERTEBRAL BILAT MOD SED  09/27/2019   IR ANGIO VERTEBRAL SEL VERTEBRAL BILAT MOD SED  10/29/2021   IR RADIOLOGIST EVAL & MGMT  10/20/2021   IR US GUIDE VASC ACCESS RIGHT  01/15/2019   IR US GUIDE VASC ACCESS RIGHT  09/27/2019   IR US GUIDE VASC ACCESS RIGHT  10/29/2021   LUMBAR LAMINECTOMY/DECOMPRESSION MICRODISCECTOMY Left 07/26/2012   Procedure: LUMBAR LAMINECTOMY/DECOMPRESSION MICRODISCECTOMY 1 LEVEL;  Surgeon: Eustace Moore, MD;  Location: [facility] NEURO ORS;  Service: Neurosurgery;  Laterality: Left;  lumbar five sacral one   RETINAL DETACHMENT SURGERY Left 2016   Dr. Posey Pronto   SHOULDER ARTHROSCOPY WITH OPEN ROTATOR CUFF REPAIR Left 1990s   TONSILLECTOMY     YAG LASER APPLICATION Left 26/4158   Dr. Talbert Forest   FAMILY HISTORY Family History  Problem Relation Age of Onset   Heart disease Mother    Cancer Sister    Dementia Brother    Dementia Sister    Diabetes Brother    Diabetes Sister    Colon cancer Neg Hx    Prostate cancer Neg Hx    SOCIAL  HISTORY Social History   Tobacco Use   Smoking status: Former    Packs/day: 1.00    Years: 25.00    Total pack years: 25.00    Types: Cigarettes    Quit date: 03/03/1988    Years since quitting: 33.8   Smokeless tobacco: Never  Vaping Use   Vaping Use: Never used  Substance Use Topics   Alcohol use: Yes    Alcohol/week: 1.0 standard drink of alcohol    Types: 1 Standard drinks or equivalent per week   Drug use: Never       OPHTHALMIC EXAM:  Base Eye Exam     Visual Acuity (Snellen - Linear)  Right Left   Dist [place] 20/30 +2 20/60 -2   Dist ph [place] NI 20/50 -2         Tonometry (Tonopen, 8:41 AM)       Right Left   Pressure 13 11         Pupils       Dark Light Shape React APD   Right 2 1 Round Brisk None   Left 2 1 Round Brisk None         Visual Fields (Counting fingers)       Left Right    Full Full         Extraocular Movement       Right Left    Full, Ortho Full, Ortho         Neuro/Psych     Oriented x3: Yes   Mood/Affect: Normal         Dilation     Both eyes: 1.0% Mydriacyl, 2.5% Phenylephrine @ 8:42 AM           Slit Lamp and Fundus Exam     Slit Lamp Exam       Right Left   Lids/Lashes Dermatochalasis - upper lid Dermatochalasis - upper lid, mild MGD   Conjunctiva/Sclera Mild nasal Pinguecula, Trace Injection Trace Injection   Cornea Mild arcus, well healed temporal cataract wounds, 3+Punctate epithelial erosions, mild tear film debris, decreased TBUT Mild arcus, well healed temporal cataract wounds, 2+ inferior Punctate epithelial erosions, mild tear film debris   Anterior Chamber deep and clear deep and clear   Iris Round and moderately dilated mild iridodonesis, Round and poorly dilated to 3.0   Lens PC IOL in good position PC IOL with fine pigment depositon, open PC   Anterior Vitreous Vitreous syneresis, Posterior vitreous detachment, vitreous condensations, Weiss ring Vitreous syneresis         Fundus  Exam       Right Left   Disc Pink and Sharp mild pallor, sharp rim, +PPA, +cupping, very thins superior rim   C/D Ratio 0.6 0.85   Macula Flat, Blunted foveal reflex, ERM, mild RPE mottling and clumping, No heme or edema Blunted foveal reflex, central CME - stably improved, +ERM, RPE mottling and clumping, no heme   Vessels mild tortuosity, attenuated Attenuated, mild tortuosity   Periphery Attached, No heme limited view due to poor dilation, attached, no heme, inferior paving stone degeneration / CR atrophy           Refraction     Manifest Refraction       Sphere Cylinder Axis Dist VA   Right -0.50 +0.25 040 20/25   Left -1.50 +0.50 165 20/50-1           IMAGING AND PROCEDURES  Imaging and Procedures for 01/17/2022  OCT, Retina - OU - Both Eyes       Right Eye Quality was good. Central Foveal Thickness: 385. Progression has been stable. Findings include no IRF, no SRF, abnormal foveal contour, epiretinal membrane (ERM with mild interval flattening of foveal contour ).   Left Eye Quality was good. Central Foveal Thickness: 320. Progression has been stable. Findings include no IRF, no SRF, abnormal foveal contour, epiretinal membrane, macular pucker (Mild ERM with blunted foveal profile and mild pucker; no IRF/CME).   Notes *Images captured and stored on drive  Diagnosis / Impression:  OD:  ERM with mild interval flattening of foveal contour  OS: Mild ERM with blunted foveal profile and mild  pucker; no IRF/CME -- stable  Clinical management:  See below  Abbreviations: NFP - Normal foveal profile. CME - cystoid macular edema. PED - pigment epithelial detachment. IRF - intraretinal fluid. SRF - subretinal fluid. EZ - ellipsoid zone. ERM - epiretinal membrane. ORA - outer retinal atrophy. ORT - outer retinal tubulation. SRHM - subretinal hyper-reflective material. IRHM - intraretinal hyper-reflective material            ASSESSMENT/PLAN:    ICD-10-CM   1.  Cystoid macular edema of left eye  H35.352 OCT, Retina - OU - Both Eyes    2. History of retinal detachment  Z86.69     3. Epiretinal membrane (ERM) of both eyes  H35.373 OCT, Retina - OU - Both Eyes    4. Posterior vitreous detachment of right eye  H43.811     5. Essential hypertension  I10     6. Hypertensive retinopathy of both eyes  H35.033     7. Pseudophakia, both eyes  Z96.1     8. Primary open angle glaucoma of both eyes, unspecified glaucoma stage  H40.1130     9. Dry eyes  H04.123      1. CME OS  - pt with history of complicated CEIOL OS w/ RD in 2016  - history of CME since 2016 previously managed by Dr. Armanda Heritage   - was receiving intravitreal injections per pt report -- last visit w/ Dr. Posey Pronto in January 2021  - started on PF and Prolensa QID OS here on 09.02.21  - stopped latanoprost on 12.14.21  - today, BCVA OS is stable at 20/50   - OCT shows stable improvement in CME  - history of glaucoma / steroid response and was previously on timolol qdaily OU, Alphagan P BID OU, and Latanoprost qhs OU - pt reports no longer taking timolol or latanoprost per Dr. Rick Duff - latanoprost switched to [facility], but now stopped due to adverse rxn - today, CME remains stably resolved - IOP 11 OS - cont PF and Prolensa qdaily OS - cont Alphagan P BID OU - cont dorzolamide Qdaily OS  - f/u 4 months -- DFE, OCT  2. History of RD OS  - 2016 after cataract surgery Manuella Ghazi)  - s/p PPV w/ Dr. Posey Pronto in 2016  - retina attached; appears stable  - monitor  3. Epiretinal membrane, both eyes  - mild ERM OU - BCVA OD: 20/30 -- decreased; OS: 20/50 - asymptomatic, no metamorphopsia - OCT shows mild ERM with interval blunting of foveal contour OU - no indication for surgery at this time - monitor  4. PVD / vitreous syneresis OD+  - symptomatic floaters OD -- improved  - Discussed findings and prognosis  - No RT or RD on 360 peripheral exam  - Reviewed s/s of RT/RD  - Strict  return precautions for any such RT/RD signs/symptoms   - monitor  5,6. Hypertensive retinopathy OU - discussed importance of tight BP control - monitor  7. Pseudophakia OU  - s/p CE/IOL (OD: Dr. Talbert Forest, OS: Dr. Ruthell Rummage, 2016)  - s/p YAG OS -- [place], May 2021  - OS with iridonesis and poor dilation - monitor  8. POAG OU  - IOP 13,11 today  - currently on Alphagan P BID OU, Dorzolamide qdaily OS -- cont  - history of timolol/vyzulta causing irritation/adverse reactions   9. Dry eyes OU  - using Ivizia BID OU  - recommend increasing artificial tears and lubricating ointment as needed  Ophthalmic Meds Ordered this visit:  No orders of the defined types were placed in this encounter.    Return in about 4 months (around 05/20/2022) for f/u CME OS, DFE, OCT.  There are no Patient Instructions on file for this visit.   This document serves as a record of services personally performed by Gardiner Sleeper, MD, PhD. It was created on their behalf by Roselee Nova, COMT. The creation of this record is the provider's dictation and/or activities during the visit.  Electronically signed by: Roselee Nova, COMT 01/17/22 9:15 AM  This document serves as a record of services personally performed by Gardiner Sleeper, MD, PhD. It was created on their behalf by San Jetty. Owens Shark, OA an ophthalmic technician. The creation of this record is the provider's dictation and/or activities during the visit.    Electronically signed by: San Jetty. Owens Shark, [place] 10.09.2023 9:15 AM   Gardiner Sleeper, M.D., Ph.D. Diseases & Surgery of the Retina and Vitreous [facility]  I have reviewed the above documentation for accuracy and completeness, and I agree with the above. Gardiner Sleeper, M.D., Ph.D. 01/17/22 9:17 AM  Abbreviations: M myopia (nearsighted); A astigmatism; H hyperopia (farsighted); P presbyopia; Mrx spectacle prescription;  CTL contact lenses; OD right eye; OS left eye; OU both eyes   XT exotropia; ET esotropia; PEK punctate epithelial keratitis; PEE punctate epithelial erosions; DES dry eye syndrome; MGD meibomian gland dysfunction; ATs artificial tears; PFAT's preservative free artificial tears; [place] nuclear sclerotic cataract; PSC posterior subcapsular cataract; ERM epi-retinal membrane; PVD posterior vitreous detachment; RD retinal detachment; DM diabetes mellitus; DR diabetic retinopathy; NPDR non-proliferative diabetic retinopathy; PDR proliferative diabetic retinopathy; CSME clinically significant macular edema; DME diabetic macular edema; dbh dot blot hemorrhages; CWS cotton wool spot; POAG primary open angle glaucoma; C/D cup-to-disc ratio; HVF humphrey visual field; GVF goldmann visual field; OCT optical coherence tomography; IOP intraocular pressure; BRVO Branch retinal vein occlusion; CRVO central retinal vein occlusion; CRAO central retinal artery occlusion; BRAO branch retinal artery occlusion; RT retinal tear; SB scleral buckle; PPV pars plana vitrectomy; VH Vitreous hemorrhage; PRP panretinal laser photocoagulation; IVK intravitreal kenalog; VMT vitreomacular traction; MH Macular hole;  NVD neovascularization of the disc; NVE neovascularization elsewhere; AREDS age related eye disease study; ARMD age related macular degeneration; POAG primary open angle glaucoma; EBMD epithelial/anterior basement membrane dystrophy; ACIOL anterior chamber intraocular lens; IOL intraocular lens; PCIOL posterior chamber intraocular lens; Phaco/IOL phacoemulsification with intraocular lens placement; [place] photorefractive keratectomy; LASIK laser assisted in situ keratomileusis; HTN hypertension; DM diabetes mellitus; COPD chronic obstructive pulmonary disease

## 2022-01-14 ENCOUNTER — Telehealth: Payer: Self-pay | Admitting: Family Medicine

## 2022-01-14 ENCOUNTER — Other Ambulatory Visit: Payer: Self-pay

## 2022-01-14 NOTE — Telephone Encounter (Signed)
Ewing Schlein M routed conversation to [facility] Nurse 4 hours ago (11:47 AM)   Valaria Good 4 hours ago (11:47 AM)   CB Pt. Wife stopped by to ask if you would please send in Ambien #[phone] w/ 1 RF to [facility]   Also send RX for Atorvastatin and Plavix to [facility] so they can "hold" the prescription will its time for refills.

## 2022-01-14 NOTE — Telephone Encounter (Signed)
Pt. Wife stopped by to ask if you would please send in Ambien #90 w/ 1 RF to [facility]  Also send RX for Atorvastatin and Plavix to [facility] so they can "hold" the prescription will its time for refills.

## 2022-01-15 MED ORDER — CLOPIDOGREL BISULFATE 75 MG PO TABS: 75.0000 mg | ORAL_TABLET | Freq: Every day | ORAL | 4 refills | Status: DC

## 2022-01-15 MED ORDER — ATORVASTATIN CALCIUM 40 MG PO TABS: 40.0000 mg | ORAL_TABLET | Freq: Every day | ORAL | 4 refills | Status: DC

## 2022-01-17 ENCOUNTER — Ambulatory Visit (INDEPENDENT_AMBULATORY_CARE_PROVIDER_SITE_OTHER): Payer: Medicare Other | Admitting: Ophthalmology

## 2022-01-17 ENCOUNTER — Encounter (INDEPENDENT_AMBULATORY_CARE_PROVIDER_SITE_OTHER): Payer: Self-pay | Admitting: Ophthalmology

## 2022-01-17 DIAGNOSIS — H43811 Vitreous degeneration, right eye: Secondary | ICD-10-CM | POA: Diagnosis not present

## 2022-01-17 DIAGNOSIS — H35033 Hypertensive retinopathy, bilateral: Secondary | ICD-10-CM

## 2022-01-17 DIAGNOSIS — I10 Essential (primary) hypertension: Secondary | ICD-10-CM | POA: Diagnosis not present

## 2022-01-17 DIAGNOSIS — H35373 Puckering of macula, bilateral: Secondary | ICD-10-CM

## 2022-01-17 DIAGNOSIS — Z8669 Personal history of other diseases of the nervous system and sense organs: Secondary | ICD-10-CM

## 2022-01-17 DIAGNOSIS — Z961 Presence of intraocular lens: Secondary | ICD-10-CM

## 2022-01-17 DIAGNOSIS — H35352 Cystoid macular degeneration, left eye: Secondary | ICD-10-CM | POA: Diagnosis not present

## 2022-01-17 DIAGNOSIS — H04123 Dry eye syndrome of bilateral lacrimal glands: Secondary | ICD-10-CM

## 2022-01-17 DIAGNOSIS — H401130 Primary open-angle glaucoma, bilateral, stage unspecified: Secondary | ICD-10-CM

## 2022-01-18 ENCOUNTER — Telehealth: Payer: Self-pay | Admitting: Family Medicine

## 2022-01-18 NOTE — Telephone Encounter (Signed)
[facility] faxed refill request for the following medications:  amLODipine (NORVASC) 2.5 MG tablet   Please advise.

## 2022-01-20 ENCOUNTER — Telehealth: Payer: Self-pay | Admitting: Family Medicine

## 2022-01-20 DIAGNOSIS — I10 Essential (primary) hypertension: Secondary | ICD-10-CM

## 2022-01-20 MED ORDER — AMLODIPINE BESYLATE 2.5 MG PO TABS: 2.5000 mg | ORAL_TABLET | Freq: Every day | ORAL | 3 refills | Status: DC

## 2022-01-20 NOTE — Telephone Encounter (Signed)
[facility] pharmacy faxed refill request for the following medications:   amLODipine (NORVASC) 2.5 MG tablet  Please advise

## 2022-02-09 ENCOUNTER — Other Ambulatory Visit: Payer: Self-pay | Admitting: Family Medicine

## 2022-02-09 DIAGNOSIS — M47816 Spondylosis without myelopathy or radiculopathy, lumbar region: Secondary | ICD-10-CM

## 2022-02-09 NOTE — Telephone Encounter (Signed)
Requested medication (s) are due for refill today: yes  Requested medication (s) are on the active medication list: yes  Last refill:  11/18/21  Future visit scheduled: yes  Notes to clinic:  Unable to refill per protocol, cannot delegate.      Requested Prescriptions  Pending Prescriptions Disp Refills   HYDROcodone-acetaminophen (NORCO/VICODIN) 5-325 MG tablet 120 tablet 0    Sig: Take 1 tablet by mouth every 6 (six) hours as needed.     Not Delegated - Analgesics:  Opioid Agonist Combinations Failed - 02/09/2022  9:55 AM      Failed - This refill cannot be delegated      Failed - Urine Drug Screen completed in last 360 days      Passed - Valid encounter within last 3 months    Recent Outpatient Visits           1 month ago Annual physical exam   [facility] Birdie Sons, MD   4 months ago Sinusitis, unspecified chronicity, unspecified location   [facility] Birdie Sons, MD   4 months ago Prediabetes   [facility] Birdie Sons, MD   7 months ago Prediabetes   [facility] Birdie Sons, MD   1 year ago Annual physical exam   [facility] Birdie Sons, MD       Future Appointments             In 10 months Fisher, Kirstie Peri, MD [facility], [place]

## 2022-02-09 NOTE — Telephone Encounter (Signed)
Medication Refill - Medication: HYDROcodone-acetaminophen (NORCO/VICODIN) 5-325 MG tablet  Has the patient contacted their pharmacy? No.  Preferred Pharmacy (with phone number or street name):  [id number] [place]:  [phone] Fax:  [phone]    Has the patient been seen for an appointment in the last year OR does the patient have an upcoming appointment? Yes.    Agent: Please be advised that RX refills may take up to 3 business days. We ask that you follow-up with your pharmacy.

## 2022-02-10 MED ORDER — HYDROCODONE-ACETAMINOPHEN 5-325 MG PO TABS: 1.0000 | ORAL_TABLET | Freq: Four times a day (QID) | ORAL | 0 refills | Status: DC | PRN

## 2022-03-08 ENCOUNTER — Other Ambulatory Visit (INDEPENDENT_AMBULATORY_CARE_PROVIDER_SITE_OTHER): Payer: Self-pay | Admitting: Ophthalmology

## 2022-03-11 ENCOUNTER — Ambulatory Visit (INDEPENDENT_AMBULATORY_CARE_PROVIDER_SITE_OTHER): Payer: Medicare Other | Admitting: Family Medicine

## 2022-03-11 ENCOUNTER — Encounter: Payer: Self-pay | Admitting: Family Medicine

## 2022-03-11 VITALS — BP 132/71 | HR 63 | Resp 16 | Ht 68.0 in | Wt 178.0 lb

## 2022-03-11 DIAGNOSIS — J0100 Acute maxillary sinusitis, unspecified: Secondary | ICD-10-CM

## 2022-03-11 DIAGNOSIS — J3089 Other allergic rhinitis: Secondary | ICD-10-CM

## 2022-03-11 MED ORDER — CEFDINIR 300 MG PO CAPS: 600.0000 mg | ORAL_CAPSULE | Freq: Every day | ORAL | 0 refills | Status: AC

## 2022-03-11 MED ORDER — AZELASTINE HCL 0.1 % NA SOLN: 2.0000 | Freq: Two times a day (BID) | NASAL | 0 refills | Status: DC

## 2022-03-11 NOTE — Progress Notes (Signed)
I,Tiffany J Bragg,acting as a scribe for Lelon Huh, MD.,have documented all relevant documentation on the behalf of Lelon Huh, MD,as directed by  Lelon Huh, MD while in the presence of Lelon Huh, MD.   Established patient visit   Patient: Garrett Green   DOB: February 23, 1946   76 y.o. Male  MRN: [id number] Visit Date: 03/11/2022  Today's healthcare provider: Lelon Huh, MD   Chief Complaint  Patient presents with   Sinus Problem    Patient complains of sinus pressure, drainage, cough, chest congestion for 6 weeks.    Subjective    HPI  Reports persistent sinus pressure and clear drainage for the last 6 weeks. Cough is mild and non productive. Pressure in sinuses is getting progressively worse. No fevers, chills, sweats, or dyspnea. He is using fluticasone nasal spray consistently, but not currently taking loratadine or any antihistamines.   Medications: Outpatient Medications Prior to Visit  Medication Sig   amLODipine (NORVASC) 2.5 MG tablet Take 1 tablet (2.5 mg total) by mouth daily.   aspirin 325 MG tablet Take 325 mg by mouth daily.   atorvastatin (LIPITOR) 40 MG tablet Take 1 tablet (40 mg total) by mouth daily.   brimonidine (ALPHAGAN P) 0.1 % SOLN Place 1 drop into both eyes 2 (two) times daily.   Bromfenac Sodium (PROLENSA) 0.07 % SOLN Place 1 drop into the left eye daily.   clopidogrel (PLAVIX) 75 MG tablet Take 1 tablet (75 mg total) by mouth daily.   dorzolamide (TRUSOPT) 2 % ophthalmic solution INSTILL 1 DROP INTO LEFT EYE TWICE A DAY   fluticasone (FLONASE) 50 MCG/ACT nasal spray Place 2 sprays into both nostrils daily as needed for allergies.   HYDROcodone-acetaminophen (NORCO/VICODIN) 5-325 MG tablet Take 1 tablet by mouth every 6 (six) hours as needed.   loratadine (CLARITIN) 10 MG tablet Take 10 mg by mouth daily as needed for allergies.   LORazepam (ATIVAN) 1 MG tablet Take 1 tablet (1 mg total) by mouth 3 (three) times daily as needed.  (Patient taking differently: Take 1 mg by mouth 3 (three) times daily as needed (flying).)   prednisoLONE acetate (PRED FORTE) 1 % ophthalmic suspension INSTILL 1 DROP INTO LEFT EYE EVERY DAY   Pseudoeph-Doxylamine-DM-APAP (NYQUIL PO) Take 1 Dose by mouth at bedtime as needed (congestion).   ramipril (ALTACE) 10 MG capsule TAKE 1 CAPSULE BY MOUTH  DAILY   zolpidem (AMBIEN) 10 MG tablet Take 0.5-1 tablets (5-10 mg total) by mouth at bedtime as needed for sleep.   No facility-administered medications prior to visit.    Review of Systems     Objective    BP 132/71 (BP Location: Right Arm, Patient Position: Sitting, Cuff Size: Normal)   Pulse 63   Resp 16   Ht '5\' 8"'$  (1.727 m)   Wt 178 lb (80.7 kg)   SpO2 100%   BMI 27.06 kg/m    Physical Exam   General: Appearance:    Well developed, well nourished male in no acute distress  Eyes:    PERRL, conjunctiva/corneas clear, EOM's intact       ENT:   Tender frontal and maxillary sinuses, nasal mucosa pale and boggy with clear discharge.   Lungs:     Clear to auscultation bilaterally, respirations unlabored  Heart:    Normal heart rate. Normal rhythm. No murmurs, rubs, or gallops.    MS:   All extremities are intact.    Neurologic:   Awake, alert, oriented x  3. No apparent focal neurological defect.         Assessment & Plan     1. Subacute maxillary sinusitis - cefdinir (OMNICEF) 300 MG capsule; Take 2 capsules (600 mg total) by mouth daily for 7 days.  Dispense: 14 capsule; Refill: 0  2. Non-seasonal allergic rhinitis, unspecified trigger Continue fluticasone, add azelastine (ASTELIN) 0.1 % nasal spray; Place 2 sprays into both nostrils 2 (two) times daily. Use in each nostril as directed  Dispense: 30 mL; Refill: 0       The entirety of the information documented in the History of Present Illness, Review of Systems and Physical Exam were personally obtained by me. Portions of this information were initially documented by the CMA  and reviewed by me for thoroughness and accuracy.     Lelon Huh, MD  [facility] [phone] (phone) [phone] (fax)  [place]

## 2022-03-15 ENCOUNTER — Encounter: Payer: Self-pay | Admitting: Dermatology

## 2022-03-15 ENCOUNTER — Ambulatory Visit (INDEPENDENT_AMBULATORY_CARE_PROVIDER_SITE_OTHER): Payer: Medicare Other | Admitting: Dermatology

## 2022-03-15 VITALS — BP 127/80 | HR 76

## 2022-03-15 DIAGNOSIS — L821 Other seborrheic keratosis: Secondary | ICD-10-CM

## 2022-03-15 DIAGNOSIS — Z1283 Encounter for screening for malignant neoplasm of skin: Secondary | ICD-10-CM

## 2022-03-15 DIAGNOSIS — L814 Other melanin hyperpigmentation: Secondary | ICD-10-CM | POA: Diagnosis not present

## 2022-03-15 DIAGNOSIS — L570 Actinic keratosis: Secondary | ICD-10-CM

## 2022-03-15 DIAGNOSIS — D229 Melanocytic nevi, unspecified: Secondary | ICD-10-CM

## 2022-03-15 DIAGNOSIS — L578 Other skin changes due to chronic exposure to nonionizing radiation: Secondary | ICD-10-CM

## 2022-03-15 NOTE — Patient Instructions (Addendum)
Cryotherapy Aftercare  Wash gently with soap and water everyday.   Apply Vaseline daily until healed.    Recommend daily broad spectrum sunscreen SPF 30+ to sun-exposed areas, reapply every 2 hours as needed. Call for new or changing lesions.  Staying in the shade or wearing long sleeves, sun glasses (UVA+UVB protection) and wide brim hats (4-inch brim around the entire circumference of the hat) are also recommended for sun protection.    Recommend taking Heliocare sun protection supplement daily in sunny weather for additional sun protection. For maximum protection on the sunniest days, you can take up to 2 capsules of regular Heliocare OR take 1 capsule of Heliocare Ultra. For prolonged exposure (such as a full day in the sun), you can repeat your dose of the supplement 4 hours after your first dose. Heliocare can be purchased at [facility], at some [facility] or at www.heliocare.com.    Melanoma ABCDEs  Melanoma is the most dangerous type of skin cancer, and is the leading cause of death from skin disease.  You are more likely to develop melanoma if you: Have light-colored skin, light-colored eyes, or red or blond hair Spend a lot of time in the sun Tan regularly, either outdoors or in a tanning bed Have had blistering sunburns, especially during childhood Have a close family member who has had a melanoma Have atypical moles or large birthmarks  Early detection of melanoma is key since treatment is typically straightforward and cure rates are extremely high if we catch it early.   The first sign of melanoma is often a change in a mole or a new dark spot.  The ABCDE system is a way of remembering the signs of melanoma.  A for asymmetry:  The two halves do not match. B for border:  The edges of the growth are irregular. C for color:  A mixture of colors are present instead of an even brown color. D for diameter:  Melanomas are usually (but not always) greater than 6mm - the  size of a pencil eraser. E for evolution:  The spot keeps changing in size, shape, and color.  Please check your skin once per month between visits. You can use a small mirror in front and a large mirror behind you to keep an eye on the back side or your body.   If you see any new or changing lesions before your next follow-up, please call to schedule a visit.  Please continue daily skin protection including broad spectrum sunscreen SPF 30+ to sun-exposed areas, reapplying every 2 hours as needed when you're outdoors.   Staying in the shade or wearing long sleeves, sun glasses (UVA+UVB protection) and wide brim hats (4-inch brim around the entire circumference of the hat) are also recommended for sun protection.    Due to recent changes in healthcare laws, you may see results of your pathology and/or laboratory studies on MyChart before the doctors have had a chance to review them. We understand that in some cases there may be results that are confusing or concerning to you. Please understand that not all results are received at the same time and often the doctors may need to interpret multiple results in order to provide you with the best plan of care or course of treatment. Therefore, we ask that you please give us 2 business days to thoroughly review all your results before contacting the office for clarification. Should we see a critical lab result, you will be contacted sooner.     If You Need Anything After Your Visit  If you have any questions or concerns for your doctor, please call our main line at [phone] and press option 4 to reach your doctor's medical assistant. If no one answers, please leave a voicemail as directed and we will return your call as soon as possible. Messages left after 4 pm will be answered the following business day.   You may also send us a message via MyChart. We typically respond to MyChart messages within 1-2 business days.  For prescription refills, please  ask your pharmacy to contact our office. Our fax number is [phone].  If you have an urgent issue when the clinic is closed that cannot wait until the next business day, you can page your doctor at the number below.    Please note that while we do our best to be available for urgent issues outside of office hours, we are not available 24/7.   If you have an urgent issue and are unable to reach us, you may choose to seek medical care at your doctor's office, retail clinic, urgent care center, or emergency room.  If you have a medical emergency, please immediately call 911 or go to the emergency department.  Pager Numbers  - Dr. Kowalski: [phone]  - Dr. Moye: [phone]  - Dr. Stewart: [phone]  In the event of inclement weather, please call our main line at [phone] for an update on the status of any delays or closures.  Dermatology Medication Tips: Please keep the boxes that topical medications come in in order to help keep track of the instructions about where and how to use these. Pharmacies typically print the medication instructions only on the boxes and not directly on the medication tubes.   If your medication is too expensive, please contact our office at [phone] option 4 or send us a message through MyChart.   We are unable to tell what your co-pay for medications will be in advance as this is different depending on your insurance coverage. However, we may be able to find a substitute medication at lower cost or fill out paperwork to get insurance to cover a needed medication.   If a prior authorization is required to get your medication covered by your insurance company, please allow us 1-2 business days to complete this process.  Drug prices often vary depending on where the prescription is filled and some pharmacies may offer cheaper prices.  The website www.goodrx.com contains coupons for medications through different pharmacies. The prices here do  not account for what the cost may be with help from insurance (it may be cheaper with your insurance), but the website can give you the price if you did not use any insurance.  - You can print the associated coupon and take it with your prescription to the pharmacy.  - You may also stop by our office during regular business hours and pick up a GoodRx coupon card.  - If you need your prescription sent electronically to a different pharmacy, notify our office through [facility] MyChart or by phone at [phone] option 4.     Si Usted Necesita Algo Despus de Su Visita  Tambin puede enviarnos un mensaje a travs de MyChart. Por lo general respondemos a los mensajes de MyChart en el transcurso de 1 a 2 das hbiles.  Para renovar recetas, por favor pida a su farmacia que se ponga en contacto con nuestra oficina. Nuestro nmero de fax es el [phone].  Si   tiene un asunto urgente cuando la clnica est cerrada y que no puede esperar hasta el siguiente da hbil, puede llamar/localizar a su doctor(a) al nmero que aparece a continuacin.   Por favor, tenga en cuenta que aunque hacemos todo lo posible para estar disponibles para asuntos urgentes fuera del horario de oficina, no estamos disponibles las 24 horas del da, los 7 das de la semana.   Si tiene un problema urgente y no puede comunicarse con nosotros, puede optar por buscar atencin mdica  en el consultorio de su doctor(a), en una clnica privada, en un centro de atencin urgente o en una sala de emergencias.  Si tiene una emergencia mdica, por favor llame inmediatamente al 911 o vaya a la sala de emergencias.  Nmeros de bper  - Dr. Kowalski: [phone]  - Dra. Moye: [phone]  - Dra. Stewart: [phone]  En caso de inclemencias del tiempo, por favor llame a nuestra lnea principal al [phone] para una actualizacin sobre el estado de cualquier retraso o cierre.  Consejos para la medicacin en dermatologa: Por  favor, guarde las cajas en las que vienen los medicamentos de uso tpico para ayudarle a seguir las instrucciones sobre dnde y cmo usarlos. Las farmacias generalmente imprimen las instrucciones del medicamento slo en las cajas y no directamente en los tubos del medicamento.   Si su medicamento es muy caro, por favor, pngase en contacto con nuestra oficina llamando al [phone] y presione la opcin 4 o envenos un mensaje a travs de MyChart.   No podemos decirle cul ser su copago por los medicamentos por adelantado ya que esto es diferente dependiendo de la cobertura de su seguro. Sin embargo, es posible que podamos encontrar un medicamento sustituto a menor costo o llenar un formulario para que el seguro cubra el medicamento que se considera necesario.   Si se requiere una autorizacin previa para que su compaa de seguros cubra su medicamento, por favor permtanos de 1 a 2 das hbiles para completar este proceso.  Los precios de los medicamentos varan con frecuencia dependiendo del lugar de dnde se surte la receta y alguna farmacias pueden ofrecer precios ms baratos.  El sitio web www.goodrx.com tiene cupones para medicamentos de diferentes farmacias. Los precios aqu no tienen en cuenta lo que podra costar con la ayuda del seguro (puede ser ms barato con su seguro), pero el sitio web puede darle el precio si no utiliz ningn seguro.  - Puede imprimir el cupn correspondiente y llevarlo con su receta a la farmacia.  - Tambin puede pasar por nuestra oficina durante el horario de atencin regular y recoger una tarjeta de cupones de GoodRx.  - Si necesita que su receta se enve electrnicamente a una farmacia diferente, informe a nuestra oficina a travs de MyChart de [place] o por telfono llamando al [phone] y presione la opcin 4.  

## 2022-03-15 NOTE — Progress Notes (Unsigned)
Follow-Up Visit   Subjective  Garrett Green is a 76 y.o. male who presents for the following: Annual Exam (Hx of AKs. No personal Hx of skin cancer or dysplastic nevi ).  The patient presents for Upper Body Skin Exam (UBSE) for skin cancer screening and mole check.  The patient has spots, moles and lesions to be evaluated, some may be new or changing and the patient has concerns that these could be cancer.  Wife with patient.   The following portions of the chart were reviewed this encounter and updated as appropriate:  Tobacco  Allergies  Meds  Problems  Med Hx  Surg Hx  Fam Hx      Review of Systems: No other skin or systemic complaints except as noted in HPI or Assessment and Plan.   Objective  Well appearing patient in no apparent distress; mood and affect are within normal limits.  All skin waist up examined.  right forehead x5 (5) Erythematous thin papules/macules with gritty scale.    Assessment & Plan   Lentigines - Scattered tan macules - Due to sun exposure - Benign-appearing, observe - Recommend daily broad spectrum sunscreen SPF 30+ to sun-exposed areas, reapply every 2 hours as needed. - Call for any changes  Seborrheic Keratoses - Stuck-on, waxy, tan-brown papules and/or plaques  - Benign-appearing - Discussed benign etiology and prognosis. - Observe - Call for any changes  Melanocytic Nevi - Tan-brown and/or pink-flesh-colored symmetric macules and papules - Benign appearing on exam today - Observation - Call clinic for new or changing moles - Recommend daily use of broad spectrum spf 30+ sunscreen to sun-exposed areas.   Hemangiomas - Red papules - Discussed benign nature - Observe - Call for any changes  Actinic Damage - chronic, secondary to cumulative UV radiation exposure/sun exposure over time - diffuse scaly erythematous macules with underlying dyspigmentation - Recommend daily broad spectrum sunscreen SPF 30+ to sun-exposed  areas, reapply every 2 hours as needed.  - Recommend staying in the shade or wearing long sleeves, sun glasses (UVA+UVB protection) and wide brim hats (4-inch brim around the entire circumference of the hat). - Call for new or changing lesions. - May benefit from field therapy in future.  Skin cancer screening performed today.  AK (actinic keratosis) (5) right forehead x5  Actinic keratoses are precancerous spots that appear secondary to cumulative UV radiation exposure/sun exposure over time. They are chronic with expected duration over 1 year. A portion of actinic keratoses will progress to squamous cell carcinoma of the skin. It is not possible to reliably predict which spots will progress to skin cancer and so treatment is recommended to prevent development of skin cancer.  Recommend daily broad spectrum sunscreen SPF 30+ to sun-exposed areas, reapply every 2 hours as needed.  Recommend staying in the shade or wearing long sleeves, sun glasses (UVA+UVB protection) and wide brim hats (4-inch brim around the entire circumference of the hat). Call for new or changing lesions.  Destruction of lesion - right forehead x5  Destruction method: cryotherapy   Informed consent: discussed and consent obtained   Lesion destroyed using liquid nitrogen: Yes   Region frozen until ice ball extended beyond lesion: Yes   Outcome: patient tolerated procedure well with no complications   Post-procedure details: wound care instructions given   Additional details:  Prior to procedure, discussed risks of blister formation, small wound, skin dyspigmentation, or rare scar following cryotherapy. Recommend Vaseline ointment to treated areas while healing.  Return in about 1 year (around 03/16/2023) for UBSE.  I, Emelia Salisbury, CMA, am acting as scribe for Forest Gleason, MD.  Documentation: I have reviewed the above documentation for accuracy and completeness, and I agree with the above.  Forest Gleason, MD

## 2022-03-16 ENCOUNTER — Encounter: Payer: Self-pay | Admitting: Dermatology

## 2022-03-18 ENCOUNTER — Encounter (INDEPENDENT_AMBULATORY_CARE_PROVIDER_SITE_OTHER): Payer: Self-pay | Admitting: Ophthalmology

## 2022-03-19 ENCOUNTER — Other Ambulatory Visit (INDEPENDENT_AMBULATORY_CARE_PROVIDER_SITE_OTHER): Payer: Self-pay | Admitting: Ophthalmology

## 2022-03-19 MED ORDER — BRIMONIDINE TARTRATE 0.1 % OP SOLN
1.0000 [drp] | Freq: Two times a day (BID) | OPHTHALMIC | 10 refills | Status: DC
Start: 2022-03-19 — End: 2022-12-27

## 2022-03-21 ENCOUNTER — Other Ambulatory Visit: Payer: Self-pay | Admitting: Family Medicine

## 2022-03-21 DIAGNOSIS — G4700 Insomnia, unspecified: Secondary | ICD-10-CM

## 2022-03-22 NOTE — Telephone Encounter (Signed)
Requested medication (s) are due for refill today -yes  Requested medication (s) are on the active medication list -yes  Future visit scheduled -yes  Last refill: 09/14/21 #90 1RF  Notes to clinic: non delegated Rx  Requested Prescriptions  Pending Prescriptions Disp Refills   zolpidem (AMBIEN) 10 MG tablet [Pharmacy Med Name: ZOLPIDEM 10 MG TAB] 90 tablet 1    Sig: TAKE ONE-HALF TO ONE TABLET BY MOUTH AT BEDTIME AS NEEDED FOR SLEEP     Not Delegated - Psychiatry:  Anxiolytics/Hypnotics Failed - 03/21/2022  7:39 PM      Failed - This refill cannot be delegated      Failed - Urine Drug Screen completed in last 360 days      Passed - Valid encounter within last 6 months    Recent Outpatient Visits           1 week ago Subacute maxillary sinusitis   [facility] Birdie Sons, MD   2 months ago Annual physical exam   [facility] Birdie Sons, MD   6 months ago Sinusitis, unspecified chronicity, unspecified location   [facility] Birdie Sons, MD   6 months ago Prediabetes   [facility] Birdie Sons, MD   9 months ago [place], Kirstie Peri, MD       Future Appointments             In 9 months Fisher, Kirstie Peri, MD [facility], [place]               Requested Prescriptions  Pending Prescriptions Disp Refills   zolpidem (AMBIEN) 10 MG tablet [Pharmacy Med Name: ZOLPIDEM 10 MG TAB] 90 tablet 1    Sig: TAKE ONE-HALF TO ONE TABLET BY MOUTH AT BEDTIME AS NEEDED FOR SLEEP     Not Delegated - Psychiatry:  Anxiolytics/Hypnotics Failed - 03/21/2022  7:39 PM      Failed - This refill cannot be delegated      Failed - Urine Drug Screen completed in last 360 days      Passed - Valid encounter within last 6 months    Recent Outpatient Visits           1 week ago Subacute maxillary sinusitis   [facility] Birdie Sons, MD   2 months  ago Annual physical exam   [facility] Birdie Sons, MD   6 months ago Sinusitis, unspecified chronicity, unspecified location   [facility] Birdie Sons, MD   6 months ago Prediabetes   [facility] Birdie Sons, MD   9 months ago Prediabetes   [place], Kirstie Peri, MD       Future Appointments             In 9 months Fisher, Kirstie Peri, MD [facility], [place]

## 2022-03-28 ENCOUNTER — Other Ambulatory Visit: Payer: Self-pay | Admitting: Family Medicine

## 2022-03-28 DIAGNOSIS — J3089 Other allergic rhinitis: Secondary | ICD-10-CM

## 2022-03-29 NOTE — Telephone Encounter (Signed)
Requested medication (s) are due for refill today: No  Requested medication (s) are on the active medication list: Yes  Last refill:  03/11/22  Future visit scheduled: Yes  Notes to clinic:  Pharmacy requesting 90 day supply.    Requested Prescriptions  Pending Prescriptions Disp Refills   Azelastine HCl 137 MCG/SPRAY SOLN [Pharmacy Med Name: AZELASTINE 0.1% (137 MCG) SPRY]  1    Sig: PLACE 2 SPRAYS INTO BOTH NOSTRILS 2 (TWO) TIMES DAILY. USE IN EACH NOSTRIL AS DIRECTED     Ear, Nose, and Throat: Nasal Preparations - Antiallergy Passed - 03/28/2022  9:30 AM      Passed - Valid encounter within last 12 months    Recent Outpatient Visits           2 weeks ago Subacute maxillary sinusitis   [facility] Birdie Sons, MD   3 months ago Annual physical exam   [facility] Birdie Sons, MD   6 months ago Sinusitis, unspecified chronicity, unspecified location   [facility] Birdie Sons, MD   6 months ago Prediabetes   [facility] Birdie Sons, MD   9 months ago Prediabetes   [facility] Birdie Sons, MD       Future Appointments             In 9 months Fisher, Kirstie Peri, MD [facility], [place]

## 2022-03-31 ENCOUNTER — Encounter: Payer: Medicare Other | Admitting: Dermatology

## 2022-04-07 ENCOUNTER — Other Ambulatory Visit: Payer: Self-pay | Admitting: Family Medicine

## 2022-04-17 ENCOUNTER — Other Ambulatory Visit (INDEPENDENT_AMBULATORY_CARE_PROVIDER_SITE_OTHER): Payer: Self-pay | Admitting: Ophthalmology

## 2022-04-23 ENCOUNTER — Other Ambulatory Visit: Payer: Self-pay | Admitting: Family Medicine

## 2022-04-23 DIAGNOSIS — J3089 Other allergic rhinitis: Secondary | ICD-10-CM

## 2022-04-25 NOTE — Telephone Encounter (Signed)
Requested Prescriptions  Pending Prescriptions Disp Refills   Azelastine HCl 137 MCG/SPRAY SOLN [Pharmacy Med Name: AZELASTINE 0.1% (137 MCG) SPRY] 90 mL 1    Sig: PLACE 2 SPRAYS INTO BOTH NOSTRILS 2 (TWO) TIMES DAILY. USE IN EACH NOSTRIL AS DIRECTED     Ear, Nose, and Throat: Nasal Preparations - Antiallergy Passed - 04/23/2022  1:30 PM      Passed - Valid encounter within last 12 months    Recent Outpatient Visits           1 month ago Subacute maxillary sinusitis   [facility] Birdie Sons, MD   4 months ago Annual physical exam   [facility] Birdie Sons, MD   7 months ago Sinusitis, unspecified chronicity, unspecified location   [facility] Birdie Sons, MD   7 months ago Prediabetes   [facility] Birdie Sons, MD   10 months ago Prediabetes   [facility] Birdie Sons, MD       Future Appointments             In 8 months Fisher, Kirstie Peri, MD [facility], [place]

## 2022-05-05 ENCOUNTER — Other Ambulatory Visit (HOSPITAL_COMMUNITY): Payer: Self-pay | Admitting: Interventional Radiology

## 2022-05-05 DIAGNOSIS — I771 Stricture of artery: Secondary | ICD-10-CM

## 2022-05-12 NOTE — Progress Notes (Signed)
[facility] Note  05/16/2022     CHIEF COMPLAINT Patient presents for Retina Follow Up  HISTORY OF PRESENT ILLNESS: Garrett Green is a 77 y.o. male who presents to the clinic today for:   HPI     Retina Follow Up   In left eye.  This started 4 months ago.  Duration of 4 months.  Since onset it is gradually worsening.  I, the attending physician,  performed the HPI with the patient and updated documentation appropriately.        Comments   Retina follow up 4 month CME OS pt states his vision has not improved he is having a lot of watering redness and some mild itching he is using gtts as directed retaine and ivizia gtts       Last edited by Bernarda Caffey, MD on 05/17/2022  1:00 PM.    Pt states his eyes are draining a lot, he saw Dr. Rick Duff last month, but he did not see any reason for the drainage  Referring physician: Birdie Sons, MD [address] [place],  [place] [address]  HISTORICAL INFORMATION:   Selected notes from the MEDICAL RECORD NUMBER Referred by Dr. Rick Duff   CURRENT MEDICATIONS: Current Outpatient Medications (Ophthalmic Drugs)  Medication Sig   brimonidine (ALPHAGAN P) 0.1 % SOLN Place 1 drop into both eyes 2 (two) times daily.   Bromfenac Sodium (PROLENSA) 0.07 % SOLN Place 1 drop into the left eye daily.   dorzolamide (TRUSOPT) 2 % ophthalmic solution Place 1 drop into the left eye daily.   prednisoLONE acetate (PRED FORTE) 1 % ophthalmic suspension INSTILL 1 DROP INTO LEFT EYE EVERY DAY   No current facility-administered medications for this visit. (Ophthalmic Drugs)   Current Outpatient Medications (Other)  Medication Sig   amLODipine (NORVASC) 2.5 MG tablet Take 1 tablet (2.5 mg total) by mouth daily.   aspirin 325 MG tablet Take 325 mg by mouth daily.   atorvastatin (LIPITOR) 40 MG tablet Take 1 tablet (40 mg total) by mouth daily.   Azelastine HCl 137 MCG/SPRAY SOLN PLACE 2 SPRAYS INTO BOTH  NOSTRILS 2 (TWO) TIMES DAILY. USE IN EACH NOSTRIL AS DIRECTED   clopidogrel (PLAVIX) 75 MG tablet TAKE 1 TABLET BY MOUTH EVERY DAY   fluticasone (FLONASE) 50 MCG/ACT nasal spray Place 2 sprays into both nostrils daily as needed for allergies.   HYDROcodone-acetaminophen (NORCO/VICODIN) 5-325 MG tablet Take 1 tablet by mouth every 6 (six) hours as needed.   loratadine (CLARITIN) 10 MG tablet Take 10 mg by mouth daily as needed for allergies.   LORazepam (ATIVAN) 1 MG tablet Take 1 tablet (1 mg total) by mouth 3 (three) times daily as needed. (Patient taking differently: Take 1 mg by mouth 3 (three) times daily as needed (flying).)   Pseudoeph-Doxylamine-DM-APAP (NYQUIL PO) Take 1 Dose by mouth at bedtime as needed (congestion).   ramipril (ALTACE) 10 MG capsule TAKE 1 CAPSULE BY MOUTH  DAILY   zolpidem (AMBIEN) 10 MG tablet TAKE ONE-HALF TO ONE TABLET BY MOUTH AT BEDTIME AS NEEDED FOR SLEEP   No current facility-administered medications for this visit. (Other)   REVIEW OF SYSTEMS: ROS   Positive for: Gastrointestinal, Neurological, Musculoskeletal, Cardiovascular, Eyes Negative for: Constitutional, Skin, Genitourinary, HENT, Endocrine, Respiratory, Psychiatric, Allergic/Imm, Heme/Lymph Last edited by Parthenia Ames, COT on 05/16/2022  8:20 AM.     ALLERGIES Allergies  Allergen Reactions   Sulfa Antibiotics Other (See Comments)  Lethargic and hypotension   Clavulanic Acid Diarrhea   PAST MEDICAL HISTORY Past Medical History:  Diagnosis Date   COVID-19 07/2019   History of blood transfusion 2014   "related to back OR"   History of chicken pox    Hypertensive retinopathy    OU   Retinal detachment    OS   Stroke ([place])    TIA (transient ischemic attack) 10/17/2017; 10/19/2017   Past Surgical History:  Procedure Laterality Date   BACK SURGERY     CATARACT EXTRACTION Right 05/2019   Dr. Talbert Forest   CATARACT EXTRACTION Left 2017   CATARACT EXTRACTION W/ INTRAOCULAR LENS  IMPLANT Left    COLONOSCOPY WITH PROPOFOL N/A 03/16/2016   Procedure: COLONOSCOPY WITH PROPOFOL;  Surgeon: Manya Silvas, MD;  Location: [facility];  Service: Endoscopy;  Laterality: N/A;   ESOPHAGOGASTRODUODENOSCOPY (EGD) WITH ESOPHAGEAL DILATION  1990s   EYE SURGERY Bilateral    Cat Sx   IR ANGIO INTRA EXTRACRAN SEL COM CAROTID INNOMINATE BILAT MOD SED  10/31/2017   IR ANGIO INTRA EXTRACRAN SEL COM CAROTID INNOMINATE BILAT MOD SED  01/15/2019   IR ANGIO INTRA EXTRACRAN SEL COM CAROTID INNOMINATE BILAT MOD SED  09/27/2019   IR ANGIO INTRA EXTRACRAN SEL COM CAROTID INNOMINATE BILAT MOD SED  10/29/2021   IR ANGIO VERTEBRAL SEL VERTEBRAL BILAT MOD SED  10/31/2017   IR ANGIO VERTEBRAL SEL VERTEBRAL BILAT MOD SED  01/15/2019   IR ANGIO VERTEBRAL SEL VERTEBRAL BILAT MOD SED  09/27/2019   IR ANGIO VERTEBRAL SEL VERTEBRAL BILAT MOD SED  10/29/2021   IR RADIOLOGIST EVAL & MGMT  10/20/2021   IR US GUIDE VASC ACCESS RIGHT  01/15/2019   IR US GUIDE VASC ACCESS RIGHT  09/27/2019   IR US GUIDE VASC ACCESS RIGHT  10/29/2021   LUMBAR LAMINECTOMY/DECOMPRESSION MICRODISCECTOMY Left 07/26/2012   Procedure: LUMBAR LAMINECTOMY/DECOMPRESSION MICRODISCECTOMY 1 LEVEL;  Surgeon: Eustace Moore, MD;  Location: [facility];  Service: Neurosurgery;  Laterality: Left;  lumbar five sacral one   RETINAL DETACHMENT SURGERY Left 2016   Dr. Posey Pronto   SHOULDER ARTHROSCOPY WITH OPEN ROTATOR CUFF REPAIR Left 1990s   TONSILLECTOMY     YAG LASER APPLICATION Left 93/2671   Dr. Talbert Forest   FAMILY HISTORY Family History  Problem Relation Age of Onset   Heart disease Mother    Cancer Sister    Dementia Brother    Dementia Sister    Diabetes Brother    Diabetes Sister    Colon cancer Neg Hx    Prostate cancer Neg Hx    SOCIAL HISTORY Social History   Tobacco Use   Smoking status: Former    Packs/day: 1.00    Years: 25.00    Total pack years: 25.00    Types: Cigarettes    Quit date: 03/03/1988    Years since quitting: 34.2    Smokeless tobacco: Never  Vaping Use   Vaping Use: Never used  Substance Use Topics   Alcohol use: Yes    Alcohol/week: 1.0 standard drink of alcohol    Types: 1 Standard drinks or equivalent per week   Drug use: Never       OPHTHALMIC EXAM:  Base Eye Exam     Visual Acuity (Snellen - Linear)       Right Left   Dist [place] 20/30 -1 20/60 -1   Dist ph [place] NI NI         Tonometry (Tonopen, 8:23 AM)  Right Left   Pressure 17 14         Pupils       Pupils Dark Light Shape React APD   Right PERRL 2 1 Round Brisk None   Left PERRL 2 1 Round Brisk None         Visual Fields       Left Right    Full Full         Extraocular Movement       Right Left    Full, Ortho Full, Ortho         Neuro/Psych     Oriented x3: Yes   Mood/Affect: Normal         Dilation     Both eyes: 2.5% Phenylephrine @ 8:23 AM           Slit Lamp and Fundus Exam     Slit Lamp Exam       Right Left   Lids/Lashes Dermatochalasis - upper lid Dermatochalasis - upper lid, mild MGD   Conjunctiva/Sclera Mild nasal Pinguecula, 1+Injection 1-2+ Injection   Cornea Mild arcus, well healed temporal cataract wounds, mild tear film debris Mild arcus, well healed temporal cataract wounds, 1-2+ inferior Punctate epithelial erosions, mild tear film debris   Anterior Chamber deep and clear deep and clear   Iris Round and moderately dilated mild iridodonesis, Round and poorly dilated   Lens PC IOL in good position PC IOL with fine pigment depositon, open PC   Anterior Vitreous Vitreous syneresis, Posterior vitreous detachment, vitreous condensations, Weiss ring Vitreous syneresis         Fundus Exam       Right Left   Disc Pink and Sharp mild pallor, sharp rim, +PPA, +cupping, very thin superior rim   C/D Ratio 0.6 0.85   Macula Flat, Blunted foveal reflex, ERM, mild RPE mottling and clumping, No heme or edema Blunted foveal reflex, central CME - stably improved, +ERM, RPE  mottling and clumping, no heme   Vessels attenuated, mild tortuosity Attenuated, mild tortuosity   Periphery Attached, No heme limited view due to poor dilation, attached, no heme, inferior paving stone degeneration / CR atrophy           IMAGING AND PROCEDURES  Imaging and Procedures for 05/16/2022  OCT, Retina - OU - Both Eyes       Right Eye Quality was good. Central Foveal Thickness: 381. Progression has been stable. Findings include no IRF, no SRF, abnormal foveal contour, epiretinal membrane (ERM with mild blunted foveal contour -- stable).   Left Eye Quality was good. Central Foveal Thickness: 305. Progression has been stable. Findings include no IRF, no SRF, abnormal foveal contour, epiretinal membrane, macular pucker (Mild ERM with blunted foveal profile and mild pucker; no IRF/CME).   Notes *Images captured and stored on drive  Diagnosis / Impression:  OD: ERM with mild blunted foveal contour -- stable OS: Mild ERM with blunted foveal profile and mild pucker; no IRF/CME -- stable  Clinical management:  See below  Abbreviations: NFP - Normal foveal profile. CME - cystoid macular edema. PED - pigment epithelial detachment. IRF - intraretinal fluid. SRF - subretinal fluid. EZ - ellipsoid zone. ERM - epiretinal membrane. ORA - outer retinal atrophy. ORT - outer retinal tubulation. SRHM - subretinal hyper-reflective material. IRHM - intraretinal hyper-reflective material             ASSESSMENT/PLAN:    ICD-10-CM   1. Cystoid macular edema of left eye  H35.352 OCT, Retina - OU - Both Eyes    2. History of retinal detachment  Z86.69     3. Epiretinal membrane (ERM) of both eyes  H35.373     4. Posterior vitreous detachment of right eye  H43.811     5. Essential hypertension  I10     6. Hypertensive retinopathy of both eyes  H35.033     7. Pseudophakia, both eyes  Z96.1     8. Primary open angle glaucoma of both eyes, unspecified glaucoma stage  H40.1130      9. Dry eyes  H04.123       1. CME OS  - pt with history of complicated CEIOL OS w/ RD in 2016  - history of CME since 2016 previously managed by Dr. Armanda Heritage   - was receiving intravitreal injections per pt report -- last visit w/ Dr. Posey Pronto in January 2021  - started on PF and Prolensa QID OS here on 09.02.21  - stopped latanoprost on 12.14.21  - today, BCVA OS is decreased to 20/60   - OCT shows stable improvement in CME  - history of glaucoma / steroid response and was previously on timolol qdaily OU, Alphagan P BID OU, and Latanoprost qhs OU - pt reports no longer taking timolol or latanoprost per Dr. Rick Duff - latanoprost switched to Vyzulta, but now stopped due to adverse rxn - today, CME remains stably resolved - IOP 14 OS - cont PF and Prolensa qdaily OS - cont Alphagan P BID OU - cont dorzolamide Qdaily OS  - f/u 4-6 months -- DFE, OCT  2. History of RD OS  - 2016 after cataract surgery Manuella Ghazi)  - s/p PPV w/ Dr. Posey Pronto in 2016  - retina attached; appears stable  - monitor  3. Epiretinal membrane, both eyes  - mild ERM OU - BCVA OD: 20/30 -- decreased; OS: 20/50 - asymptomatic, no metamorphopsia - OCT shows mild ERM with interval blunting of foveal contour OU - no indication for surgery at this time - monitor  4. PVD / vitreous syneresis OD+  - symptomatic floaters OD -- improved  - Discussed findings and prognosis  - No RT or RD on 360 peripheral exam  - Reviewed s/s of RT/RD  - Strict return precautions for any such RT/RD signs/symptoms   - monitor  5,6. Hypertensive retinopathy OU - discussed importance of tight BP control - monitor  7. Pseudophakia OU  - s/p CE/IOL (OD: Dr. Talbert Forest, OS: Dr. Ruthell Rummage, 2016)  - s/p YAG OS -- [place], May 2021  - OS with iridonesis and poor dilation - monitor  8. POAG OU  - IOP 17,14 today  - currently on Alphagan P BID OU, Dorzolamide qdaily OS -- cont  - history of timolol/vyzulta causing irritating/allergic  reactions   9. Dry eyes OU  - using Ivizia BID OU  - recommend increasing artificial tears and lubricating ointment as needed  Ophthalmic Meds Ordered this visit:  No orders of the defined types were placed in this encounter.    Return for f/u 4-6 months, CME OS.  There are no Patient Instructions on file for this visit.   This document serves as a record of services personally performed by Gardiner Sleeper, MD, PhD. It was created on their behalf by Roselee Nova, COMT. The creation of this record is the provider's dictation and/or activities during the visit.  Electronically signed by: Roselee Nova, COMT 05/17/22 1:00 PM   This document serves  as a record of services personally performed by Gardiner Sleeper, MD, PhD. It was created on their behalf by San Jetty. Owens Shark, OA an ophthalmic technician. The creation of this record is the provider's dictation and/or activities during the visit.    Electronically signed by: San Jetty. Owens Shark, [place] 02.05.2024 1:00 PM   Gardiner Sleeper, M.D., Ph.D. Diseases & Surgery of the Retina and Vitreous [facility]  I have reviewed the above documentation for accuracy and completeness, and I agree with the above. Gardiner Sleeper, M.D., Ph.D. 05/17/22 1:02 PM   Abbreviations: M myopia (nearsighted); A astigmatism; H hyperopia (farsighted); P presbyopia; Mrx spectacle prescription;  CTL contact lenses; OD right eye; OS left eye; OU both eyes  XT exotropia; ET esotropia; PEK punctate epithelial keratitis; PEE punctate epithelial erosions; DES dry eye syndrome; MGD meibomian gland dysfunction; ATs artificial tears; PFAT's preservative free artificial tears; [place] nuclear sclerotic cataract; PSC posterior subcapsular cataract; ERM epi-retinal membrane; PVD posterior vitreous detachment; RD retinal detachment; DM diabetes mellitus; DR diabetic retinopathy; NPDR non-proliferative diabetic retinopathy; PDR proliferative diabetic retinopathy; CSME  clinically significant macular edema; DME diabetic macular edema; dbh dot blot hemorrhages; CWS cotton wool spot; POAG primary open angle glaucoma; C/D cup-to-disc ratio; HVF humphrey visual field; GVF goldmann visual field; OCT optical coherence tomography; IOP intraocular pressure; BRVO Branch retinal vein occlusion; CRVO central retinal vein occlusion; CRAO central retinal artery occlusion; BRAO branch retinal artery occlusion; RT retinal tear; SB scleral buckle; PPV pars plana vitrectomy; VH Vitreous hemorrhage; PRP panretinal laser photocoagulation; IVK intravitreal kenalog; VMT vitreomacular traction; MH Macular hole;  NVD neovascularization of the disc; NVE neovascularization elsewhere; AREDS age related eye disease study; ARMD age related macular degeneration; POAG primary open angle glaucoma; EBMD epithelial/anterior basement membrane dystrophy; ACIOL anterior chamber intraocular lens; IOL intraocular lens; PCIOL posterior chamber intraocular lens; Phaco/IOL phacoemulsification with intraocular lens placement; [place] photorefractive keratectomy; LASIK laser assisted in situ keratomileusis; HTN hypertension; DM diabetes mellitus; COPD chronic obstructive pulmonary disease

## 2022-05-16 ENCOUNTER — Other Ambulatory Visit: Payer: Self-pay | Admitting: Family Medicine

## 2022-05-16 ENCOUNTER — Ambulatory Visit (INDEPENDENT_AMBULATORY_CARE_PROVIDER_SITE_OTHER): Payer: Medicare Other | Admitting: Ophthalmology

## 2022-05-16 DIAGNOSIS — Z8669 Personal history of other diseases of the nervous system and sense organs: Secondary | ICD-10-CM

## 2022-05-16 DIAGNOSIS — H35352 Cystoid macular degeneration, left eye: Secondary | ICD-10-CM | POA: Diagnosis not present

## 2022-05-16 DIAGNOSIS — Z961 Presence of intraocular lens: Secondary | ICD-10-CM

## 2022-05-16 DIAGNOSIS — H35373 Puckering of macula, bilateral: Secondary | ICD-10-CM

## 2022-05-16 DIAGNOSIS — I10 Essential (primary) hypertension: Secondary | ICD-10-CM

## 2022-05-16 DIAGNOSIS — H401130 Primary open-angle glaucoma, bilateral, stage unspecified: Secondary | ICD-10-CM

## 2022-05-16 DIAGNOSIS — H43811 Vitreous degeneration, right eye: Secondary | ICD-10-CM

## 2022-05-16 DIAGNOSIS — H04123 Dry eye syndrome of bilateral lacrimal glands: Secondary | ICD-10-CM

## 2022-05-16 DIAGNOSIS — M47816 Spondylosis without myelopathy or radiculopathy, lumbar region: Secondary | ICD-10-CM

## 2022-05-16 DIAGNOSIS — H35033 Hypertensive retinopathy, bilateral: Secondary | ICD-10-CM

## 2022-05-16 MED ORDER — HYDROCODONE-ACETAMINOPHEN 5-325 MG PO TABS: 1.0000 | ORAL_TABLET | Freq: Four times a day (QID) | ORAL | 0 refills | Status: DC | PRN

## 2022-05-17 ENCOUNTER — Encounter (INDEPENDENT_AMBULATORY_CARE_PROVIDER_SITE_OTHER): Payer: Self-pay | Admitting: Ophthalmology

## 2022-05-18 ENCOUNTER — Ambulatory Visit (HOSPITAL_COMMUNITY)
Admission: RE | Admit: 2022-05-18 | Discharge: 2022-05-18 | Disposition: A | Discharge status: Home / Self Care | Payer: Medicare Other | Source: Ambulatory Visit | Attending: Interventional Radiology | Admitting: Interventional Radiology

## 2022-05-18 DIAGNOSIS — I771 Stricture of artery: Secondary | ICD-10-CM | POA: Insufficient documentation

## 2022-05-18 MED ORDER — IOHEXOL 350 MG/ML SOLN
75.0000 mL | Freq: Once | INTRAVENOUS | Status: AC | PRN
Start: 2022-05-18 — End: 2022-05-18
  Administered 2022-05-18: 75 mL via INTRAVENOUS

## 2022-05-23 ENCOUNTER — Telehealth (HOSPITAL_COMMUNITY): Payer: Self-pay

## 2022-05-23 NOTE — Telephone Encounter (Signed)
Pt agreed to f/u in 6-8 months with a cta head/neck. AB

## 2022-08-08 ENCOUNTER — Other Ambulatory Visit: Payer: Self-pay | Admitting: Family Medicine

## 2022-08-08 DIAGNOSIS — G4700 Insomnia, unspecified: Secondary | ICD-10-CM

## 2022-09-03 MED ORDER — ZOLPIDEM TARTRATE 10 MG PO TABS: ORAL_TABLET | ORAL | 1 refills | Status: DC

## 2022-09-15 ENCOUNTER — Other Ambulatory Visit: Payer: Self-pay | Admitting: Family Medicine

## 2022-09-15 DIAGNOSIS — J3089 Other allergic rhinitis: Secondary | ICD-10-CM

## 2022-09-15 NOTE — Telephone Encounter (Signed)
Requested Prescriptions  Pending Prescriptions Disp Refills   Azelastine HCl 137 MCG/SPRAY SOLN [Pharmacy Med Name: AZELASTINE 0.1% (137 MCG) SPRY] 90 mL 1    Sig: PLACE 2 SPRAYS INTO BOTH NOSTRILS 2 (TWO) TIMES DAILY AS DIRECTED     Ear, Nose, and Throat: Nasal Preparations - Antiallergy Passed - 09/15/2022 11:33 AM      Passed - Valid encounter within last 12 months    Recent Outpatient Visits           6 months ago Subacute maxillary sinusitis   [facility] Malva Limes, MD   8 months ago Annual physical exam   [facility] Malva Limes, MD   11 months ago Sinusitis, unspecified chronicity, unspecified location   [facility] Malva Limes, MD   1 year ago Prediabetes   [facility] Malva Limes, MD   1 year ago Prediabetes   [facility] Malva Limes, MD       Future Appointments             In 3 months Fisher, Demetrios Isaacs, MD [facility]

## 2022-09-26 ENCOUNTER — Encounter (INDEPENDENT_AMBULATORY_CARE_PROVIDER_SITE_OTHER): Payer: Medicare Other | Admitting: Ophthalmology

## 2022-09-27 NOTE — Progress Notes (Signed)
[facility] - Clinic Note  10/05/2022     CHIEF COMPLAINT Patient presents for Retina Follow Up  HISTORY OF PRESENT ILLNESS: Garrett Green is a 77 y.o. male who presents to the clinic today for:   HPI     Retina Follow Up   Patient presents with  Other.  In left eye.  This started 4 months ago.  I, the attending physician,  performed the HPI with the patient and updated documentation appropriately.        Comments   Patient here for 4 month retina follow up for CME OS. Patient states vision hasn't changed. No worse. No eye pain.       Last edited by Rennis Chris, MD on 10/07/2022  2:20 AM.    Pt is using PF and Prolensa once a day, dorzolamide BID OU  Referring physician: Malva Limes, MD [address] [place],  [place] [address]  HISTORICAL INFORMATION:   Selected notes from the MEDICAL RECORD NUMBER Referred by Dr. Dion Body   CURRENT MEDICATIONS: Current Outpatient Medications (Ophthalmic Drugs)  Medication Sig   brimonidine (ALPHAGAN P) 0.1 % SOLN Place 1 drop into both eyes 2 (two) times daily.   Bromfenac Sodium (PROLENSA) 0.07 % SOLN Place 1 drop into the left eye daily.   dorzolamide (TRUSOPT) 2 % ophthalmic solution Place 1 drop into the left eye daily.   prednisoLONE acetate (PRED FORTE) 1 % ophthalmic suspension INSTILL 1 DROP INTO LEFT EYE EVERY DAY   No current facility-administered medications for this visit. (Ophthalmic Drugs)   Current Outpatient Medications (Other)  Medication Sig   amLODipine (NORVASC) 2.5 MG tablet Take 1 tablet (2.5 mg total) by mouth daily.   aspirin 325 MG tablet Take 325 mg by mouth daily.   atorvastatin (LIPITOR) 40 MG tablet Take 1 tablet (40 mg total) by mouth daily.   Azelastine HCl 137 MCG/SPRAY SOLN PLACE 2 SPRAYS INTO BOTH NOSTRILS 2 (TWO) TIMES DAILY AS DIRECTED   clopidogrel (PLAVIX) 75 MG tablet TAKE 1 TABLET BY MOUTH EVERY DAY   fluticasone (FLONASE) 50 MCG/ACT nasal spray  Place 2 sprays into both nostrils daily as needed for allergies.   HYDROcodone-acetaminophen (NORCO/VICODIN) 5-325 MG tablet Take 1 tablet by mouth every 6 (six) hours as needed.   loratadine (CLARITIN) 10 MG tablet Take 10 mg by mouth daily as needed for allergies.   Pseudoeph-Doxylamine-DM-APAP (NYQUIL PO) Take 1 Dose by mouth at bedtime as needed (congestion).   ramipril (ALTACE) 10 MG capsule TAKE 1 CAPSULE BY MOUTH  DAILY   zolpidem (AMBIEN) 10 MG tablet TAKE ONE-HALF TO ONE TABLET BY MOUTH AT BEDTIME AS NEEDED FOR SLEEP   No current facility-administered medications for this visit. (Other)   REVIEW OF SYSTEMS: ROS   Positive for: Gastrointestinal, Neurological, Musculoskeletal, Cardiovascular, Eyes Negative for: Constitutional, Skin, Genitourinary, HENT, Endocrine, Respiratory, Psychiatric, Allergic/Imm, Heme/Lymph Last edited by Laddie Aquas, COA on 10/05/2022  8:26 AM.      ALLERGIES Allergies  Allergen Reactions   Sulfa Antibiotics Other (See Comments)    Lethargic and hypotension   Clavulanic Acid Diarrhea   PAST MEDICAL HISTORY Past Medical History:  Diagnosis Date   COVID-19 07/2019   History of blood transfusion 2014   "related to back OR"   History of chicken pox    Hypertensive retinopathy    OU   Retinal detachment    OS   Stroke (HCC)    TIA (transient ischemic attack)  10/17/2017; 10/19/2017   Past Surgical History:  Procedure Laterality Date   BACK SURGERY     CATARACT EXTRACTION Right 05/2019   Dr. Vonna Kotyk   CATARACT EXTRACTION Left 2017   CATARACT EXTRACTION W/ INTRAOCULAR LENS IMPLANT Left    COLONOSCOPY WITH PROPOFOL N/A 03/16/2016   Procedure: COLONOSCOPY WITH PROPOFOL;  Surgeon: Scot Jun, MD;  Location: [facility] ENDOSCOPY;  Service: Endoscopy;  Laterality: N/A;   ESOPHAGOGASTRODUODENOSCOPY (EGD) WITH ESOPHAGEAL DILATION  1990s   EYE SURGERY Bilateral    Cat Sx   IR ANGIO INTRA EXTRACRAN SEL COM CAROTID INNOMINATE BILAT MOD SED  10/31/2017    IR ANGIO INTRA EXTRACRAN SEL COM CAROTID INNOMINATE BILAT MOD SED  01/15/2019   IR ANGIO INTRA EXTRACRAN SEL COM CAROTID INNOMINATE BILAT MOD SED  09/27/2019   IR ANGIO INTRA EXTRACRAN SEL COM CAROTID INNOMINATE BILAT MOD SED  10/29/2021   IR ANGIO VERTEBRAL SEL VERTEBRAL BILAT MOD SED  10/31/2017   IR ANGIO VERTEBRAL SEL VERTEBRAL BILAT MOD SED  01/15/2019   IR ANGIO VERTEBRAL SEL VERTEBRAL BILAT MOD SED  09/27/2019   IR ANGIO VERTEBRAL SEL VERTEBRAL BILAT MOD SED  10/29/2021   IR RADIOLOGIST EVAL & MGMT  10/20/2021   IR US GUIDE VASC ACCESS RIGHT  01/15/2019   IR US GUIDE VASC ACCESS RIGHT  09/27/2019   IR US GUIDE VASC ACCESS RIGHT  10/29/2021   LUMBAR LAMINECTOMY/DECOMPRESSION MICRODISCECTOMY Left 07/26/2012   Procedure: LUMBAR LAMINECTOMY/DECOMPRESSION MICRODISCECTOMY 1 LEVEL;  Surgeon: Tia Alert, MD;  Location: [facility] NEURO ORS;  Service: Neurosurgery;  Laterality: Left;  lumbar five sacral one   RETINAL DETACHMENT SURGERY Left 2016   Dr. Allena Katz   SHOULDER ARTHROSCOPY WITH OPEN ROTATOR CUFF REPAIR Left 1990s   TONSILLECTOMY     YAG LASER APPLICATION Left 08/2019   Dr. Vonna Kotyk   FAMILY HISTORY Family History  Problem Relation Age of Onset   Heart disease Mother    Cancer Sister    Dementia Brother    Dementia Sister    Diabetes Brother    Diabetes Sister    Colon cancer Neg Hx    Prostate cancer Neg Hx    SOCIAL HISTORY Social History   Tobacco Use   Smoking status: Former    Packs/day: 1.00    Years: 25.00    Additional pack years: 0.00    Total pack years: 25.00    Types: Cigarettes    Quit date: 03/03/1988    Years since quitting: 34.6   Smokeless tobacco: Never  Vaping Use   Vaping Use: Never used  Substance Use Topics   Alcohol use: Yes    Alcohol/week: 1.0 standard drink of alcohol    Types: 1 Standard drinks or equivalent per week   Drug use: Never       OPHTHALMIC EXAM:  Base Eye Exam     Visual Acuity (Snellen - Linear)       Right Left   Dist [place] 20/20  20/60 +2   Dist ph [place]  20/50 -2         Tonometry (Tonopen, 8:24 AM)       Right Left   Pressure 13 13         Pupils       Dark Light Shape React APD   Right 2 1 Round Brisk None   Left 2 1 Round Brisk None         Visual Fields (Counting fingers)  Left Right    Full Full         Extraocular Movement       Right Left    Full, Ortho Full, Ortho         Neuro/Psych     Oriented x3: Yes   Mood/Affect: Normal         Dilation     Both eyes: 1.0% Mydriacyl, 2.5% Phenylephrine @ 8:24 AM           Slit Lamp and Fundus Exam     Slit Lamp Exam       Right Left   Lids/Lashes Dermatochalasis - upper lid Dermatochalasis - upper lid   Conjunctiva/Sclera Mild nasal Pinguecula, 1+Injection White and quiet   Cornea Mild arcus, well healed temporal cataract wounds, mild tear film debris Mild arcus, well healed temporal cataract wounds, 2-3+ inferior Punctate epithelial erosions, mild tear film debris   Anterior Chamber deep and clear deep and clear   Iris Round and moderately dilated mild iridodonesis, Round and poorly dilated to 3.78mm   Lens PC IOL in good position PC IOL with fine pigment depositon, open PC   Anterior Vitreous Vitreous syneresis, Posterior vitreous detachment, vitreous condensations, Weiss ring Vitreous syneresis         Fundus Exam       Right Left   Disc Pink and Sharp mild pallor, sharp rim, +PPA, +cupping, very thin superior rim   C/D Ratio 0.6 0.85   Macula Flat, Blunted foveal reflex, ERM, mild RPE mottling and clumping, No heme or edema Blunted foveal reflex, central CME - stably improved, +ERM, RPE mottling and clumping, no heme   Vessels attenuated, mild tortuosity Attenuated, mild tortuosity   Periphery Attached, No heme limited view due to poor dilation, attached, no heme, inferior paving stone degeneration / CR atrophy           IMAGING AND PROCEDURES  Imaging and Procedures for 10/05/2022  OCT, Retina - OU -  Both Eyes       Right Eye Quality was good. Central Foveal Thickness: 380. Progression has been stable. Findings include no IRF, no SRF, abnormal foveal contour, epiretinal membrane, macular pucker (ERM with mild blunted foveal contour and mild pucker -- stable).   Left Eye Quality was good. Central Foveal Thickness: 303. Progression has been stable. Findings include no IRF, no SRF, abnormal foveal contour, epiretinal membrane, macular pucker (Mild ERM with blunted foveal profile and mild pucker; trace cystic changes, no frank CME).   Notes *Images captured and stored on drive  Diagnosis / Impression:  OD: ERM with mild blunted foveal contour and mild pucker-- stable OS: Mild ERM with blunted foveal profile and mild pucker; trace cystic changes, no frank CME  Clinical management:  See below  Abbreviations: NFP - Normal foveal profile. CME - cystoid macular edema. PED - pigment epithelial detachment. IRF - intraretinal fluid. SRF - subretinal fluid. EZ - ellipsoid zone. ERM - epiretinal membrane. ORA - outer retinal atrophy. ORT - outer retinal tubulation. SRHM - subretinal hyper-reflective material. IRHM - intraretinal hyper-reflective material              ASSESSMENT/PLAN:    ICD-10-CM   1. Cystoid macular edema of left eye  H35.352 OCT, Retina - OU - Both Eyes    2. History of retinal detachment  Z86.69     3. Epiretinal membrane (ERM) of both eyes  H35.373     4. Posterior vitreous detachment of right eye  H43.811  5. Essential hypertension  I10     6. Hypertensive retinopathy of both eyes  H35.033     7. Pseudophakia, both eyes  Z96.1     8. Primary open angle glaucoma of both eyes, unspecified glaucoma stage  H40.1130     9. Dry eyes  H04.123      1. CME OS  - pt with history of complicated CEIOL OS w/ RD in 2016  - history of CME since 2016 previously managed by Dr. Arrie Aran   - was receiving intravitreal injections per pt report -- last visit w/ Dr.  Allena Katz in January 2021  - started on PF and Prolensa QID OS here on 09.02.21  - stopped latanoprost on 12.14.21  - today, BCVA OS is improved to 20/50   - OCT shows Mild ERM with blunted foveal profile and mild pucker; trace cystic changes, no frank CME  - history of glaucoma / steroid response and was previously on timolol qdaily OU, Alphagan P BID OU, and Latanoprost qhs OU - pt reports no longer taking timolol or latanoprost per Dr. Dion Body - latanoprost switched to Vyzulta, but now stopped due to adverse rxn - today, CME remains stably resolved - IOP 14 OS - cont PF and Prolensa qdaily OS - cont dorzolamide BID OU  - f/u 4-6 months -- DFE, OCT  2. History of RD OS  - 2016 after cataract surgery Sherryll Burger)  - s/p PPV w/ Dr. Allena Katz in 2016  - retina attached; appears stable  - monitor  3. Epiretinal membrane, both eyes  - mild ERM OU - BCVA OD: 20/20 -- improved; OS: 20/50 -- stable - asymptomatic, no metamorphopsia - OCT shows mild ERM with interval blunting of foveal contour and mild pucker OU - no indication for surgery at this time - monitor  4. PVD / vitreous syneresis OD+  - symptomatic floaters OD -- improved  - Discussed findings and prognosis  - No RT or RD on 360 peripheral exam  - Reviewed s/s of RT/RD  - Strict return precautions for any such RT/RD signs/symptoms   - monitor  5,6. Hypertensive retinopathy OU - discussed importance of tight BP control - monitor  7. Pseudophakia OU  - s/p CE/IOL (OD: Dr. Vonna Kotyk, OS: Dr. Maryelizabeth Kaufmann, 2016)  - s/p YAG OS -- [place], May 2021  - OS with iridonesis and poor dilation - monitor  8. POAG OU  - IOP 13 OU today  - currently on Dorzolamide BID OU -- cont  - history of timolol/vyzulta causing irritating/allergic reactions   9. Dry eyes OU  - using Ivizia BID OU  - recommend increasing artificial tears and lubricating ointment as needed  Ophthalmic Meds Ordered this visit:  No orders of the defined types were placed in  this encounter.    Return for f/u 4-6 months, CME OS, DFE, OCT.  There are no Patient Instructions on file for this visit.   This document serves as a record of services personally performed by Karie Chimera, MD, PhD. It was created on their behalf by De Blanch, an ophthalmic technician. The creation of this record is the provider's dictation and/or activities during the visit.    Electronically signed by: De Blanch, OA, 10/07/22  3:14 AM  This document serves as a record of services personally performed by Karie Chimera, MD, PhD. It was created on their behalf by Glee Arvin. Manson Passey, OA an ophthalmic technician. The creation of this record is the provider's dictation  and/or activities during the visit.    Electronically signed by: Glee Arvin. Manson Passey, OA 10/07/22 3:14 AM   Karie Chimera, M.D., Ph.D. Diseases & Surgery of the Retina and Vitreous [facility]  I have reviewed the above documentation for accuracy and completeness, and I agree with the above. Karie Chimera, M.D., Ph.D. 10/07/22 3:15 AM   Abbreviations: M myopia (nearsighted); A astigmatism; H hyperopia (farsighted); P presbyopia; Mrx spectacle prescription;  CTL contact lenses; OD right eye; OS left eye; OU both eyes  XT exotropia; ET esotropia; PEK punctate epithelial keratitis; PEE punctate epithelial erosions; DES dry eye syndrome; MGD meibomian gland dysfunction; ATs artificial tears; PFAT's preservative free artificial tears; NSC nuclear sclerotic cataract; PSC posterior subcapsular cataract; ERM epi-retinal membrane; PVD posterior vitreous detachment; RD retinal detachment; DM diabetes mellitus; DR diabetic retinopathy; NPDR non-proliferative diabetic retinopathy; PDR proliferative diabetic retinopathy; CSME clinically significant macular edema; DME diabetic macular edema; dbh dot blot hemorrhages; CWS cotton wool spot; POAG primary open angle glaucoma; C/D cup-to-disc ratio; HVF  humphrey visual field; GVF goldmann visual field; OCT optical coherence tomography; IOP intraocular pressure; BRVO Branch retinal vein occlusion; CRVO central retinal vein occlusion; CRAO central retinal artery occlusion; BRAO branch retinal artery occlusion; RT retinal tear; SB scleral buckle; PPV pars plana vitrectomy; VH Vitreous hemorrhage; PRP panretinal laser photocoagulation; IVK intravitreal kenalog; VMT vitreomacular traction; MH Macular hole;  NVD neovascularization of the disc; NVE neovascularization elsewhere; AREDS age related eye disease study; ARMD age related macular degeneration; POAG primary open angle glaucoma; EBMD epithelial/anterior basement membrane dystrophy; ACIOL anterior chamber intraocular lens; IOL intraocular lens; PCIOL posterior chamber intraocular lens; Phaco/IOL phacoemulsification with intraocular lens placement; PRK photorefractive keratectomy; LASIK laser assisted in situ keratomileusis; HTN hypertension; DM diabetes mellitus; COPD chronic obstructive pulmonary disease

## 2022-10-05 ENCOUNTER — Encounter (INDEPENDENT_AMBULATORY_CARE_PROVIDER_SITE_OTHER): Payer: Self-pay | Admitting: Ophthalmology

## 2022-10-05 ENCOUNTER — Ambulatory Visit (INDEPENDENT_AMBULATORY_CARE_PROVIDER_SITE_OTHER): Payer: Medicare Other | Admitting: Ophthalmology

## 2022-10-05 DIAGNOSIS — H35373 Puckering of macula, bilateral: Secondary | ICD-10-CM | POA: Diagnosis not present

## 2022-10-05 DIAGNOSIS — H43811 Vitreous degeneration, right eye: Secondary | ICD-10-CM

## 2022-10-05 DIAGNOSIS — I10 Essential (primary) hypertension: Secondary | ICD-10-CM | POA: Diagnosis not present

## 2022-10-05 DIAGNOSIS — H35033 Hypertensive retinopathy, bilateral: Secondary | ICD-10-CM

## 2022-10-05 DIAGNOSIS — Z961 Presence of intraocular lens: Secondary | ICD-10-CM

## 2022-10-05 DIAGNOSIS — H35352 Cystoid macular degeneration, left eye: Secondary | ICD-10-CM | POA: Diagnosis not present

## 2022-10-05 DIAGNOSIS — Z8669 Personal history of other diseases of the nervous system and sense organs: Secondary | ICD-10-CM | POA: Diagnosis not present

## 2022-10-05 DIAGNOSIS — H401130 Primary open-angle glaucoma, bilateral, stage unspecified: Secondary | ICD-10-CM

## 2022-10-05 DIAGNOSIS — H04123 Dry eye syndrome of bilateral lacrimal glands: Secondary | ICD-10-CM

## 2022-10-07 ENCOUNTER — Encounter (INDEPENDENT_AMBULATORY_CARE_PROVIDER_SITE_OTHER): Payer: Self-pay | Admitting: Ophthalmology

## 2022-10-25 ENCOUNTER — Other Ambulatory Visit: Payer: Self-pay | Admitting: Family Medicine

## 2022-10-25 DIAGNOSIS — M47816 Spondylosis without myelopathy or radiculopathy, lumbar region: Secondary | ICD-10-CM

## 2022-10-25 MED ORDER — HYDROCODONE-ACETAMINOPHEN 5-325 MG PO TABS
1.0000 | ORAL_TABLET | Freq: Four times a day (QID) | ORAL | 0 refills | Status: AC | PRN
Start: 2022-10-25 — End: ?

## 2022-12-10 ENCOUNTER — Other Ambulatory Visit: Payer: Self-pay | Admitting: Family Medicine

## 2022-12-13 NOTE — Telephone Encounter (Signed)
Appointment 12/27/22 Requested Prescriptions  Pending Prescriptions Disp Refills   ramipril (ALTACE) 10 MG capsule [Pharmacy Med Name: Ramipril 10 MG Oral Capsule] 90 capsule 0    Sig: TAKE 1 CAPSULE BY MOUTH DAILY     Cardiovascular:  ACE Inhibitors Failed - 12/10/2022 10:06 PM      Failed - Cr in normal range and within 180 days    Creatinine, Ser  Date Value Ref Range Status  12/24/2021 1.07 0.76 - 1.27 mg/dL Final         Failed - K in normal range and within 180 days    Potassium  Date Value Ref Range Status  12/24/2021 5.2 3.5 - 5.2 mmol/L Final         Failed - Valid encounter within last 6 months    Recent Outpatient Visits           9 months ago Subacute maxillary sinusitis   [facility] Malva Limes, MD   11 months ago Annual physical exam   [facility] Malva Limes, MD   1 year ago Sinusitis, unspecified chronicity, unspecified location   [facility] Malva Limes, MD   1 year ago Prediabetes   [facility] Malva Limes, MD   1 year ago Prediabetes   [facility] Malva Limes, MD       Future Appointments             In 2 weeks Sherrie Mustache, Demetrios Isaacs, MD [facility]            Passed - Patient is not pregnant      Passed - Last BP in normal range    BP Readings from Last 1 Encounters:  03/15/22 127/80

## 2022-12-27 ENCOUNTER — Ambulatory Visit (INDEPENDENT_AMBULATORY_CARE_PROVIDER_SITE_OTHER): Payer: Medicare Other | Admitting: Family Medicine

## 2022-12-27 VITALS — BP 153/72 | HR 60 | Ht 68.0 in | Wt 185.3 lb

## 2022-12-27 DIAGNOSIS — Z0000 Encounter for general adult medical examination without abnormal findings: Secondary | ICD-10-CM

## 2022-12-27 DIAGNOSIS — R7303 Prediabetes: Secondary | ICD-10-CM

## 2022-12-27 DIAGNOSIS — G4700 Insomnia, unspecified: Secondary | ICD-10-CM

## 2022-12-27 DIAGNOSIS — Z125 Encounter for screening for malignant neoplasm of prostate: Secondary | ICD-10-CM

## 2022-12-27 DIAGNOSIS — I10 Essential (primary) hypertension: Secondary | ICD-10-CM

## 2022-12-27 DIAGNOSIS — E785 Hyperlipidemia, unspecified: Secondary | ICD-10-CM

## 2022-12-27 DIAGNOSIS — F40243 Fear of flying: Secondary | ICD-10-CM

## 2022-12-27 MED ORDER — LORAZEPAM 1 MG PO TABS: 1.0000 mg | ORAL_TABLET | Freq: Three times a day (TID) | ORAL | 1 refills | Status: AC | PRN

## 2022-12-27 MED ORDER — ZOLPIDEM TARTRATE 10 MG PO TABS: ORAL_TABLET | ORAL | 1 refills | Status: DC

## 2022-12-27 NOTE — Progress Notes (Signed)
Annual Wellness Visit     Patient: Garrett Green, Male    DOB: 08-09-1945, 77 y.o.   MRN: [id number] Visit Date: 12/27/2022  Today's Provider: Mila Merry, MD   Chief Complaint  Patient presents with   Annual Exam   Subjective    Garrett Green is a 77 y.o. male who presents today for his Annual Wellness Visit.   Medications: Outpatient Medications Prior to Visit  Medication Sig   amLODipine (NORVASC) 2.5 MG tablet Take 1 tablet (2.5 mg total) by mouth daily.   aspirin 325 MG tablet Take 325 mg by mouth daily.   atorvastatin (LIPITOR) 40 MG tablet Take 1 tablet (40 mg total) by mouth daily.   Azelastine HCl 137 MCG/SPRAY SOLN PLACE 2 SPRAYS INTO BOTH NOSTRILS 2 (TWO) TIMES DAILY AS DIRECTED   Bromfenac Sodium (PROLENSA) 0.07 % SOLN Place 1 drop into the left eye daily.   clopidogrel (PLAVIX) 75 MG tablet TAKE 1 TABLET BY MOUTH EVERY DAY   dorzolamide (TRUSOPT) 2 % ophthalmic solution Place 1 drop into the left eye daily.   fluticasone (FLONASE) 50 MCG/ACT nasal spray Place 2 sprays into both nostrils daily as needed for allergies.   HYDROcodone-acetaminophen (NORCO/VICODIN) 5-325 MG tablet Take 1 tablet by mouth every 6 (six) hours as needed.   loratadine (CLARITIN) 10 MG tablet Take 10 mg by mouth daily as needed for allergies.   prednisoLONE acetate (PRED FORTE) 1 % ophthalmic suspension INSTILL 1 DROP INTO LEFT EYE EVERY DAY   Pseudoeph-Doxylamine-DM-APAP (NYQUIL PO) Take 1 Dose by mouth at bedtime as needed (congestion).   ramipril (ALTACE) 10 MG capsule TAKE 1 CAPSULE BY MOUTH DAILY   [DISCONTINUED] zolpidem (AMBIEN) 10 MG tablet TAKE ONE-HALF TO ONE TABLET BY MOUTH AT BEDTIME AS NEEDED FOR SLEEP   [DISCONTINUED] brimonidine (ALPHAGAN P) 0.1 % SOLN Place 1 drop into both eyes 2 (two) times daily. (Patient not taking: Reported on 12/27/2022)   No facility-administered medications prior to visit.    Allergies  Allergen Reactions   Sulfa Antibiotics Other (See  Comments)    Lethargic and hypotension   Clavulanic Acid Diarrhea    Patient Care Team: Malva Limes, MD as PCP - General (Family Medicine) Oh, Ezzard Standing, MD (Inactive) as Consulting Physician (Gastroenterology) Carmela Rima, MD as Consulting Physician (Ophthalmology) Micki Riley, MD as Consulting Physician (Neurology) Ihor Austin, NP as Nurse Practitioner (Neurology) Julieanne Cotton, MD as Consulting Physician (Interventional Radiology) Willeen Niece, MD (Dermatology)     Objective     Most recent functional status assessment:     No data to display         Most recent fall risk assessment:    06/23/2021    8:15 AM  Fall Risk   Falls in the past year? 0  Number falls in past yr: 0  Injury with Fall? 0  Risk for fall due to : No Fall Risks  Follow up Falls evaluation completed    Most recent depression screenings:    06/23/2021    8:14 AM 12/23/2020    8:57 AM  PHQ 2/9 Scores  PHQ - 2 Score 0 0  PHQ- 9 Score 2 3   Most recent cognitive screening:     No data to display         Most recent Audit-C alcohol use screening    06/23/2021    8:15 AM  Alcohol Use Disorder Test (AUDIT)  1. How often do you have  a drink containing alcohol? 2  2. How many drinks containing alcohol do you have on a typical day when you are drinking? 0  3. How often do you have six or more drinks on one occasion? 0  AUDIT-C Score 2   A score of 3 or more in women, and 4 or more in men indicates increased risk for alcohol abuse, EXCEPT if all of the points are from question 1   No results found for any visits on 12/27/22.  Assessment & Plan     Annual wellness visit done today including the all of the following: Reviewed patient's Family Medical History Reviewed and updated list of patient's medical providers Assessment of cognitive impairment was done Assessed patient's functional ability Established a written schedule for health screening services Health Risk  Assessent Completed and Reviewed  Exercise Activities and Dietary recommendations  Goals   None     Immunization History  Administered Date(s) Administered   Fluad Quad(high Dose 65+) 12/11/2018, 04/08/2020   Influenza, High Dose Seasonal PF 11/30/2016, 02/12/2018   Pneumococcal Conjugate-13 10/23/2013   Pneumococcal Polysaccharide-23 10/27/2014   Td 11/26/2015   Tdap 06/20/2005   Zoster, Live 09/02/2009    Health Maintenance  Topic Date Due   COVID-19 Vaccine (1) Never done   Zoster Vaccines- Shingrix (1 of 2) 03/22/1967   INFLUENZA VACCINE  11/10/2022   Medicare Annual Wellness (AWV)  12/25/2022   DTaP/Tdap/Td (3 - Td or Tdap) 11/25/2025   Colonoscopy  03/16/2026   Pneumonia Vaccine 38+ Years old  Completed   Hepatitis C Screening  Completed   HPV VACCINES  Aged Out     Discussed health benefits of physical activity, and encouraged him to engage in regular exercise appropriate for his age and condition.    He declined recommended flu vaccine.         Mila Merry, MD  [facility] [phone] (phone) [phone] (fax)  [facility]

## 2022-12-27 NOTE — Progress Notes (Signed)
Complete physical exam   Patient: Garrett Green   DOB: March 05, 1946   77 y.o. Male  MRN: [id number] Visit Date: 12/27/2022  Today's healthcare provider: Mila Merry, MD   Chief Complaint  Patient presents with   Annual Exam   Subjective    Discussed the use of AI scribe software for clinical note transcription with the patient, who gave verbal consent to proceed.  History of Present Illness   Garrett Green presents for a routine physical. He reports feeling generally well, with the exception of persistent knee pain and leg aches. he has been taking hydrococone/apap occasionally for years which remains effective. He has been using Azelastine nasal spray, which he reports has been effective. He has not required a refill of this medication recently.  He monitors his blood pressure at home, which occasionally reaches the 150s/40s, but often lowers to the 130s/60s. He has not experienced any significant symptoms such as chest pain, heart flutters, or breathing difficulties.  He reports frequent use of lorazepam to take when he flies due to fear of flying, and he requests a refill for. He also requests for zolpidem for insomnia which he reports being effective and well tolerated.    He continues on atorvastatin for hyperlipidemia which he is tolerating well. Taking ramipril and amlodipine consistently for hypertension and tries to heat healthy and exercise   He has not experienced any unusual hearing changes or tinnitus. However, he reports ongoing issues with his eyes, including increased pressure and retinal issues in the left eye, which have been previously addressed by his eye doctor.  He has not received a flu shot this year and expresses hesitation due to concerns about COVID-19. He also mentions a need for a lab order and a potential refill of a medication, the name of which is not specified in the conversation. He expresses concerns about fluctuating medication prices at  different pharmacies.       Past Medical History:  Diagnosis Date   COVID-19 07/2019   History of blood transfusion 2014   "related to back OR"   History of chicken pox    Hypertensive retinopathy    OU   Retinal detachment    OS   Stroke (HCC)    TIA (transient ischemic attack) 10/17/2017; 10/19/2017   Past Surgical History:  Procedure Laterality Date   BACK SURGERY     CATARACT EXTRACTION Right 05/2019   Dr. Vonna Kotyk   CATARACT EXTRACTION Left 2017   CATARACT EXTRACTION W/ INTRAOCULAR LENS IMPLANT Left    COLONOSCOPY WITH PROPOFOL N/A 03/16/2016   Procedure: COLONOSCOPY WITH PROPOFOL;  Surgeon: Scot Jun, MD;  Location: [facility];  Service: Endoscopy;  Laterality: N/A;   ESOPHAGOGASTRODUODENOSCOPY (EGD) WITH ESOPHAGEAL DILATION  1990s   EYE SURGERY Bilateral    Cat Sx   IR ANGIO INTRA EXTRACRAN SEL COM CAROTID INNOMINATE BILAT MOD SED  10/31/2017   IR ANGIO INTRA EXTRACRAN SEL COM CAROTID INNOMINATE BILAT MOD SED  01/15/2019   IR ANGIO INTRA EXTRACRAN SEL COM CAROTID INNOMINATE BILAT MOD SED  09/27/2019   IR ANGIO INTRA EXTRACRAN SEL COM CAROTID INNOMINATE BILAT MOD SED  10/29/2021   IR ANGIO VERTEBRAL SEL VERTEBRAL BILAT MOD SED  10/31/2017   IR ANGIO VERTEBRAL SEL VERTEBRAL BILAT MOD SED  01/15/2019   IR ANGIO VERTEBRAL SEL VERTEBRAL BILAT MOD SED  09/27/2019   IR ANGIO VERTEBRAL SEL VERTEBRAL BILAT MOD SED  10/29/2021   IR RADIOLOGIST EVAL & MGMT  10/20/2021   IR US GUIDE VASC ACCESS RIGHT  01/15/2019   IR US GUIDE VASC ACCESS RIGHT  09/27/2019   IR US GUIDE VASC ACCESS RIGHT  10/29/2021   LUMBAR LAMINECTOMY/DECOMPRESSION MICRODISCECTOMY Left 07/26/2012   Procedure: LUMBAR LAMINECTOMY/DECOMPRESSION MICRODISCECTOMY 1 LEVEL;  Surgeon: Tia Alert, MD;  Location: [facility];  Service: Neurosurgery;  Laterality: Left;  lumbar five sacral one   RETINAL DETACHMENT SURGERY Left 2016   Dr. Allena Katz   SHOULDER ARTHROSCOPY WITH OPEN ROTATOR CUFF REPAIR Left 1990s   TONSILLECTOMY      YAG LASER APPLICATION Left 08/2019   Dr. Vonna Kotyk   Social History   Socioeconomic History   Marital status: Married    Spouse name: Not on file   Number of children: Not on file   Years of education: Not on file   Highest education level: Not on file  Occupational History    Employer: RETIRED  Tobacco Use   Smoking status: Former    Current packs/day: 0.00    Average packs/day: 1 pack/day for 25.0 years (25.0 ttl pk-yrs)    Types: Cigarettes    Start date: 03/04/1963    Quit date: 03/03/1988    Years since quitting: 34.8   Smokeless tobacco: Never  Vaping Use   Vaping status: Never Used  Substance and Sexual Activity   Alcohol use: Yes    Alcohol/week: 1.0 standard drink of alcohol    Types: 1 Standard drinks or equivalent per week   Drug use: Never   Sexual activity: Yes  Other Topics Concern   Not on file  Social History Narrative   Not on file   Social Determinants of Health   Financial Resource Strain: Not on file  Food Insecurity: Not on file  Transportation Needs: Not on file  Physical Activity: Not on file  Stress: Not on file  Social Connections: Not on file  Intimate Partner Violence: Not on file   Family Status  Relation Name Status   Mother  Deceased   Father  Deceased   Sister  Deceased   Brother  Deceased   Sister  Deceased   Sister  Deceased   Brother  Alive   Sister  Alive   Neg Hx  (Not Specified)  No partnership data on file   Family History  Problem Relation Age of Onset   Heart disease Mother    Cancer Sister    Dementia Brother    Dementia Sister    Diabetes Brother    Diabetes Sister    Colon cancer Neg Hx    Prostate cancer Neg Hx    Allergies  Allergen Reactions   Sulfa Antibiotics Other (See Comments)    Lethargic and hypotension   Clavulanic Acid Diarrhea    Patient Care Team: Malva Limes, MD as PCP - General (Family Medicine) Oh, Ezzard Standing, MD (Inactive) as Consulting Physician (Gastroenterology) Carmela Rima, MD as Consulting Physician (Ophthalmology) Micki Riley, MD as Consulting Physician (Neurology) Ihor Austin, NP as Nurse Practitioner (Neurology) Julieanne Cotton, MD as Consulting Physician (Interventional Radiology) Willeen Niece, MD (Dermatology)   Medications: Outpatient Medications Prior to Visit  Medication Sig   amLODipine (NORVASC) 2.5 MG tablet Take 1 tablet (2.5 mg total) by mouth daily.   aspirin 325 MG tablet Take 325 mg by mouth daily.   atorvastatin (LIPITOR) 40 MG tablet Take 1 tablet (40 mg total) by mouth daily.   Azelastine HCl 137 MCG/SPRAY SOLN PLACE 2 SPRAYS INTO BOTH  NOSTRILS 2 (TWO) TIMES DAILY AS DIRECTED   Bromfenac Sodium (PROLENSA) 0.07 % SOLN Place 1 drop into the left eye daily.   clopidogrel (PLAVIX) 75 MG tablet TAKE 1 TABLET BY MOUTH EVERY DAY   dorzolamide (TRUSOPT) 2 % ophthalmic solution Place 1 drop into the left eye daily.   fluticasone (FLONASE) 50 MCG/ACT nasal spray Place 2 sprays into both nostrils daily as needed for allergies.   HYDROcodone-acetaminophen (NORCO/VICODIN) 5-325 MG tablet Take 1 tablet by mouth every 6 (six) hours as needed.   loratadine (CLARITIN) 10 MG tablet Take 10 mg by mouth daily as needed for allergies.   prednisoLONE acetate (PRED FORTE) 1 % ophthalmic suspension INSTILL 1 DROP INTO LEFT EYE EVERY DAY   Pseudoeph-Doxylamine-DM-APAP (NYQUIL PO) Take 1 Dose by mouth at bedtime as needed (congestion).   ramipril (ALTACE) 10 MG capsule TAKE 1 CAPSULE BY MOUTH DAILY    zolpidem (AMBIEN) 10 MG tablet TAKE ONE-HALF TO ONE TABLET BY MOUTH AT BEDTIME AS NEEDED FOR SLEEP   [DISCONTINUED] brimonidine (ALPHAGAN P) 0.1 % SOLN Place 1 drop into both eyes 2 (two) times daily. (Patient not taking: Reported on 12/27/2022)   No facility-administered medications prior to visit.    Review of Systems  Constitutional:  Negative for chills, diaphoresis and fever.  HENT:  Negative for congestion, ear discharge, ear pain, hearing  loss, nosebleeds, sore throat and tinnitus.   Eyes:  Negative for photophobia, pain, discharge and redness.  Respiratory:  Negative for cough, shortness of breath, wheezing and stridor.   Cardiovascular:  Negative for chest pain, palpitations and leg swelling.  Gastrointestinal:  Negative for abdominal pain, blood in stool, constipation, diarrhea, nausea and vomiting.  Endocrine: Negative for polydipsia.  Genitourinary:  Negative for dysuria, flank pain, frequency, hematuria and urgency.  Musculoskeletal:  Negative for back pain, myalgias and neck pain.  Skin:  Negative for rash.  Allergic/Immunologic: Negative for environmental allergies.  Neurological:  Negative for dizziness, tremors, seizures, weakness and headaches.  Hematological:  Does not bruise/bleed easily.  Psychiatric/Behavioral:  Negative for hallucinations and suicidal ideas. The patient is not nervous/anxious.       Objective    BP (!) 153/72 (BP Location: Left Arm, Patient Position: Sitting, Cuff Size: Normal)   Pulse 60   Ht 5\' 8"  (1.727 m)   Wt 185 lb 4.8 oz (84.1 kg)   SpO2 98%   BMI 28.17 kg/m    Physical Exam   General Appearance:    Well developed, well nourished male. Alert, cooperative, in no acute distress, appears stated age  Head:    Normocephalic, without obvious abnormality, atraumatic  Eyes:    PERRL, conjunctiva/corneas clear, EOM's intact, fundi    benign, both eyes       Ears:    Normal TM's and external ear canals, both ears  Nose:   Nares normal, septum midline, mucosa normal, no drainage   or sinus tenderness  Throat:   Lips, mucosa, and tongue normal; teeth and gums normal  Neck:   Supple, symmetrical, trachea midline, no adenopathy;       thyroid:  No enlargement/tenderness/nodules; no carotid   bruit or JVD  Back:     Symmetric, no curvature, ROM normal, no CVA tenderness  Lungs:     Clear to auscultation bilaterally, respirations unlabored  Chest wall:    No tenderness or deformity   Heart:    Normal heart rate. Normal rhythm. No murmurs, rubs, or gallops.  S1 and S2 normal  Abdomen:     Soft, non-tender, bowel sounds active all four quadrants,    no masses, no organomegaly  Genitalia:    deferred  Rectal:    deferred  Extremities:   All extremities are intact. No cyanosis or edema  Pulses:   2+ and symmetric all extremities  Skin:   Skin color, texture, turgor normal, no rashes or lesions  Lymph nodes:   Cervical, supraclavicular, and axillary nodes normal  Neurologic:   CNII-XII intact. Normal strength, sensation and reflexes      throughout    No results found for any visits on 12/27/22.  Assessment & Plan    Routine Health Maintenance and Physical Exam  Exercise Activities and Dietary recommendations  Goals   None     Immunization History  Administered Date(s) Administered   Fluad Quad(high Dose 65+) 12/11/2018, 04/08/2020   Influenza, High Dose Seasonal PF 11/30/2016, 02/12/2018   Pneumococcal Conjugate-13 10/23/2013   Pneumococcal Polysaccharide-23 10/27/2014   Td 11/26/2015   Tdap 06/20/2005   Zoster, Live 09/02/2009    Health Maintenance  Topic Date Due   COVID-19 Vaccine (1) Never done   Zoster Vaccines- Shingrix (1 of 2) 03/22/1967   INFLUENZA VACCINE  11/10/2022   Medicare Annual Wellness (AWV)  12/25/2022   DTaP/Tdap/Td (3 - Td or Tdap) 11/25/2025   Colonoscopy  03/16/2026   Pneumonia Vaccine 32+ Years old  Completed   Hepatitis C Screening  Completed   HPV VACCINES  Aged Out    Discussed health benefits of physical activity, and encouraged him to engage in regular exercise appropriate for his age and condition.   2. Prostate cancer screening  - PSA  3. Essential (primary) hypertension .Fairly well controlled by home BP readings. Continue current medications.   - Magnesium  4. Hyperlipidemia, unspecified hyperlipidemia type He is tolerating atorvastatin well with no adverse effects.   - CBC - Comprehensive metabolic  panel - Lipid panel  5. Insomnia, unspecified type refill zolpidem (AMBIEN) 10 MG tablet; TAKE ONE-HALF TO ONE TABLET BY MOUTH AT BEDTIME AS NEEDED FOR SLEEP  Dispense: 90 tablet; Refill: 1  6. Prediabetes  - Hemoglobin A1c  7. Fear of flying refill LORazepam (ATIVAN) 1 MG tablet; Take 1 tablet (1 mg total) by mouth 3 (three) times daily as needed.  Dispense: 20 tablet; Refill: 1        Mila Merry, MD  [facility] [phone] (phone) [phone] (fax)  [facility]

## 2022-12-31 ENCOUNTER — Other Ambulatory Visit (INDEPENDENT_AMBULATORY_CARE_PROVIDER_SITE_OTHER): Payer: Self-pay | Admitting: Ophthalmology

## 2023-01-03 ENCOUNTER — Other Ambulatory Visit (HOSPITAL_COMMUNITY): Payer: Self-pay | Admitting: Interventional Radiology

## 2023-01-03 DIAGNOSIS — I771 Stricture of artery: Secondary | ICD-10-CM

## 2023-01-10 ENCOUNTER — Other Ambulatory Visit: Payer: Self-pay | Admitting: Family Medicine

## 2023-01-10 DIAGNOSIS — M47816 Spondylosis without myelopathy or radiculopathy, lumbar region: Secondary | ICD-10-CM

## 2023-01-10 MED ORDER — HYDROCODONE-ACETAMINOPHEN 5-325 MG PO TABS: 1.0000 | ORAL_TABLET | Freq: Four times a day (QID) | ORAL | 0 refills | Status: DC | PRN

## 2023-01-11 ENCOUNTER — Other Ambulatory Visit: Payer: Self-pay | Admitting: Family Medicine

## 2023-01-11 DIAGNOSIS — I10 Essential (primary) hypertension: Secondary | ICD-10-CM

## 2023-01-12 ENCOUNTER — Other Ambulatory Visit: Payer: Self-pay | Admitting: Family Medicine

## 2023-01-12 DIAGNOSIS — M47816 Spondylosis without myelopathy or radiculopathy, lumbar region: Secondary | ICD-10-CM

## 2023-01-12 NOTE — Telephone Encounter (Signed)
[facility] in stated [facility] [place] [facility] [address] [facility] not [facility] HYDROcodone-acetaminophen (NORCO/VICODIN) 5-325 MG tablet and that is where the prescription was sent. They are ot able to send to the pharmacy that has it in stock. A [facility]  [id number] [place], [place] - [address] Phone: [phone]  Fax: [phone]

## 2023-01-12 NOTE — Telephone Encounter (Signed)
Requested medication (s) are due for refill today: No  Requested medication (s) are on the active medication list: Yes  Last refill:  01/10/23  Future visit scheduled: Yes  Notes to clinic:  Not delegated.    Requested Prescriptions  Pending Prescriptions Disp Refills   HYDROcodone-acetaminophen (NORCO/VICODIN) 5-325 MG tablet 120 tablet 0    Sig: Take 1 tablet by mouth every 6 (six) hours as needed.     Not Delegated - Analgesics:  Opioid Agonist Combinations Failed - 01/12/2023  8:49 AM      Failed - This refill cannot be delegated      Failed - Urine Drug Screen completed in last 360 days      Passed - Valid encounter within last 3 months    Recent Outpatient Visits           2 weeks ago Annual physical exam   [facility] Malva Limes, MD   10 months ago Subacute maxillary sinusitis   [place] [facility] Malva Limes, MD   1 year ago Annual physical exam   [place] [facility] Malva Limes, MD   1 year ago Sinusitis, unspecified chronicity, unspecified location   [facility] Malva Limes, MD   1 year ago Prediabetes   [facility] Malva Limes, MD       Future Appointments             In 1 year Fisher, Demetrios Isaacs, MD [facility]

## 2023-01-12 NOTE — Telephone Encounter (Signed)
Requested Prescriptions  Pending Prescriptions Disp Refills   amLODipine (NORVASC) 2.5 MG tablet [Pharmacy Med Name: amLODIPine Besylate 2.5 MG Oral Tablet] 90 tablet 1    Sig: TAKE 1 TABLET BY MOUTH DAILY     Cardiovascular: Calcium Channel Blockers 2 Failed - 01/11/2023 10:02 PM      Failed - Last BP in normal range    BP Readings from Last 1 Encounters:  12/27/22 (!) 153/72         Passed - Last Heart Rate in normal range    Pulse Readings from Last 1 Encounters:  12/27/22 60         Passed - Valid encounter within last 6 months    Recent Outpatient Visits           2 weeks ago Annual physical exam   [facility] Malva Limes, MD   10 months ago Subacute maxillary sinusitis   [place] [facility] Malva Limes, MD   1 year ago Annual physical exam   [facility] Malva Limes, MD   1 year ago Sinusitis, unspecified chronicity, unspecified location   [place] [facility] Malva Limes, MD   1 year ago Prediabetes   [facility] Malva Limes, MD       Future Appointments             In 1 year Fisher, Demetrios Isaacs, MD [facility]

## 2023-01-12 NOTE — Telephone Encounter (Signed)
[facility] [address] [facility] [place].

## 2023-01-13 MED ORDER — HYDROCODONE-ACETAMINOPHEN 5-325 MG PO TABS: 1.0000 | ORAL_TABLET | Freq: Four times a day (QID) | ORAL | 0 refills | Status: DC | PRN

## 2023-01-18 ENCOUNTER — Ambulatory Visit (HOSPITAL_COMMUNITY)
Admission: RE | Admit: 2023-01-18 | Discharge: 2023-01-18 | Disposition: A | Discharge status: Home / Self Care | Payer: Medicare Other | Source: Ambulatory Visit | Attending: Interventional Radiology | Admitting: Interventional Radiology

## 2023-01-18 DIAGNOSIS — I6503 Occlusion and stenosis of bilateral vertebral arteries: Secondary | ICD-10-CM | POA: Insufficient documentation

## 2023-01-18 DIAGNOSIS — I771 Stricture of artery: Secondary | ICD-10-CM | POA: Diagnosis present

## 2023-01-18 DIAGNOSIS — I6523 Occlusion and stenosis of bilateral carotid arteries: Secondary | ICD-10-CM | POA: Diagnosis not present

## 2023-01-18 MED ORDER — IOHEXOL 350 MG/ML SOLN
80.0000 mL | Freq: Once | INTRAVENOUS | Status: AC | PRN
  Administered 2023-01-18: 80 mL via INTRAVENOUS

## 2023-01-18 MED ORDER — SODIUM CHLORIDE (PF) 0.9 % IJ SOLN
INTRAMUSCULAR | Status: AC
  Filled 2023-01-18: qty 50

## 2023-01-24 ENCOUNTER — Other Ambulatory Visit: Payer: Self-pay | Admitting: Family Medicine

## 2023-01-25 NOTE — Telephone Encounter (Signed)
Requested Prescriptions  Pending Prescriptions Disp Refills   atorvastatin (LIPITOR) 40 MG tablet [Pharmacy Med Name: Atorvastatin Calcium 40 MG Oral Tablet] 90 tablet 0    Sig: TAKE 1 TABLET BY MOUTH DAILY     Cardiovascular:  Antilipid - Statins Failed - 01/24/2023 10:27 PM      Failed - Lipid Panel in normal range within the last 12 months    Cholesterol, Total  Date Value Ref Range Status  12/27/2022 135 100 - 199 mg/dL Final   LDL Chol Calc (NIH)  Date Value Ref Range Status  12/27/2022 65 0 - 99 mg/dL Final   HDL  Date Value Ref Range Status  12/27/2022 50 >39 mg/dL Final   Triglycerides  Date Value Ref Range Status  12/27/2022 107 0 - 149 mg/dL Final         Passed - Patient is not pregnant      Passed - Valid encounter within last 12 months    Recent Outpatient Visits           4 weeks ago Annual physical exam   [place] [facility] Malva Limes, MD   10 months ago Subacute maxillary sinusitis   [place] [facility] Malva Limes, MD   1 year ago Annual physical exam   [place] [facility] Malva Limes, MD   1 year ago Sinusitis, unspecified chronicity, unspecified location   [facility] Malva Limes, MD   1 year ago Prediabetes   [place] Malva Limes, MD       Future Appointments             In 12 months Fisher, Demetrios Isaacs, MD [place] [facility]             clopidogrel (PLAVIX) 75 MG tablet [Pharmacy Med Name: Clopidogrel Bisulfate 75 MG Oral Tablet] 90 tablet 0    Sig: TAKE 1 TABLET BY MOUTH DAILY     Hematology: Antiplatelets - clopidogrel Passed - 01/24/2023 10:27 PM      Passed - HCT in normal range and within 180 days    Hematocrit  Date Value Ref Range Status  12/27/2022 44.1 37.5 - 51.0 % Final         Passed - HGB in normal range and within 180 days    Hemoglobin  Date Value Ref  Range Status  12/27/2022 15.1 13.0 - 17.7 g/dL Final         Passed - PLT in normal range and within 180 days    Platelets  Date Value Ref Range Status  12/27/2022 186 150 - 450 x10E3/uL Final         Passed - Cr in normal range and within 360 days    Creatinine, Ser  Date Value Ref Range Status  12/27/2022 1.25 0.76 - 1.27 mg/dL Final         Passed - Valid encounter within last 6 months    Recent Outpatient Visits           4 weeks ago Annual physical exam   [facility] Malva Limes, MD   10 months ago Subacute maxillary sinusitis   [place] [facility] Malva Limes, MD   1 year ago Annual physical exam   [facility], [facility]   1 year ago Sinusitis, unspecified chronicity, unspecified location  [facility] Sherrie Mustache, Demetrios Isaacs, MD   1 year ago Prediabetes   [facility] Malva Limes, MD       Future Appointments             In 12 months Fisher, Demetrios Isaacs, MD [facility]

## 2023-02-01 NOTE — Telephone Encounter (Signed)
Filled via phone request MS

## 2023-02-11 ENCOUNTER — Other Ambulatory Visit: Payer: Self-pay | Admitting: Family Medicine

## 2023-02-13 NOTE — Telephone Encounter (Signed)
Last RF 12/13/22 #90 too soon  Requested Prescriptions  Refused Prescriptions Disp Refills   ramipril (ALTACE) 10 MG capsule [Pharmacy Med Name: Ramipril 10 MG Oral Capsule] 90 capsule 3    Sig: TAKE 1 CAPSULE BY MOUTH DAILY     Cardiovascular:  ACE Inhibitors Failed - 02/11/2023 10:02 PM      Failed - K in normal range and within 180 days    Potassium  Date Value Ref Range Status  12/27/2022 5.3 (H) 3.5 - 5.2 mmol/L Final         Failed - Last BP in normal range    BP Readings from Last 1 Encounters:  12/27/22 (!) 153/72         Passed - Cr in normal range and within 180 days    Creatinine, Ser  Date Value Ref Range Status  12/27/2022 1.25 0.76 - 1.27 mg/dL Final         Passed - Patient is not pregnant      Passed - Valid encounter within last 6 months    Recent Outpatient Visits           1 month ago Annual physical exam   [employer] [facility] Malva Limes, MD   11 months ago Subacute maxillary sinusitis   [place] [facility] Malva Limes, MD   1 year ago Annual physical exam   [facility] Malva Limes, MD   1 year ago Sinusitis, unspecified chronicity, unspecified location   [facility] Malva Limes, MD   1 year ago Prediabetes   [facility] Malva Limes, MD       Future Appointments             In 10 months Fisher, Demetrios Isaacs, MD [facility]

## 2023-02-15 ENCOUNTER — Telehealth (HOSPITAL_COMMUNITY): Payer: Self-pay

## 2023-02-15 NOTE — Telephone Encounter (Signed)
Pt agreed to f/u in 1 year with a cta head and neck. AB

## 2023-02-21 NOTE — Progress Notes (Signed)
[facility] - Clinic Note  03/01/2023     CHIEF COMPLAINT Patient presents for Retina Follow Up  HISTORY OF PRESENT ILLNESS: Garrett Green is a 77 y.o. male who presents to the clinic today for:   HPI     Retina Follow Up   Patient presents with  Other.  In left eye.  This started years ago.  Duration of 5 months.  I, the attending physician,  performed the HPI with the patient and updated documentation appropriately.        Comments   Patient states that the vision comes and goes. He is complaining of dry eyes that water a lot. He is using Pred OS every day, Prolensa OS every day and Dorzolamide OU BID.       Last edited by Rennis Chris, MD on 03/04/2023  1:36 AM.    Pt states his eyes "water all the time", the left more so than the right, he is using PF and Prolensa once a day in the left eye and dorzolamide BID OU  Referring physician: Malva Limes, MD [address] [place],  [place] [address]  HISTORICAL INFORMATION:   Selected notes from the MEDICAL RECORD NUMBER Referred by Dr. Dion Body   CURRENT MEDICATIONS: Current Outpatient Medications (Ophthalmic Drugs)  Medication Sig   Bromfenac Sodium (PROLENSA) 0.07 % SOLN Place 1 drop into the left eye daily.   dorzolamide (TRUSOPT) 2 % ophthalmic solution Place 1 drop into the left eye daily.   prednisoLONE acetate (PRED FORTE) 1 % ophthalmic suspension INSTILL 1 DROP INTO LEFT EYE EVERY DAY   No current facility-administered medications for this visit. (Ophthalmic Drugs)   Current Outpatient Medications (Other)  Medication Sig   amLODipine (NORVASC) 2.5 MG tablet TAKE 1 TABLET BY MOUTH DAILY   aspirin 325 MG tablet Take 325 mg by mouth daily.   atorvastatin (LIPITOR) 40 MG tablet TAKE 1 TABLET BY MOUTH DAILY   Azelastine HCl 137 MCG/SPRAY SOLN PLACE 2 SPRAYS INTO BOTH NOSTRILS 2 (TWO) TIMES DAILY AS DIRECTED   clopidogrel (PLAVIX) 75 MG tablet TAKE 1 TABLET BY MOUTH DAILY    fluticasone (FLONASE) 50 MCG/ACT nasal spray Place 2 sprays into both nostrils daily as needed for allergies.   HYDROcodone-acetaminophen (NORCO/VICODIN) 5-325 MG tablet Take 1 tablet by mouth every 6 (six) hours as needed.   loratadine (CLARITIN) 10 MG tablet Take 10 mg by mouth daily as needed for allergies.   LORazepam (ATIVAN) 1 MG tablet Take 1 tablet (1 mg total) by mouth 3 (three) times daily as needed.   Pseudoeph-Doxylamine-DM-APAP (NYQUIL PO) Take 1 Dose by mouth at bedtime as needed (congestion).   ramipril (ALTACE) 10 MG capsule TAKE 1 CAPSULE BY MOUTH DAILY   zolpidem (AMBIEN) 10 MG tablet TAKE ONE-HALF TO ONE TABLET BY MOUTH AT BEDTIME AS NEEDED FOR SLEEP   No current facility-administered medications for this visit. (Other)   REVIEW OF SYSTEMS: ROS   Positive for: Gastrointestinal, Neurological, Musculoskeletal, Cardiovascular, Eyes Negative for: Constitutional, Skin, Genitourinary, HENT, Endocrine, Respiratory, Psychiatric, Allergic/Imm, Heme/Lymph Last edited by Charlette Caffey, COT on 03/01/2023  8:17 AM.     ALLERGIES Allergies  Allergen Reactions   Sulfa Antibiotics Other (See Comments)    Lethargic and hypotension   Clavulanic Acid Diarrhea   PAST MEDICAL HISTORY Past Medical History:  Diagnosis Date   COVID-19 07/2019   History of blood transfusion 2014   "related to back OR"   History of  chicken pox    Hypertensive retinopathy    OU   Retinal detachment    OS   Stroke (HCC)    TIA (transient ischemic attack) 10/17/2017; 10/19/2017   Past Surgical History:  Procedure Laterality Date   BACK SURGERY     CATARACT EXTRACTION Right 05/2019   Dr. Vonna Kotyk   CATARACT EXTRACTION Left 2017   CATARACT EXTRACTION W/ INTRAOCULAR LENS IMPLANT Left    COLONOSCOPY WITH PROPOFOL N/A 03/16/2016   Procedure: COLONOSCOPY WITH PROPOFOL;  Surgeon: Scot Jun, MD;  Location: [facility] ENDOSCOPY;  Service: Endoscopy;  Laterality: N/A;   ESOPHAGOGASTRODUODENOSCOPY (EGD)  WITH ESOPHAGEAL DILATION  1990s   EYE SURGERY Bilateral    Cat Sx   IR ANGIO INTRA EXTRACRAN SEL COM CAROTID INNOMINATE BILAT MOD SED  10/31/2017   IR ANGIO INTRA EXTRACRAN SEL COM CAROTID INNOMINATE BILAT MOD SED  01/15/2019   IR ANGIO INTRA EXTRACRAN SEL COM CAROTID INNOMINATE BILAT MOD SED  09/27/2019   IR ANGIO INTRA EXTRACRAN SEL COM CAROTID INNOMINATE BILAT MOD SED  10/29/2021   IR ANGIO VERTEBRAL SEL VERTEBRAL BILAT MOD SED  10/31/2017   IR ANGIO VERTEBRAL SEL VERTEBRAL BILAT MOD SED  01/15/2019   IR ANGIO VERTEBRAL SEL VERTEBRAL BILAT MOD SED  09/27/2019   IR ANGIO VERTEBRAL SEL VERTEBRAL BILAT MOD SED  10/29/2021   IR RADIOLOGIST EVAL & MGMT  10/20/2021   IR US GUIDE VASC ACCESS RIGHT  01/15/2019   IR US GUIDE VASC ACCESS RIGHT  09/27/2019   IR US GUIDE VASC ACCESS RIGHT  10/29/2021   LUMBAR LAMINECTOMY/DECOMPRESSION MICRODISCECTOMY Left 07/26/2012   Procedure: LUMBAR LAMINECTOMY/DECOMPRESSION MICRODISCECTOMY 1 LEVEL;  Surgeon: Tia Alert, MD;  Location: [facility] NEURO ORS;  Service: Neurosurgery;  Laterality: Left;  lumbar five sacral one   RETINAL DETACHMENT SURGERY Left 2016   Dr. Allena Katz   SHOULDER ARTHROSCOPY WITH OPEN ROTATOR CUFF REPAIR Left 1990s   TONSILLECTOMY     YAG LASER APPLICATION Left 08/2019   Dr. Vonna Kotyk   FAMILY HISTORY Family History  Problem Relation Age of Onset   Heart disease Mother    Cancer Sister    Dementia Brother    Dementia Sister    Diabetes Brother    Diabetes Sister    Colon cancer Neg Hx    Prostate cancer Neg Hx    SOCIAL HISTORY Social History   Tobacco Use   Smoking status: Former    Current packs/day: 0.00    Average packs/day: 1 pack/day for 25.0 years (25.0 ttl pk-yrs)    Types: Cigarettes    Start date: 03/04/1963    Quit date: 03/03/1988    Years since quitting: 35.0   Smokeless tobacco: Never  Vaping Use   Vaping status: Never Used  Substance Use Topics   Alcohol use: Yes    Alcohol/week: 1.0 standard drink of alcohol    Types: 1  Standard drinks or equivalent per week   Drug use: Never       OPHTHALMIC EXAM:  Base Eye Exam     Visual Acuity (Snellen - Linear)       Right Left   Dist [place] 20/20 20/50   Dist ph [place]  NI         Tonometry (Tonopen, 8:20 AM)       Right Left   Pressure 17 18         Pupils       Dark Light Shape React APD   Right 2 1  Round Brisk None   Left 2 1 Round Brisk None         Visual Fields       Left Right    Full Full         Extraocular Movement       Right Left    Full Full         Neuro/Psych     Oriented x3: Yes   Mood/Affect: Normal         Dilation     Both eyes: 1.0% Mydriacyl, 2.5% Phenylephrine @ 8:17 AM           Slit Lamp and Fundus Exam     Slit Lamp Exam       Right Left   Lids/Lashes Dermatochalasis - upper lid Dermatochalasis - upper lid   Conjunctiva/Sclera Mild nasal Pinguecula, 1+Injection White and quiet   Cornea Mild arcus, well healed temporal cataract wounds, mild tear film debris, 1-2+ inferior Punctate epithelial erosions Mild arcus, well healed temporal cataract wounds, 2+ inferior Punctate epithelial erosions, mild tear film debris   Anterior Chamber deep and clear deep and clear   Iris Round and moderately dilated mild iridodonesis, Round and poorly dilated to 3.82mm   Lens PC IOL in good position, trace PCO PC IOL in good position with open PC   Anterior Vitreous Vitreous syneresis, Posterior vitreous detachment, vitreous condensations, Weiss ring Vitreous syneresis         Fundus Exam       Right Left   Disc Pink and Sharp 2-3+pallor, sharp rim, +PPA, +cupping, very thin superior rim   C/D Ratio 0.6 0.85   Macula Flat, Blunted foveal reflex, ERM, mild RPE mottling and clumping, No heme or edema Blunted foveal reflex, central CME - stably improved, +ERM, RPE mottling and clumping, no heme   Vessels attenuated, mild tortuosity Attenuated, mild tortuosity   Periphery Attached, No heme limited view due to  poor dilation, attached, no heme, inferior paving stone degeneration / CR atrophy           IMAGING AND PROCEDURES  Imaging and Procedures for 03/01/2023  OCT, Retina - OU - Both Eyes       Right Eye Quality was good. Central Foveal Thickness: 377. Progression has been stable. Findings include no IRF, no SRF, abnormal foveal contour, epiretinal membrane, macular pucker (ERM with mild blunted foveal contour and mild pucker -- stable).   Left Eye Quality was good. Central Foveal Thickness: 297. Progression has been stable. Findings include no IRF, no SRF, abnormal foveal contour, epiretinal membrane, macular pucker (Mild ERM with blunted foveal profile and mild pucker; trace cystic changes, no frank CME).   Notes *Images captured and stored on drive  Diagnosis / Impression:  OD: ERM with mild blunted foveal contour and mild pucker-- stable OS: Mild ERM with blunted foveal profile and mild pucker; trace cystic changes, no frank CME  Clinical management:  See below  Abbreviations: NFP - Normal foveal profile. CME - cystoid macular edema. PED - pigment epithelial detachment. IRF - intraretinal fluid. SRF - subretinal fluid. EZ - ellipsoid zone. ERM - epiretinal membrane. ORA - outer retinal atrophy. ORT - outer retinal tubulation. SRHM - subretinal hyper-reflective material. IRHM - intraretinal hyper-reflective material            ASSESSMENT/PLAN:    ICD-10-CM   1. Cystoid macular edema of left eye  H35.352 OCT, Retina - OU - Both Eyes    2. History of retinal  detachment  Z86.69     3. Epiretinal membrane (ERM) of both eyes  H35.373 OCT, Retina - OU - Both Eyes    4. Posterior vitreous detachment of right eye  H43.811     5. Essential hypertension  I10     6. Hypertensive retinopathy of both eyes  H35.033     7. Pseudophakia, both eyes  Z96.1     8. Primary open angle glaucoma of both eyes, unspecified glaucoma stage  H40.1130     9. Dry eyes  H04.123       1.  CME OS  - pt with history of complicated CEIOL OS w/ RD in 2016  - history of CME since 2016 previously managed by Dr. Arrie Aran   - was receiving intravitreal injections per pt report -- last visit w/ Dr. Allena Katz in January 2021  - started on PF and Prolensa QID OS here on 09.02.21  - stopped latanoprost on 12.14.21  - today, BCVA OS is stable at 20/50   - OCT shows Mild ERM with blunted foveal profile and mild pucker; trace cystic changes, no frank CME  - history of glaucoma / steroid response and was previously on timolol qdaily OU, Alphagan P BID OU, and Latanoprost qhs OU - pt reports no longer taking timolol or latanoprost per Dr. Dion Body - latanoprost switched to Vyzulta, but now stopped due to adverse rxn - today, CME remains stably resolved - IOP 18 OS - cont PF and Prolensa qdaily OS - cont dorzolamide BID OU  - f/u 6 months -- DFE, OCT  2. History of RD OS  - 2016 after cataract surgery Sherryll Burger)  - s/p PPV w/ Dr. Allena Katz in 2016  - retina attached; appears stable  - monitor  3. Epiretinal membrane, both eyes  - mild ERM OU - BCVA OD: 20/20 -- stable; OS: 20/50 -- stable - asymptomatic, no metamorphopsia - OCT shows mild ERM with interval blunting of foveal contour and mild pucker OU - no indication for surgery at this time - monitor  4. PVD / vitreous syneresis OD  - symptomatic floaters OD -- improved  - Discussed findings and prognosis  - No RT or RD on 360 peripheral exam  - Reviewed s/s of RT/RD  - Strict return precautions for any such RT/RD signs/symptoms   - monitor  5,6. Hypertensive retinopathy OU - discussed importance of tight BP control - monitor  7. Pseudophakia OU  - s/p CE/IOL (OD: Dr. Vonna Kotyk, OS: Dr. Maryelizabeth Kaufmann, 2016)  - s/p YAG OS -- [place], May 2021  - OS with iridonesis and poor dilation - monitor  8. POAG OU  - IOP 17,18 today  - currently on Dorzolamide BID OU -- cont  - history of timolol/vyzulta causing irritating/allergic  reactions   9. Dry eyes OU  - using Ivizia BID OU  - recommend increasing artificial tears and lubricating ointment as needed  Ophthalmic Meds Ordered this visit:  No orders of the defined types were placed in this encounter.    Return in about 6 months (around 08/29/2023) for f/u 6 months, CME OS, DFE, OCT.  There are no Patient Instructions on file for this visit.   This document serves as a record of services personally performed by Karie Chimera, MD, PhD. It was created on their behalf by De Blanch, an ophthalmic technician. The creation of this record is the provider's dictation and/or activities during the visit.    Electronically signed by: De Blanch,  OA, 03/04/23  1:36 AM  Karie Chimera, M.D., Ph.D. Diseases & Surgery of the Retina and Vitreous [facility]  I have reviewed the above documentation for accuracy and completeness, and I agree with the above. Karie Chimera, M.D., Ph.D. 03/04/23 1:38 AM   Abbreviations: M myopia (nearsighted); A astigmatism; H hyperopia (farsighted); P presbyopia; Mrx spectacle prescription;  CTL contact lenses; OD right eye; OS left eye; OU both eyes  XT exotropia; ET esotropia; PEK punctate epithelial keratitis; PEE punctate epithelial erosions; DES dry eye syndrome; MGD meibomian gland dysfunction; ATs artificial tears; PFAT's preservative free artificial tears; NSC nuclear sclerotic cataract; PSC posterior subcapsular cataract; ERM epi-retinal membrane; PVD posterior vitreous detachment; RD retinal detachment; DM diabetes mellitus; DR diabetic retinopathy; NPDR non-proliferative diabetic retinopathy; PDR proliferative diabetic retinopathy; CSME clinically significant macular edema; DME diabetic macular edema; dbh dot blot hemorrhages; CWS cotton wool spot; POAG primary open angle glaucoma; C/D cup-to-disc ratio; HVF humphrey visual field; GVF goldmann visual field; OCT optical coherence tomography; IOP  intraocular pressure; BRVO Branch retinal vein occlusion; CRVO central retinal vein occlusion; CRAO central retinal artery occlusion; BRAO branch retinal artery occlusion; RT retinal tear; SB scleral buckle; PPV pars plana vitrectomy; VH Vitreous hemorrhage; PRP panretinal laser photocoagulation; IVK intravitreal kenalog; VMT vitreomacular traction; MH Macular hole;  NVD neovascularization of the disc; NVE neovascularization elsewhere; AREDS age related eye disease study; ARMD age related macular degeneration; POAG primary open angle glaucoma; EBMD epithelial/anterior basement membrane dystrophy; ACIOL anterior chamber intraocular lens; IOL intraocular lens; PCIOL posterior chamber intraocular lens; Phaco/IOL phacoemulsification with intraocular lens placement; PRK photorefractive keratectomy; LASIK laser assisted in situ keratomileusis; HTN hypertension; DM diabetes mellitus; COPD chronic obstructive pulmonary disease

## 2023-03-01 ENCOUNTER — Ambulatory Visit (INDEPENDENT_AMBULATORY_CARE_PROVIDER_SITE_OTHER): Payer: Medicare Other | Admitting: Ophthalmology

## 2023-03-01 ENCOUNTER — Encounter (INDEPENDENT_AMBULATORY_CARE_PROVIDER_SITE_OTHER): Payer: Self-pay | Admitting: Ophthalmology

## 2023-03-01 DIAGNOSIS — H35373 Puckering of macula, bilateral: Secondary | ICD-10-CM | POA: Diagnosis not present

## 2023-03-01 DIAGNOSIS — H35033 Hypertensive retinopathy, bilateral: Secondary | ICD-10-CM

## 2023-03-01 DIAGNOSIS — H43811 Vitreous degeneration, right eye: Secondary | ICD-10-CM | POA: Diagnosis not present

## 2023-03-01 DIAGNOSIS — I10 Essential (primary) hypertension: Secondary | ICD-10-CM

## 2023-03-01 DIAGNOSIS — H35352 Cystoid macular degeneration, left eye: Secondary | ICD-10-CM

## 2023-03-01 DIAGNOSIS — H04123 Dry eye syndrome of bilateral lacrimal glands: Secondary | ICD-10-CM

## 2023-03-01 DIAGNOSIS — H401130 Primary open-angle glaucoma, bilateral, stage unspecified: Secondary | ICD-10-CM

## 2023-03-01 DIAGNOSIS — Z961 Presence of intraocular lens: Secondary | ICD-10-CM

## 2023-03-01 DIAGNOSIS — Z8669 Personal history of other diseases of the nervous system and sense organs: Secondary | ICD-10-CM

## 2023-03-04 ENCOUNTER — Encounter (INDEPENDENT_AMBULATORY_CARE_PROVIDER_SITE_OTHER): Payer: Self-pay | Admitting: Ophthalmology

## 2023-03-09 ENCOUNTER — Other Ambulatory Visit: Payer: Self-pay | Admitting: Family Medicine

## 2023-03-09 DIAGNOSIS — J3089 Other allergic rhinitis: Secondary | ICD-10-CM

## 2023-03-16 ENCOUNTER — Ambulatory Visit: Payer: Medicare Other | Admitting: Dermatology

## 2023-03-16 ENCOUNTER — Encounter: Payer: Medicare Other | Admitting: Dermatology

## 2023-03-16 DIAGNOSIS — L820 Inflamed seborrheic keratosis: Secondary | ICD-10-CM

## 2023-03-16 DIAGNOSIS — W908XXA Exposure to other nonionizing radiation, initial encounter: Secondary | ICD-10-CM | POA: Diagnosis not present

## 2023-03-16 DIAGNOSIS — D229 Melanocytic nevi, unspecified: Secondary | ICD-10-CM

## 2023-03-16 DIAGNOSIS — Z1283 Encounter for screening for malignant neoplasm of skin: Secondary | ICD-10-CM | POA: Diagnosis not present

## 2023-03-16 DIAGNOSIS — L821 Other seborrheic keratosis: Secondary | ICD-10-CM

## 2023-03-16 DIAGNOSIS — L814 Other melanin hyperpigmentation: Secondary | ICD-10-CM | POA: Diagnosis not present

## 2023-03-16 DIAGNOSIS — D1801 Hemangioma of skin and subcutaneous tissue: Secondary | ICD-10-CM

## 2023-03-16 DIAGNOSIS — L570 Actinic keratosis: Secondary | ICD-10-CM | POA: Diagnosis not present

## 2023-03-16 DIAGNOSIS — L578 Other skin changes due to chronic exposure to nonionizing radiation: Secondary | ICD-10-CM | POA: Diagnosis not present

## 2023-03-16 NOTE — Progress Notes (Signed)
Follow-Up Visit   Subjective  Garrett Green is a 77 y.o. male who presents for the following: Skin Cancer Screening and Upper Body Skin Exam  The patient presents for Upper Body Skin Exam (UBSE) for skin cancer screening and mole check. The patient has spots, moles and lesions to be evaluated, some may be new or changing and the patient may have concern these could be cancer.    The following portions of the chart were reviewed this encounter and updated as appropriate: medications, allergies, medical history  Review of Systems:  No other skin or systemic complaints except as noted in HPI or Assessment and Plan.  Objective  Well appearing patient in no apparent distress; mood and affect are within normal limits.  All skin waist up examined. Relevant physical exam findings are noted in the Assessment and Plan.  R sideburn x 2, scalp x 4 (6) Erythematous thin papules/macules with gritty scale.   R top of shoulder x 1 Erythematous stuck-on, waxy papule or plaque    Assessment & Plan   AK (actinic keratosis) (6) R sideburn x 2, scalp x 4  Actinic keratoses are precancerous spots that appear secondary to cumulative UV radiation exposure/sun exposure over time. They are chronic with expected duration over 1 year. A portion of actinic keratoses will progress to squamous cell carcinoma of the skin. It is not possible to reliably predict which spots will progress to skin cancer and so treatment is recommended to prevent development of skin cancer.  Recommend daily broad spectrum sunscreen SPF 30+ to sun-exposed areas, reapply every 2 hours as needed.  Recommend staying in the shade or wearing long sleeves, sun glasses (UVA+UVB protection) and wide brim hats (4-inch brim around the entire circumference of the hat). Call for new or changing lesions.  Destruction of lesion - R sideburn x 2, scalp x 4 (6) Complexity: simple   Destruction method: cryotherapy   Informed consent:  discussed and consent obtained   Timeout:  patient name, date of birth, surgical site, and procedure verified Lesion destroyed using liquid nitrogen: Yes   Region frozen until ice ball extended beyond lesion: Yes   Outcome: patient tolerated procedure well with no complications   Post-procedure details: wound care instructions given    Inflamed seborrheic keratosis R top of shoulder x 1  Symptomatic, irritating, patient would like treated. Recommend bx if not resolved in 6 wks.    Destruction of lesion - R top of shoulder x 1 Complexity: simple   Destruction method: cryotherapy   Informed consent: discussed and consent obtained   Timeout:  patient name, date of birth, surgical site, and procedure verified Lesion destroyed using liquid nitrogen: Yes   Region frozen until ice ball extended beyond lesion: Yes   Outcome: patient tolerated procedure well with no complications   Post-procedure details: wound care instructions given    Actinic Damage - Chronic condition, secondary to cumulative UV/sun exposure - diffuse scaly erythematous macules with underlying dyspigmentation - Recommend daily broad spectrum sunscreen SPF 30+ to sun-exposed areas, reapply every 2 hours as needed.  - Staying in the shade or wearing long sleeves, sun glasses (UVA+UVB protection) and wide brim hats (4-inch brim around the entire circumference of the hat) are also recommended for sun protection.  - Call for new or changing lesions.  Lentigines, Seborrheic Keratoses, Hemangiomas - Benign normal skin lesions - Benign-appearing - Call for any changes  Melanocytic Nevi - Tan-brown and/or pink-flesh-colored symmetric macules and papules - Benign  appearing on exam today - Observation - Call clinic for new or changing moles - Recommend daily use of broad spectrum spf 30+ sunscreen to sun-exposed areas.   Skin cancer screening performed today.  Return for TBSE in 1.5 years.  Maylene Roes, CMA, am  acting as scribe for Armida Sans, MD .   Documentation: I have reviewed the above documentation for accuracy and completeness, and I agree with the above.  Armida Sans, MD

## 2023-03-16 NOTE — Patient Instructions (Signed)

## 2023-03-21 ENCOUNTER — Encounter: Payer: Self-pay | Admitting: Dermatology

## 2023-03-29 ENCOUNTER — Other Ambulatory Visit: Payer: Self-pay | Admitting: Family Medicine

## 2023-03-29 NOTE — Telephone Encounter (Signed)
Requested Prescriptions  Pending Prescriptions Disp Refills   clopidogrel (PLAVIX) 75 MG tablet [Pharmacy Med Name: Clopidogrel Bisulfate 75 MG Oral Tablet] 90 tablet 2    Sig: TAKE 1 TABLET BY MOUTH DAILY     Hematology: Antiplatelets - clopidogrel Passed - 03/29/2023 11:55 AM      Passed - HCT in normal range and within 180 days    Hematocrit  Date Value Ref Range Status  12/27/2022 44.1 37.5 - 51.0 % Final         Passed - HGB in normal range and within 180 days    Hemoglobin  Date Value Ref Range Status  12/27/2022 15.1 13.0 - 17.7 g/dL Final         Passed - PLT in normal range and within 180 days    Platelets  Date Value Ref Range Status  12/27/2022 186 150 - 450 x10E3/uL Final         Passed - Cr in normal range and within 360 days    Creatinine, Ser  Date Value Ref Range Status  12/27/2022 1.25 0.76 - 1.27 mg/dL Final         Passed - Valid encounter within last 6 months    Recent Outpatient Visits           3 months ago Annual physical exam   [facility] Malva Limes, MD   1 year ago Subacute maxillary sinusitis   [facility] Malva Limes, MD   1 year ago Annual physical exam   [facility] Malva Limes, MD   1 year ago Sinusitis, unspecified chronicity, unspecified location   [facility] Malva Limes, MD   1 year ago Prediabetes   [facility] Malva Limes, MD       Future Appointments             In 9 months Fisher, Demetrios Isaacs, MD [facility]   In 1 year Deirdre Evener, MD [place] [facility]             atorvastatin (LIPITOR) 40 MG tablet [Pharmacy Med Name: Atorvastatin Calcium 40 MG Oral Tablet] 90 tablet 2    Sig: TAKE 1 TABLET BY MOUTH DAILY     Cardiovascular:  Antilipid - Statins Failed - 03/29/2023 11:55 AM      Failed - Lipid Panel in  normal range within the last 12 months    Cholesterol, Total  Date Value Ref Range Status  12/27/2022 135 100 - 199 mg/dL Final   LDL Chol Calc (NIH)  Date Value Ref Range Status  12/27/2022 65 0 - 99 mg/dL Final   HDL  Date Value Ref Range Status  12/27/2022 50 >39 mg/dL Final   Triglycerides  Date Value Ref Range Status  12/27/2022 107 0 - 149 mg/dL Final         Passed - Patient is not pregnant      Passed - Valid encounter within last 12 months    Recent Outpatient Visits           3 months ago Annual physical exam   [facility] Malva Limes, MD   1 year ago Subacute maxillary sinusitis   [facility] Malva Limes, MD   1 year ago Annual physical exam   [facility] Sherrie Mustache,  Demetrios Isaacs, MD   1 year ago Sinusitis, unspecified chronicity, unspecified location   [facility] Malva Limes, MD   1 year ago Prediabetes   [facility] Malva Limes, MD       Future Appointments             In 9 months Fisher, Demetrios Isaacs, MD [facility]   In 1 year Deirdre Evener, MD [facility]

## 2023-04-24 ENCOUNTER — Other Ambulatory Visit: Payer: Self-pay | Admitting: Family Medicine

## 2023-04-24 DIAGNOSIS — M47816 Spondylosis without myelopathy or radiculopathy, lumbar region: Secondary | ICD-10-CM

## 2023-04-25 MED ORDER — HYDROCODONE-ACETAMINOPHEN 5-325 MG PO TABS: 1.0000 | ORAL_TABLET | Freq: Four times a day (QID) | ORAL | 0 refills | Status: DC | PRN

## 2023-05-03 ENCOUNTER — Other Ambulatory Visit: Payer: Self-pay | Admitting: Physical Medicine and Rehabilitation

## 2023-05-03 DIAGNOSIS — M5412 Radiculopathy, cervical region: Secondary | ICD-10-CM

## 2023-05-12 ENCOUNTER — Ambulatory Visit
Admission: RE | Admit: 2023-05-12 | Discharge: 2023-05-12 | Disposition: A | Discharge status: Home / Self Care | Payer: Medicare Other | Source: Ambulatory Visit | Attending: Physical Medicine and Rehabilitation | Admitting: Physical Medicine and Rehabilitation

## 2023-05-12 DIAGNOSIS — M5412 Radiculopathy, cervical region: Secondary | ICD-10-CM

## 2023-05-30 ENCOUNTER — Other Ambulatory Visit: Payer: Self-pay | Admitting: Family Medicine

## 2023-05-30 DIAGNOSIS — I10 Essential (primary) hypertension: Secondary | ICD-10-CM

## 2023-05-31 NOTE — Telephone Encounter (Signed)
 Requested Prescriptions  Pending Prescriptions Disp Refills   amLODipine (NORVASC) 2.5 MG tablet [Pharmacy Med Name: amLODIPine Besylate 2.5 MG Oral Tablet] 90 tablet 0    Sig: TAKE 1 TABLET BY MOUTH DAILY     Cardiovascular: Calcium Channel Blockers 2 Failed - 05/31/2023  2:52 PM      Failed - Last BP in normal range    BP Readings from Last 1 Encounters:  12/27/22 (!) 153/72         Passed - Last Heart Rate in normal range    Pulse Readings from Last 1 Encounters:  12/27/22 60         Passed - Valid encounter within last 6 months    Recent Outpatient Visits           5 months ago Annual physical exam   [facility] Malva Limes, MD   1 year ago Subacute maxillary sinusitis   [facility] Malva Limes, MD   1 year ago Annual physical exam   [facility] Malva Limes, MD   1 year ago Sinusitis, unspecified chronicity, unspecified location   [facility] Malva Limes, MD   1 year ago Prediabetes   [facility] Malva Limes, MD       Future Appointments             In 7 months Fisher, Demetrios Isaacs, MD [facility]   In 1 year Deirdre Evener, MD [facility]

## 2023-08-01 ENCOUNTER — Other Ambulatory Visit: Payer: Self-pay | Admitting: Family Medicine

## 2023-08-01 DIAGNOSIS — I10 Essential (primary) hypertension: Secondary | ICD-10-CM

## 2023-09-01 ENCOUNTER — Telehealth: Payer: Self-pay

## 2023-09-01 ENCOUNTER — Telehealth: Payer: Self-pay | Admitting: Family Medicine

## 2023-09-01 DIAGNOSIS — M47816 Spondylosis without myelopathy or radiculopathy, lumbar region: Secondary | ICD-10-CM

## 2023-09-01 DIAGNOSIS — G4700 Insomnia, unspecified: Secondary | ICD-10-CM

## 2023-09-01 NOTE — Telephone Encounter (Unsigned)
 Copied from CRM [id number]. Topic: Clinical - Medication Refill >> Sep 01, 2023  2:57 PM Felizardo Hotter wrote: Medication: HYDROcodone -acetaminophen  (NORCO/VICODIN) 5-325 MG tablet,   Has the patient contacted their pharmacy? Yes (Agent: If no, request that the patient contact the pharmacy for the refill. If patient does not wish to contact the pharmacy document the reason why and proceed with request.) (Agent: If yes, when and what did the pharmacy advise?)  This is the patient's preferred pharmacy:   [phone] [place] - [address] [place] [employer] [place] [place] [address] Phone: [phone] Fax: [phone]  Is this the correct pharmacy for this prescription? Yes If no, delete pharmacy and type the correct one.   Has the prescription been filled recently? Yes  Is the patient out of the medication? Yes  Has the patient been seen for an appointment in the last year OR does the patient have an upcoming appointment? Yes  Can we respond through MyChart? Yes  Agent: Please be advised that Rx refills may take up to 3 business days. We ask that you follow-up with your pharmacy.

## 2023-09-01 NOTE — Telephone Encounter (Signed)
 Copied from CRM [id number]. Topic: Clinical - Prescription Issue >> Sep 01, 2023  2:59 PM Garrett Green wrote: Reason for CRM: Pt stated he receives a hand written prescription for zolpidem  (AMBIEN ) 10 MG tablet. Please call [phone] pt when he can pick up prescription

## 2023-09-03 ENCOUNTER — Other Ambulatory Visit: Payer: Self-pay | Admitting: Family Medicine

## 2023-09-03 DIAGNOSIS — J3089 Other allergic rhinitis: Secondary | ICD-10-CM

## 2023-09-05 NOTE — Telephone Encounter (Signed)
 Requested medications are due for refill today.  yes  Requested medications are on the active medications list.  yes  Last refill. 04/25/2023 #120 0 rf  Future visit scheduled.   yes  Notes to clinic.  Refill not delegated.    Requested Prescriptions  Pending Prescriptions Disp Refills   HYDROcodone -acetaminophen  (NORCO/VICODIN) 5-325 MG tablet 120 tablet 0    Sig: Take 1 tablet by mouth every 6 (six) hours as needed.     Not Delegated - Analgesics:  Opioid Agonist Combinations Failed - 09/05/2023  2:56 PM      Failed - This refill cannot be delegated      Failed - Urine Drug Screen completed in last 360 days      Failed - Valid encounter within last 3 months    Recent Outpatient Visits   None     Future Appointments             In 4 months Fisher, Erlinda Haws, MD [facility]   In 10 months Elta Halter, MD [facility]

## 2023-09-06 ENCOUNTER — Other Ambulatory Visit: Payer: Self-pay | Admitting: Family Medicine

## 2023-09-06 DIAGNOSIS — M47816 Spondylosis without myelopathy or radiculopathy, lumbar region: Secondary | ICD-10-CM

## 2023-09-06 MED ORDER — HYDROCODONE-ACETAMINOPHEN 5-325 MG PO TABS: 1.0000 | ORAL_TABLET | Freq: Four times a day (QID) | ORAL | 0 refills | Status: DC | PRN

## 2023-09-06 MED ORDER — ZOLPIDEM TARTRATE 10 MG PO TABS: ORAL_TABLET | ORAL | 1 refills | Status: DC

## 2023-09-06 NOTE — Telephone Encounter (Signed)
 [facility] HYDR[facility] -acetaminophen  (NORCO/VICODIN) 5-325 MG tablet   Please advise

## 2023-09-06 NOTE — Telephone Encounter (Signed)
 Rx printed and left at my workstation

## 2023-09-06 NOTE — Progress Notes (Signed)
 [facility] - Clinic Note  09/13/2023     CHIEF COMPLAINT Patient presents for Retina Follow Up  HISTORY OF PRESENT ILLNESS: Garrett Green is a 78 y.o. male who presents to the clinic today for:   HPI     Retina Follow Up   In both eyes.  This started 7 months ago.  Duration of 7 months.  Since onset it is stable.  I, the attending physician,  performed the HPI with the patient and updated documentation appropriately.        Comments   7 month retina follow up CME OS pt is reporting no vision changes noticed he denies any flashes some floaters pt is taking dorz bid ou pred at bedtime os diclofenac at bedtime os       Last edited by Ronelle Coffee, MD on 09/21/2023  3:23 AM.    Pt feels like his left eye vision is not as good as it used to be, he is still taking PF and Prolensa  once a day and using Ivizia for dry eyes  Referring physician: Lamon Pillow, MD [address] [place],  [place] [address]  HISTORICAL INFORMATION:   Selected notes from the MEDICAL RECORD NUMBER Referred by Dr. Particia Bolus   CURRENT MEDICATIONS: Current Outpatient Medications (Ophthalmic Drugs)  Medication Sig   Bromfenac  Sodium (PROLENSA ) 0.07 % SOLN Place 1 drop into the left eye daily.   dorzolamide  (TRUSOPT ) 2 % ophthalmic solution Place 1 drop into the left eye daily.   prednisoLONE  acetate (PRED FORTE ) 1 % ophthalmic suspension INSTILL 1 DROP INTO LEFT EYE EVERY DAY   No current facility-administered medications for this visit. (Ophthalmic Drugs)   Current Outpatient Medications (Other)  Medication Sig   amLODipine  (NORVASC ) 2.5 MG tablet TAKE 1 TABLET BY MOUTH DAILY   aspirin  325 MG tablet Take 325 mg by mouth daily.   atorvastatin  (LIPITOR) 40 MG tablet TAKE 1 TABLET BY MOUTH DAILY   Azelastine  HCl 137 MCG/SPRAY SOLN PLACE 2 SPRAYS INTO BOTH NOSTRILS 2 (TWO) TIMES DAILY AS DIRECTED   clopidogrel  (PLAVIX ) 75 MG tablet TAKE 1 TABLET BY MOUTH DAILY    fluticasone  (FLONASE ) 50 MCG/ACT nasal spray Place 2 sprays into both nostrils daily as needed for allergies.   HYDROcodone -acetaminophen  (NORCO/VICODIN) 5-325 MG tablet Take 1 tablet by mouth every 6 (six) hours as needed.   loratadine  (CLARITIN ) 10 MG tablet Take 10 mg by mouth daily as needed for allergies.   LORazepam  (ATIVAN ) 1 MG tablet Take 1 tablet (1 mg total) by mouth 3 (three) times daily as needed.   Pseudoeph-Doxylamine-DM-APAP (NYQUIL PO) Take 1 Dose by mouth at bedtime as needed (congestion).   ramipril  (ALTACE ) 10 MG capsule TAKE 1 CAPSULE BY MOUTH DAILY   zolpidem  (AMBIEN ) 10 MG tablet TAKE ONE-HALF TO ONE TABLET BY MOUTH AT BEDTIME AS NEEDED FOR SLEEP   No current facility-administered medications for this visit. (Other)   REVIEW OF SYSTEMS: ROS   Positive for: Gastrointestinal, Neurological, Musculoskeletal, Cardiovascular, Eyes Negative for: Constitutional, Skin, Genitourinary, HENT, Endocrine, Respiratory, Psychiatric, Allergic/Imm, Heme/Lymph Last edited by Alise Appl, COT on 09/13/2023  8:41 AM.      ALLERGIES Allergies  Allergen Reactions   Sulfa Antibiotics Other (See Comments)    Lethargic and hypotension   Clavulanic Acid Diarrhea   PAST MEDICAL HISTORY Past Medical History:  Diagnosis Date   COVID-19 07/2019   History of blood transfusion 2014   related to back OR  History of chicken pox    Hypertensive retinopathy    OU   Retinal detachment    OS   Stroke (HCC)    TIA (transient ischemic attack) 10/17/2017; 10/19/2017   Past Surgical History:  Procedure Laterality Date   BACK SURGERY     CATARACT EXTRACTION Right 05/2019   Dr. Lenna Quinton   CATARACT EXTRACTION Left 2017   CATARACT EXTRACTION W/ INTRAOCULAR LENS IMPLANT Left    COLONOSCOPY WITH PROPOFOL  N/A 03/16/2016   Procedure: COLONOSCOPY WITH PROPOFOL ;  Surgeon: Cassie Click, MD;  Location: [facility] ENDOSCOPY;  Service: Endoscopy;  Laterality: N/A;   ESOPHAGOGASTRODUODENOSCOPY (EGD)  WITH ESOPHAGEAL DILATION  1990s   EYE SURGERY Bilateral    Cat Sx   IR ANGIO INTRA EXTRACRAN SEL COM CAROTID INNOMINATE BILAT MOD SED  10/31/2017   IR ANGIO INTRA EXTRACRAN SEL COM CAROTID INNOMINATE BILAT MOD SED  01/15/2019   IR ANGIO INTRA EXTRACRAN SEL COM CAROTID INNOMINATE BILAT MOD SED  09/27/2019   IR ANGIO INTRA EXTRACRAN SEL COM CAROTID INNOMINATE BILAT MOD SED  10/29/2021   IR ANGIO VERTEBRAL SEL VERTEBRAL BILAT MOD SED  10/31/2017   IR ANGIO VERTEBRAL SEL VERTEBRAL BILAT MOD SED  01/15/2019   IR ANGIO VERTEBRAL SEL VERTEBRAL BILAT MOD SED  09/27/2019   IR ANGIO VERTEBRAL SEL VERTEBRAL BILAT MOD SED  10/29/2021   IR RADIOLOGIST EVAL & MGMT  10/20/2021   IR US  GUIDE VASC ACCESS RIGHT  01/15/2019   IR US  GUIDE VASC ACCESS RIGHT  09/27/2019   IR US  GUIDE VASC ACCESS RIGHT  10/29/2021   LUMBAR LAMINECTOMY/DECOMPRESSION MICRODISCECTOMY Left 07/26/2012   Procedure: LUMBAR LAMINECTOMY/DECOMPRESSION MICRODISCECTOMY 1 LEVEL;  Surgeon: Isadora Mar, MD;  Location: [facility] NEURO ORS;  Service: Neurosurgery;  Laterality: Left;  lumbar five sacral one   RETINAL DETACHMENT SURGERY Left 2016   Dr. Lydia Sams   SHOULDER ARTHROSCOPY WITH OPEN ROTATOR CUFF REPAIR Left 1990s   TONSILLECTOMY     YAG LASER APPLICATION Left 08/2019   Dr. Lenna Quinton   FAMILY HISTORY Family History  Problem Relation Age of Onset   Heart disease Mother    Cancer Sister    Dementia Brother    Dementia Sister    Diabetes Brother    Diabetes Sister    Colon cancer Neg Hx    Prostate cancer Neg Hx    SOCIAL HISTORY Social History   Tobacco Use   Smoking status: Former    Current packs/day: 0.00    Average packs/day: 1 pack/day for 25.0 years (25.0 ttl pk-yrs)    Types: Cigarettes    Start date: 03/04/1963    Quit date: 03/03/1988    Years since quitting: 35.5   Smokeless tobacco: Never  Vaping Use   Vaping status: Never Used  Substance Use Topics   Alcohol use: Yes    Alcohol/week: 1.0 standard drink of alcohol    Types: 1  Standard drinks or equivalent per week   Drug use: Never       OPHTHALMIC EXAM:  Base Eye Exam     Visual Acuity (Snellen - Linear)       Right Left   Dist [place] 20/20 -2 20/70   Dist ph [place]  20/60 -2         Tonometry (Tonopen, 8:47 AM)       Right Left   Pressure 12 16         Pupils       Pupils Dark Light Shape React APD  Right PERRL 2 1 Round Brisk None   Left PERRL 2 1 Round Brisk None         Visual Fields       Left Right    Full Full         Extraocular Movement       Right Left    Full, Ortho Full, Ortho         Neuro/Psych     Oriented x3: Yes   Mood/Affect: Normal         Dilation     Both eyes: 2.5% Phenylephrine  @ 8:47 AM           Slit Lamp and Fundus Exam     Slit Lamp Exam       Right Left   Lids/Lashes Dermatochalasis - upper lid Dermatochalasis - upper lid   Conjunctiva/Sclera Mild nasal Pinguecula, 1+Injection White and quiet   Cornea Mild arcus, well healed temporal cataract wounds, mild tear film debris, 1-2+ inferior Punctate epithelial erosions Mild arcus, well healed temporal cataract wounds, 2+ fine Punctate epithelial erosions, mild tear film debris   Anterior Chamber deep and clear deep and clear   Iris Round and moderately dilated mild iridodonesis, Round and poorly dilated   Lens PC IOL in good position, trace PCO PC IOL in good position with open PC   Anterior Vitreous Vitreous syneresis, Posterior vitreous detachment, vitreous condensations, Weiss ring Vitreous syneresis         Fundus Exam       Right Left   Disc Pink and Sharp 2-3+pallor, sharp rim, +PPA, +cupping, very thin superior rim   C/D Ratio 0.6 0.85   Macula Flat, Blunted foveal reflex, ERM, mild RPE mottling and clumping, No heme or edema Blunted foveal reflex, central CME - stably improved, +ERM, RPE mottling and clumping, no heme   Vessels mild attenuation, mild tortuosity mild attenuation, mild tortuosity   Periphery Attached, No  heme limited view due to poor dilation, attached, no heme, inferior paving stone degeneration / CR atrophy           IMAGING AND PROCEDURES  Imaging and Procedures for 09/13/2023  OCT, Retina - OU - Both Eyes       Right Eye Quality was good. Central Foveal Thickness: 393. Progression has been stable. Findings include no IRF, no SRF, abnormal foveal contour, epiretinal membrane, macular pucker (ERM with mild blunted foveal contour and mild pucker -- stable).   Left Eye Quality was good. Central Foveal Thickness: 292. Progression has been stable. Findings include no IRF, no SRF, abnormal foveal contour, epiretinal membrane, macular pucker (Mild ERM with blunted foveal profile and mild pucker; trace cystic changes, no frank CME).   Notes *Images captured and stored on drive  Diagnosis / Impression:  OD: ERM with mild blunted foveal contour and mild pucker-- stable OS: Mild ERM with blunted foveal profile and mild pucker; trace cystic changes, no frank CME  Clinical management:  See below  Abbreviations: NFP - Normal foveal profile. CME - cystoid macular edema. PED - pigment epithelial detachment. IRF - intraretinal fluid. SRF - subretinal fluid. EZ - ellipsoid zone. ERM - epiretinal membrane. ORA - outer retinal atrophy. ORT - outer retinal tubulation. SRHM - subretinal hyper-reflective material. IRHM - intraretinal hyper-reflective material             ASSESSMENT/PLAN:    ICD-10-CM   1. Cystoid macular edema of left eye  H35.352     2. History of retinal  detachment  Z86.69     3. Epiretinal membrane (ERM) of both eyes  H35.373 OCT, Retina - OU - Both Eyes    4. Posterior vitreous detachment of right eye  H43.811     5. Essential hypertension  I10     6. Hypertensive retinopathy of both eyes  H35.033     7. Pseudophakia, both eyes  Z96.1     8. Primary open angle glaucoma of both eyes, unspecified glaucoma stage  H40.1130     9. Dry eyes  H04.123      1. CME  OS  - pt with history of complicated CEIOL OS w/ RD in 2016  - history of CME since 2016 previously managed by Dr. Axel Lent   - was receiving intravitreal injections per pt report -- last visit w/ Dr. Lydia Sams in January 2021  - started on PF and Prolensa  QID OS here on 09.02.21  - stopped latanoprost  on 12.14.21  - today, BCVA OS decreased to 20/60 from 20/50   - OCT shows Mild ERM with blunted foveal profile and mild pucker; trace cystic changes, no frank CME  - history of glaucoma / steroid response and was previously on timolol  qdaily OU, Alphagan  P BID OU, and Latanoprost  qhs OU - pt reports no longer taking timolol  or latanoprost  per Dr. Particia Bolus - latanoprost  switched to Vyzulta , but now stopped due to adverse rxn - today, CME remains stably resolved - IOP 16 OS - cont PF and Prolensa  qdaily OS -- recommend low dose maintenance - cont dorzolamide  BID OU  - f/u 6-9 months -- DFE, OCT  2. History of RD OS  - 2016 after cataract surgery [facility])  - s/p PPV w/ Dr. Lydia Sams in 2016  - retina attached; appears stable  - monitor  3. Epiretinal membrane, both eyes  - mild ERM OU - BCVA OD: 20/20 -- stable; OS: 20/60 -- decreased - asymptomatic, no metamorphopsia - OCT shows mild ERM with interval blunting of foveal contour and mild pucker OU - no indication for surgery at this time - monitor  4. PVD / vitreous syneresis OD  - symptomatic floaters OD -- improved  - Discussed findings and prognosis  - No RT or RD on 360 peripheral exam  - Reviewed s/s of RT/RD  - Strict return precautions for any such RT/RD signs/symptoms   - monitor  5,6. Hypertensive retinopathy OU - discussed importance of tight BP control - monitor  7. Pseudophakia OU  - s/p CE/IOL (OD: Dr. Lenna Quinton, OS: Dr. Romie Coast, 2016)  - s/p YAG OS -- [place], May 2021  - OS with iridonesis and poor dilation - monitor  8. POAG OU  - IOP 12,16 today  - currently on Dorzolamide  BID OU -- cont  - history of  timolol /vyzulta  causing irritating/allergic reactions   9. Dry eyes OU  - using Ivizia BID OU  - recommend increasing artificial tears and lubricating ointment as needed  Ophthalmic Meds Ordered this visit:  No orders of the defined types were placed in this encounter.    Return for f/u 6-9 months, CME OS, DFE, OCT.  There are no Patient Instructions on file for this visit.   This document serves as a record of services personally performed by Jeanice Millard, MD, PhD. It was created on their behalf by Angelia Kelp, an ophthalmic technician. The creation of this record is the provider's dictation and/or activities during the visit.    Electronically signed by: Angelia Kelp, OA,  09/21/23  3:25 AM  This document serves as a record of services personally performed by Jeanice Millard, MD, PhD. It was created on their behalf by Morley Arabia. Bevin Bucks, OA an ophthalmic technician. The creation of this record is the provider's dictation and/or activities during the visit.    Electronically signed by: Morley Arabia. Bevin Bucks, OA 09/21/23 3:25 AM   Jeanice Millard, M.D., Ph.D. Diseases & Surgery of the Retina and Vitreous [facility]  I have reviewed the above documentation for accuracy and completeness, and I agree with the above. Jeanice Millard, M.D., Ph.D. 09/21/23 3:29 AM    Abbreviations: M myopia (nearsighted); A astigmatism; H hyperopia (farsighted); P presbyopia; Mrx spectacle prescription;  CTL contact lenses; OD right eye; OS left eye; OU both eyes  XT exotropia; ET esotropia; PEK punctate epithelial keratitis; PEE punctate epithelial erosions; DES dry eye syndrome; MGD meibomian gland dysfunction; ATs artificial tears; PFAT's preservative free artificial tears; NSC nuclear sclerotic cataract; PSC posterior subcapsular cataract; ERM epi-retinal membrane; PVD posterior vitreous detachment; RD retinal detachment; DM diabetes mellitus; DR diabetic retinopathy; NPDR  non-proliferative diabetic retinopathy; PDR proliferative diabetic retinopathy; CSME clinically significant macular edema; DME diabetic macular edema; dbh dot blot hemorrhages; CWS cotton wool spot; POAG primary open angle glaucoma; C/D cup-to-disc ratio; HVF humphrey visual field; GVF goldmann visual field; OCT optical coherence tomography; IOP intraocular pressure; BRVO Branch retinal vein occlusion; CRVO central retinal vein occlusion; CRAO central retinal artery occlusion; BRAO branch retinal artery occlusion; RT retinal tear; SB scleral buckle; PPV pars plana vitrectomy; VH Vitreous hemorrhage; PRP panretinal laser photocoagulation; IVK intravitreal kenalog; VMT vitreomacular traction; MH Macular hole;  NVD neovascularization of the disc; NVE neovascularization elsewhere; AREDS age related eye disease study; ARMD age related macular degeneration; POAG primary open angle glaucoma; EBMD epithelial/anterior basement membrane dystrophy; ACIOL anterior chamber intraocular lens; IOL intraocular lens; PCIOL posterior chamber intraocular lens; Phaco/IOL phacoemulsification with intraocular lens placement; PRK photorefractive keratectomy; LASIK laser assisted in situ keratomileusis; HTN hypertension; DM diabetes mellitus; COPD chronic obstructive pulmonary disease

## 2023-09-06 NOTE — Addendum Note (Signed)
 Addended by: Lamon Pillow on: 09/06/2023 12:18 PM   Modules accepted: Orders

## 2023-09-07 ENCOUNTER — Other Ambulatory Visit (HOSPITAL_COMMUNITY): Payer: Self-pay

## 2023-09-07 ENCOUNTER — Telehealth: Payer: Self-pay

## 2023-09-07 NOTE — Telephone Encounter (Signed)
 Spoke with patient wife, stated [facility] [place] [facility]Hyrdocodone[facility] and the patient already knows the routine due to them not having it she has to [facility] another [facility] to have that medication filled. [facility] [address] [facility] call and stated they would have medication ready for him today. I called patient to clarify. I also called [facility] and they stated the reason for the PA was due to his insurance not covering for more then 7 days and it could be due to pt possibly changing insurance, when I spoke with wife she stated they have not changed insurance and stated the message above.

## 2023-09-07 NOTE — Telephone Encounter (Signed)
 Pharmacy Patient Advocate Encounter   Received notification from Onbase that prior authorization for HYDROcodone -Acetaminophen  5-325MG  tablets  is required/requested.   Insurance verification completed.   The patient is insured through [employer] .   Per test claim: PA required; PA submitted to above mentioned insurance via CoverMyMeds Key/confirmation #/EOC [id number] Status is pending

## 2023-09-07 NOTE — Telephone Encounter (Signed)
 [facility] HYDR[facility] -acetaminophen  (NORCO/VICODIN) 5-325 MG tablet stating:   Alternative requested:  PA required for more than 7 days.

## 2023-09-07 NOTE — Telephone Encounter (Signed)
 This is chronic medication he has been taking long term for years. Please contact pharmacy about PA. There is nothing in covermymeds.

## 2023-09-08 ENCOUNTER — Other Ambulatory Visit (HOSPITAL_COMMUNITY): Payer: Self-pay

## 2023-09-08 NOTE — Telephone Encounter (Signed)
 Pharmacy Patient Advocate Encounter  Received notification from [facility] that Prior Authorization for HYDROcodone -Acetaminophen  5-325MG  tablets  has been APPROVED from 09/07/23 to 10/07/23. Ran test claim, Copay is $7. This test claim was processed through [facility]- copay amounts may vary at other pharmacies due to pharmacy/plan contracts, or as the patient moves through the different stages of their insurance plan.   PA #/Case ID/Reference #:  [id number]

## 2023-09-13 ENCOUNTER — Ambulatory Visit (INDEPENDENT_AMBULATORY_CARE_PROVIDER_SITE_OTHER): Payer: Medicare Other | Admitting: Ophthalmology

## 2023-09-13 ENCOUNTER — Encounter (INDEPENDENT_AMBULATORY_CARE_PROVIDER_SITE_OTHER): Payer: Self-pay | Admitting: Ophthalmology

## 2023-09-13 DIAGNOSIS — H35352 Cystoid macular degeneration, left eye: Secondary | ICD-10-CM | POA: Diagnosis not present

## 2023-09-13 DIAGNOSIS — H35373 Puckering of macula, bilateral: Secondary | ICD-10-CM

## 2023-09-13 DIAGNOSIS — H04123 Dry eye syndrome of bilateral lacrimal glands: Secondary | ICD-10-CM

## 2023-09-13 DIAGNOSIS — I10 Essential (primary) hypertension: Secondary | ICD-10-CM

## 2023-09-13 DIAGNOSIS — H401130 Primary open-angle glaucoma, bilateral, stage unspecified: Secondary | ICD-10-CM

## 2023-09-13 DIAGNOSIS — Z8669 Personal history of other diseases of the nervous system and sense organs: Secondary | ICD-10-CM

## 2023-09-13 DIAGNOSIS — Z961 Presence of intraocular lens: Secondary | ICD-10-CM

## 2023-09-13 DIAGNOSIS — H35033 Hypertensive retinopathy, bilateral: Secondary | ICD-10-CM

## 2023-09-13 DIAGNOSIS — H43811 Vitreous degeneration, right eye: Secondary | ICD-10-CM | POA: Diagnosis not present

## 2023-09-21 ENCOUNTER — Encounter (INDEPENDENT_AMBULATORY_CARE_PROVIDER_SITE_OTHER): Payer: Self-pay | Admitting: Ophthalmology

## 2023-10-07 ENCOUNTER — Encounter (HOSPITAL_COMMUNITY): Payer: Self-pay | Admitting: Interventional Radiology

## 2023-11-13 ENCOUNTER — Other Ambulatory Visit: Payer: Self-pay | Admitting: Family Medicine

## 2023-12-07 ENCOUNTER — Other Ambulatory Visit: Payer: Self-pay | Admitting: Podiatry

## 2023-12-07 DIAGNOSIS — M25572 Pain in left ankle and joints of left foot: Secondary | ICD-10-CM

## 2023-12-07 DIAGNOSIS — S93412D Sprain of calcaneofibular ligament of left ankle, subsequent encounter: Secondary | ICD-10-CM

## 2023-12-07 DIAGNOSIS — M7672 Peroneal tendinitis, left leg: Secondary | ICD-10-CM

## 2023-12-14 ENCOUNTER — Ambulatory Visit
Admission: RE | Admit: 2023-12-14 | Discharge: 2023-12-14 | Disposition: A | Discharge status: Home / Self Care | Source: Ambulatory Visit | Attending: Podiatry | Admitting: Podiatry

## 2023-12-14 DIAGNOSIS — M7672 Peroneal tendinitis, left leg: Secondary | ICD-10-CM

## 2023-12-14 DIAGNOSIS — M25572 Pain in left ankle and joints of left foot: Secondary | ICD-10-CM

## 2023-12-14 DIAGNOSIS — S93412D Sprain of calcaneofibular ligament of left ankle, subsequent encounter: Secondary | ICD-10-CM

## 2024-01-03 ENCOUNTER — Ambulatory Visit (INDEPENDENT_AMBULATORY_CARE_PROVIDER_SITE_OTHER): Payer: Self-pay | Admitting: Family Medicine

## 2024-01-03 ENCOUNTER — Encounter: Payer: Self-pay | Admitting: Family Medicine

## 2024-01-03 VITALS — BP 134/60 | HR 55 | Resp 14 | Ht 68.0 in | Wt 177.3 lb

## 2024-01-03 DIAGNOSIS — E785 Hyperlipidemia, unspecified: Secondary | ICD-10-CM

## 2024-01-03 DIAGNOSIS — M47816 Spondylosis without myelopathy or radiculopathy, lumbar region: Secondary | ICD-10-CM

## 2024-01-03 DIAGNOSIS — R7303 Prediabetes: Secondary | ICD-10-CM | POA: Diagnosis not present

## 2024-01-03 DIAGNOSIS — I10 Essential (primary) hypertension: Secondary | ICD-10-CM

## 2024-01-03 DIAGNOSIS — I6990 Unspecified sequelae of unspecified cerebrovascular disease: Secondary | ICD-10-CM

## 2024-01-03 DIAGNOSIS — Z125 Encounter for screening for malignant neoplasm of prostate: Secondary | ICD-10-CM

## 2024-01-03 DIAGNOSIS — Z0001 Encounter for general adult medical examination with abnormal findings: Secondary | ICD-10-CM | POA: Diagnosis not present

## 2024-01-03 DIAGNOSIS — R208 Other disturbances of skin sensation: Secondary | ICD-10-CM

## 2024-01-03 DIAGNOSIS — Z0000 Encounter for general adult medical examination without abnormal findings: Secondary | ICD-10-CM

## 2024-01-03 MED ORDER — HYDROCODONE-ACETAMINOPHEN 5-325 MG PO TABS: 1.0000 | ORAL_TABLET | Freq: Four times a day (QID) | ORAL | 0 refills | Status: DC | PRN

## 2024-01-03 NOTE — Progress Notes (Signed)
 Complete physical exam   Patient: Garrett Green   DOB: 1946-03-19   78 y.o. Male  MRN: [id number] Visit Date: 01/03/2024  Today's healthcare provider: Nancyann Perry, MD   Chief Complaint  Patient presents with   Annual Exam    Sleep pattern: 7-8 hours, wakes up 1 time to use restroom Exercising: daily busy most of the time no specific regimen No concerns   Subjective    Discussed the use of AI scribe software for clinical note transcription with the patient, who gave verbal consent to proceed.  History of Present Illness   Garrett Green is a 78 year old male who presents for an annual physical exam.  He is doing well overall with no specific complaints and is fasting for his blood work today. He takes Ambien , usually not more than one tablet per night, sometimes just half a tablet, and does not need a new prescription at this time. He uses Ativan  (lorazepam ) only when flying.  He continues to take ramipril  for blood pressure management and monitors his blood pressure at home, which usually runs around 130/65 mmHg. He takes atorvastatin  for cholesterol management without issues.  He experiences pain in his feet and legs, particularly noticeable at night when lying down, but it does not worsen with walking. This has been ongoing for a couple of years without significant change. A blood test was done previously to investigate this, but no new tests have been conducted recently.  He recalls a past incident where he sprained his left ankle, which was later found to have a small fracture. An MRI was performed, but he has not followed up on the results yet.  No issues with breathing or shortness of breath, although he tires more easily than before. He remains active, walking regularly, but does not engage in structured exercise. He has a history of smoking in his thirties but has since quit. He sees an eye doctor every six months for glaucoma monitoring and had a colonoscopy in  2017. He has not received the new shingles vaccine, although he had the older version previously.       HPI     Annual Exam    Additional comments: Sleep pattern: 7-8 hours, wakes up 1 time to use restroom Exercising: daily busy most of the time no specific regimen No concerns      Last edited by Wilfred Hargis RAMAN, CMA on 01/03/2024  8:42 AM.       Past Medical History:  Diagnosis Date   COVID-19 07/2019   History of blood transfusion 2014   related to back OR   History of chicken pox    Hypertensive retinopathy    OU   Retinal detachment    OS   Stroke (HCC)    TIA (transient ischemic attack) 10/17/2017; 10/19/2017   Past Surgical History:  Procedure Laterality Date   BACK SURGERY     CATARACT EXTRACTION Right 05/2019   Dr. Lavonia   CATARACT EXTRACTION Left 2017   CATARACT EXTRACTION W/ INTRAOCULAR LENS IMPLANT Left    COLONOSCOPY WITH PROPOFOL  N/A 03/16/2016   Procedure: COLONOSCOPY WITH PROPOFOL ;  Surgeon: Lamar ONEIDA Holmes, MD;  Location: [facility] ENDOSCOPY;  Service: Endoscopy;  Laterality: N/A;   ESOPHAGOGASTRODUODENOSCOPY (EGD) WITH ESOPHAGEAL DILATION  1990s   EYE SURGERY Bilateral    Cat Sx   IR ANGIO INTRA EXTRACRAN SEL COM CAROTID INNOMINATE BILAT MOD SED  10/31/2017   IR ANGIO INTRA EXTRACRAN SEL COM CAROTID  INNOMINATE BILAT MOD SED  01/15/2019   IR ANGIO INTRA EXTRACRAN SEL COM CAROTID INNOMINATE BILAT MOD SED  09/27/2019   IR ANGIO INTRA EXTRACRAN SEL COM CAROTID INNOMINATE BILAT MOD SED  10/29/2021   IR ANGIO VERTEBRAL SEL VERTEBRAL BILAT MOD SED  10/31/2017   IR ANGIO VERTEBRAL SEL VERTEBRAL BILAT MOD SED  01/15/2019   IR ANGIO VERTEBRAL SEL VERTEBRAL BILAT MOD SED  09/27/2019   IR ANGIO VERTEBRAL SEL VERTEBRAL BILAT MOD SED  10/29/2021   IR RADIOLOGIST EVAL & MGMT  10/20/2021   IR US  GUIDE VASC ACCESS RIGHT  01/15/2019   IR US  GUIDE VASC ACCESS RIGHT  09/27/2019   IR US  GUIDE VASC ACCESS RIGHT  10/29/2021   LUMBAR LAMINECTOMY/DECOMPRESSION MICRODISCECTOMY Left  07/26/2012   Procedure: LUMBAR LAMINECTOMY/DECOMPRESSION MICRODISCECTOMY 1 LEVEL;  Surgeon: Alm GORMAN Molt, MD;  Location: [facility]UR[facility];  Service: Neurosurgery;  Laterality: Left;  lumbar five sacral one   RETINAL DETACHMENT SURGERY Left 2016   Dr. Tobie   SHOULDER ARTHROSCOPY WITH OPEN ROTATOR CUFF REPAIR Left 1990s   TONSILLECTOMY     YAG LASER APPLICATION Left 08/2019   Dr. Lavonia   Social History   Socioeconomic History   Marital status: Married    Spouse name: Not on file   Number of children: Not on file   Years of education: Not on file   Highest education level: Not on file  Occupational History    Employer: RETIRED  Tobacco Use   Smoking status: Former    Current packs/day: 0.00    Average packs/day: 1 pack/day for 25.0 years (25.0 ttl pk-yrs)    Types: Cigarettes    Start date: 03/04/1963    Quit date: 03/03/1988    Years since quitting: 35.8   Smokeless tobacco: Never  Vaping Use   Vaping status: Never Used  Substance and Sexual Activity   Alcohol use: Yes    Alcohol/week: 1.0 standard drink of alcohol    Types: 1 Standard drinks or equivalent per week   Drug use: Never   Sexual activity: Yes  Other Topics Concern   Not on file  Social History Narrative   Not on file   Social Drivers of Health   Financial Resource Strain: Low Risk  (01/01/2024)   Overall Financial Resource Strain (CARDIA)    Difficulty of Paying Living Expenses: Not hard at all  Food Insecurity: No Food Insecurity (01/01/2024)   Hunger Vital Sign    Worried About Running Out of Food in the Last Year: Never true    Ran Out of Food in the Last Year: Never true  Transportation Needs: No Transportation Needs (01/01/2024)   PRAPARE - Administrator, Civil Service (Medical): No    Lack of Transportation (Non-Medical): No  Physical Activity: Unknown (01/01/2024)   Exercise Vital Sign    Days of Exercise per Week: Patient declined    Minutes of Exercise per Session: Not on file   Stress: Patient Declined (01/01/2024)   [employer] of Occupational Health - Occupational Stress Questionnaire    Feeling of Stress: Patient declined  Social Connections: Moderately Integrated (01/01/2024)   Social Connection and Isolation Panel    Frequency of Communication with Friends and Family: Twice a week    Frequency of Social Gatherings with Friends and Family: Once a week    Attends Religious Services: More than 4 times per year    Active Member of [employer] or Organizations: No    Attends [employer]  or Organization Meetings: Not on file    Marital Status: Married  Catering Manager Violence: Not on file   Family Status  Relation Name Status   Mother  Deceased   Father  Deceased   Sister  Deceased   Brother  Deceased   Sister  Deceased   Sister  Deceased   Brother  Alive   Sister  Alive   Neg Hx  (Not Specified)  No partnership data on file   Family History  Problem Relation Age of Onset   Heart disease Mother    Cancer Sister    Dementia Brother    Dementia Sister    Diabetes Brother    Diabetes Sister    Colon cancer Neg Hx    Prostate cancer Neg Hx    Allergies  Allergen Reactions   Sulfa Antibiotics Other (See Comments)    Lethargic and hypotension   Clavulanic Acid Diarrhea    Patient Care Team: Gasper Nancyann BRAVO, MD as PCP - General (Family Medicine) Oh, Deward GRADE, MD (Inactive) as Consulting Physician (Gastroenterology) Tobie Baptist, MD as Consulting Physician (Ophthalmology) Rosemarie Eather RAMAN, MD as Consulting Physician (Neurology) Whitfield Raisin, NP as Nurse Practitioner (Neurology) Dolphus Carrion, MD as Consulting Physician (Interventional Radiology) Jackquline Sawyer, MD (Dermatology)   Medications: Outpatient Medications Prior to Visit  Medication Sig   amLODipine  (NORVASC ) 2.5 MG tablet TAKE 1 TABLET BY MOUTH DAILY   aspirin  325 MG tablet Take 325 mg by mouth daily.   atorvastatin  (LIPITOR) 40 MG tablet TAKE 1 TABLET BY MOUTH DAILY   Azelastine   HCl 137 MCG/SPRAY SOLN PLACE 2 SPRAYS INTO BOTH NOSTRILS 2 (TWO) TIMES DAILY AS DIRECTED   Bromfenac  Sodium (PROLENSA ) 0.07 % SOLN Place 1 drop into the left eye daily.   clopidogrel  (PLAVIX ) 75 MG tablet TAKE 1 TABLET BY MOUTH DAILY   dorzolamide  (TRUSOPT ) 2 % ophthalmic solution Place 1 drop into the left eye daily.   fluticasone  (FLONASE ) 50 MCG/ACT nasal spray Place 2 sprays into both nostrils daily as needed for allergies.   loratadine  (CLARITIN ) 10 MG tablet Take 10 mg by mouth daily as needed for allergies.   LORazepam  (ATIVAN ) 1 MG tablet Take 1 tablet (1 mg total) by mouth 3 (three) times daily as needed.   prednisoLONE  acetate (PRED FORTE ) 1 % ophthalmic suspension INSTILL 1 DROP INTO LEFT EYE EVERY DAY   Pseudoeph-Doxylamine-DM-APAP (NYQUIL PO) Take 1 Dose by mouth at bedtime as needed (congestion).   ramipril  (ALTACE ) 10 MG capsule TAKE 1 CAPSULE BY MOUTH DAILY   zolpidem  (AMBIEN ) 10 MG tablet TAKE ONE-HALF TO ONE TABLET BY MOUTH AT BEDTIME AS NEEDED FOR SLEEP   HYDROcodone -acetaminophen  (NORCO/VICODIN) 5-325 MG tablet Take 1 tablet by mouth every 6 (six) hours as needed.   No facility-administered medications prior to visit.    Review of Systems  Constitutional:  Negative for appetite change, chills and fever.  Respiratory:  Negative for chest tightness, shortness of breath and wheezing.   Cardiovascular:  Negative for chest pain and palpitations.  Gastrointestinal:  Negative for abdominal pain, nausea and vomiting.      Objective    BP 134/60   Pulse (!) 55   Resp 14   Ht 5' 8 (1.727 m)   Wt 177 lb 4.8 oz (80.4 kg)   SpO2 99%   BMI 26.96 kg/m    Physical Exam  General Appearance:    Well developed, well nourished male. Alert, cooperative, in no acute distress, appears stated age  Head:    Normocephalic, without obvious abnormality, atraumatic  Eyes:    PERRL, conjunctiva/corneas clear, EOM's intact, fundi    benign, both eyes       Ears:    Normal TM's and  external ear canals, both ears  Nose:   Nares normal, septum midline, mucosa normal, no drainage   or sinus tenderness  Throat:   Lips, mucosa, and tongue normal; teeth and gums normal  Neck:   Supple, symmetrical, trachea midline, no adenopathy;       thyroid:  No enlargement/tenderness/nodules; no carotid   bruit or JVD  Back:     Symmetric, no curvature, ROM normal, no CVA tenderness  Lungs:     Clear to auscultation bilaterally, respirations unlabored  Chest wall:    No tenderness or deformity  Heart:    Bradycardic. Normal rhythm. No murmurs, rubs, or gallops.  S1 and S2 normal  Abdomen:     Soft, non-tender, bowel sounds active all four quadrants,    no masses, no organomegaly  Genitalia:    deferred  Rectal:    deferred  Extremities:   All extremities are intact. No cyanosis or edema  Pulses:   2+ and symmetric all extremities  Skin:   Skin color, texture, turgor normal, no rashes/ Numerous Sks scattered across back.   Lymph nodes:   Cervical, supraclavicular, and axillary nodes normal  Neurologic:   CNII-XII intact. Normal strength, sensation and reflexes      throughout     Assessment & Plan    Routine Health Maintenance and Physical Exam  Exercise Activities and Dietary recommendations  Goals   None     Immunization History  Administered Date(s) Administered   Fluad Quad(high Dose 65+) 12/11/2018, 04/08/2020   INFLUENZA, HIGH DOSE SEASONAL PF 11/30/2016, 02/12/2018   Pneumococcal Conjugate-13 10/23/2013   Pneumococcal Polysaccharide-23 10/27/2014   Td 11/26/2015   Tdap 06/20/2005   Zoster, Live 09/02/2009    Health Maintenance  Topic Date Due   COVID-19 Vaccine (1) Never done   Zoster Vaccines- Shingrix  (1 of 2) 03/22/1967   Medicare Annual Wellness (AWV)  12/27/2023   Influenza Vaccine  07/09/2024 (Originally 11/10/2023)   DTaP/Tdap/Td (3 - Td or Tdap) 11/25/2025   Colonoscopy  03/16/2026   Pneumococcal Vaccine: 50+ Years  Completed   Hepatitis C  Screening  Completed   HPV VACCINES  Aged Out   Meningococcal B Vaccine  Aged Out    Discussed health benefits of physical activity, and encouraged him to engage in regular exercise appropriate for his age and condition.  2.  Prostate cancer screening  - PSA Total (Reflex To Free)  3. Essential (primary) hypertension Well controlled.  Continue current medications.    4. Prediabetes  - Hemoglobin A1c  5. Hyperlipidemia, unspecified hyperlipidemia type .He is tolerating atorvastatin  well with no adverse effects.   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 Compliant with medication.  Continue aggressive risk factor modification.    7. 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  Dispense: 120 tablet; Refill: 0   Return in about 1 year (around 01/02/2025) for Yearly Physical.  Nancyann Perry, MD  [facility] [phone] (phone) [phone] (fax)  [facility]

## 2024-01-03 NOTE — Progress Notes (Signed)
 Annual Wellness Visit     Patient: Garrett Green, Male    DOB: [birth year], 78 y.o.   MRN: [id number] Visit Date: 01/03/2024  Today's Provider: Nancyann Perry, MD   Chief Complaint  Patient presents with   Annual Exam    Sleep pattern: 7-8 hours, wakes up 1 time to use restroom Exercising: daily busy most of the time no specific regimen No concerns   Subjective    Garrett Green is a 78 y.o. male who presents today for his Annual Wellness Visit.   Medications: Outpatient Medications Prior to Visit  Medication Sig   amLODipine  (NORVASC ) 2.5 MG tablet TAKE 1 TABLET BY MOUTH DAILY   aspirin  325 MG tablet Take 325 mg by mouth daily.   atorvastatin  (LIPITOR) 40 MG tablet TAKE 1 TABLET BY MOUTH DAILY   Azelastine  HCl 137 MCG/SPRAY SOLN PLACE 2 SPRAYS INTO BOTH NOSTRILS 2 (TWO) TIMES DAILY AS DIRECTED   Bromfenac  Sodium (PROLENSA ) 0.07 % SOLN Place 1 drop into the left eye daily.   clopidogrel  (PLAVIX ) 75 MG tablet TAKE 1 TABLET BY MOUTH DAILY   dorzolamide  (TRUSOPT ) 2 % ophthalmic solution Place 1 drop into the left eye daily.   fluticasone  (FLONASE ) 50 MCG/ACT nasal spray Place 2 sprays into both nostrils daily as needed for allergies.   loratadine  (CLARITIN ) 10 MG tablet Take 10 mg by mouth daily as needed for allergies.   LORazepam  (ATIVAN ) 1 MG tablet Take 1 tablet (1 mg total) by mouth 3 (three) times daily as needed.   prednisoLONE  acetate (PRED FORTE ) 1 % ophthalmic suspension INSTILL 1 DROP INTO LEFT EYE EVERY DAY   Pseudoeph-Doxylamine-DM-APAP (NYQUIL PO) Take 1 Dose by mouth at bedtime as needed (congestion).   ramipril  (ALTACE ) 10 MG capsule TAKE 1 CAPSULE BY MOUTH DAILY   zolpidem  (AMBIEN ) 10 MG tablet TAKE ONE-HALF TO ONE TABLET BY MOUTH AT BEDTIME AS NEEDED FOR SLEEP   HYDROcodone -acetaminophen  (NORCO/VICODIN) 5-325 MG tablet Take 1 tablet by mouth every 6 (six) hours as needed.   No facility-administered medications prior to visit.    Allergies  Allergen  Reactions   Sulfa Antibiotics Other (See Comments)    Lethargic and hypotension   Clavulanic Acid Diarrhea    Patient Care Team: Perry Nancyann BRAVO, MD as PCP - General (Family Medicine) Oh, Deward GRADE, MD (Inactive) as Consulting Physician (Gastroenterology) Tobie Baptist, MD as Consulting Physician (Ophthalmology) Rosemarie Eather RAMAN, MD as Consulting Physician (Neurology) Whitfield Raisin, NP as Nurse Practitioner (Neurology) Dolphus Carrion, MD as Consulting Physician (Interventional Radiology) Jackquline Sawyer, MD (Dermatology)    Objective     Most recent functional status assessment:    01/03/2024    9:31 AM  In your present state of health, do you have any difficulty performing the following activities:  Hearing? 0  Vision? 0  Difficulty concentrating or making decisions? 0  Walking or climbing stairs? 0  Dressing or bathing? 0  Doing errands, shopping? 0   Most recent fall risk assessment:    01/03/2024    9:31 AM  Fall Risk   Falls in the past year? 0    Most recent depression screenings:    01/03/2024    8:43 AM 06/23/2021    8:14 AM  PHQ 2/9 Scores  PHQ - 2 Score 0 0  PHQ- 9 Score 0 2   Most recent cognitive screening:    01/03/2024    9:30 AM  6CIT Screen  What Year? 0 points  What  month? 0 points  What time? 0 points  Count back from 20 0 points  Months in reverse 0 points  Repeat phrase 0 points  Total Score 0 points   Most recent Audit-C alcohol use screening    01/01/2024    8:44 PM  Alcohol Use Disorder Test (AUDIT)  1. How often do you have a drink containing alcohol? 2  2. How many drinks containing alcohol do you have on a typical day when you are drinking? 0  3. How often do you have six or more drinks on one occasion? 0  AUDIT-C Score 2      Patient-reported   A score of 3 or more in women, and 4 or more in men indicates increased risk for alcohol abuse, EXCEPT if all of the points are from question 1     Assessment & Plan      Annual wellness visit done today including the all of the following: Reviewed patient's Family Medical History Reviewed and updated list of patient's medical providers Assessment of cognitive impairment was done Assessed patient's functional ability Established a written schedule for health screening services Health Risk Assessent Completed and Reviewed  Exercise Activities and Dietary recommendations  Goals   None     Immunization History  Administered Date(s) Administered   Fluad Quad(high Dose 65+) 12/11/2018, 04/08/2020   INFLUENZA, HIGH DOSE SEASONAL PF 11/30/2016, 02/12/2018   Pneumococcal Conjugate-13 10/23/2013   Pneumococcal Polysaccharide-23 10/27/2014   Td 11/26/2015   Tdap 06/20/2005   Zoster, Live 09/02/2009    Health Maintenance  Topic Date Due   COVID-19 Vaccine (1) Never done   Zoster Vaccines- Shingrix  (1 of 2) 03/22/1967   Influenza Vaccine  07/09/2024 (Originally 11/10/2023)   Medicare Annual Wellness (AWV)  01/02/2025   DTaP/Tdap/Td (3 - Td or Tdap) 11/25/2025   Colonoscopy  03/16/2026   Pneumococcal Vaccine: 50+ Years  Completed   Hepatitis C Screening  Completed   HPV VACCINES  Aged Out   Meningococcal B Vaccine  Aged Out     Discussed health benefits of physical activity, and encouraged him to engage in regular exercise appropriate for his age and condition.     Return in about 1 year (around 01/02/2025) for Yearly Physical.        Nancyann Perry, MD  [facility] [phone] (phone) [phone] (fax)  [facility]

## 2024-01-03 NOTE — Patient Instructions (Signed)
 Please review the attached list of medications and notify my office if there are any errors.   I strongly recommend two doses of Shingrix (the shingles vaccine) separated by 2 to 6 months for adults age 76 years and older. I recommend checking with your insurance plan regarding coverage for this vaccine.

## 2024-01-04 ENCOUNTER — Ambulatory Visit: Payer: Self-pay | Admitting: Family Medicine

## 2024-01-04 DIAGNOSIS — E538 Deficiency of other specified B group vitamins: Secondary | ICD-10-CM | POA: Insufficient documentation

## 2024-01-04 LAB — CBC
Hematocrit: 43.5 % (ref 37.5–51.0)
Hemoglobin: 14.3 g/dL (ref 13.0–17.7)
MCH: 30.8 pg (ref 26.6–33.0)
MCHC: 32.9 g/dL (ref 31.5–35.7)
MCV: 94 fL (ref 79–97)
Platelets: 181 x10E3/uL (ref 150–450)
RBC: 4.64 x10E6/uL (ref 4.14–5.80)
RDW: 11.8 % (ref 11.6–15.4)
WBC: 7.7 x10E3/uL (ref 3.4–10.8)

## 2024-01-04 LAB — COMPREHENSIVE METABOLIC PANEL WITH GFR
ALT: 19 IU/L (ref 0–44)
AST: 22 IU/L (ref 0–40)
Albumin: 4.4 g/dL (ref 3.8–4.8)
Alkaline Phosphatase: 76 IU/L (ref 47–123)
BUN/Creatinine Ratio: 15 (ref 10–24)
BUN: 18 mg/dL (ref 8–27)
Bilirubin Total: 0.8 mg/dL (ref 0.0–1.2)
CO2: 22 mmol/L (ref 20–29)
Calcium: 9.4 mg/dL (ref 8.6–10.2)
Chloride: 106 mmol/L (ref 96–106)
Creatinine, Ser: 1.19 mg/dL (ref 0.76–1.27)
Globulin, Total: 2.2 g/dL (ref 1.5–4.5)
Glucose: 129 mg/dL — ABNORMAL HIGH (ref 70–99)
Potassium: 4.7 mmol/L (ref 3.5–5.2)
Sodium: 141 mmol/L (ref 134–144)
Total Protein: 6.6 g/dL (ref 6.0–8.5)
eGFR: 64 mL/min/1.73 (ref >59)

## 2024-01-04 LAB — HEMOGLOBIN A1C
Est. average glucose Bld gHb Est-mCnc: 123 mg/dL
Hgb A1c MFr Bld: 5.9 % — ABNORMAL HIGH (ref 4.8–5.6)

## 2024-01-04 LAB — VITAMIN B12: Vitamin B-12: 209 pg/mL — ABNORMAL LOW (ref 232–1245)

## 2024-01-04 LAB — LIPID PANEL
Chol/HDL Ratio: 2.3 ratio (ref 0.0–5.0)
Cholesterol, Total: 117 mg/dL (ref 100–199)
HDL: 50 mg/dL (ref >39)
LDL Chol Calc (NIH): 53 mg/dL (ref 0–99)
Triglycerides: 63 mg/dL (ref 0–149)
VLDL Cholesterol Cal: 14 mg/dL (ref 5–40)

## 2024-01-04 LAB — PSA TOTAL (REFLEX TO FREE): Prostate Specific Ag, Serum: 0.6 ng/mL (ref 0.0–4.0)

## 2024-01-22 ENCOUNTER — Encounter: Payer: Medicare Other | Admitting: Family Medicine

## 2024-01-31 ENCOUNTER — Other Ambulatory Visit: Payer: Self-pay | Admitting: Family Medicine

## 2024-01-31 DIAGNOSIS — G4700 Insomnia, unspecified: Secondary | ICD-10-CM

## 2024-03-02 ENCOUNTER — Other Ambulatory Visit: Payer: Self-pay | Admitting: Family Medicine

## 2024-03-02 DIAGNOSIS — J3089 Other allergic rhinitis: Secondary | ICD-10-CM

## 2024-03-10 ENCOUNTER — Other Ambulatory Visit: Payer: Self-pay | Admitting: Family Medicine

## 2024-03-11 ENCOUNTER — Telehealth: Payer: Self-pay | Admitting: Family Medicine

## 2024-03-11 DIAGNOSIS — M47816 Spondylosis without myelopathy or radiculopathy, lumbar region: Secondary | ICD-10-CM

## 2024-03-11 NOTE — Telephone Encounter (Unsigned)
 Copied from CRM #[id number]. Topic: Clinical - Medication Refill >> Mar 11, 2024 12:45 PM Zebedee SAUNDERS wrote: Medication: HYDROcodone -acetaminophen  (NORCO/VICODIN) 5-325 MG tablet  Has the patient contacted their pharmacy? Yes (Agent: If no, request that the patient contact the pharmacy for the refill. If patient does not wish to contact the pharmacy document the reason why and proceed with request.) (Agent: If yes, when and what did the pharmacy advise?)  This is the patient's preferred pharmacy:  [id number] - [address] [place] [address] Phone: [phone] Fax: [phone]  Is this the correct pharmacy for this prescription? Yes If no, delete pharmacy and type the correct one.   Has the prescription been filled recently? Yes  Is the patient out of the medication? Yes  Has the patient been seen for an appointment in the last year OR does the patient have an upcoming appointment? Yes  Can we respond through MyChart? Yes  Agent: Please be advised that Rx refills may take up to 3 business days. We ask that you follow-up with your pharmacy.

## 2024-03-14 MED ORDER — HYDROCODONE-ACETAMINOPHEN 5-325 MG PO TABS: 1.0000 | ORAL_TABLET | Freq: Four times a day (QID) | ORAL | 0 refills | Status: AC | PRN

## 2024-03-14 NOTE — Telephone Encounter (Signed)
 Requested medications are due for refill today.  yes  Requested medications are on the active medications list.  yes  Last refill. 01/03/2024 #120 0 rf  Future visit scheduled.   yes  Notes to clinic.  Refill not delegated.    Requested Prescriptions  Pending Prescriptions Disp Refills   HYDROcodone -acetaminophen  (NORCO/VICODIN) 5-325 MG tablet 120 tablet 0    Sig: Take 1 tablet by mouth every 6 (six) hours as needed.     Not Delegated - Analgesics:  Opioid Agonist Combinations Failed - 03/14/2024  1:54 PM      Failed - This refill cannot be delegated      Failed - Urine Drug Screen completed in last 360 days      Passed - Valid encounter within last 3 months    Recent Outpatient Visits           2 months ago Annual physical exam   [facility] Gasper Nancyann BRAVO, MD       Future Appointments             In 4 months Hester Alm BROCKS, MD [facility]

## 2024-03-15 ENCOUNTER — Ambulatory Visit: Admit: 2024-03-15 | Admitting: Podiatry

## 2024-03-15 SURGERY — EXCISION, GANGLION CYST, FOOT
Anesthesia: Choice | Laterality: Left

## 2024-04-12 NOTE — Progress Notes (Signed)
 " [facility] - Clinic Note  04/17/2024     CHIEF COMPLAINT Patient presents for Retina Follow Up  HISTORY OF PRESENT ILLNESS: Garrett Green is a 79 y.o. male who presents to the clinic today for:   HPI     Retina Follow Up   In left eye.  This started 7 months ago.  Duration of 7 months.  Since onset it is stable.  I, the attending physician,  performed the HPI with the patient and updated documentation appropriately.        Comments   7 month retina follow up CME OS pt is reporting some flashes at times along with some floaters pt is using PF and Prolensa  qdaily OS  dorzolamide  BID OU       Last edited by Valdemar Rogue, MD on 04/18/2024  9:56 PM.     Pt states OU waters excessively, has used multiple gtts and ointments but when he starts using his rx gtts, the eyes dry out.   Referring physician: Portia Fireman, OD [address] [place],  [place] [address]  HISTORICAL INFORMATION:   Selected notes from the MEDICAL RECORD NUMBER Referred by Dr. Portia   CURRENT MEDICATIONS: Current Outpatient Medications (Ophthalmic Drugs)  Medication Sig   Bromfenac  Sodium (PROLENSA ) 0.07 % SOLN Place 1 drop into the left eye daily.   dorzolamide  (TRUSOPT ) 2 % ophthalmic solution Place 1 drop into the left eye daily.   prednisoLONE  acetate (PRED FORTE ) 1 % ophthalmic suspension INSTILL 1 DROP INTO LEFT EYE EVERY DAY   No current facility-administered medications for this visit. (Ophthalmic Drugs)   Current Outpatient Medications (Other)  Medication Sig   amLODipine  (NORVASC ) 2.5 MG tablet TAKE 1 TABLET BY MOUTH DAILY   aspirin  325 MG tablet Take 325 mg by mouth daily.   atorvastatin  (LIPITOR) 40 MG tablet TAKE 1 TABLET BY MOUTH DAILY   Azelastine  HCl 137 MCG/SPRAY SOLN PLACE 2 SPRAYS INTO BOTH NOSTRILS 2 (TWO) TIMES DAILY AS DIRECTED   clopidogrel  (PLAVIX ) 75 MG tablet TAKE 1 TABLET BY MOUTH DAILY   fluticasone  (FLONASE ) 50 MCG/ACT nasal spray Place 2  sprays into both nostrils daily as needed for allergies.   HYDROcodone -acetaminophen  (NORCO/VICODIN) 5-325 MG tablet Take 1 tablet by mouth every 6 (six) hours as needed.   loratadine  (CLARITIN ) 10 MG tablet Take 10 mg by mouth daily as needed for allergies.   LORazepam  (ATIVAN ) 1 MG tablet Take 1 tablet (1 mg total) by mouth 3 (three) times daily as needed.   Pseudoeph-Doxylamine-DM-APAP (NYQUIL PO) Take 1 Dose by mouth at bedtime as needed (congestion).   ramipril  (ALTACE ) 10 MG capsule TAKE 1 CAPSULE BY MOUTH DAILY   zolpidem  (AMBIEN ) 10 MG tablet TAKE 1/2 TO 1 (ONE-HALF TO ONE) TABLET BY MOUTH AT BEDTIME AS NEEDED FOR SLEEP   No current facility-administered medications for this visit. (Other)   REVIEW OF SYSTEMS: ROS   Positive for: Gastrointestinal, Neurological, Musculoskeletal, Cardiovascular, Eyes Negative for: Constitutional, Skin, Genitourinary, HENT, Endocrine, Respiratory, Psychiatric, Allergic/Imm, Heme/Lymph Last edited by Resa Delon ORN, COT on 04/17/2024  8:54 AM.       ALLERGIES Allergies  Allergen Reactions   Sulfa Antibiotics Other (See Comments)    Lethargic and hypotension   Clavulanic Acid Diarrhea   PAST MEDICAL HISTORY Past Medical History:  Diagnosis Date   COVID-19 07/2019   History of blood transfusion 2014   related to back OR   History of chicken pox    Hypertensive  retinopathy    OU   Retinal detachment    OS   Stroke (HCC)    TIA (transient ischemic attack) 10/17/2017; 10/19/2017   Past Surgical History:  Procedure Laterality Date   BACK SURGERY     CATARACT EXTRACTION Right 05/2019   Dr. Lavonia   CATARACT EXTRACTION Left 2017   CATARACT EXTRACTION W/ INTRAOCULAR LENS IMPLANT Left    COLONOSCOPY WITH PROPOFOL  N/A 03/16/2016   Procedure: COLONOSCOPY WITH PROPOFOL ;  Surgeon: Lamar ONEIDA Holmes, MD;  Location: [facility] ENDOSCOPY;  Service: Endoscopy;  Laterality: N/A;   ESOPHAGOGASTRODUODENOSCOPY (EGD) WITH ESOPHAGEAL DILATION  1990s   EYE  SURGERY Bilateral    Cat Sx   IR ANGIO INTRA EXTRACRAN SEL COM CAROTID INNOMINATE BILAT MOD SED  10/31/2017   IR ANGIO INTRA EXTRACRAN SEL COM CAROTID INNOMINATE BILAT MOD SED  01/15/2019   IR ANGIO INTRA EXTRACRAN SEL COM CAROTID INNOMINATE BILAT MOD SED  09/27/2019   IR ANGIO INTRA EXTRACRAN SEL COM CAROTID INNOMINATE BILAT MOD SED  10/29/2021   IR ANGIO VERTEBRAL SEL VERTEBRAL BILAT MOD SED  10/31/2017   IR ANGIO VERTEBRAL SEL VERTEBRAL BILAT MOD SED  01/15/2019   IR ANGIO VERTEBRAL SEL VERTEBRAL BILAT MOD SED  09/27/2019   IR ANGIO VERTEBRAL SEL VERTEBRAL BILAT MOD SED  10/29/2021   IR RADIOLOGIST EVAL & MGMT  10/20/2021   IR US  GUIDE VASC ACCESS RIGHT  01/15/2019   IR US  GUIDE VASC ACCESS RIGHT  09/27/2019   IR US  GUIDE VASC ACCESS RIGHT  10/29/2021   LUMBAR LAMINECTOMY/DECOMPRESSION MICRODISCECTOMY Left 07/26/2012   Procedure: LUMBAR LAMINECTOMY/DECOMPRESSION MICRODISCECTOMY 1 LEVEL;  Surgeon: Alm GORMAN Molt, MD;  Location: [facility]UR[facility];  Service: Neurosurgery;  Laterality: Left;  lumbar five sacral one   RETINAL DETACHMENT SURGERY Left 2016   Dr. Tobie   SHOULDER ARTHROSCOPY WITH OPEN ROTATOR CUFF REPAIR Left 1990s   TONSILLECTOMY     YAG LASER APPLICATION Left 08/2019   Dr. Lavonia   FAMILY HISTORY Family History  Problem Relation Age of Onset   Heart disease Mother    Cancer Sister    Dementia Brother    Dementia Sister    Diabetes Brother    Diabetes Sister    Colon cancer Neg Hx    Prostate cancer Neg Hx    SOCIAL HISTORY Social History   Tobacco Use   Smoking status: Former    Current packs/day: 0.00    Average packs/day: 1 pack/day for 25.0 years (25.0 ttl pk-yrs)    Types: Cigarettes    Start date: 03/04/1963    Quit date: 03/03/1988    Years since quitting: 36.1   Smokeless tobacco: Never  Vaping Use   Vaping status: Never Used  Substance Use Topics   Alcohol use: Yes    Alcohol/week: 1.0 standard drink of alcohol    Types: 1 Standard drinks or equivalent per week    Drug use: Never       OPHTHALMIC EXAM:  Base Eye Exam     Visual Acuity (Snellen - Linear)       Right Left   Dist [place] 20/20 -2 20/60 -3   Dist ph [place]  20/50 -2         Tonometry (Tonopen, 9:01 AM)       Right Left   Pressure 17 19         Pupils       Pupils Dark Light Shape React APD   Right PERRL 2 1 Round  Brisk None   Left PERRL 2 1 Round Brisk None         Visual Fields       Left Right    Full Full         Extraocular Movement       Right Left    Full, Ortho Full, Ortho         Neuro/Psych     Oriented x3: Yes   Mood/Affect: Normal         Dilation     Both eyes: 2.5% Phenylephrine  @ 9:01 AM           Slit Lamp and Fundus Exam     Slit Lamp Exam       Right Left   Lids/Lashes Dermatochalasis - upper lid Dermatochalasis - upper lid   Conjunctiva/Sclera Mild nasal Pinguecula, 1+Injection White and quiet   Cornea Mild arcus, well healed temporal cataract wounds, mild tear film debris, 1-2+ inferior Punctate epithelial erosions Mild arcus, well healed temporal cataract wounds, 2+ fine Punctate epithelial erosions, mild tear film debris   Anterior Chamber deep and clear deep and clear   Iris Round and moderately dilated mild iridodonesis, Round and poorly dilated, mild TIDs temporal midzone   Lens PC IOL in good position, trace PCO PC IOL in good position with open PC   Anterior Vitreous Vitreous syneresis, Posterior vitreous detachment, vitreous condensations, Weiss ring Vitreous syneresis         Fundus Exam       Right Left   Disc Pink and Sharp 2-3+pallor, sharp rim, +PPA, +cupping, very thin superior rim   C/D Ratio 0.6 0.85   Macula Flat, Blunted foveal reflex, ERM, mild RPE mottling and clumping, No heme or edema Blunted foveal reflex, central CME - stably improved, +ERM, RPE mottling and clumping, no heme   Vessels mild attenuation, mild tortuosity mild attenuation, mild tortuosity   Periphery Attached, No heme  limited view due to poor dilation, attached, no heme, inferior paving stone degeneration / CR atrophy           IMAGING AND PROCEDURES  Imaging and Procedures for 04/17/2024  OCT, Retina - OU - Both Eyes       Right Eye Quality was good. Central Foveal Thickness: 401. Progression has been stable. Findings include no IRF, no SRF, abnormal foveal contour, epiretinal membrane, macular pucker (ERM with mild blunted foveal contour and mild pucker -- stable).   Left Eye Quality was good. Central Foveal Thickness: 289. Progression has been stable. Findings include no IRF, no SRF, abnormal foveal contour, epiretinal membrane, macular pucker (Mild ERM with blunted foveal profile and mild pucker; trace cystic changes, no frank CME).   Notes *Images captured and stored on drive  Diagnosis / Impression:  OD: ERM with mild blunted foveal contour and mild pucker-- stable OS: Mild ERM with blunted foveal profile and mild pucker; trace cystic changes, no frank CME  Clinical management:  See below  Abbreviations: NFP - Normal foveal profile. CME - cystoid macular edema. PED - pigment epithelial detachment. IRF - intraretinal fluid. SRF - subretinal fluid. EZ - ellipsoid zone. ERM - epiretinal membrane. ORA - outer retinal atrophy. ORT - outer retinal tubulation. SRHM - subretinal hyper-reflective material. IRHM - intraretinal hyper-reflective material              ASSESSMENT/PLAN:    ICD-10-CM   1. Cystoid macular edema of left eye  H35.352 OCT, Retina - OU - Both Eyes  2. History of retinal detachment  Z86.69     3. Epiretinal membrane (ERM) of both eyes  H35.373     4. Posterior vitreous detachment of right eye  H43.811     5. Essential hypertension  I10     6. Hypertensive retinopathy of both eyes  H35.033     7. Pseudophakia, both eyes  Z96.1     8. Primary open angle glaucoma of both eyes, unspecified glaucoma stage  H40.1130     9. Dry eyes  H04.123       1. CME  OS  - pt with history of complicated CEIOL OS w/ RD in 2016  - history of CME since 2016 previously managed by Dr. Karleen Blanch   - was receiving intravitreal injections per pt report -- last visit w/ Dr. Blanch in January 2021  - started on PF and Prolensa  QID OS here on 09.02.21  - stopped latanoprost  on 12.14.21  - today, BCVA OS 20/50 from 20/60   - OCT shows Mild ERM with blunted foveal profile and mild pucker; trace cystic changes, no frank CME  - history of glaucoma / steroid response and was previously on timolol  qdaily OU, Alphagan  P BID OU, and Latanoprost  qhs OU - pt reports no longer taking timolol  or latanoprost  per Dr. Portia - latanoprost  switched to Vyzulta , but now stopped due to adverse rxn - today, CME remains stably resolved - IOP 19 OS - cont PF and Prolensa  qdaily OS -- recommend low dose maintenance--refills handled by Dr. Portia - cont dorzolamide  BID OU  - f/u 9 months -- DFE, OCT  2. History of RD OS  - 2016 after cataract surgery Anastacio)  - s/p PPV w/ Dr. Blanch in 2016  - retina attached; appears stable  - monitor  3. Epiretinal membrane, both eyes  - mild ERM OU - BCVA OD: 20/20 -- stable; OS: 20/50--improved - asymptomatic, no metamorphopsia - OCT shows mild ERM with interval blunting of foveal contour and mild pucker OU - no indication for surgery at this time - monitor  4. PVD / vitreous syneresis OD  - symptomatic floaters OD -- improved  - Discussed findings and prognosis  - No RT or RD on 360 peripheral exam  - Reviewed s/s of RT/RD  - Strict return precautions for any such RT/RD signs/symptoms   - monitor  5,6. Hypertensive retinopathy OU - discussed importance of tight BP control - monitor  7. Pseudophakia OU  - s/p CE/IOL (OD: Dr. Lavonia, OS: Dr. KYM Fairly, 2016)  - s/p YAG OS -- Bevis, May 2021  - OS with iridonesis and poor dilation - monitor  8. POAG OU  - IOP 17,19 today  - currently on Dorzolamide  BID OU -- cont  - history  of timolol /vyzulta  causing irritating/allergic reactions   9. Dry eyes OU  - using Ivizia BID OU--no longer using  - recommend increasing artificial tears and lubricating ointment as needed  Ophthalmic Meds Ordered this visit:  No orders of the defined types were placed in this encounter.    Return in about 9 months (around 01/15/2025) for CME OS, DFE, OCT.  There are no Patient Instructions on file for this visit.   This document serves as a record of services personally performed by Redell JUDITHANN Hans, MD, PhD. It was created on their behalf by Wanda GEANNIE Keens, COT an ophthalmic technician. The creation of this record is the provider's dictation and/or activities during the visit.  Electronically signed by:  Wanda GEANNIE Keens, COT  04/18/2024 9:58 PM  This document serves as a record of services personally performed by Redell JUDITHANN Hans, MD, PhD. It was created on their behalf by Almetta Pesa, an ophthalmic technician. The creation of this record is the provider's dictation and/or activities during the visit.    Electronically signed by: Almetta Pesa, OA, 04/18/2024  9:58 PM  Redell JUDITHANN Hans, M.D., Ph.D. Diseases & Surgery of the Retina and Vitreous [facility]  I have reviewed the above documentation for accuracy and completeness, and I agree with the above. Redell JUDITHANN Hans, M.D., Ph.D. 04/18/2024 9:59 PM   Abbreviations: M myopia (nearsighted); A astigmatism; H hyperopia (farsighted); P presbyopia; Mrx spectacle prescription;  CTL contact lenses; OD right eye; OS left eye; OU both eyes  XT exotropia; ET esotropia; PEK punctate epithelial keratitis; PEE punctate epithelial erosions; DES dry eye syndrome; MGD meibomian gland dysfunction; ATs artificial tears; PFAT's preservative free artificial tears; NSC nuclear sclerotic cataract; PSC posterior subcapsular cataract; ERM epi-retinal membrane; PVD posterior vitreous detachment; RD retinal detachment; DM  diabetes mellitus; DR diabetic retinopathy; NPDR non-proliferative diabetic retinopathy; PDR proliferative diabetic retinopathy; CSME clinically significant macular edema; DME diabetic macular edema; dbh dot blot hemorrhages; CWS cotton wool spot; POAG primary open angle glaucoma; C/D cup-to-disc ratio; HVF humphrey visual field; GVF goldmann visual field; OCT optical coherence tomography; IOP intraocular pressure; BRVO Branch retinal vein occlusion; CRVO central retinal vein occlusion; CRAO central retinal artery occlusion; BRAO branch retinal artery occlusion; RT retinal tear; SB scleral buckle; PPV pars plana vitrectomy; VH Vitreous hemorrhage; PRP panretinal laser photocoagulation; IVK intravitreal kenalog; VMT vitreomacular traction; MH Macular hole;  NVD neovascularization of the disc; NVE neovascularization elsewhere; AREDS age related eye disease study; ARMD age related macular degeneration; POAG primary open angle glaucoma; EBMD epithelial/anterior basement membrane dystrophy; ACIOL anterior chamber intraocular lens; IOL intraocular lens; PCIOL posterior chamber intraocular lens; Phaco/IOL phacoemulsification with intraocular lens placement; PRK photorefractive keratectomy; LASIK laser assisted in situ keratomileusis; HTN hypertension; DM diabetes mellitus; COPD chronic obstructive pulmonary disease "

## 2024-04-17 ENCOUNTER — Encounter (INDEPENDENT_AMBULATORY_CARE_PROVIDER_SITE_OTHER): Payer: Self-pay | Admitting: Ophthalmology

## 2024-04-17 ENCOUNTER — Ambulatory Visit (INDEPENDENT_AMBULATORY_CARE_PROVIDER_SITE_OTHER): Admitting: Ophthalmology

## 2024-04-17 DIAGNOSIS — H43811 Vitreous degeneration, right eye: Secondary | ICD-10-CM

## 2024-04-17 DIAGNOSIS — H04123 Dry eye syndrome of bilateral lacrimal glands: Secondary | ICD-10-CM | POA: Diagnosis not present

## 2024-04-17 DIAGNOSIS — H35352 Cystoid macular degeneration, left eye: Secondary | ICD-10-CM | POA: Diagnosis not present

## 2024-04-17 DIAGNOSIS — Z8669 Personal history of other diseases of the nervous system and sense organs: Secondary | ICD-10-CM | POA: Diagnosis not present

## 2024-04-17 DIAGNOSIS — I10 Essential (primary) hypertension: Secondary | ICD-10-CM

## 2024-04-17 DIAGNOSIS — Z961 Presence of intraocular lens: Secondary | ICD-10-CM

## 2024-04-17 DIAGNOSIS — H401130 Primary open-angle glaucoma, bilateral, stage unspecified: Secondary | ICD-10-CM

## 2024-04-17 DIAGNOSIS — H35373 Puckering of macula, bilateral: Secondary | ICD-10-CM

## 2024-04-17 DIAGNOSIS — H35033 Hypertensive retinopathy, bilateral: Secondary | ICD-10-CM | POA: Diagnosis not present

## 2024-04-18 ENCOUNTER — Encounter (INDEPENDENT_AMBULATORY_CARE_PROVIDER_SITE_OTHER): Payer: Self-pay | Admitting: Ophthalmology

## 2024-04-24 ENCOUNTER — Other Ambulatory Visit (HOSPITAL_COMMUNITY): Payer: Self-pay | Admitting: Radiology

## 2024-04-24 ENCOUNTER — Encounter (HOSPITAL_COMMUNITY): Payer: Self-pay

## 2024-04-24 DIAGNOSIS — I6381 Other cerebral infarction due to occlusion or stenosis of small artery: Secondary | ICD-10-CM

## 2024-04-24 DIAGNOSIS — I679 Cerebrovascular disease, unspecified: Secondary | ICD-10-CM

## 2024-04-29 ENCOUNTER — Other Ambulatory Visit: Payer: Self-pay | Admitting: Family Medicine

## 2024-04-30 ENCOUNTER — Other Ambulatory Visit: Payer: Self-pay | Admitting: Family Medicine

## 2024-05-08 ENCOUNTER — Ambulatory Visit: Admitting: Neuroradiology

## 2024-05-08 ENCOUNTER — Encounter: Payer: Self-pay | Admitting: Neuroradiology

## 2024-05-08 VITALS — BP 153/82 | HR 77 | Temp 97.3°F | Ht 68.0 in | Wt 183.0 lb

## 2024-05-08 DIAGNOSIS — Z8673 Personal history of transient ischemic attack (TIA), and cerebral infarction without residual deficits: Secondary | ICD-10-CM

## 2024-05-08 DIAGNOSIS — I672 Cerebral atherosclerosis: Secondary | ICD-10-CM

## 2024-05-08 NOTE — Progress Notes (Signed)
 I had the pleasure of meeting Garrett Green and his wife in the office today.  He has been a patient of Dr. Monna since 2019.  I am assuming his care.  He suffered a right hemispheric stroke in 2019.  This caused left-sided weakness that involves the face, arm and leg.  His evaluation at that time showed no extracranial carotid artery stenosis or cardiac cause for his stroke.  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  He was placed on aspirin  and clopidogrel  at that time with medical management of his blood pressure and dyslipidemia.  Since then he has had multiple catheter arteriograms and multiple CT arteriogram and MR arteriogram studies to follow-up on the intracranial atherosclerosis.  He has not had any new symptoms of stroke since the event in 2019.  He does have some residual left-sided weakness, but this has been consistent since 2019 with no episodes of worsening.  He does not have any history of cardiac interventions or coronary disease.  There is no other reason why he would be on clopidogrel  other than the intracranial atherosclerosis.  His last CT arteriogram from 01/18/2023 shows multifocal intracranial atherosclerosis, but no cervical carotid stenosis.  I have reviewed all of his previous arteriograms, CT arteriograms and MR angiograms.  Assessment:  1.  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  This sounds like a capsular stroke. 2.  No new stroke symptoms since 2019 3.  Has been on dual antiplatelet therapy since 2019 without any other indication for dual antiplatelet therapy  Recommendation:  I explained to the patient and his wife that there is no need for intracranial vascular imaging unless he develops new stroke symptoms, as intracranial stenting would not be recommended unless he has new stroke symptoms which are refractory to treatments with medications.  I  have also explained that the recommendation of the American heart Association/American stroke Association after a stroke due to intracranial atherosclerosis would be dual antiplatelet therapy for about 3 months, followed by aspirin  alone.  1.  No further vascular imaging needed unless he develops new stroke symptoms 2.  Stop clopidogrel  and continue with daily aspirin  81 mg 3.  Continue medical treatment of his vascular risk factors of hypertension and dyslipidemia.  I have not scheduled any routine follow-up with me.  I am glad to see him again anytime as needed.  I spent more than 45 minutes with review of his imaging studies alone, with additional time for consultation with the patient and review of my recommendations.

## 2024-05-08 NOTE — Progress Notes (Signed)
 Pt takes bp meds, will contact his PCP about high bp.

## 2024-07-18 ENCOUNTER — Ambulatory Visit: Payer: Medicare Other | Admitting: Dermatology

## 2024-07-22 ENCOUNTER — Ambulatory Visit: Admitting: Dermatology

## 2024-07-31 ENCOUNTER — Ambulatory Visit: Admitting: Dermatology

## 2025-01-03 ENCOUNTER — Encounter: Admitting: Family Medicine

## 2025-01-15 ENCOUNTER — Encounter (INDEPENDENT_AMBULATORY_CARE_PROVIDER_SITE_OTHER): Admitting: Ophthalmology
